# Patient Record
Sex: Female | Born: 1945 | Race: White | Hispanic: No | Smoking: Former smoker
Health system: Southern US, Community
[De-identification: ages and names within clinical notes are randomized; demographics above are authoritative.]

## PROBLEM LIST (undated history)

## (undated) DIAGNOSIS — F419 Anxiety disorder, unspecified: Secondary | ICD-10-CM

## (undated) DIAGNOSIS — Z860100 Personal history of colon polyps, unspecified: Secondary | ICD-10-CM

## (undated) DIAGNOSIS — E119 Type 2 diabetes mellitus without complications: Secondary | ICD-10-CM

## (undated) DIAGNOSIS — J449 Chronic obstructive pulmonary disease, unspecified: Secondary | ICD-10-CM

## (undated) DIAGNOSIS — F329 Major depressive disorder, single episode, unspecified: Secondary | ICD-10-CM

## (undated) DIAGNOSIS — R5383 Other fatigue: Secondary | ICD-10-CM

## (undated) DIAGNOSIS — E039 Hypothyroidism, unspecified: Secondary | ICD-10-CM

## (undated) DIAGNOSIS — R9439 Abnormal result of other cardiovascular function study: Secondary | ICD-10-CM

## (undated) DIAGNOSIS — I10 Essential (primary) hypertension: Secondary | ICD-10-CM

## (undated) DIAGNOSIS — R079 Chest pain, unspecified: Secondary | ICD-10-CM

## (undated) DIAGNOSIS — G459 Transient cerebral ischemic attack, unspecified: Secondary | ICD-10-CM

## (undated) DIAGNOSIS — M1990 Unspecified osteoarthritis, unspecified site: Secondary | ICD-10-CM

## (undated) DIAGNOSIS — J45909 Unspecified asthma, uncomplicated: Secondary | ICD-10-CM

## (undated) DIAGNOSIS — D509 Iron deficiency anemia, unspecified: Secondary | ICD-10-CM

## (undated) DIAGNOSIS — E538 Deficiency of other specified B group vitamins: Secondary | ICD-10-CM

## (undated) DIAGNOSIS — E785 Hyperlipidemia, unspecified: Secondary | ICD-10-CM

## (undated) DIAGNOSIS — K219 Gastro-esophageal reflux disease without esophagitis: Secondary | ICD-10-CM

## (undated) DIAGNOSIS — J309 Allergic rhinitis, unspecified: Secondary | ICD-10-CM

## (undated) DIAGNOSIS — F32A Depression, unspecified: Secondary | ICD-10-CM

## (undated) DIAGNOSIS — J4489 Other specified chronic obstructive pulmonary disease: Secondary | ICD-10-CM

## (undated) DIAGNOSIS — R5381 Other malaise: Secondary | ICD-10-CM

## (undated) HISTORY — DX: Deficiency of other specified B group vitamins: E53.8

## (undated) HISTORY — DX: Personal history of colon polyps, unspecified: Z86.0100

## (undated) HISTORY — DX: Transient cerebral ischemic attack, unspecified: G45.9

## (undated) HISTORY — DX: Anxiety disorder, unspecified: F41.9

## (undated) HISTORY — DX: Other specified chronic obstructive pulmonary disease: J44.89

## (undated) HISTORY — DX: Chronic obstructive pulmonary disease, unspecified: J44.9

## (undated) HISTORY — DX: Gastro-esophageal reflux disease without esophagitis: K21.9

## (undated) HISTORY — DX: Essential (primary) hypertension: I10

## (undated) HISTORY — DX: Hypothyroidism, unspecified: E03.9

## (undated) HISTORY — DX: Unspecified osteoarthritis, unspecified site: M19.90

## (undated) HISTORY — DX: Chest pain, unspecified: R07.9

## (undated) HISTORY — PX: FOOT SURGERY: SHX648

## (undated) HISTORY — DX: Hyperlipidemia, unspecified: E78.5

## (undated) HISTORY — DX: Depression, unspecified: F32.A

## (undated) HISTORY — DX: Type 2 diabetes mellitus without complications: E11.9

## (undated) HISTORY — DX: Personal history of colonic polyps: Z86.010

## (undated) HISTORY — DX: Other malaise: R53.81

## (undated) HISTORY — DX: Abnormal result of other cardiovascular function study: R94.39

## (undated) HISTORY — DX: Allergic rhinitis, unspecified: J30.9

## (undated) HISTORY — DX: Iron deficiency anemia, unspecified: D50.9

## (undated) HISTORY — DX: Other fatigue: R53.83

## (undated) HISTORY — DX: Unspecified asthma, uncomplicated: J45.909

## (undated) HISTORY — DX: Major depressive disorder, single episode, unspecified: F32.9

---

## 1980-11-30 HISTORY — PX: ABDOMINAL HYSTERECTOMY: SHX81

## 1998-11-07 ENCOUNTER — Ambulatory Visit (HOSPITAL_COMMUNITY): Admission: RE | Admit: 1998-11-07 | Discharge: 1998-11-07 | Payer: Self-pay | Admitting: Orthopedic Surgery

## 1998-11-07 ENCOUNTER — Encounter: Payer: Self-pay | Admitting: Orthopedic Surgery

## 1999-01-04 ENCOUNTER — Encounter: Payer: Self-pay | Admitting: Family Medicine

## 1999-01-04 ENCOUNTER — Ambulatory Visit: Admission: RE | Admit: 1999-01-04 | Discharge: 1999-01-04 | Payer: Self-pay | Admitting: Family Medicine

## 2000-03-22 ENCOUNTER — Ambulatory Visit (HOSPITAL_BASED_OUTPATIENT_CLINIC_OR_DEPARTMENT_OTHER): Admission: RE | Admit: 2000-03-22 | Discharge: 2000-03-22 | Payer: Self-pay | Admitting: Orthopedic Surgery

## 2001-01-25 ENCOUNTER — Ambulatory Visit (HOSPITAL_COMMUNITY): Admission: RE | Admit: 2001-01-25 | Discharge: 2001-01-25 | Payer: Self-pay

## 2002-06-05 ENCOUNTER — Encounter: Admission: RE | Admit: 2002-06-05 | Discharge: 2002-09-03 | Payer: Self-pay | Admitting: Unknown Physician Specialty

## 2002-09-16 ENCOUNTER — Emergency Department (HOSPITAL_COMMUNITY): Admission: EM | Admit: 2002-09-16 | Discharge: 2002-09-16 | Payer: Self-pay | Admitting: Emergency Medicine

## 2003-04-14 ENCOUNTER — Ambulatory Visit (HOSPITAL_COMMUNITY): Admission: RE | Admit: 2003-04-14 | Discharge: 2003-04-14 | Payer: Self-pay | Admitting: Family Medicine

## 2003-04-14 ENCOUNTER — Encounter: Payer: Self-pay | Admitting: Family Medicine

## 2003-08-04 ENCOUNTER — Ambulatory Visit (HOSPITAL_COMMUNITY): Admission: RE | Admit: 2003-08-04 | Discharge: 2003-08-04 | Payer: Self-pay | Admitting: Internal Medicine

## 2003-08-04 ENCOUNTER — Encounter: Payer: Self-pay | Admitting: Internal Medicine

## 2004-01-02 ENCOUNTER — Ambulatory Visit (HOSPITAL_COMMUNITY): Admission: RE | Admit: 2004-01-02 | Discharge: 2004-01-02 | Payer: Self-pay | Admitting: Family Medicine

## 2004-01-28 ENCOUNTER — Ambulatory Visit (HOSPITAL_BASED_OUTPATIENT_CLINIC_OR_DEPARTMENT_OTHER): Admission: RE | Admit: 2004-01-28 | Discharge: 2004-01-28 | Payer: Self-pay | Admitting: Orthopedic Surgery

## 2004-01-28 ENCOUNTER — Ambulatory Visit (HOSPITAL_COMMUNITY): Admission: RE | Admit: 2004-01-28 | Discharge: 2004-01-28 | Payer: Self-pay | Admitting: Orthopedic Surgery

## 2004-01-31 ENCOUNTER — Inpatient Hospital Stay (HOSPITAL_COMMUNITY): Admission: EM | Admit: 2004-01-31 | Discharge: 2004-02-09 | Payer: Self-pay | Admitting: Emergency Medicine

## 2004-02-04 ENCOUNTER — Encounter: Payer: Self-pay | Admitting: Internal Medicine

## 2004-02-06 ENCOUNTER — Encounter: Payer: Self-pay | Admitting: Internal Medicine

## 2004-08-05 ENCOUNTER — Encounter: Admission: RE | Admit: 2004-08-05 | Discharge: 2004-08-05 | Payer: Self-pay | Admitting: Allergy and Immunology

## 2004-08-19 ENCOUNTER — Ambulatory Visit: Payer: Self-pay | Admitting: Nurse Practitioner

## 2004-09-04 ENCOUNTER — Ambulatory Visit: Payer: Self-pay | Admitting: *Deleted

## 2004-09-07 ENCOUNTER — Emergency Department (HOSPITAL_COMMUNITY): Admission: EM | Admit: 2004-09-07 | Discharge: 2004-09-07 | Payer: Self-pay

## 2004-09-20 ENCOUNTER — Emergency Department (HOSPITAL_COMMUNITY): Admission: EM | Admit: 2004-09-20 | Discharge: 2004-09-20 | Payer: Self-pay | Admitting: Emergency Medicine

## 2004-09-22 ENCOUNTER — Ambulatory Visit: Payer: Self-pay | Admitting: Nurse Practitioner

## 2004-10-19 ENCOUNTER — Emergency Department (HOSPITAL_COMMUNITY): Admission: EM | Admit: 2004-10-19 | Discharge: 2004-10-20 | Payer: Self-pay

## 2004-11-14 ENCOUNTER — Ambulatory Visit (HOSPITAL_COMMUNITY): Admission: RE | Admit: 2004-11-14 | Discharge: 2004-11-14 | Payer: Self-pay | Admitting: Cardiology

## 2005-02-17 ENCOUNTER — Ambulatory Visit: Payer: Self-pay | Admitting: Nurse Practitioner

## 2005-03-18 ENCOUNTER — Ambulatory Visit: Payer: Self-pay | Admitting: Nurse Practitioner

## 2005-05-25 ENCOUNTER — Ambulatory Visit: Payer: Self-pay | Admitting: Nurse Practitioner

## 2005-05-26 ENCOUNTER — Ambulatory Visit (HOSPITAL_COMMUNITY): Admission: RE | Admit: 2005-05-26 | Discharge: 2005-05-26 | Payer: Self-pay | Admitting: Internal Medicine

## 2005-06-05 ENCOUNTER — Ambulatory Visit: Payer: Self-pay | Admitting: Nurse Practitioner

## 2005-08-06 ENCOUNTER — Ambulatory Visit: Payer: Self-pay | Admitting: Nurse Practitioner

## 2005-09-03 ENCOUNTER — Ambulatory Visit: Payer: Self-pay | Admitting: Nurse Practitioner

## 2005-09-29 ENCOUNTER — Ambulatory Visit: Payer: Self-pay | Admitting: Nurse Practitioner

## 2005-10-01 ENCOUNTER — Ambulatory Visit: Payer: Self-pay | Admitting: Nurse Practitioner

## 2005-10-02 ENCOUNTER — Ambulatory Visit (HOSPITAL_COMMUNITY): Admission: RE | Admit: 2005-10-02 | Discharge: 2005-10-02 | Payer: Self-pay | Admitting: Nurse Practitioner

## 2005-10-26 ENCOUNTER — Ambulatory Visit: Payer: Self-pay | Admitting: Family Medicine

## 2005-11-05 ENCOUNTER — Emergency Department (HOSPITAL_COMMUNITY): Admission: EM | Admit: 2005-11-05 | Discharge: 2005-11-05 | Payer: Self-pay | Admitting: Family Medicine

## 2005-11-17 ENCOUNTER — Ambulatory Visit: Payer: Self-pay | Admitting: Internal Medicine

## 2005-12-02 ENCOUNTER — Ambulatory Visit: Payer: Self-pay | Admitting: Nurse Practitioner

## 2005-12-29 ENCOUNTER — Ambulatory Visit: Payer: Self-pay | Admitting: Cardiology

## 2005-12-31 ENCOUNTER — Ambulatory Visit: Payer: Self-pay

## 2006-01-11 ENCOUNTER — Ambulatory Visit: Payer: Self-pay | Admitting: Nurse Practitioner

## 2006-01-20 ENCOUNTER — Ambulatory Visit: Payer: Self-pay | Admitting: Nurse Practitioner

## 2006-03-01 ENCOUNTER — Ambulatory Visit: Payer: Self-pay | Admitting: Nurse Practitioner

## 2006-04-19 ENCOUNTER — Ambulatory Visit: Payer: Self-pay | Admitting: Nurse Practitioner

## 2006-05-21 ENCOUNTER — Ambulatory Visit: Payer: Self-pay | Admitting: Nurse Practitioner

## 2006-06-25 ENCOUNTER — Ambulatory Visit: Payer: Self-pay | Admitting: Nurse Practitioner

## 2006-08-19 ENCOUNTER — Ambulatory Visit: Payer: Self-pay | Admitting: Nurse Practitioner

## 2006-08-24 ENCOUNTER — Ambulatory Visit (HOSPITAL_COMMUNITY): Admission: RE | Admit: 2006-08-24 | Discharge: 2006-08-24 | Payer: Self-pay | Admitting: Family Medicine

## 2006-09-02 ENCOUNTER — Ambulatory Visit: Payer: Self-pay | Admitting: Nurse Practitioner

## 2006-10-05 ENCOUNTER — Ambulatory Visit: Payer: Self-pay | Admitting: Internal Medicine

## 2006-11-02 ENCOUNTER — Ambulatory Visit: Payer: Self-pay | Admitting: Nurse Practitioner

## 2006-12-29 ENCOUNTER — Ambulatory Visit: Payer: Self-pay | Admitting: Nurse Practitioner

## 2007-01-06 ENCOUNTER — Ambulatory Visit: Payer: Self-pay | Admitting: Nurse Practitioner

## 2007-01-27 ENCOUNTER — Encounter (INDEPENDENT_AMBULATORY_CARE_PROVIDER_SITE_OTHER): Payer: Self-pay | Admitting: Nurse Practitioner

## 2007-01-27 ENCOUNTER — Ambulatory Visit: Payer: Self-pay | Admitting: Family Medicine

## 2007-01-27 ENCOUNTER — Other Ambulatory Visit: Admission: RE | Admit: 2007-01-27 | Discharge: 2007-01-27 | Payer: Self-pay | Admitting: Family Medicine

## 2007-05-17 ENCOUNTER — Ambulatory Visit: Payer: Self-pay | Admitting: Internal Medicine

## 2007-06-30 ENCOUNTER — Ambulatory Visit (HOSPITAL_COMMUNITY): Admission: RE | Admit: 2007-06-30 | Discharge: 2007-06-30 | Payer: Self-pay | Admitting: Internal Medicine

## 2007-06-30 ENCOUNTER — Encounter: Payer: Self-pay | Admitting: Internal Medicine

## 2007-07-13 ENCOUNTER — Ambulatory Visit: Payer: Self-pay | Admitting: Family Medicine

## 2007-07-13 ENCOUNTER — Encounter (INDEPENDENT_AMBULATORY_CARE_PROVIDER_SITE_OTHER): Payer: Self-pay | Admitting: Nurse Practitioner

## 2007-07-13 LAB — CONVERTED CEMR LAB
ALT: 16 units/L (ref 0–35)
AST: 20 units/L (ref 0–37)
Basophils Absolute: 0.1 10*3/uL (ref 0.0–0.1)
Basophils Relative: 1 % (ref 0–1)
Chloride: 101 meq/L (ref 96–112)
Creatinine, Ser: 1.08 mg/dL (ref 0.40–1.20)
Eosinophils Relative: 1 % (ref 0–5)
Hemoglobin: 14 g/dL (ref 12.0–15.0)
MCHC: 32.3 g/dL (ref 30.0–36.0)
Monocytes Absolute: 0.6 10*3/uL (ref 0.2–0.7)
Neutro Abs: 4.3 10*3/uL (ref 1.7–7.7)
Platelets: 295 10*3/uL (ref 150–400)
RDW: 13 % (ref 11.5–14.0)
Sodium: 141 meq/L (ref 135–145)
Total Bilirubin: 0.5 mg/dL (ref 0.3–1.2)
Total Protein: 6.8 g/dL (ref 6.0–8.3)

## 2007-07-18 ENCOUNTER — Ambulatory Visit (HOSPITAL_COMMUNITY): Admission: RE | Admit: 2007-07-18 | Discharge: 2007-07-18 | Payer: Self-pay | Admitting: Nurse Practitioner

## 2007-07-28 ENCOUNTER — Ambulatory Visit (HOSPITAL_COMMUNITY): Admission: RE | Admit: 2007-07-28 | Discharge: 2007-07-28 | Payer: Self-pay | Admitting: Internal Medicine

## 2007-07-28 ENCOUNTER — Ambulatory Visit: Payer: Self-pay | Admitting: Internal Medicine

## 2007-08-03 ENCOUNTER — Emergency Department (HOSPITAL_COMMUNITY): Admission: EM | Admit: 2007-08-03 | Discharge: 2007-08-03 | Payer: Self-pay | Admitting: Emergency Medicine

## 2007-08-05 ENCOUNTER — Emergency Department (HOSPITAL_COMMUNITY): Admission: EM | Admit: 2007-08-05 | Discharge: 2007-08-05 | Payer: Self-pay | Admitting: Emergency Medicine

## 2007-08-12 ENCOUNTER — Ambulatory Visit: Payer: Self-pay | Admitting: Internal Medicine

## 2007-08-18 ENCOUNTER — Emergency Department (HOSPITAL_COMMUNITY): Admission: EM | Admit: 2007-08-18 | Discharge: 2007-08-18 | Payer: Self-pay | Admitting: Emergency Medicine

## 2007-09-02 ENCOUNTER — Emergency Department (HOSPITAL_COMMUNITY): Admission: EM | Admit: 2007-09-02 | Discharge: 2007-09-02 | Payer: Self-pay | Admitting: Family Medicine

## 2007-09-12 ENCOUNTER — Ambulatory Visit: Payer: Self-pay | Admitting: Family Medicine

## 2007-09-14 ENCOUNTER — Ambulatory Visit: Payer: Self-pay | Admitting: Internal Medicine

## 2007-10-03 ENCOUNTER — Ambulatory Visit: Payer: Self-pay | Admitting: Internal Medicine

## 2007-10-12 ENCOUNTER — Ambulatory Visit: Payer: Self-pay | Admitting: Internal Medicine

## 2007-10-17 ENCOUNTER — Telehealth (INDEPENDENT_AMBULATORY_CARE_PROVIDER_SITE_OTHER): Payer: Self-pay | Admitting: *Deleted

## 2007-10-18 ENCOUNTER — Ambulatory Visit: Payer: Self-pay | Admitting: Internal Medicine

## 2007-10-20 ENCOUNTER — Ambulatory Visit: Payer: Self-pay | Admitting: Internal Medicine

## 2007-10-21 ENCOUNTER — Ambulatory Visit (HOSPITAL_COMMUNITY): Admission: RE | Admit: 2007-10-21 | Discharge: 2007-10-21 | Payer: Self-pay | Admitting: Internal Medicine

## 2007-11-02 DIAGNOSIS — E1165 Type 2 diabetes mellitus with hyperglycemia: Secondary | ICD-10-CM

## 2007-11-02 DIAGNOSIS — Z9189 Other specified personal risk factors, not elsewhere classified: Secondary | ICD-10-CM

## 2007-11-02 DIAGNOSIS — E039 Hypothyroidism, unspecified: Secondary | ICD-10-CM

## 2007-11-02 DIAGNOSIS — E1129 Type 2 diabetes mellitus with other diabetic kidney complication: Secondary | ICD-10-CM

## 2007-11-02 DIAGNOSIS — J45909 Unspecified asthma, uncomplicated: Secondary | ICD-10-CM | POA: Insufficient documentation

## 2007-11-02 DIAGNOSIS — I10 Essential (primary) hypertension: Secondary | ICD-10-CM

## 2007-11-02 DIAGNOSIS — J309 Allergic rhinitis, unspecified: Secondary | ICD-10-CM | POA: Insufficient documentation

## 2007-11-02 DIAGNOSIS — E785 Hyperlipidemia, unspecified: Secondary | ICD-10-CM | POA: Insufficient documentation

## 2007-11-02 DIAGNOSIS — Z9079 Acquired absence of other genital organ(s): Secondary | ICD-10-CM | POA: Insufficient documentation

## 2007-11-03 ENCOUNTER — Ambulatory Visit: Payer: Self-pay | Admitting: Internal Medicine

## 2007-11-17 ENCOUNTER — Ambulatory Visit: Payer: Self-pay | Admitting: Internal Medicine

## 2007-11-17 DIAGNOSIS — J4489 Other specified chronic obstructive pulmonary disease: Secondary | ICD-10-CM | POA: Insufficient documentation

## 2007-11-17 DIAGNOSIS — J449 Chronic obstructive pulmonary disease, unspecified: Secondary | ICD-10-CM

## 2007-12-06 ENCOUNTER — Telehealth (INDEPENDENT_AMBULATORY_CARE_PROVIDER_SITE_OTHER): Payer: Self-pay | Admitting: *Deleted

## 2007-12-08 ENCOUNTER — Telehealth: Payer: Self-pay | Admitting: Internal Medicine

## 2007-12-09 ENCOUNTER — Ambulatory Visit: Payer: Self-pay | Admitting: Internal Medicine

## 2007-12-13 ENCOUNTER — Encounter: Admission: RE | Admit: 2007-12-13 | Discharge: 2007-12-13 | Payer: Self-pay | Admitting: Internal Medicine

## 2007-12-19 ENCOUNTER — Telehealth: Payer: Self-pay | Admitting: Internal Medicine

## 2008-02-01 ENCOUNTER — Telehealth (INDEPENDENT_AMBULATORY_CARE_PROVIDER_SITE_OTHER): Payer: Self-pay | Admitting: *Deleted

## 2008-02-02 ENCOUNTER — Ambulatory Visit: Payer: Self-pay | Admitting: Internal Medicine

## 2008-02-03 ENCOUNTER — Ambulatory Visit (HOSPITAL_COMMUNITY): Admission: RE | Admit: 2008-02-03 | Discharge: 2008-02-03 | Payer: Self-pay | Admitting: Orthopedic Surgery

## 2008-02-16 ENCOUNTER — Encounter: Payer: Self-pay | Admitting: Internal Medicine

## 2008-02-20 ENCOUNTER — Ambulatory Visit: Payer: Self-pay | Admitting: Internal Medicine

## 2008-03-07 ENCOUNTER — Encounter: Payer: Self-pay | Admitting: Internal Medicine

## 2008-04-10 ENCOUNTER — Ambulatory Visit: Payer: Self-pay | Admitting: Internal Medicine

## 2008-04-10 LAB — CONVERTED CEMR LAB
Cholesterol: 214 mg/dL (ref 0–200)
Direct LDL: 124.1 mg/dL
Total CHOL/HDL Ratio: 6.6
Triglycerides: 298 mg/dL (ref 0–149)
VLDL: 60 mg/dL — ABNORMAL HIGH (ref 0–40)

## 2008-04-11 ENCOUNTER — Ambulatory Visit: Payer: Self-pay | Admitting: Internal Medicine

## 2008-04-11 DIAGNOSIS — M47817 Spondylosis without myelopathy or radiculopathy, lumbosacral region: Secondary | ICD-10-CM

## 2008-04-13 ENCOUNTER — Telehealth: Payer: Self-pay | Admitting: Internal Medicine

## 2008-04-15 ENCOUNTER — Encounter: Payer: Self-pay | Admitting: Internal Medicine

## 2008-04-19 ENCOUNTER — Telehealth: Payer: Self-pay | Admitting: Internal Medicine

## 2008-04-24 ENCOUNTER — Telehealth: Payer: Self-pay | Admitting: Internal Medicine

## 2008-05-02 ENCOUNTER — Encounter: Payer: Self-pay | Admitting: Internal Medicine

## 2008-05-07 ENCOUNTER — Telehealth: Payer: Self-pay | Admitting: Internal Medicine

## 2008-05-09 ENCOUNTER — Telehealth: Payer: Self-pay | Admitting: Internal Medicine

## 2008-05-10 ENCOUNTER — Encounter: Payer: Self-pay | Admitting: Internal Medicine

## 2008-05-11 ENCOUNTER — Encounter: Payer: Self-pay | Admitting: Internal Medicine

## 2008-05-15 ENCOUNTER — Ambulatory Visit: Payer: Self-pay | Admitting: Internal Medicine

## 2008-05-15 DIAGNOSIS — E538 Deficiency of other specified B group vitamins: Secondary | ICD-10-CM | POA: Insufficient documentation

## 2008-05-16 ENCOUNTER — Telehealth: Payer: Self-pay | Admitting: Internal Medicine

## 2008-05-16 ENCOUNTER — Ambulatory Visit: Payer: Self-pay | Admitting: Internal Medicine

## 2008-05-18 ENCOUNTER — Telehealth: Payer: Self-pay | Admitting: Internal Medicine

## 2008-05-23 ENCOUNTER — Ambulatory Visit: Payer: Self-pay | Admitting: Internal Medicine

## 2008-05-24 ENCOUNTER — Telehealth: Payer: Self-pay | Admitting: Adult Health

## 2008-05-25 ENCOUNTER — Encounter: Payer: Self-pay | Admitting: Adult Health

## 2008-05-29 ENCOUNTER — Emergency Department (HOSPITAL_COMMUNITY): Admission: EM | Admit: 2008-05-29 | Discharge: 2008-05-29 | Payer: Self-pay | Admitting: Family Medicine

## 2008-05-29 ENCOUNTER — Telehealth (INDEPENDENT_AMBULATORY_CARE_PROVIDER_SITE_OTHER): Payer: Self-pay | Admitting: *Deleted

## 2008-06-06 ENCOUNTER — Encounter: Payer: Self-pay | Admitting: Internal Medicine

## 2008-06-12 ENCOUNTER — Encounter: Payer: Self-pay | Admitting: Internal Medicine

## 2008-06-15 ENCOUNTER — Ambulatory Visit: Payer: Self-pay | Admitting: Internal Medicine

## 2008-06-27 ENCOUNTER — Telehealth (INDEPENDENT_AMBULATORY_CARE_PROVIDER_SITE_OTHER): Payer: Self-pay | Admitting: *Deleted

## 2008-06-29 ENCOUNTER — Telehealth: Payer: Self-pay | Admitting: Internal Medicine

## 2008-07-11 ENCOUNTER — Ambulatory Visit: Payer: Self-pay | Admitting: Internal Medicine

## 2008-07-12 ENCOUNTER — Ambulatory Visit: Payer: Self-pay | Admitting: Internal Medicine

## 2008-07-12 LAB — CONVERTED CEMR LAB
Basophils Absolute: 0.1 10*3/uL (ref 0.0–0.1)
Basophils Relative: 0.8 % (ref 0.0–3.0)
Eosinophils Absolute: 0.1 10*3/uL (ref 0.0–0.7)
HCT: 36.5 % (ref 36.0–46.0)
Hemoglobin: 12.8 g/dL (ref 12.0–15.0)
Lymphocytes Relative: 28 % (ref 12.0–46.0)
MCHC: 34.9 g/dL (ref 30.0–36.0)
MCV: 88.5 fL (ref 78.0–100.0)
Monocytes Absolute: 0.5 10*3/uL (ref 0.1–1.0)
Neutro Abs: 3.8 10*3/uL (ref 1.4–7.7)
RBC: 4.13 M/uL (ref 3.87–5.11)

## 2008-07-18 ENCOUNTER — Ambulatory Visit: Payer: Self-pay | Admitting: Internal Medicine

## 2008-07-19 ENCOUNTER — Telehealth (INDEPENDENT_AMBULATORY_CARE_PROVIDER_SITE_OTHER): Payer: Self-pay | Admitting: *Deleted

## 2008-07-25 ENCOUNTER — Telehealth: Payer: Self-pay | Admitting: Internal Medicine

## 2008-07-30 ENCOUNTER — Telehealth: Payer: Self-pay | Admitting: Internal Medicine

## 2008-08-02 ENCOUNTER — Telehealth (INDEPENDENT_AMBULATORY_CARE_PROVIDER_SITE_OTHER): Payer: Self-pay | Admitting: *Deleted

## 2008-08-07 ENCOUNTER — Telehealth (INDEPENDENT_AMBULATORY_CARE_PROVIDER_SITE_OTHER): Payer: Self-pay | Admitting: *Deleted

## 2008-08-13 ENCOUNTER — Telehealth: Payer: Self-pay | Admitting: Internal Medicine

## 2008-08-16 ENCOUNTER — Ambulatory Visit: Payer: Self-pay | Admitting: Internal Medicine

## 2008-08-16 ENCOUNTER — Observation Stay (HOSPITAL_COMMUNITY): Admission: EM | Admit: 2008-08-16 | Discharge: 2008-08-17 | Payer: Self-pay | Admitting: Emergency Medicine

## 2008-08-17 ENCOUNTER — Telehealth (INDEPENDENT_AMBULATORY_CARE_PROVIDER_SITE_OTHER): Payer: Self-pay | Admitting: *Deleted

## 2008-08-20 ENCOUNTER — Telehealth: Payer: Self-pay | Admitting: Internal Medicine

## 2008-08-21 ENCOUNTER — Encounter: Payer: Self-pay | Admitting: Internal Medicine

## 2008-08-21 ENCOUNTER — Ambulatory Visit: Payer: Self-pay

## 2008-08-21 DIAGNOSIS — R9439 Abnormal result of other cardiovascular function study: Secondary | ICD-10-CM

## 2008-08-21 HISTORY — DX: Abnormal result of other cardiovascular function study: R94.39

## 2008-08-22 ENCOUNTER — Ambulatory Visit: Payer: Self-pay

## 2008-08-23 ENCOUNTER — Ambulatory Visit: Payer: Self-pay | Admitting: Internal Medicine

## 2008-08-24 ENCOUNTER — Ambulatory Visit: Payer: Self-pay | Admitting: Internal Medicine

## 2008-08-24 DIAGNOSIS — R079 Chest pain, unspecified: Secondary | ICD-10-CM

## 2008-08-27 ENCOUNTER — Telehealth: Payer: Self-pay | Admitting: Internal Medicine

## 2008-08-28 ENCOUNTER — Telehealth: Payer: Self-pay | Admitting: Internal Medicine

## 2008-09-07 ENCOUNTER — Ambulatory Visit: Payer: Self-pay | Admitting: Internal Medicine

## 2008-09-07 LAB — CONVERTED CEMR LAB
BUN: 22 mg/dL (ref 6–23)
Creatinine, Ser: 1 mg/dL (ref 0.4–1.2)
GFR calc Af Amer: 72 mL/min
Glucose, Bld: 187 mg/dL — ABNORMAL HIGH (ref 70–99)
Potassium: 4 meq/L (ref 3.5–5.1)

## 2008-09-08 DIAGNOSIS — F341 Dysthymic disorder: Secondary | ICD-10-CM

## 2008-09-10 ENCOUNTER — Encounter: Payer: Self-pay | Admitting: Internal Medicine

## 2008-09-12 ENCOUNTER — Encounter: Payer: Self-pay | Admitting: Internal Medicine

## 2008-09-13 ENCOUNTER — Ambulatory Visit: Payer: Self-pay | Admitting: Internal Medicine

## 2008-09-14 ENCOUNTER — Telehealth: Payer: Self-pay | Admitting: Internal Medicine

## 2008-09-17 ENCOUNTER — Ambulatory Visit: Payer: Self-pay | Admitting: Internal Medicine

## 2008-09-18 ENCOUNTER — Encounter (HOSPITAL_COMMUNITY): Admission: RE | Admit: 2008-09-18 | Discharge: 2008-11-29 | Payer: Self-pay | Admitting: Internal Medicine

## 2008-09-21 ENCOUNTER — Ambulatory Visit: Payer: Self-pay | Admitting: Internal Medicine

## 2008-09-25 ENCOUNTER — Ambulatory Visit: Payer: Self-pay | Admitting: Internal Medicine

## 2008-09-26 ENCOUNTER — Telehealth: Payer: Self-pay | Admitting: Internal Medicine

## 2008-09-28 ENCOUNTER — Ambulatory Visit: Payer: Self-pay | Admitting: Internal Medicine

## 2008-10-01 ENCOUNTER — Telehealth: Payer: Self-pay | Admitting: Internal Medicine

## 2008-10-02 ENCOUNTER — Ambulatory Visit: Payer: Self-pay | Admitting: Internal Medicine

## 2008-10-05 ENCOUNTER — Ambulatory Visit: Payer: Self-pay | Admitting: Internal Medicine

## 2008-10-09 ENCOUNTER — Ambulatory Visit: Payer: Self-pay | Admitting: Internal Medicine

## 2008-10-14 ENCOUNTER — Encounter: Payer: Self-pay | Admitting: Internal Medicine

## 2008-10-16 ENCOUNTER — Ambulatory Visit: Payer: Self-pay | Admitting: Internal Medicine

## 2008-10-17 ENCOUNTER — Ambulatory Visit: Payer: Self-pay | Admitting: Internal Medicine

## 2008-10-19 ENCOUNTER — Ambulatory Visit: Payer: Self-pay | Admitting: Internal Medicine

## 2008-10-22 ENCOUNTER — Telehealth: Payer: Self-pay | Admitting: Internal Medicine

## 2008-10-22 ENCOUNTER — Ambulatory Visit: Payer: Self-pay | Admitting: Internal Medicine

## 2008-10-23 ENCOUNTER — Ambulatory Visit: Payer: Self-pay | Admitting: Internal Medicine

## 2008-10-24 ENCOUNTER — Telehealth: Payer: Self-pay | Admitting: Internal Medicine

## 2008-10-26 ENCOUNTER — Telehealth (INDEPENDENT_AMBULATORY_CARE_PROVIDER_SITE_OTHER): Payer: Self-pay | Admitting: *Deleted

## 2008-10-26 ENCOUNTER — Ambulatory Visit: Payer: Self-pay | Admitting: Internal Medicine

## 2008-10-30 ENCOUNTER — Ambulatory Visit: Payer: Self-pay | Admitting: Internal Medicine

## 2008-10-31 ENCOUNTER — Ambulatory Visit: Payer: Self-pay | Admitting: Internal Medicine

## 2008-11-02 ENCOUNTER — Ambulatory Visit: Payer: Self-pay | Admitting: Internal Medicine

## 2008-11-05 ENCOUNTER — Ambulatory Visit: Payer: Self-pay | Admitting: Internal Medicine

## 2008-11-06 ENCOUNTER — Ambulatory Visit: Payer: Self-pay | Admitting: Internal Medicine

## 2008-11-07 ENCOUNTER — Telehealth: Payer: Self-pay | Admitting: Internal Medicine

## 2008-11-09 ENCOUNTER — Ambulatory Visit: Payer: Self-pay | Admitting: Internal Medicine

## 2008-11-11 ENCOUNTER — Emergency Department (HOSPITAL_COMMUNITY): Admission: EM | Admit: 2008-11-11 | Discharge: 2008-11-11 | Payer: Self-pay | Admitting: Emergency Medicine

## 2008-11-12 ENCOUNTER — Telehealth: Payer: Self-pay | Admitting: Internal Medicine

## 2008-11-13 ENCOUNTER — Inpatient Hospital Stay (HOSPITAL_COMMUNITY): Admission: EM | Admit: 2008-11-13 | Discharge: 2008-11-16 | Payer: Self-pay | Admitting: Emergency Medicine

## 2008-11-13 ENCOUNTER — Ambulatory Visit: Payer: Self-pay | Admitting: Internal Medicine

## 2008-11-15 ENCOUNTER — Telehealth: Payer: Self-pay | Admitting: Internal Medicine

## 2008-11-19 ENCOUNTER — Telehealth: Payer: Self-pay | Admitting: Internal Medicine

## 2008-11-20 ENCOUNTER — Ambulatory Visit: Payer: Self-pay | Admitting: Internal Medicine

## 2008-11-27 ENCOUNTER — Ambulatory Visit: Payer: Self-pay | Admitting: Internal Medicine

## 2008-11-29 ENCOUNTER — Ambulatory Visit: Payer: Self-pay | Admitting: Internal Medicine

## 2008-11-30 ENCOUNTER — Encounter (HOSPITAL_COMMUNITY): Admission: RE | Admit: 2008-11-30 | Discharge: 2009-01-16 | Payer: Self-pay | Admitting: Internal Medicine

## 2008-12-03 ENCOUNTER — Ambulatory Visit: Payer: Self-pay | Admitting: Internal Medicine

## 2008-12-04 ENCOUNTER — Ambulatory Visit: Payer: Self-pay | Admitting: Cardiology

## 2008-12-06 ENCOUNTER — Ambulatory Visit: Payer: Self-pay | Admitting: Internal Medicine

## 2008-12-06 ENCOUNTER — Telehealth: Payer: Self-pay | Admitting: Internal Medicine

## 2008-12-10 ENCOUNTER — Encounter: Payer: Self-pay | Admitting: Internal Medicine

## 2008-12-11 ENCOUNTER — Telehealth: Payer: Self-pay | Admitting: Internal Medicine

## 2008-12-11 ENCOUNTER — Ambulatory Visit: Payer: Self-pay | Admitting: Internal Medicine

## 2008-12-12 ENCOUNTER — Encounter: Payer: Self-pay | Admitting: Internal Medicine

## 2008-12-14 ENCOUNTER — Ambulatory Visit: Payer: Self-pay | Admitting: Internal Medicine

## 2008-12-18 ENCOUNTER — Encounter: Payer: Self-pay | Admitting: Internal Medicine

## 2008-12-18 ENCOUNTER — Ambulatory Visit: Payer: Self-pay | Admitting: Internal Medicine

## 2008-12-25 ENCOUNTER — Ambulatory Visit: Payer: Self-pay | Admitting: Internal Medicine

## 2008-12-27 ENCOUNTER — Ambulatory Visit: Payer: Self-pay | Admitting: Internal Medicine

## 2008-12-27 DIAGNOSIS — R5383 Other fatigue: Secondary | ICD-10-CM

## 2008-12-27 DIAGNOSIS — R5381 Other malaise: Secondary | ICD-10-CM | POA: Insufficient documentation

## 2008-12-27 LAB — CONVERTED CEMR LAB
Basophils Absolute: 0 10*3/uL (ref 0.0–0.1)
Basophils Relative: 0.5 % (ref 0.0–3.0)
CO2: 30 meq/L (ref 19–32)
Chloride: 102 meq/L (ref 96–112)
Cholesterol: 142 mg/dL (ref 0–200)
Hgb A1c MFr Bld: 7.6 % — ABNORMAL HIGH (ref 4.6–6.0)
LDL Cholesterol: 70 mg/dL (ref 0–99)
Lymphocytes Relative: 27.6 % (ref 12.0–46.0)
MCHC: 34 g/dL (ref 30.0–36.0)
Neutrophils Relative %: 63.5 % (ref 43.0–77.0)
RBC: 4.19 M/uL (ref 3.87–5.11)
RDW: 13.4 % (ref 11.5–14.6)
Sodium: 141 meq/L (ref 135–145)
TSH: 6.65 microintl units/mL — ABNORMAL HIGH (ref 0.35–5.50)
VLDL: 33 mg/dL (ref 0–40)

## 2009-01-01 ENCOUNTER — Ambulatory Visit: Payer: Self-pay | Admitting: Internal Medicine

## 2009-01-02 ENCOUNTER — Telehealth: Payer: Self-pay | Admitting: Internal Medicine

## 2009-01-04 ENCOUNTER — Ambulatory Visit: Payer: Self-pay | Admitting: Internal Medicine

## 2009-01-10 ENCOUNTER — Encounter: Payer: Self-pay | Admitting: Internal Medicine

## 2009-01-10 ENCOUNTER — Ambulatory Visit: Payer: Self-pay | Admitting: Internal Medicine

## 2009-01-14 ENCOUNTER — Ambulatory Visit: Payer: Self-pay | Admitting: Internal Medicine

## 2009-01-15 ENCOUNTER — Ambulatory Visit: Payer: Self-pay | Admitting: Internal Medicine

## 2009-01-16 ENCOUNTER — Encounter (HOSPITAL_COMMUNITY): Admission: RE | Admit: 2009-01-16 | Discharge: 2009-04-16 | Payer: Self-pay | Admitting: Internal Medicine

## 2009-01-18 ENCOUNTER — Ambulatory Visit: Payer: Self-pay | Admitting: Internal Medicine

## 2009-01-22 ENCOUNTER — Ambulatory Visit: Payer: Self-pay | Admitting: Internal Medicine

## 2009-01-24 ENCOUNTER — Ambulatory Visit: Payer: Self-pay | Admitting: Internal Medicine

## 2009-01-29 ENCOUNTER — Ambulatory Visit: Payer: Self-pay | Admitting: Internal Medicine

## 2009-02-05 ENCOUNTER — Ambulatory Visit: Payer: Self-pay | Admitting: Internal Medicine

## 2009-02-08 ENCOUNTER — Encounter: Payer: Self-pay | Admitting: Internal Medicine

## 2009-02-12 ENCOUNTER — Ambulatory Visit: Payer: Self-pay | Admitting: Internal Medicine

## 2009-02-13 ENCOUNTER — Encounter: Payer: Self-pay | Admitting: Internal Medicine

## 2009-02-14 ENCOUNTER — Emergency Department (HOSPITAL_COMMUNITY): Admission: EM | Admit: 2009-02-14 | Discharge: 2009-02-14 | Payer: Self-pay | Admitting: Emergency Medicine

## 2009-02-14 ENCOUNTER — Telehealth (INDEPENDENT_AMBULATORY_CARE_PROVIDER_SITE_OTHER): Payer: Self-pay | Admitting: *Deleted

## 2009-02-19 ENCOUNTER — Ambulatory Visit: Payer: Self-pay | Admitting: Internal Medicine

## 2009-02-26 ENCOUNTER — Encounter: Payer: Self-pay | Admitting: Internal Medicine

## 2009-02-26 ENCOUNTER — Ambulatory Visit: Payer: Self-pay | Admitting: Internal Medicine

## 2009-03-05 ENCOUNTER — Ambulatory Visit (HOSPITAL_BASED_OUTPATIENT_CLINIC_OR_DEPARTMENT_OTHER): Admission: RE | Admit: 2009-03-05 | Discharge: 2009-03-05 | Payer: Self-pay | Admitting: Orthopedic Surgery

## 2009-03-05 HISTORY — PX: CARPAL TUNNEL RELEASE: SHX101

## 2009-03-12 ENCOUNTER — Ambulatory Visit: Payer: Self-pay | Admitting: Internal Medicine

## 2009-03-19 ENCOUNTER — Encounter: Payer: Self-pay | Admitting: Internal Medicine

## 2009-03-20 ENCOUNTER — Ambulatory Visit: Payer: Self-pay | Admitting: Internal Medicine

## 2009-04-02 ENCOUNTER — Ambulatory Visit: Payer: Self-pay | Admitting: Internal Medicine

## 2009-04-08 ENCOUNTER — Telehealth: Payer: Self-pay | Admitting: Internal Medicine

## 2009-04-08 ENCOUNTER — Telehealth (INDEPENDENT_AMBULATORY_CARE_PROVIDER_SITE_OTHER): Payer: Self-pay | Admitting: *Deleted

## 2009-04-10 ENCOUNTER — Ambulatory Visit: Payer: Self-pay | Admitting: Internal Medicine

## 2009-04-15 ENCOUNTER — Ambulatory Visit: Payer: Self-pay | Admitting: Internal Medicine

## 2009-04-17 ENCOUNTER — Ambulatory Visit: Payer: Self-pay | Admitting: Internal Medicine

## 2009-04-19 LAB — CONVERTED CEMR LAB
Basophils Absolute: 0 10*3/uL (ref 0.0–0.1)
Calcium: 9.3 mg/dL (ref 8.4–10.5)
Cholesterol: 145 mg/dL (ref 0–200)
Creatinine, Ser: 0.8 mg/dL (ref 0.4–1.2)
GFR calc non Af Amer: 76.46 mL/min (ref >60)
Glucose, Bld: 140 mg/dL — ABNORMAL HIGH (ref 70–99)
HCT: 33.9 % — ABNORMAL LOW (ref 36.0–46.0)
HDL: 36.6 mg/dL — ABNORMAL LOW (ref >39.00)
Lymphs Abs: 2.4 10*3/uL (ref 0.7–4.0)
MCV: 82.9 fL (ref 78.0–100.0)
Monocytes Absolute: 0.6 10*3/uL (ref 0.1–1.0)
Platelets: 277 10*3/uL (ref 150.0–400.0)
RDW: 14.3 % (ref 11.5–14.6)
Sodium: 142 meq/L (ref 135–145)
Triglycerides: 191 mg/dL — ABNORMAL HIGH (ref 0.0–149.0)

## 2009-04-22 ENCOUNTER — Ambulatory Visit: Payer: Self-pay | Admitting: Internal Medicine

## 2009-04-22 ENCOUNTER — Telehealth: Payer: Self-pay | Admitting: Internal Medicine

## 2009-04-22 DIAGNOSIS — R42 Dizziness and giddiness: Secondary | ICD-10-CM

## 2009-04-23 ENCOUNTER — Ambulatory Visit: Payer: Self-pay | Admitting: Internal Medicine

## 2009-04-23 DIAGNOSIS — D509 Iron deficiency anemia, unspecified: Secondary | ICD-10-CM

## 2009-04-23 LAB — CONVERTED CEMR LAB
BUN: 14 mg/dL (ref 6–23)
Basophils Absolute: 0 10*3/uL (ref 0.0–0.1)
Bilirubin Urine: NEGATIVE
Bilirubin, Direct: 0.1 mg/dL (ref 0.0–0.3)
CO2: 31 meq/L (ref 19–32)
Calcium: 9.7 mg/dL (ref 8.4–10.5)
Creatinine, Ser: 0.9 mg/dL (ref 0.4–1.2)
Eosinophils Absolute: 0.1 10*3/uL (ref 0.0–0.7)
Glucose, Bld: 105 mg/dL — ABNORMAL HIGH (ref 70–99)
Hemoglobin, Urine: NEGATIVE
Ketones, ur: NEGATIVE mg/dL
Lipase: 16 units/L (ref 11.0–59.0)
Lymphocytes Relative: 29.2 % (ref 12.0–46.0)
MCHC: 33.4 g/dL (ref 30.0–36.0)
MCV: 81.4 fL (ref 78.0–100.0)
Monocytes Absolute: 0.5 10*3/uL (ref 0.1–1.0)
Neutrophils Relative %: 63.9 % (ref 43.0–77.0)
Nitrite: NEGATIVE
Platelets: 296 10*3/uL (ref 150.0–400.0)
RBC: 4.04 M/uL (ref 3.87–5.11)
RDW: 14.2 % (ref 11.5–14.6)
Sed Rate: 27 mm/hr — ABNORMAL HIGH (ref 0–22)
Total Bilirubin: 0.5 mg/dL (ref 0.3–1.2)
Total Protein, Urine: NEGATIVE mg/dL

## 2009-04-24 ENCOUNTER — Encounter (INDEPENDENT_AMBULATORY_CARE_PROVIDER_SITE_OTHER): Payer: Self-pay | Admitting: *Deleted

## 2009-04-25 LAB — CONVERTED CEMR LAB
Folate: >20 ng/mL
Iron: 18 ug/dL — ABNORMAL LOW (ref 42–145)
Transferrin: 314.1 mg/dL (ref 212.0–360.0)
Vitamin B-12: 322 pg/mL (ref 211–911)

## 2009-04-26 ENCOUNTER — Telehealth: Payer: Self-pay | Admitting: Internal Medicine

## 2009-04-30 ENCOUNTER — Encounter (HOSPITAL_COMMUNITY): Admission: RE | Admit: 2009-04-30 | Discharge: 2009-07-29 | Payer: Self-pay | Admitting: Internal Medicine

## 2009-04-30 ENCOUNTER — Encounter: Payer: Self-pay | Admitting: Internal Medicine

## 2009-05-06 ENCOUNTER — Ambulatory Visit: Payer: Self-pay | Admitting: Internal Medicine

## 2009-05-09 ENCOUNTER — Emergency Department (HOSPITAL_COMMUNITY): Admission: EM | Admit: 2009-05-09 | Discharge: 2009-05-09 | Payer: Self-pay | Admitting: Emergency Medicine

## 2009-05-15 ENCOUNTER — Ambulatory Visit: Payer: Self-pay | Admitting: Internal Medicine

## 2009-05-20 ENCOUNTER — Ambulatory Visit: Payer: Self-pay | Admitting: Internal Medicine

## 2009-05-23 ENCOUNTER — Telehealth (INDEPENDENT_AMBULATORY_CARE_PROVIDER_SITE_OTHER): Payer: Self-pay | Admitting: *Deleted

## 2009-05-23 ENCOUNTER — Ambulatory Visit: Payer: Self-pay | Admitting: Gastroenterology

## 2009-05-28 ENCOUNTER — Ambulatory Visit: Payer: Self-pay | Admitting: Internal Medicine

## 2009-05-29 ENCOUNTER — Encounter: Payer: Self-pay | Admitting: Internal Medicine

## 2009-06-06 ENCOUNTER — Ambulatory Visit: Payer: Self-pay | Admitting: Internal Medicine

## 2009-06-07 ENCOUNTER — Ambulatory Visit: Payer: Self-pay | Admitting: Internal Medicine

## 2009-06-14 ENCOUNTER — Encounter: Payer: Self-pay | Admitting: Internal Medicine

## 2009-06-17 ENCOUNTER — Ambulatory Visit: Payer: Self-pay | Admitting: Internal Medicine

## 2009-06-18 ENCOUNTER — Ambulatory Visit (HOSPITAL_COMMUNITY): Admission: RE | Admit: 2009-06-18 | Discharge: 2009-06-18 | Payer: Self-pay | Admitting: Gastroenterology

## 2009-06-19 ENCOUNTER — Telehealth: Payer: Self-pay | Admitting: Gastroenterology

## 2009-06-20 ENCOUNTER — Ambulatory Visit: Payer: Self-pay | Admitting: Gastroenterology

## 2009-06-20 ENCOUNTER — Encounter: Payer: Self-pay | Admitting: Gastroenterology

## 2009-06-20 ENCOUNTER — Encounter (INDEPENDENT_AMBULATORY_CARE_PROVIDER_SITE_OTHER): Payer: Self-pay | Admitting: *Deleted

## 2009-06-24 ENCOUNTER — Encounter: Payer: Self-pay | Admitting: Gastroenterology

## 2009-06-28 ENCOUNTER — Ambulatory Visit: Payer: Self-pay | Admitting: Internal Medicine

## 2009-06-28 ENCOUNTER — Ambulatory Visit: Payer: Self-pay | Admitting: Gastroenterology

## 2009-07-08 ENCOUNTER — Telehealth: Payer: Self-pay | Admitting: Internal Medicine

## 2009-07-09 ENCOUNTER — Ambulatory Visit: Payer: Self-pay | Admitting: Gastroenterology

## 2009-07-09 ENCOUNTER — Encounter: Payer: Self-pay | Admitting: Gastroenterology

## 2009-07-11 ENCOUNTER — Encounter: Payer: Self-pay | Admitting: Gastroenterology

## 2009-07-12 ENCOUNTER — Ambulatory Visit: Payer: Self-pay | Admitting: Internal Medicine

## 2009-07-23 ENCOUNTER — Ambulatory Visit (HOSPITAL_BASED_OUTPATIENT_CLINIC_OR_DEPARTMENT_OTHER): Admission: RE | Admit: 2009-07-23 | Discharge: 2009-07-23 | Payer: Self-pay | Admitting: Orthopedic Surgery

## 2009-07-23 ENCOUNTER — Encounter (INDEPENDENT_AMBULATORY_CARE_PROVIDER_SITE_OTHER): Payer: Self-pay | Admitting: Orthopedic Surgery

## 2009-07-29 ENCOUNTER — Telehealth: Payer: Self-pay | Admitting: Gastroenterology

## 2009-07-31 ENCOUNTER — Encounter (HOSPITAL_COMMUNITY): Admission: RE | Admit: 2009-07-31 | Discharge: 2009-10-29 | Payer: Self-pay | Admitting: Internal Medicine

## 2009-08-02 ENCOUNTER — Ambulatory Visit: Payer: Self-pay | Admitting: Internal Medicine

## 2009-08-02 ENCOUNTER — Encounter: Payer: Self-pay | Admitting: Internal Medicine

## 2009-08-07 ENCOUNTER — Encounter: Payer: Self-pay | Admitting: Internal Medicine

## 2009-08-09 ENCOUNTER — Ambulatory Visit: Payer: Self-pay | Admitting: Internal Medicine

## 2009-08-12 ENCOUNTER — Ambulatory Visit: Payer: Self-pay | Admitting: Gastroenterology

## 2009-08-12 DIAGNOSIS — Z860100 Personal history of colon polyps, unspecified: Secondary | ICD-10-CM | POA: Insufficient documentation

## 2009-08-12 LAB — CONVERTED CEMR LAB
Basophils Relative: 0.4 % (ref 0.0–3.0)
Eosinophils Relative: 0.4 % (ref 0.0–5.0)
HCT: 37.8 % (ref 36.0–46.0)
Hemoglobin: 12.6 g/dL (ref 12.0–15.0)
Lymphs Abs: 2.2 10*3/uL (ref 0.7–4.0)
Monocytes Relative: 5.1 % (ref 3.0–12.0)
Neutro Abs: 6.7 10*3/uL (ref 1.4–7.7)
RBC: 4.43 M/uL (ref 3.87–5.11)
RDW: 14.1 % (ref 11.5–14.6)
WBC: 9.4 10*3/uL (ref 4.5–10.5)

## 2009-08-14 ENCOUNTER — Ambulatory Visit: Payer: Self-pay | Admitting: Gastroenterology

## 2009-08-15 ENCOUNTER — Encounter: Payer: Self-pay | Admitting: Gastroenterology

## 2009-08-15 ENCOUNTER — Ambulatory Visit: Payer: Self-pay | Admitting: Internal Medicine

## 2009-08-23 ENCOUNTER — Ambulatory Visit: Payer: Self-pay | Admitting: Internal Medicine

## 2009-08-23 ENCOUNTER — Encounter (INDEPENDENT_AMBULATORY_CARE_PROVIDER_SITE_OTHER): Payer: Self-pay | Admitting: *Deleted

## 2009-08-27 ENCOUNTER — Encounter: Payer: Self-pay | Admitting: Internal Medicine

## 2009-09-02 ENCOUNTER — Ambulatory Visit: Payer: Self-pay | Admitting: Internal Medicine

## 2009-09-06 ENCOUNTER — Telehealth (INDEPENDENT_AMBULATORY_CARE_PROVIDER_SITE_OTHER): Payer: Self-pay | Admitting: *Deleted

## 2009-09-09 ENCOUNTER — Telehealth: Payer: Self-pay | Admitting: Internal Medicine

## 2009-09-09 ENCOUNTER — Ambulatory Visit: Payer: Self-pay | Admitting: Internal Medicine

## 2009-09-10 ENCOUNTER — Telehealth: Payer: Self-pay | Admitting: Internal Medicine

## 2009-09-16 ENCOUNTER — Telehealth: Payer: Self-pay | Admitting: Internal Medicine

## 2009-09-17 ENCOUNTER — Ambulatory Visit: Payer: Self-pay | Admitting: Internal Medicine

## 2009-09-17 ENCOUNTER — Telehealth (INDEPENDENT_AMBULATORY_CARE_PROVIDER_SITE_OTHER): Payer: Self-pay | Admitting: *Deleted

## 2009-09-19 ENCOUNTER — Encounter: Payer: Self-pay | Admitting: Internal Medicine

## 2009-09-20 ENCOUNTER — Ambulatory Visit: Payer: Self-pay | Admitting: Internal Medicine

## 2009-09-23 ENCOUNTER — Telehealth (INDEPENDENT_AMBULATORY_CARE_PROVIDER_SITE_OTHER): Payer: Self-pay | Admitting: *Deleted

## 2009-09-30 ENCOUNTER — Ambulatory Visit: Payer: Self-pay | Admitting: Internal Medicine

## 2009-10-02 ENCOUNTER — Encounter: Payer: Self-pay | Admitting: Internal Medicine

## 2009-10-07 ENCOUNTER — Ambulatory Visit: Payer: Self-pay | Admitting: Internal Medicine

## 2009-10-11 ENCOUNTER — Telehealth: Payer: Self-pay | Admitting: Internal Medicine

## 2009-10-14 ENCOUNTER — Ambulatory Visit: Payer: Self-pay | Admitting: Internal Medicine

## 2009-10-15 ENCOUNTER — Ambulatory Visit: Payer: Self-pay | Admitting: Internal Medicine

## 2009-10-21 ENCOUNTER — Ambulatory Visit: Payer: Self-pay | Admitting: Internal Medicine

## 2009-10-22 ENCOUNTER — Telehealth: Payer: Self-pay | Admitting: Internal Medicine

## 2009-10-23 ENCOUNTER — Telehealth: Payer: Self-pay | Admitting: Internal Medicine

## 2009-10-28 ENCOUNTER — Ambulatory Visit: Payer: Self-pay | Admitting: Internal Medicine

## 2009-10-30 ENCOUNTER — Encounter (HOSPITAL_COMMUNITY): Admission: RE | Admit: 2009-10-30 | Discharge: 2010-01-28 | Payer: Self-pay | Admitting: Internal Medicine

## 2009-11-04 ENCOUNTER — Ambulatory Visit: Payer: Self-pay | Admitting: Internal Medicine

## 2009-11-11 ENCOUNTER — Ambulatory Visit: Payer: Self-pay | Admitting: Pulmonary Disease

## 2009-11-13 ENCOUNTER — Encounter: Payer: Self-pay | Admitting: Internal Medicine

## 2009-11-15 ENCOUNTER — Telehealth: Payer: Self-pay | Admitting: Internal Medicine

## 2009-11-18 ENCOUNTER — Ambulatory Visit: Payer: Self-pay | Admitting: Internal Medicine

## 2009-11-22 ENCOUNTER — Emergency Department (HOSPITAL_COMMUNITY): Admission: EM | Admit: 2009-11-22 | Discharge: 2009-11-22 | Payer: Self-pay | Admitting: Family Medicine

## 2009-12-02 ENCOUNTER — Telehealth: Payer: Self-pay | Admitting: Internal Medicine

## 2009-12-03 ENCOUNTER — Telehealth (INDEPENDENT_AMBULATORY_CARE_PROVIDER_SITE_OTHER): Payer: Self-pay | Admitting: *Deleted

## 2009-12-06 ENCOUNTER — Ambulatory Visit: Payer: Self-pay | Admitting: Internal Medicine

## 2009-12-13 ENCOUNTER — Ambulatory Visit: Payer: Self-pay | Admitting: Internal Medicine

## 2009-12-21 ENCOUNTER — Encounter: Payer: Self-pay | Admitting: Internal Medicine

## 2009-12-23 ENCOUNTER — Ambulatory Visit: Payer: Self-pay | Admitting: Internal Medicine

## 2009-12-24 ENCOUNTER — Telehealth (INDEPENDENT_AMBULATORY_CARE_PROVIDER_SITE_OTHER): Payer: Self-pay | Admitting: *Deleted

## 2009-12-25 ENCOUNTER — Encounter: Payer: Self-pay | Admitting: Internal Medicine

## 2010-01-03 ENCOUNTER — Ambulatory Visit: Payer: Self-pay | Admitting: Internal Medicine

## 2010-01-05 ENCOUNTER — Telehealth: Payer: Self-pay | Admitting: Internal Medicine

## 2010-01-06 ENCOUNTER — Telehealth: Payer: Self-pay | Admitting: Internal Medicine

## 2010-01-06 ENCOUNTER — Encounter: Payer: Self-pay | Admitting: Internal Medicine

## 2010-01-09 ENCOUNTER — Ambulatory Visit: Payer: Self-pay | Admitting: Internal Medicine

## 2010-01-14 ENCOUNTER — Emergency Department (HOSPITAL_COMMUNITY): Admission: EM | Admit: 2010-01-14 | Discharge: 2010-01-14 | Payer: Self-pay | Admitting: Emergency Medicine

## 2010-01-20 ENCOUNTER — Ambulatory Visit: Payer: Self-pay | Admitting: Internal Medicine

## 2010-01-29 ENCOUNTER — Encounter (HOSPITAL_COMMUNITY): Admission: RE | Admit: 2010-01-29 | Discharge: 2010-03-31 | Payer: Self-pay | Admitting: Internal Medicine

## 2010-01-30 ENCOUNTER — Telehealth (INDEPENDENT_AMBULATORY_CARE_PROVIDER_SITE_OTHER): Payer: Self-pay | Admitting: *Deleted

## 2010-02-03 ENCOUNTER — Ambulatory Visit: Payer: Self-pay | Admitting: Internal Medicine

## 2010-02-10 ENCOUNTER — Telehealth: Payer: Self-pay | Admitting: Internal Medicine

## 2010-02-11 ENCOUNTER — Ambulatory Visit: Payer: Self-pay | Admitting: Internal Medicine

## 2010-02-11 ENCOUNTER — Encounter: Payer: Self-pay | Admitting: Internal Medicine

## 2010-02-21 ENCOUNTER — Telehealth: Payer: Self-pay | Admitting: Internal Medicine

## 2010-02-21 ENCOUNTER — Ambulatory Visit: Payer: Self-pay | Admitting: Internal Medicine

## 2010-03-03 ENCOUNTER — Telehealth: Payer: Self-pay | Admitting: Internal Medicine

## 2010-03-10 ENCOUNTER — Ambulatory Visit: Payer: Self-pay | Admitting: Internal Medicine

## 2010-03-17 ENCOUNTER — Ambulatory Visit: Payer: Self-pay | Admitting: Internal Medicine

## 2010-03-24 ENCOUNTER — Telehealth (INDEPENDENT_AMBULATORY_CARE_PROVIDER_SITE_OTHER): Payer: Self-pay | Admitting: *Deleted

## 2010-03-24 ENCOUNTER — Ambulatory Visit: Payer: Self-pay | Admitting: Internal Medicine

## 2010-04-01 ENCOUNTER — Ambulatory Visit: Payer: Self-pay | Admitting: Internal Medicine

## 2010-04-01 LAB — CONVERTED CEMR LAB
AST: 22 units/L (ref 0–37)
Albumin: 3.8 g/dL (ref 3.5–5.2)
Alkaline Phosphatase: 77 units/L (ref 39–117)
Bilirubin, Direct: 0.1 mg/dL (ref 0.0–0.3)
Cholesterol: 145 mg/dL (ref 0–200)
Folate: 13.8 ng/mL
Saturation Ratios: 10.4 % — ABNORMAL LOW (ref 20.0–50.0)
Total Protein: 6.6 g/dL (ref 6.0–8.3)
Transferrin: 268.6 mg/dL (ref 212.0–360.0)
Triglycerides: 144 mg/dL (ref 0.0–149.0)
Vitamin B-12: 165 pg/mL — ABNORMAL LOW (ref 211–911)

## 2010-04-08 ENCOUNTER — Ambulatory Visit: Payer: Self-pay | Admitting: Internal Medicine

## 2010-04-17 ENCOUNTER — Ambulatory Visit: Payer: Self-pay | Admitting: Internal Medicine

## 2010-04-29 ENCOUNTER — Telehealth: Payer: Self-pay | Admitting: Internal Medicine

## 2010-05-01 ENCOUNTER — Telehealth: Payer: Self-pay | Admitting: Internal Medicine

## 2010-05-02 ENCOUNTER — Telehealth: Payer: Self-pay | Admitting: Internal Medicine

## 2010-05-05 ENCOUNTER — Ambulatory Visit: Payer: Self-pay | Admitting: Internal Medicine

## 2010-05-12 ENCOUNTER — Ambulatory Visit: Payer: Self-pay | Admitting: Internal Medicine

## 2010-05-16 ENCOUNTER — Encounter (INDEPENDENT_AMBULATORY_CARE_PROVIDER_SITE_OTHER): Payer: Self-pay | Admitting: *Deleted

## 2010-05-20 ENCOUNTER — Telehealth (INDEPENDENT_AMBULATORY_CARE_PROVIDER_SITE_OTHER): Payer: Self-pay | Admitting: *Deleted

## 2010-06-09 ENCOUNTER — Ambulatory Visit: Payer: Self-pay | Admitting: Internal Medicine

## 2010-06-12 ENCOUNTER — Telehealth: Payer: Self-pay | Admitting: Internal Medicine

## 2010-06-23 ENCOUNTER — Encounter: Payer: Self-pay | Admitting: Internal Medicine

## 2010-06-23 ENCOUNTER — Telehealth (INDEPENDENT_AMBULATORY_CARE_PROVIDER_SITE_OTHER): Payer: Self-pay | Admitting: *Deleted

## 2010-06-23 ENCOUNTER — Ambulatory Visit: Payer: Self-pay | Admitting: Internal Medicine

## 2010-06-30 ENCOUNTER — Telehealth: Payer: Self-pay | Admitting: Internal Medicine

## 2010-07-02 ENCOUNTER — Encounter (INDEPENDENT_AMBULATORY_CARE_PROVIDER_SITE_OTHER): Payer: Self-pay | Admitting: *Deleted

## 2010-07-14 ENCOUNTER — Ambulatory Visit: Payer: Self-pay | Admitting: Internal Medicine

## 2010-07-17 ENCOUNTER — Encounter: Payer: Self-pay | Admitting: Internal Medicine

## 2010-07-17 LAB — HM MAMMOGRAPHY: HM Mammogram: NORMAL

## 2010-07-21 ENCOUNTER — Telehealth: Payer: Self-pay | Admitting: Internal Medicine

## 2010-07-22 ENCOUNTER — Ambulatory Visit: Payer: Self-pay | Admitting: Internal Medicine

## 2010-07-29 ENCOUNTER — Ambulatory Visit: Payer: Self-pay | Admitting: Internal Medicine

## 2010-07-31 ENCOUNTER — Ambulatory Visit: Payer: Self-pay | Admitting: Internal Medicine

## 2010-08-07 ENCOUNTER — Ambulatory Visit: Payer: Self-pay | Admitting: Internal Medicine

## 2010-08-08 ENCOUNTER — Telehealth: Payer: Self-pay | Admitting: Internal Medicine

## 2010-08-14 ENCOUNTER — Telehealth: Payer: Self-pay | Admitting: Internal Medicine

## 2010-08-14 ENCOUNTER — Ambulatory Visit: Payer: Self-pay | Admitting: Internal Medicine

## 2010-08-14 ENCOUNTER — Telehealth (INDEPENDENT_AMBULATORY_CARE_PROVIDER_SITE_OTHER): Payer: Self-pay | Admitting: *Deleted

## 2010-08-27 ENCOUNTER — Emergency Department (HOSPITAL_COMMUNITY)
Admission: EM | Admit: 2010-08-27 | Discharge: 2010-08-27 | Payer: Self-pay | Source: Home / Self Care | Admitting: Emergency Medicine

## 2010-09-08 ENCOUNTER — Telehealth: Payer: Self-pay | Admitting: Internal Medicine

## 2010-09-12 ENCOUNTER — Telehealth: Payer: Self-pay | Admitting: Internal Medicine

## 2010-09-15 ENCOUNTER — Ambulatory Visit: Payer: Self-pay | Admitting: Internal Medicine

## 2010-09-16 ENCOUNTER — Emergency Department (HOSPITAL_COMMUNITY): Admission: EM | Admit: 2010-09-16 | Discharge: 2010-09-16 | Payer: Self-pay | Admitting: Emergency Medicine

## 2010-09-19 ENCOUNTER — Encounter (INDEPENDENT_AMBULATORY_CARE_PROVIDER_SITE_OTHER): Payer: Self-pay | Admitting: *Deleted

## 2010-09-22 ENCOUNTER — Emergency Department (HOSPITAL_COMMUNITY)
Admission: EM | Admit: 2010-09-22 | Discharge: 2010-09-22 | Payer: Self-pay | Source: Home / Self Care | Admitting: Family Medicine

## 2010-09-23 ENCOUNTER — Ambulatory Visit: Payer: Self-pay | Admitting: Internal Medicine

## 2010-09-24 ENCOUNTER — Telehealth: Payer: Self-pay | Admitting: Internal Medicine

## 2010-09-24 ENCOUNTER — Telehealth (INDEPENDENT_AMBULATORY_CARE_PROVIDER_SITE_OTHER): Payer: Self-pay | Admitting: *Deleted

## 2010-09-26 ENCOUNTER — Telehealth: Payer: Self-pay | Admitting: Internal Medicine

## 2010-09-29 ENCOUNTER — Ambulatory Visit: Payer: Self-pay | Admitting: Internal Medicine

## 2010-09-29 ENCOUNTER — Telehealth: Payer: Self-pay | Admitting: Internal Medicine

## 2010-09-29 ENCOUNTER — Encounter: Payer: Self-pay | Admitting: Internal Medicine

## 2010-10-07 ENCOUNTER — Ambulatory Visit: Payer: Self-pay | Admitting: Internal Medicine

## 2010-10-17 ENCOUNTER — Encounter (INDEPENDENT_AMBULATORY_CARE_PROVIDER_SITE_OTHER): Payer: Self-pay | Admitting: *Deleted

## 2010-10-20 ENCOUNTER — Ambulatory Visit: Payer: Self-pay | Admitting: Internal Medicine

## 2010-10-20 ENCOUNTER — Ambulatory Visit: Payer: Self-pay | Admitting: Gastroenterology

## 2010-10-20 ENCOUNTER — Encounter (INDEPENDENT_AMBULATORY_CARE_PROVIDER_SITE_OTHER): Payer: Self-pay | Admitting: *Deleted

## 2010-10-27 ENCOUNTER — Ambulatory Visit: Payer: Self-pay | Admitting: Internal Medicine

## 2010-11-03 ENCOUNTER — Telehealth: Payer: Self-pay | Admitting: Gastroenterology

## 2010-11-04 ENCOUNTER — Encounter: Payer: Self-pay | Admitting: Gastroenterology

## 2010-11-04 ENCOUNTER — Encounter: Payer: Self-pay | Admitting: Internal Medicine

## 2010-11-04 ENCOUNTER — Ambulatory Visit (HOSPITAL_COMMUNITY)
Admission: RE | Admit: 2010-11-04 | Discharge: 2010-11-04 | Payer: Self-pay | Source: Home / Self Care | Admitting: Gastroenterology

## 2010-11-04 LAB — HM COLONOSCOPY

## 2010-11-06 ENCOUNTER — Telehealth: Payer: Self-pay | Admitting: Gastroenterology

## 2010-11-06 ENCOUNTER — Encounter: Payer: Self-pay | Admitting: Gastroenterology

## 2010-11-07 ENCOUNTER — Ambulatory Visit: Payer: Self-pay | Admitting: Gastroenterology

## 2010-11-07 LAB — CONVERTED CEMR LAB
Eosinophils Relative: 0.3 % (ref 0.0–5.0)
HCT: 38.7 % (ref 36.0–46.0)
Lymphocytes Relative: 24.8 % (ref 12.0–46.0)
Lymphs Abs: 2.4 10*3/uL (ref 0.7–4.0)
Monocytes Relative: 6.4 % (ref 3.0–12.0)
Neutrophils Relative %: 68.3 % (ref 43.0–77.0)
Platelets: 248 10*3/uL (ref 150.0–400.0)
WBC: 9.8 10*3/uL (ref 4.5–10.5)

## 2010-11-10 ENCOUNTER — Ambulatory Visit: Payer: Self-pay | Admitting: Internal Medicine

## 2010-11-11 ENCOUNTER — Telehealth: Payer: Self-pay | Admitting: Internal Medicine

## 2010-11-18 ENCOUNTER — Ambulatory Visit: Payer: Self-pay | Admitting: Internal Medicine

## 2010-11-20 ENCOUNTER — Telehealth: Payer: Self-pay | Admitting: Gastroenterology

## 2010-12-08 ENCOUNTER — Telehealth (INDEPENDENT_AMBULATORY_CARE_PROVIDER_SITE_OTHER): Payer: Self-pay | Admitting: *Deleted

## 2010-12-09 ENCOUNTER — Encounter: Payer: Self-pay | Admitting: Internal Medicine

## 2010-12-18 ENCOUNTER — Ambulatory Visit: Payer: Self-pay | Admitting: Internal Medicine

## 2010-12-19 ENCOUNTER — Other Ambulatory Visit: Payer: Self-pay | Admitting: Internal Medicine

## 2010-12-19 ENCOUNTER — Ambulatory Visit
Admission: RE | Admit: 2010-12-19 | Discharge: 2010-12-19 | Payer: Self-pay | Source: Home / Self Care | Attending: Internal Medicine | Admitting: Internal Medicine

## 2010-12-19 LAB — LIPID PANEL
Cholesterol: 153 mg/dL (ref 0–200)
HDL: 50.6 mg/dL (ref >39.00)
LDL Cholesterol: 68 mg/dL (ref 0–99)
Total CHOL/HDL Ratio: 3
Triglycerides: 174 mg/dL — ABNORMAL HIGH (ref 0.0–149.0)
VLDL: 34.8 mg/dL (ref 0.0–40.0)

## 2010-12-19 LAB — BASIC METABOLIC PANEL
BUN: 17 mg/dL (ref 6–23)
CO2: 30 mEq/L (ref 19–32)
Calcium: 9.5 mg/dL (ref 8.4–10.5)
Chloride: 104 mEq/L (ref 96–112)
Creatinine, Ser: 0.9 mg/dL (ref 0.4–1.2)
GFR: 70.93 mL/min (ref >60.00)
Glucose, Bld: 105 mg/dL — ABNORMAL HIGH (ref 70–99)
Potassium: 3.8 mEq/L (ref 3.5–5.1)
Sodium: 143 mEq/L (ref 135–145)

## 2010-12-19 LAB — HEPATIC FUNCTION PANEL
ALT: 15 U/L (ref 0–35)
AST: 18 U/L (ref 0–37)
Albumin: 4.2 g/dL (ref 3.5–5.2)
Alkaline Phosphatase: 87 U/L (ref 39–117)
Bilirubin, Direct: 0.1 mg/dL (ref 0.0–0.3)
Total Bilirubin: 0.6 mg/dL (ref 0.3–1.2)
Total Protein: 7.2 g/dL (ref 6.0–8.3)

## 2010-12-19 LAB — HEMOGLOBIN A1C: Hgb A1c MFr Bld: 7.8 % — ABNORMAL HIGH (ref 4.6–6.5)

## 2010-12-19 LAB — TSH: TSH: 2.85 u[IU]/mL (ref 0.35–5.50)

## 2010-12-20 ENCOUNTER — Encounter: Payer: Self-pay | Admitting: Cardiology

## 2010-12-21 ENCOUNTER — Encounter: Payer: Self-pay | Admitting: Internal Medicine

## 2010-12-28 ENCOUNTER — Ambulatory Visit: Payer: Self-pay | Admitting: Internal Medicine

## 2011-01-01 NOTE — Progress Notes (Signed)
Summary: rx request-pt returned call from triage-x 2  Phone Note Call from Patient   Caller: Patient Call For: young Summary of Call: pt requests refill of meds for her nebulizor. [facility] [address] pt # [phone] Initial call taken by: Tivis Ringer, CNA,  January 06, 2010 9:05 AM  Follow-up for Phone Call        [facility]. What is the patient using in her nebulize? Duoneb was removed from her list in 11/2008.[name]  January 06, 2010 11:34 AM  pt returned call from nurse (no answer in triage). pt states that while she hasn't used her neb often, she does need to use it now for congestion. says she uses albuterol in the plastic vials w/ 5 or 6 tubes in a pack- twist off cap. call pt before 1:30 if possible. Tivis Ringer, CNA  January 06, 2010 12:57 PM  Additional Follow-up for Phone Call Additional follow up Details #1::        Dr Maple Hudson this pt's neb med duoneb was removed from pt list in 11/2008 pt states she does not use the neb all the time but feels she needs it now do you want [place] to call in neb meds for her and if so what med and directions  Philipp Deputy [facility]  January 06, 2010 2:00 PM     Additional Follow-up for Phone Call Additional follow up Details #2::    Pt sees MR for pulmonary and CDY for allergies.[name]  pt called back requesting this be called in asap. also states that she only wants 2 boxes so none is wasted since she doesn't use this every day. call pt to confirm this has been called in. pt # [phone]. Tivis Ringer, CNA  January 08, 2010 9:36 AM  MR please advise if okay to give pt Duoneb, this has been removed from her med list. Please advise. Carron Curie CMA  January 08, 2010 9:51 AM   Additional Follow-up for Phone Call Additional follow up Details #3:: Details for Additional Follow-up Action Taken: because she is on symbicort and spiriva, she can only have albuterol neb as needed. Ok for albuterol neb only Additional Follow-up by:  Kalman Shan MD,  January 08, 2010 10:04 AM  New/Updated Medications: ALBUTEROL SULFATE (2.5 MG/3ML) 0.083% NEBU (ALBUTEROL SULFATE) four times a day as needed for SOB Prescriptions: ALBUTEROL SULFATE (2.5 MG/3ML) 0.083% NEBU (ALBUTEROL SULFATE) four times a day as needed for SOB  #90 x 0   Entered by:   Carron Curie CMA   Authorized by:   Kalman Shan MD   Signed by:   Carron Curie CMA on 01/08/2010   Method used:   Electronically to        [facility] [address] [phone]* (retail)       [address]       [place], [place]  [address]       Ph: [phone] or [phone]       Fax: [phone]   RxID:   [id number]  pt notified and rx sent.Carron Curie CMA  January 08, 2010 12:17 PM

## 2011-01-01 NOTE — Progress Notes (Signed)
Summary: On call- doxycycline bid x 7 days  Phone Note Call from Patient   Summary of Call: On call- Says script for doxy wasn't called in. I called doxycycline 100 mg, #14, 1 two times a day- to [facility]. Initial call taken by: Waymon Budge MD,  January 05, 2010 7:42 PM

## 2011-01-01 NOTE — Progress Notes (Signed)
Summary: REFERRAL - Neuro  Phone Note Call from Patient Call back at Home Phone [phone]   Summary of Call: Pt is requesting a referral to a neurologist. She has been seeing Dr Luiz Blare for fracture of femur. Dr Luiz Blare advised pt that she needs referral to neuro from PCP. Unsure reason, will need to contact pt.  Initial call taken by: Lamar Sprinkles, CMA,  May 02, 2010 4:10 PM  Follow-up for Phone Call        # busy.....................Marland KitchenLamar Sprinkles, CMA  May 05, 2010 2:45 PM   Patient is requesting referral to neuro. She c/o frequent falls due to "loosing her balance". Last fall she fractured her femur and both ankles. She is currently seeing ortho and they suggested she discuss referral with PCP. Advised pt to have ortho send PCP updates. Please advise on referral.  Follow-up by: Lamar Sprinkles, CMA,  May 07, 2010 11:14 AM  Additional Follow-up for Phone Call Additional follow up Details #1::        OK for referral to [facility] for problem of repeated falls with severe injury - multiple fractures.  Additional Follow-up by: Jacques Navy MD,  May 07, 2010 5:05 PM

## 2011-01-01 NOTE — Miscellaneous (Signed)
Summary: Injection Record/[facility]  Injection Record/[facility]   Imported By: Sherian Rein 04/22/2010 08:41:11  _____________________________________________________________________  External Attachment:    Type:   Image     Comment:   External Document

## 2011-01-01 NOTE — Progress Notes (Signed)
Summary: rx request/ cough/ congestion  Phone Note Call from Patient Call back at Home Phone [phone]   Caller: Patient Call For: young Summary of Call: pt says she is still coughing/ sinus congestion. says dr young told her to call if she needs more [facility]xycycline. [facility] [address]  Initial call taken by: Tivis Ringer, CNA,  May 20, 2010 1:30 PM  Follow-up for Phone Call        called and spoke with pt.  pt states she was recently seen by CY on 05-12-2010.  Pt states she had a prev script for doxycycline to use if she developed infection. Pt states she used this script "awhile ago" and had no more refills on it.  Pt now c/o sore throat, "head hurts", head congestion, coughing up clear sputum, yellow nasal drainage, headache, and sneezing.  pt denied increased sob or fever.  please advise.  thanks. Arman Filter LPN  May 20, 2010 1:38 PM  allergies: latex, lisinopril, codeine, demerol, lidocaine patch, asa  Additional Follow-up for Phone Call Additional follow up Details #1::        PER CDY:  ok to give Doxycycline 100mg   # 8 x 0 refills.  take 2 today and then 1 daily until gone.    LMOM informing pt rx sent to pharmacy.  Aundra Millet Reynolds LPN  May 20, 2010 2:56 PM     New/Updated Medications: DOXYCYCLINE HYCLATE 100 MG CAPS (DOXYCYCLINE HYCLATE) take 2 today and then 1 daily until gone. Prescriptions: DOXYCYCLINE HYCLATE 100 MG CAPS (DOXYCYCLINE HYCLATE) take 2 today and then 1 daily until gone.  #8 x 0   Entered by:   Arman Filter LPN   Authorized by:   Waymon Budge MD   Signed by:   Arman Filter LPN on 09/81/1914   Method used:   Electronically to        [facility]. [address] [phone]* (retail)       [address]       [place], [place]  [address]       Ph: [phone] or [phone]       Fax: [phone]   RxID:   [id number]

## 2011-01-01 NOTE — Progress Notes (Signed)
Summary: Educational psychologist PA  Phone Note From Pharmacy   Caller: [facility] [address] [phone]* Call For: [phone]  Summary of Call: Rec'd fax from pharmacy that patient Venlafazine needs PA. Initial call taken by: Lucious Groves,  December 02, 2009 9:12 AM  Follow-up for Phone Call        Spoke with insurance, pt does not need PA on this med, pharm tried to refill too soon, fax sent to pharm with this note.  Follow-up by: Lamar Sprinkles, CMA,  December 02, 2009 9:48 AM    Prescriptions: VENLAFAXINE HCL 37.5 MG TABS (VENLAFAXINE HCL) Take 2 tablet by mouth once a day  #180 x 0   Entered by:   Lamar Sprinkles, CMA   Authorized by:   Jacques Navy MD   Signed by:   Lamar Sprinkles, CMA on 12/02/2009   Method used:   Electronically to        [facility] [address] [phone]* (retail)       [address]       [place], [place]  [address]       Ph: [phone] or [phone]       Fax: [phone]   RxID:   [id number]

## 2011-01-01 NOTE — Assessment & Plan Note (Signed)
Summary: fu Natale Milch   Vital Signs:  Patient profile:   65 year old female Height:      63 inches Weight:      233 pounds BMI:     41.42 O2 Sat:      97 % on Room air Temp:     97.5 degrees F oral Pulse rate:   79 / minute BP sitting:   142 / 64  (left arm) Cuff size:   large  Vitals Entered By: Bill Salinas CMA (September 15, 2010 4:28 PM)  O2 Flow:  Room air CC: ov to discuss hormone replacement. Pt c/o chronic weakness and fatigue/ ab   Primary Care Provider:  Illene Regulus, MD  CC:  ov to discuss hormone replacement. Pt c/o chronic weakness and fatigue/ ab.  History of Present Illness: Patient presents asking if she can be started on HRT. She 30 years post-menopausal!. She is asking due to not feeling well. She has no specific complaint but just has no energy. ON questioning she does worry, feels hopeless, has anhedonia, loss of appetite, perseveration.  Reviewed chart: she was on Paxil CR changed 04/10/08 to pristiq. She did ok but for cost reasons was changed to venlafaxin 37.5 in July '09. The dose has never  been advanced beyond this low starting. dose.   Current Medications (verified): 1)  Symbicort 80-4.5 Mcg/act Aero (Budesonide-Formoterol Fumarate) .... 2 Puffs Two Times A Day 2)  Astelin 137 Mcg/spray  Soln (Azelastine Hcl) .Marland Kitchen.. 1 Puff Each Nostril At Bedtime 3)  Allergy Vaccine 1:10 Gh .... Advance Next Order From 1:50 To 1:10 Per Protocol 4)  Proair Hfa 108 (90 Base) Mcg/act Aers (Albuterol Sulfate) .... Inhale 2 Puffs Every 4-6 Hours As Needed For Shortness of Breath 5)  Metformin Hcl 1000 Mg  Tabs (Metformin Hcl) .... Take One Tablet Twice Daily 6)  Levoxyl 75 Mcg  Tabs (Levothyroxine Sodium) .... Take One Tablet Once Daily 7)  Lantus 100 Unit/ml  Soln (Insulin Glargine) .... 40 Units Once Daily 8)  Calcium Carbonate-Vitamin D 600-400 Mg-Unit  Tabs (Calcium Carbonate-Vitamin D) .... Take 1 Tablet By Mouth Two Times A Day 9)  Hydrocodone-Acetaminophen 5-500 Mg  Tabs  (Hydrocodone-Acetaminophen) .Marland Kitchen.. 1 Three Times A Day 10)  Aciphex 20 Mg  Tbec (Rabeprazole Sodium) .... Take 1 Tablet By Mouth Once A Day 11)  Amlodipine Besylate 5 Mg  Tabs (Amlodipine Besylate) .... Take 1 Tablet By Mouth Once A Day 12)  Venlafaxine Hcl 37.5 Mg Tabs (Venlafaxine Hcl) .... Take 2 Tablet By Mouth Once A Day 13)  Diovan 160 Mg Tabs (Valsartan) .Marland Kitchen.. 1 Tab Once Daily/ 14)  Simvastatin 40 Mg Tabs (Simvastatin) .... Take 1 Tablet By Mouth Once A Day 15)  Eye Vitamins   Caps (Multiple Vitamins-Minerals) .... Take One Tablet Once Daily 16)  Multivitamins   Tabs (Multiple Vitamin) .... Take 1 Tablet By Mouth Once A Day 17)  Cranberry 500 Mg Caps (Cranberry) .... Take 1 Capsule By Mouth Once A Day 18)  Excedrin Extra Strength 250-250-65 Mg Tabs (Aspirin-Acetaminophen-Caffeine) .... Per Bottle As Needed 19)  Cvs Saline Nasal Spray 0.65 % Soln (Saline) .... 2 Puffs Every 4 Hours As Needed 20)  Bd Integra Insulin Syringe 29g X 1/2" 1 Ml  Misc (Insulin Syringe-Needle U-100) .... #30 For Use With Lantus 21)  Novofine 30g X 8 Mm  Misc (Insulin Pen Needle) .... Use With Novolog Pen 22)  Accu-Chek Aviva  Strp (Glucose Blood) .... Use 1 Test Strip Every Cbg  Check and Check Cbg 4 Times A Day 23)  Sudafed 12 Hour 120 Mg Xr12h-Tab (Pseudoephedrine Hcl) .... As Needed 24)  Zetia 10 Mg Tabs (Ezetimibe) .... Take 1 Tablet By Mouth Once A Day 25)  Iron 325 (65 Fe) Mg Tabs (Ferrous Sulfate) .Marland Kitchen.. 1 By Mouth Once Daily 26)  Spiriva Handihaler 18 Mcg Caps (Tiotropium Bromide Monohydrate) .Marland Kitchen.. 1 Inhalation Daily 27)  Klonopin 1 Mg Tabs (Clonazepam) .Marland Kitchen.. 1-2 By Mouth At Bedtime As Needed For Insomnia 28)  Nitrofurantoin Monohyd Macro 100 Mg Caps (Nitrofurantoin Monohyd Macro) .... Take 1 By Mouth At Bedtime 29)  Albuterol Sulfate (2.5 Mg/88ml) 0.083% Nebu (Albuterol Sulfate) .... Four Times A Day As Needed For Sob 30)  Lancets of Pt's Choice .... Dx:250.00 Insulin Dependent Test Three Times A Day As  Needed 31)  Zithromax Z-Pak 250 Mg Tabs (Azithromycin) .... As Directed  Allergies (verified): 1)  ! Lisinopril 2)  ! Codeine 3)  ! Demerol 4)  ! Asa 5)  ! 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) FOOT SURGERY, HX OF (ICD-V15.89) HYSTERECTOMY, HX OF (ICD-V45.77) HYPERLIPIDEMIA (ICD-272.4) DEPRESSION (ICD-311) HYPERTENSION (ICD-401.9) HYPOTHYROIDISM (ICD-244.9) DIABETES MELLITUS (ICD-250.00) SINUSITIS (ICD-473.9) ALLERGIC RHINITIS (ICD-477.9) ASTHMA (ICD-493.90) POSTNASAL DRIP (ICD-784.91) CONJUNCTIVITIS, MILD (ICD-372.30) ALLERGY, FOOD (ICD-693.1) C O P D (ICD-496) COUGH (ICD-786.2) BRONCHITIS, ACUTE (ICD-466.0) Neg cardiac sress test - 08/21/2008  Past Surgical History: Last updated: 08/12/2009 FOOT SURGERY, HX OF (ICD-V15.89) hammer toe  '04, '11-Apr-2023 HYSTERECTOMY, HX OF (ICD-V45.77) 04-17-81, menometorrhagia Carpaal tunnnel bilateral....................Marland KitchenDr. 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  Patient states former smoker. Stop 9 yrs ago Environment: House with crawl space, central  air conditioning, hard wood.  No feather bedding, no mold.  Son smokes.  Pets including dogs, cats, 3, birds. Angioedema with Aspirin. Alcohol Use - no Daily Caffeine Use two cups a day Illicit Drug Use - no Patient gets regular exercise.  Review of Systems  The patient denies anorexia, fever, weight loss, weight gain, chest pain, syncope, dyspnea on exertion, prolonged cough, abdominal pain, severe indigestion/heartburn, muscle weakness, difficulty walking, and enlarged lymph nodes.    Physical Exam  General:  overweight white female in no distress Head:  normocephalic and atraumatic.   Neck:  supple and full ROM.   Lungs:  normal respiratory effort, normal breath sounds, and no wheezes.   Heart:  normal rate and regular rhythm.   Abdomen:  obese, soft, BS + Neurologic:  resting tremor. Psych:  postive vegative signs of depression - see HPI.    Impression & Recommendations:  Problem # 1:  ANXIETY DEPRESSION (ICD-300.4) Patient with probable poorly controlled depression.  Plan - advance venlafaxin to 75 mg two times a day  recheck in 3 weeks.   Complete Medication List: 1)  Symbicort 80-4.5 Mcg/act Aero (Budesonide-formoterol fumarate) .... 2 puffs two times a day 2)  Astelin 137 Mcg/spray Soln (Azelastine hcl) .Marland Kitchen.. 1 puff each nostril at bedtime 3)  Allergy Vaccine 1:10 Gh  .... Advance next order from 1:50 to 1:10 per protocol 4)  Proair Hfa 108 (90 Base) Mcg/act Aers (Albuterol sulfate) .... Inhale 2 puffs every 4-6 hours as needed for shortness of breath 5)  Metformin Hcl 1000 Mg Tabs (Metformin hcl) .... Take one tablet twice daily 6)  Levoxyl 75 Mcg Tabs (Levothyroxine sodium) .... Take one tablet once daily 7)  Lantus 100 Unit/ml Soln (Insulin glargine) .... 40 units once daily 8)  Calcium Carbonate-vitamin D 600-400 Mg-unit Tabs (Calcium carbonate-vitamin d) .... Take 1 tablet by mouth two times a day 9)  Hydrocodone-acetaminophen 5-500 Mg Tabs  (Hydrocodone-acetaminophen) .Marland Kitchen.. 1 three times a day 10)  Aciphex 20 Mg Tbec (Rabeprazole sodium) .... Take 1 tablet by mouth once a day 11)  Amlodipine Besylate 5 Mg Tabs (Amlodipine besylate) .... Take 1 tablet by mouth once a day 12)  Venlafaxine Hcl 75 Mg Tabs (Venlafaxine hcl) .Marland Kitchen.. 1 by mouth two times a day 13)  Diovan 160 Mg Tabs (Valsartan) .Marland Kitchen.. 1 tab once daily/ 14)  Simvastatin 40 Mg Tabs (Simvastatin) .... Take 1 tablet by mouth once a day 15)  Eye Vitamins Caps (Multiple vitamins-minerals) .... Take one tablet once daily 16)  Multivitamins Tabs (Multiple vitamin) .... Take 1 tablet by mouth once a day 17)  Cranberry 500 Mg Caps (Cranberry) .... Take 1 capsule by mouth once a day 18)  Excedrin Extra Strength 250-250-65 Mg Tabs (Aspirin-acetaminophen-caffeine) .... Per bottle as needed 19)  Cvs Saline Nasal Spray 0.65 % Soln (Saline) .... 2 puffs every 4 hours as needed 20)  Bd Integra Insulin Syringe 29g X 1/2" 1 Ml Misc (Insulin syringe-needle u-100) .... #30 for use with lantus 21)  Novofine 30g X 8 Mm Misc (Insulin pen needle) .... Use with novolog pen 22)  Accu-chek Aviva Strp (Glucose blood) .... Use 1 test strip every cbg check and check cbg 4 times a day 23)  Sudafed 12 Hour 120 Mg Xr12h-tab (Pseudoephedrine hcl) .... As needed 24)  Zetia 10 Mg Tabs (Ezetimibe) .... Take 1 tablet by mouth once a day 25)  Iron 325 (65 Fe) Mg Tabs (Ferrous sulfate) .Marland Kitchen.. 1 by mouth once daily 26)  Spiriva Handihaler 18 Mcg Caps (Tiotropium bromide monohydrate) .Marland Kitchen.. 1 inhalation daily 27)  Klonopin 1 Mg Tabs (Clonazepam) .Marland Kitchen.. 1-2 by mouth at bedtime as needed for insomnia 28)  Nitrofurantoin Monohyd Macro 100 Mg Caps (Nitrofurantoin monohyd macro) .... Take 1 by mouth at bedtime 29)  Albuterol Sulfate (2.5 Mg/35ml) 0.083% Nebu (Albuterol sulfate) .... Four times a day as needed for sob 30)  Lancets of Pt's Choice  .... Dx:250.00 insulin dependent test three times a day as needed 31)  Zithromax  Z-pak 250 Mg Tabs (Azithromycin) .... As directed  Patient Instructions: 1)  No energy - I believe that you have undertreated depression as a driving cause of "not feeling well." Plan - increase Venlafaxin to 75mg  two times a day. You may double up on the remaining pills that you have and then a new Rx has been sent to your pharmacy. I will need to see you in 3 weeks and see how you are doing.  Prescriptions: VENLAFAXINE HCL 75 MG TABS (VENLAFAXINE HCL) 1 by mouth two times a  day  #60 x 3   Entered and Authorized by:   Jacques Navy MD   Signed by:   Jacques Navy MD on 09/15/2010   Method used:   Electronically to        [facility]. [address] [phone]* (retail)       [address]       [place], [place]  [address]       Ph: [phone] or [phone]       Fax: [phone]   RxID:   [id number]    Orders Added: 1)  Est. Patient Level III [03474]

## 2011-01-01 NOTE — Progress Notes (Signed)
  Phone Note Refill Request Message from:  Fax from Pharmacy on September 24, 2010 1:14 PM  Refills Requested: Medication #1:  KLONOPIN 1 MG TABS 1-2 by mouth at bedtime as needed for insomnia please Advise refill  Initial call taken by: Ami Bullins CMA,  September 24, 2010 1:15 PM  Follow-up for Phone Call        ok for refill x 5 Follow-up by: Jacques Navy MD,  September 24, 2010 2:07 PM    Prescriptions: KLONOPIN 1 MG TABS (CLONAZEPAM) 1-2 by mouth at bedtime as needed for insomnia  #180 x 5   Entered by:   Ami Bullins CMA   Authorized by:   Jacques Navy MD   Signed by:   Bill Salinas CMA on 09/24/2010   Method used:   Telephoned to ...       [facility] [address] [phone]* (retail)       [address]       [place], [place]  [address]       Ph: [phone] or [phone]       Fax: [phone]   RxID:   [id number]

## 2011-01-01 NOTE — Progress Notes (Signed)
Summary: REFILL  Phone Note Call from Patient Call back at [facility] Phone [phone]   Caller: Patient Call For: Kalyn Hofstra Summary of Call: NEEDS REFILL ON DOXYCYCLINE [facility] AND [facility] Initial call taken by: Lacinda Axon,  May 01, 2010 12:14 PM  Follow-up for Phone Call        [facility].Carron Curie CMA  May 01, 2010 12:48 PM   pt returned a call .   Please call her back. Follow-up by: Eugene Gavia,  May 01, 2010 3:50 PM  Additional Follow-up for Phone Call Additional follow up Details #1::        pt was given and rx for doxycycline on 03-24-10 with an additional refill. She has used both courses. She now c/o chest congestion, productuve cough with clear phlegm, nasal congestion with clear mucus as well x 1 week.  Pt has an appt on June 13. Pelase advise. Carron Curie CMA  May 01, 2010 3:55 PM allergies: 1)  ! Lisinopril 2)  ! Codeine 3)  ! Demerol 4)  ! Asa 5)  ! * Latex 6)  * Lidocaine Patch    Additional Follow-up for Phone Call Additional follow up Details #2::    per CY---ok for pt to have pred taper  10mg   #20   4  x 2 days, 3 x 2 days, 2 x 2 days, 1 x 2 days then stop.  this has been sent to the pharmacy for her and pt is aware Randell Loop CMA  May 01, 2010 5:34 PM   New/Updated Medications: PREDNISONE 10 MG TABS (PREDNISONE) take 4 tabs x 2days, 3 tabs x 2 days, 2 x 2 days, 1 x 2 days then stop Prescriptions: PREDNISONE 10 MG TABS (PREDNISONE) take 4 tabs x 2days, 3 tabs x 2 days, 2 x 2 days, 1 x 2 days then stop  #20 x 0   Entered by:   Randell Loop CMA   Authorized by:   Waymon Budge MD   Signed by:   Randell Loop CMA on 05/01/2010   Method used:   Electronically to        [facility]. [address] [phone]* (retail)       [address]       [place], [place]  [address]       Ph: [phone] or [phone]       Fax: [phone]   RxID:   [id number]

## 2011-01-01 NOTE — Letter (Signed)
Summary: [facility] Consult Scheduled Letter  [facility]  [address] [place], [place] [address]   Phone: [phone]  Fax: [phone]      05/16/2010 MRN: [id number]  [facility] [address] [place], [place]  [address]    Dear Ms. Gottschall,      We have scheduled an appointment for you.  At the recommendation of Dr.Norins,we have scheduled you a consult with [facility]  on July,18,2011 at 11:45AM.  Their phone number is [phone]. If this appointment day and time is not convenient for you, please feel free to call the office of the doctor you are being referred to at the number listed above and reschedule the appointment.  **please arrive 30 min prior to your appointment time [facility]   [address], [address], [place], [place] [address]   Thank you,  Patient Care Coordinator [facility]

## 2011-01-01 NOTE — Progress Notes (Signed)
Summary: allergy shots Pt. is coming in this p.m.(12-08-2010)  Phone Note Call from Patient Call back at [facility] Phone [phone]   Caller: Patient Call For: DR [name] Summary of Call: Patient phoned and would like a call back from Dr. Roxy Cedar nurse. She takes allergy shots and she doesn't have any transportation now. She wants to know if the nurse can talk with Dr. Maple Hudson she wants to know if she can take her shots at home until she can get another car. Patient can be reached [phone]. She is coming this afternoon for her shot. Initial call taken by: Vedia Coffer,  December 08, 2010 9:09 AM  Follow-up for Phone Call        [facility] [name]  December 08, 2010 9:49 AM  Spoke with pt.  She states that she is going to have issues getting here for the next few months and she wants to know if she can give injections herself at home.  She states that she has done this before.  Pls advise thanks Follow-up by: [name],  December 08, 2010 3:45 PM  Additional Follow-up for Phone Call Additional follow up Details #1::        Please direct allergy vaccine questions through allergy lab. They can forwrd to me but this puts them in the loop early.   I don't feel it is good practice or safe to do this, given current practice standards. Allergy lab can talk with me about changing the interval or dropping shots until she is able to resume.  Additional Follow-up by: Waymon Budge MD,  December 08, 2010 4:50 PM    Additional Follow-up for Phone Call Additional follow up Details #2::    Spoke with pt and notified of the above recs per CDY.  I will forward this msg to [name] so that she can discuss this with Dr Maple Hudson.  I called [name] and advised that I will forward her the msg and she verbalized understanding. Follow-up by: [name],  December 08, 2010 5:00 PM  Additional Follow-up for Phone Call Additional follow up Details #3:: Details for Additional Follow-up Action Taken: Dr. Su Hilt  read all the notes and it sounds like she's going to be with out transpiortion for a few months. Should we just ask her to come in once a month? Please advise. ..............................................................................................................................................  I think once monthly would be a good compromise. Waymon Budge MD  December 08, 2010 5:25 PM  Additional Follow-up by: [name],  December 08, 2010 5:11 PM

## 2011-01-01 NOTE — Assessment & Plan Note (Signed)
Summary: rov//mbw   Copy to:  n/a Primary Provider/Referring Provider:  Illene Regulus, MD  CC:  Follow up visit-allergies;cough-green this morning.Marland Kitchen  History of Present Illness:  October 15, 2009- Food allergy, allergic rhinitis, Asthma/ COPD (MR) Acute visit-  1 week head and chest congestion. Sore throat and head hurt. Took abx, most recently doxycycline finished 1-2 weeks ago. No fever. cough productive green just today. Left nasal congestion. Now on nitrofurantoin for UTI. Continues to tell me allergy vaccine seems to help. Some wheeze. She has sliding scale for her insulin.  November 11, 2009- Food Allergy, allergic rhinitis, Asthma/COPD (MR) Breathing is better- she says she no longer gets short of breath.  Reviewed meds.Spiriva helped. She continues to do well with her allergy vaccine.  January 03, 2010 ---Presents for an acute ofifce visit. Complains of dyspnea, some wheezing, tightness in chest, dry cough, back aches, sore throat, HA, increased fatigue/weakness x1.5weeks - denies fc/s Of note she remains on  macrodant for chornic UTI. OTC not working. Denies chest pain,  orthopnea, hemoptysis, fever, n/v/d, edema, headache.   January 09, 2010-  Chest cold onset 2 weeks. Took doxy from NP without help. Head hurts, cough green, fever. Feels rattle this afternoon but now nonproductive. Some nausea and loose stoools. Hurt across back and chest with some choking sensation 2 nights ago- suggestivre of a reflux event. Some hurting around under left lower lateral ribs- tussive but not pleuritic otherwise.   Current Medications (verified): 1)  Symbicort 80-4.5 Mcg/act Aero (Budesonide-Formoterol Fumarate) .... 2 Puffs Two Times A Day 2)  Veramyst 27.5 Mcg/spray Susp (Fluticasone Furoate) .... 2 Sprays Daily 3)  Astelin 137 Mcg/spray  Soln (Azelastine Hcl) .Marland Kitchen.. 1 Puff Each Nostril At Bedtime 4)  Allergy Vaccine 1:10 Gh .... Advance Next Order From 1:50 To 1:10 Per Protocol 5)   Proair Hfa 108 (90 Base) Mcg/act Aers (Albuterol Sulfate) .... Inhale 2 Puffs Every 4-6 Hours As Needed For Shortness of Breath 6)  Metformin Hcl 1000 Mg  Tabs (Metformin Hcl) .... Take One Tablet Twice Daily 7)  Levoxyl 75 Mcg  Tabs (Levothyroxine Sodium) .... Take One Tablet Once Daily 8)  Lantus 100 Unit/ml  Soln (Insulin Glargine) .... 25units Once Daily 9)  Calcium Carbonate-Vitamin D 600-400 Mg-Unit  Tabs (Calcium Carbonate-Vitamin D) .... Take 1 Tablet By Mouth Two Times A Day 10)  Hydrocodone-Acetaminophen 5-500 Mg  Tabs (Hydrocodone-Acetaminophen) .Marland Kitchen.. 1 Three Times A Day 11)  Aciphex 20 Mg  Tbec (Rabeprazole Sodium) .... Take 1 Tablet By Mouth Once A Day 12)  Amlodipine Besylate 5 Mg  Tabs (Amlodipine Besylate) .... Take 2 Tablet By Mouth Once A Day 13)  Venlafaxine Hcl 37.5 Mg Tabs (Venlafaxine Hcl) .... Take 2 Tablet By Mouth Once A Day 14)  Diovan 160 Mg Tabs (Valsartan) .Marland Kitchen.. 1 Tab Once Daily/ 15)  Simvastatin 40 Mg Tabs (Simvastatin) .... Take 1 Tablet By Mouth Once A Day 16)  Eye Vitamins   Caps (Multiple Vitamins-Minerals) .... Take One Tablet Once Daily 17)  Multivitamins   Tabs (Multiple Vitamin) .... Take 1 Tablet By Mouth Once A Day 18)  Cranberry 500 Mg Caps (Cranberry) .... Take 1 Capsule By Mouth Once A Day 19)  Excedrin Extra Strength 250-250-65 Mg Tabs (Aspirin-Acetaminophen-Caffeine) .... Per Bottle As Needed 20)  Cvs Saline Nasal Spray 0.65 % Soln (Saline) .... 2 Puffs Every 4 Hours As Needed 21)  Bd Integra Insulin Syringe 29g X 1/2" 1 Ml  Misc (Insulin Syringe-Needle U-100) .... #30 For  Use With Lantus 22)  Novofine 30g X 8 Mm  Misc (Insulin Pen Needle) .... Use With Novolog Pen 23)  Accu-Chek Aviva  Strp (Glucose Blood) .... Use 1 Test Strip Every Cbg Check and Check Cbg 4 Times A Day 24)  Sudafed 12 Hour 120 Mg Xr12h-Tab (Pseudoephedrine Hcl) .... As Needed 25)  Zetia 10 Mg Tabs (Ezetimibe) .... Take 1 Tablet By Mouth Once A Day 26)  Iron 325 (65 Fe) Mg Tabs  (Ferrous Sulfate) .Marland Kitchen.. 1 By Mouth Once Daily 27)  Spiriva Handihaler 18 Mcg Caps (Tiotropium Bromide Monohydrate) .Marland Kitchen.. 1 Inhalation Daily 28)  Klonopin 1 Mg Tabs (Clonazepam) .Marland Kitchen.. 1-2 By Mouth At Bedtime As Needed For Insomnia 29)  Nitrofurantoin Monohyd Macro 100 Mg Caps (Nitrofurantoin Monohyd Macro) .... Take 1 By Mouth At Bedtime 30)  Albuterol Sulfate (2.5 Mg/17ml) 0.083% Nebu (Albuterol Sulfate) .... Four Times A Day As Needed For Sob  Allergies (verified): 1)  ! Lisinopril 2)  ! Codeine 3)  ! Demerol 4)  ! Asa 5)  ! 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) FOOT SURGERY, HX OF (ICD-V15.89) HYSTERECTOMY, HX OF (ICD-V45.77) HYPERLIPIDEMIA (ICD-272.4) DEPRESSION (ICD-311) HYPERTENSION (ICD-401.9) HYPOTHYROIDISM (ICD-244.9) DIABETES MELLITUS (ICD-250.00) SINUSITIS (ICD-473.9) ALLERGIC RHINITIS (ICD-477.9) ASTHMA (ICD-493.90) POSTNASAL DRIP (ICD-784.91) CONJUNCTIVITIS, MILD (ICD-372.30) ALLERGY, FOOD (ICD-693.1) C O P D (ICD-496) COUGH (ICD-786.2) BRONCHITIS, ACUTE (ICD-466.0) Neg cardiac sress test - 08/21/2008  Past Surgical History: Last updated: 08/12/2009 FOOT SURGERY, HX OF (ICD-V15.89) hammer toe  '04, 'Apr 29, 2023 HYSTERECTOMY, HX OF (ICD-V45.77) 05/05/81, menometorrhagia Carpaal tunnnel bilateral....................Marland KitchenDr. Merlyn Lot -> 03/05/2009  Family History: Last updated: June 17, 2009 father-died May 05, 2065, lung cancer, HTN, DM mother-died 04/29/2023, HTN, DM, CHF, CAD 2 brother - 1 one agent orange related asthma; allergies 1 sister - breast cancer Neg- colon cancer allergic rhinitis Family History of Liver Cancer:Father (mets) Family History of Prostate Cancer:Father   Social History: Last  updated: June 17, 2009 10th grade married - 1965-divorced after 5 yrs; married 48- divorced 2 yrs; married 05/05/74 3 sons - 2067-05-06, 05-05-1974, '28'; 3 daughters - 06-06-206906/04/2070, 05-05-78 grandchildren 10; 2 great-grandchildren 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 Stop 9 yrs ago Environment: House with crawl space, central air conditioning, hard wood.  No feather bedding, no mold.  Son smokes.  Pets including dogs, cats, 3, birds. Angioedema with Aspirin. Alcohol Use - no Daily Caffeine Use two cups a day Illicit Drug Use - no Patient gets regular exercise.  Risk Factors: Caffeine Use: 4 (11/03/2007) Exercise: yes (06-17-2009)  Risk Factors: Smoking Status: quit (07/12/2008) Passive Smoke Exposure: no (11/03/2007)  Review of Systems      See HPI       The patient complains of fever, chest pain, dyspnea on exertion, and prolonged cough.  The patient denies anorexia, weight loss, weight gain, vision loss, decreased hearing, hoarseness, syncope, hemoptysis, abdominal pain, and severe indigestion/heartburn.    Vital Signs:  Patient profile:   65 year old female Height:      63 inches Weight:      233.25 pounds BMI:     41.47 O2 Sat:      95 % on Room air Pulse rate:   84 / minute BP sitting:   118 / 64  (left arm) Cuff size:   large  Vitals  Entered By: Reynaldo Minium CMA (January 09, 2010 3:58 PM)  O2 Flow:  Room air  Physical Exam  Additional Exam:  General: A/Ox3; pleasant and cooperative, NAD,  seriously overweight, calm SKIN: red area under bandaid dorsum left hand where she scraped with her fingernail NODES: no lymphadenopathy HEENT: [place]/AT, EOM- WNL, Conjuctivae- clear, PERRLA, TM-WNL, Nose- clear, Throat- clear and wnl, Melampatti III, dentures,   NECK: Supple w/ fair ROM, JVD- none, normal carotid impulses w/o bruits Thyroid-  CHEST: Coarse BS , wheezey cough - harsh, intermittent HEART: RRR, no m/g/r heard ABDOMEN: Soft  EXB:MWUX, nl pulses, no edema  NEURO: Grossly  intact to observation, head bob tremor      Impression & Recommendations:  Problem # 1:  CHRONIC OBSTRUCTIVE PULMONARY DISEASE, ACUTE EXACERBATION (ICD-491.21)  Question if she may have had a reflux event. Now has worse bronchitis. Will get CXR, neb, depo, change antibiotic.  Medications Added to Medication List This Visit: 1)  Augmentin 875-125 Mg Tabs (Amoxicillin-pot clavulanate) .Marland Kitchen.. 1 two times a day  Other Orders: Est. Patient Level III (32440) T-2 View CXR (71020TC) Admin of Therapeutic Inj  intramuscular or subcutaneous (10272) Depo- Medrol 80mg  (J1040) Nebulizer Tx (53664)  Patient Instructions: 1)  Keep appointmet with Dr Maple Hudson for allergy follow-up in June 2)  neb xop 1.25 3)  depo 80 4)  script for augmentin printed Prescriptions: AUGMENTIN 875-125 MG TABS (AMOXICILLIN-POT CLAVULANATE) 1 two times a day  #14 x 0   Entered and Authorized by:   Waymon Budge MD   Signed by:   Waymon Budge MD on 01/09/2010   Method used:   Print then Give to Patient   RxID:   [id number]      Medication Administration  Injection # 1:    Medication: Depo- Medrol 80mg     Diagnosis: CHRONIC OBSTRUCTIVE PULMONARY DISEASE, ACUTE EXACERBATION (ICD-491.21)    Route: SQ    Site: LUOQ gluteus    Exp Date: 08/2010    Lot #: [id number]    Mfr: Teva    Patient tolerated injection without complications    Given by: Reynaldo Minium CMA (January 09, 2010 4:56 PM)  Medication # 1:    Medication: Xopenex 1.25mg     Diagnosis: CHRONIC OBSTRUCTIVE PULMONARY DISEASE, ACUTE EXACERBATION (ICD-491.21)    Dose: 1 vial     Route: inhaled    Exp Date: 07/2010    Lot #: [id number]    Mfr: Sepracor    Patient tolerated medication without complications    Given by: Reynaldo Minium CMA (January 09, 2010 4:56 PM)  Orders Added: 1)  Est. Patient Level III [30160] 2)  T-2 View CXR [71020TC] 3)  Admin of Therapeutic Inj  intramuscular or subcutaneous [96372] 4)  Depo- Medrol 80mg   [J1040] 5)  Nebulizer Tx [10932]

## 2011-01-01 NOTE — Letter (Signed)
[facility]: [facility]   Imported By: Sherian Rein 02/24/2010 12:25:32  _____________________________________________________________________  External Attachment:    Type:   Image     Comment:   External Document

## 2011-01-01 NOTE — Progress Notes (Signed)
Summary: prescript  Phone Note Call from Patient   Caller: Patient Call For: Weldon Nouri Summary of Call: pt need doxycline prescript called to pharmacy [facility] Initial call taken by: Rickard Patience,  June 12, 2010 3:20 PM  Follow-up for Phone Call        Spoke with pt.  She c/o PND, HA, sinus pressure, cough at night with minimal white sputum.  She states that these are the same symptoms that she had before when called on 05/20/10 and inproved after we called her in doxy.  She states that symptoms started back a few days ago.  Wants an rx for doxy.  Pls advise thanks! allergies: latex, lisinopril, codeine, demerol, lidocaine patch, asa Follow-up by: Vernie Murders,  June 12, 2010 3:35 PM  Additional Follow-up for Phone Call Additional follow up Details #1::        Per CDY- give Doxycycline 100mg  #8 take 2 today then 1 daily no refills.Reynaldo Minium CMA  June 12, 2010 4:06 PM     Additional Follow-up for Phone Call Additional follow up Details #2::    LMTCB Vernie Murders  June 12, 2010 4:15 PM   called and spoke with pt and she is aware of doxy sent to her pharmacy. Randell Loop [facility]  June 12, 2010 4:23 PM   Prescriptions: DOXYCYCLINE HYCLATE 100 MG CAPS (DOXYCYCLINE HYCLATE) take 2 today and then 1 daily until gone.  #8 x 0   Entered by:   Randell Loop CMA   Authorized by:   Waymon Budge MD   Signed by:   Randell Loop CMA on 06/12/2010   Method used:   Electronically to        [facility]. [address] [phone]* (retail)       [address]       [place], [place]  [address]       Ph: [phone] or [phone]       Fax: [phone]   RxID:   [id number]

## 2011-01-01 NOTE — Miscellaneous (Signed)
Summary: previsit WL prep/rm  Clinical Lists Changes  Medications: Added new medication of MOVIPREP 100 GM  SOLR (PEG-KCL-NACL-NASULF-NA ASC-C) As per prep instructions. - Signed Rx of MOVIPREP 100 GM  SOLR (PEG-KCL-NACL-NASULF-NA ASC-C) As per prep instructions.;  #1 x 0;  Signed;  Entered by: Sherren Kerns RN;  Authorized by: Louis Meckel MD;  Method used: Electronically to [facility]. [place]. [phone]*, [address], [place], [place], [place]  [address], Ph: [phone] or [phone], Fax: [phone] Observations: Added new observation of ALLERGY REV: Done (10/20/2010 8:00)    Prescriptions: MOVIPREP 100 GM  SOLR (PEG-KCL-NACL-NASULF-NA ASC-C) As per prep instructions.  #1 x 0   Entered by:   Sherren Kerns RN   Authorized by:   Louis Meckel MD   Signed by:   Sherren Kerns RN on 10/20/2010   Method used:   Electronically to        [facility]. [address] [phone]* (retail)       [address]       [place], [place]  [address]       Ph: [phone] or [phone]       Fax: [phone]   RxID:   [id number]

## 2011-01-01 NOTE — Letter (Signed)
Summary: [facility] Instructions  [facility]  [address] [place], [place] [address]   Phone: [phone]  Fax: [phone]       [name]    67/11/1966    MRN: [id number]        Procedure Day /Date: Tuesday 11[birth year]04[birth year]      Arrival Time: 11:30 p.m.     Procedure Time: 12:30 p.m.     Location of Procedure:                     _x _  [facility] ( Outpatient Registration)                        PREPARATION FOR COLONOSCOPY WITH MOVIPREP   Starting 5 days prior to your procedure  10-30-10 do not eat nuts, seeds, popcorn, corn, beans, peas,  salads, or any raw vegetables.  Do not take any fiber supplements (e.g. Metamucil, Citrucel, and Benefiber).  THE DAY BEFORE YOUR PROCEDURE         DATE:  11-03-10   DAY:  Monday  1.  Drink clear liquids the entire day-NO SOLID FOOD  2.  Do not drink anything colored red or purple.  Avoid juices with pulp.  No orange juice.  3.  Drink at least 64 oz. (8 glasses) of fluid/clear liquids during the day to prevent dehydration and help the prep work efficiently.  CLEAR LIQUIDS INCLUDE: Water Jello Ice Popsicles Tea (sugar ok, no milk/cream) Powdered fruit flavored drinks Coffee (sugar ok, no milk/cream) Gatorade Juice: apple, white grape, white cranberry  Lemonade Clear bullion, consomm, broth Carbonated beverages (any kind) Strained chicken noodle soup Hard Candy                             4.  In the morning, mix first dose of MoviPrep solution:    Empty 1 Pouch A and 1 Pouch B into the disposable container    Add lukewarm drinking water to the top line of the container. Mix to dissolve    Refrigerate (mixed solution should be used within 24 hrs)  5.  Begin drinking the prep at 5:00 p.m. The MoviPrep container is divided by 4 marks.   Every 15 minutes drink the solution down to the next mark (approximately 8 oz) until the full liter is complete.   6.  Follow completed prep with 16 oz of clear  liquid of your choice (Nothing red or purple).  Continue to drink clear liquids until bedtime.  7.  Before going to bed, mix second dose of MoviPrep solution:    Empty 1 Pouch A and 1 Pouch B into the disposable container    Add lukewarm drinking water to the top line of the container. Mix to dissolve    Refrigerate  THE DAY OF YOUR PROCEDURE      DATE:  11-04-10  DAY:  Tuesday  Beginning at  7:30 a.m. (5 hours before procedure):         1. Every 15 minutes, drink the solution down to the next mark (approx 8 oz) until the full liter is complete.  2. Follow completed prep with 16 oz. of clear liquid of your choice.    3. You may drink clear liquids until   8:30 a.m. (4 HOURS BEFORE PROCEDURE).   MEDICATION INSTRUCTIONS  Unless otherwise instructed, you should take regular prescription medications with a  small sip of water   as early as possible the morning of your procedure.  Diabetic patients - see separate instructions.   Additional medication instructions:  Stop Iron pill on 10-28-10,7 days prior to procedure         OTHER INSTRUCTIONS  You will need a responsible adult at least 67 years of age to accompany you and drive you home.   This person must remain in the waiting room during your procedure.  Wear loose fitting clothing that is easily removed.  Leave jewelry and other valuables at home.  However, you may wish to bring a book to read or  an iPod/MP3 player to listen to music as you wait for your procedure to start.  Remove all body piercing jewelry and leave at home.  Total time from sign-in until discharge is approximately 2-3 hours.  You should go home directly after your procedure and rest.  You can resume normal activities the  day after your procedure.  The day of your procedure you should not:   Drive   Make legal decisions   Operate machinery   Drink alcohol   Return to work  You will receive specific instructions about eating, activities  and medications before you leave.    The above instructions have been reviewed and explained to me by   Sherren Kerns RN  October 20, 2010 8:36 AM     I fully understand and can verbalize these instructions _____________________________ Date _________

## 2011-01-01 NOTE — Letter (Signed)
Summary: [facility] Instructions  [facility]  [address] [place], [place] [address]   Phone: [phone]  Fax: [phone]       [name]    1946/11/01    MRN: [id number]        Procedure Day Dorna Bloom:  Duanne Limerick  11/03/10     Arrival Time: 7:30AM      Procedure Time:  8:30AM     Location of Procedure:                    _  X_  [facility] (4th Floor)                       PREPARATION FOR COLONOSCOPY WITH MOVIPREP   Starting 5 days prior to your procedure 10/29/10 do not eat nuts, seeds, popcorn, corn, beans, peas,  salads, or any raw vegetables.  Do not take any fiber supplements (e.g. Metamucil, Citrucel, and Benefiber).  THE DAY BEFORE YOUR PROCEDURE         DATE: 11/02/10   DAY: SUNDAY  1.  Drink clear liquids the entire day-NO SOLID FOOD  2.  Do not drink anything colored red or purple.  Avoid juices with pulp.  No orange juice.  3.  Drink at least 64 oz. (8 glasses) of fluid/clear liquids during the day to prevent dehydration and help the prep work efficiently.  CLEAR LIQUIDS INCLUDE: Water Jello Ice Popsicles Tea (sugar ok, no milk/cream) Powdered fruit flavored drinks Coffee (sugar ok, no milk/cream) Gatorade Juice: apple, white grape, white cranberry  Lemonade Clear bullion, consomm, broth Carbonated beverages (any kind) Strained chicken noodle soup Hard Candy                             4.  In the morning, mix first dose of MoviPrep solution:    Empty 1 Pouch A and 1 Pouch B into the disposable container    Add lukewarm drinking water to the top line of the container. Mix to dissolve    Refrigerate (mixed solution should be used within 24 hrs)  5.  Begin drinking the prep at 5:00 p.m. The MoviPrep container is divided by 4 marks.   Every 15 minutes drink the solution down to the next mark (approximately 8 oz) until the full liter is complete.   6.  Follow completed prep with 16 oz of clear liquid of your choice  (Nothing red or purple).  Continue to drink clear liquids until bedtime.  7.  Before going to bed, mix second dose of MoviPrep solution:    Empty 1 Pouch A and 1 Pouch B into the disposable container    Add lukewarm drinking water to the top line of the container. Mix to dissolve    Refrigerate  THE DAY OF YOUR PROCEDURE      DATE: 11/03/10   DAY: MONDAY  Beginning at 3:30AM (5 hours before procedure):         1. Every 15 minutes, drink the solution down to the next mark (approx 8 oz) until the full liter is complete.  2. Follow completed prep with 16 oz. of clear liquid of your choice.    3. You may drink clear liquids until 6:30AM (2 HOURS BEFORE PROCEDURE).   MEDICATION INSTRUCTIONS  Unless otherwise instructed, you should take regular prescription medications with a small sip of water   as early as possible  the morning of your procedure.  Diabetic patients - see separate instructions.   Additional medication instructions: n/a         OTHER INSTRUCTIONS  You will need a responsible adult at least 67 years of age to accompany you and drive you home.   This person must remain in the waiting room during your procedure.  Wear loose fitting clothing that is easily removed.  Leave jewelry and other valuables at home.  However, you may wish to bring a book to read or  an iPod/MP3 player to listen to music as you wait for your procedure to start.  Remove all body piercing jewelry and leave at home.  Total time from sign-in until discharge is approximately 2-3 hours.  You should go home directly after your procedure and rest.  You can resume normal activities the  day after your procedure.  The day of your procedure you should not:   Drive   Make legal decisions   Operate machinery   Drink alcohol   Return to work  You will receive specific instructions about eating, activities and medications before you leave.    The above instructions have been reviewed  and explained to me by   _______________________    I fully understand and can verbalize these instructions _____________________________ Date _________   Appended Document: Moviprep Instructions CHANGED TO HOS PROCEDURE ON 12/6

## 2011-01-01 NOTE — Procedures (Signed)
Summary: Colonoscopy  Patient: [name] Note: All result statuses are Final unless otherwise noted.  Tests: (1) Colonoscopy (COL)   COL Colonoscopy           DONE     [facility]     [address] [place], [place]  [address]           COLONOSCOPY PROCEDURE REPORT           PATIENT:  Isabella, [name]  MR#:  [id number]     BIRTHDATE:  1946-01-07, 64 yrs. old  GENDER:  female           ENDOSCOPIST:  Barbette Hair. Arlyce Dice, MD     Referred by:           PROCEDURE DATE:  11/04/2010     PROCEDURE:  Colonoscopy with polypectomy and submucosal injection,     Colonoscopy with tumor ablation     ASA CLASS:  Class II     INDICATIONS:  1) screening  2) history of pre-cancerous     (adenomatous) colon polyps Index polypectomy 7/10           MEDICATIONS:   Fentanyl 100 mcg IV, Versed 14 mg IV           DESCRIPTION OF PROCEDURE:   After the risks benefits and     alternatives of the procedure were thoroughly explained, informed     consent was obtained.  Digital rectal exam was performed and     revealed no abnormalities.   The  endoscope was introduced through     the anus and advanced to the cecum, which was identified by both     the appendix and ileocecal valve, without limitations.  The     quality of the prep was excellent, using MoviPrep.  The instrument     was then slowly withdrawn as the colon was fully examined.     <<PROCEDUREIMAGES>>           FINDINGS:  A sessile polyp was found in the ascending colon. It     was 15 mm in size. submucosal injection 6cc NS followed by     piecemeal hot polypectomy (see image3). Remnants were fulgurated     with the APC  Mild diverticulosis was found in the sigmoid to     descending colon segments (see image7).  Internal hemorrhoids were     found (see image8).  This was otherwise a normal examination of     the colon (see image2 and image6).   Retroflexed views in the     rectum revealed no abnormalities.    The time to cecum =   minutes.     The scope was then withdrawn (time =  35.0  min) from the patient     and the procedure completed.           COMPLICATIONS:  None           ENDOSCOPIC IMPRESSION:     1) 15 mm sessile polyp in the ascending colon     2) Mild diverticulosis in the sigmoid to descending colon     segments     3) Internal hemorrhoids     4) Otherwise normal examination     RECOMMENDATIONS:     1) Colonoscopy     2) CBC           REPEAT EXAM:   3 year(s) Colonoscopy with ERBE  ______________________________     Barbette Hair Arlyce Dice, MD           CC: Jacques Navy, MD           n.     Rosalie DoctorBarbette Hair. Kaplan at 11/04/2010 02:19 PM           Ananias Pilgrim, [id number]  Note: An exclamation mark (!) indicates a result that was not dispersed into the flowsheet. Document Creation Date: 11/04/2010 2:19 PM _______________________________________________________________________  (1) Order result status: Final Collection or observation date-time: 11/04/2010 14:13 Requested date-time:  Receipt date-time:  Reported date-time:  Referring Physician:   Ordering Physician: Melvia Heaps [phone]) Specimen Source:  Source: Launa Grill Order Number: [id number] Lab site:   Appended Document: Colonoscopy    Clinical Lists Changes  Observations: Added new observation of COLONNXTDUE: 10/30/2013 (11/06/2010 9:01)

## 2011-01-01 NOTE — Procedures (Signed)
Summary: Instructions for procedure/[place]  Instructions for procedure/[place]   Imported By: Sherian Rein 10/22/2010 08:29:17  _____________________________________________________________________  External Attachment:    Type:   Image     Comment:   External Document

## 2011-01-01 NOTE — Miscellaneous (Signed)
Summary: Injection Record/[facility]  Injection Record/[facility]   Imported By: Sherian Rein 04/22/2010 12:40:00  _____________________________________________________________________  External Attachment:    Type:   Image     Comment:   External Document

## 2011-01-01 NOTE — Letter (Signed)
Summary: Colonoscopy Letter  [facility]  [address] [place], [place] [address]   Phone: [phone]  Fax: [phone]      July 02, 2010 MRN: [id number]   [facility] [address] [place], [place]  [address]   Dear Ms. Hammersmith,   According to your medical record, it is time for you to schedule a Colonoscopy. The American Cancer Society recommends this procedure as a method to detect early colon cancer. Patients with a family history of colon cancer, or a personal history of colon polyps or inflammatory bowel disease are at increased risk.  This letter has been generated based on the recommendations made at the time of your procedure. If you feel that in your particular situation this may no longer apply, please contact our office.  Please call our office at [phone] to schedule this appointment or to update your records at your earliest convenience.  Thank you for cooperating with [place] to provide you with the very best care possible.   Sincerely,   Barbette Hair. Arlyce Dice, M.D.  [facility] [phone]

## 2011-01-01 NOTE — Assessment & Plan Note (Signed)
Summary: rov 6 months ///kp   Copy to:  n/a Primary Provider/Referring Provider:  Illene Regulus, MD  CC:  6 month follow up visit-alleriges;doing good.Marland Kitchen  History of Present Illness:  November 11, 2009- Food Allergy, allergic rhinitis, Asthma/COPD (MR) Breathing is better- she says she no longer gets short of breath.  Reviewed meds.Spiriva helped. She continues to do well with her allergy vaccine.  January 03, 2010 ---Presents for an acute ofifce visit. Complains of dyspnea, some wheezing, tightness in chest, dry cough, back aches, sore throat, HA, increased fatigue/weakness x1.5weeks - denies fc/s Of note she remains on  macrodant for chornic UTI. OTC not working. Denies chest pain,  orthopnea, hemoptysis, fever, n/v/d, edema, headache.   January 09, 2010-  Chest cold onset 2 weeks. Took doxy from NP without help. Head hurts, cough green, fever. Feels rattle this afternoon but now nonproductive. Some nausea and loose stoools. Hurt across back and chest with some choking sensation 2 nights ago- suggestivre of a reflux event. Some hurting around under left lower lateral ribs- tussive but not pleuritic otherwise.  May 12, 2010- Food allergy, allergic rhinitis, Asthma/ COPD Breathing doing well. Nasal drip which improves for a while after she takes doxycycline. Now wearing a boot after fell breaking left ankle- no surgery. Avoids problem foods successfully. Denies significant pollen rhinitis this Spring.  Uses rescue inhaler rarely and does well outside unless windy or exposed to cigrettes.   Asthma History    Initial Asthma Severity Rating:    Age range: 12+ years    Symptoms: 0-2 days/week    Nighttime Awakenings: 0-2/month    Interferes w/ normal activity: no limitations    SABA use (not for EIB): 0-2 days/week    Asthma Severity Assessment: Intermittent   Preventive Screening-Counseling & Management  Alcohol-Tobacco     Smoking Status: quit     Year Started: 1955  Year Quit: 1997  Current Medications (verified): 1)  Symbicort 80-4.5 Mcg/act Aero (Budesonide-Formoterol Fumarate) .... 2 Puffs Two Times A Day 2)  Veramyst 27.5 Mcg/spray Susp (Fluticasone Furoate) .... 2 Sprays Daily 3)  Astelin 137 Mcg/spray  Soln (Azelastine Hcl) .Marland Kitchen.. 1 Puff Each Nostril At Bedtime 4)  Allergy Vaccine 1:10 Gh .... Advance Next Order From 1:50 To 1:10 Per Protocol 5)  Proair Hfa 108 (90 Base) Mcg/act Aers (Albuterol Sulfate) .... Inhale 2 Puffs Every 4-6 Hours As Needed For Shortness of Breath 6)  Metformin Hcl 1000 Mg  Tabs (Metformin Hcl) .... Take One Tablet Twice Daily 7)  Levoxyl 75 Mcg  Tabs (Levothyroxine Sodium) .... Take One Tablet Once Daily 8)  Lantus 100 Unit/ml  Soln (Insulin Glargine) .... 25units Once Daily 9)  Calcium Carbonate-Vitamin D 600-400 Mg-Unit  Tabs (Calcium Carbonate-Vitamin D) .... Take 1 Tablet By Mouth Two Times A Day 10)  Hydrocodone-Acetaminophen 5-500 Mg  Tabs (Hydrocodone-Acetaminophen) .Marland Kitchen.. 1 Three Times A Day 11)  Aciphex 20 Mg  Tbec (Rabeprazole Sodium) .... Take 1 Tablet By Mouth Once A Day 12)  Amlodipine Besylate 5 Mg  Tabs (Amlodipine Besylate) .... Take 1 Tablet By Mouth Once A Day 13)  Venlafaxine Hcl 37.5 Mg Tabs (Venlafaxine Hcl) .... Take 2 Tablet By Mouth Once A Day 14)  Diovan 160 Mg Tabs (Valsartan) .Marland Kitchen.. 1 Tab Once Daily/ 15)  Simvastatin 40 Mg Tabs (Simvastatin) .... Take 1 Tablet By Mouth Once A Day 16)  Eye Vitamins   Caps (Multiple Vitamins-Minerals) .... Take One Tablet Once Daily 17)  Multivitamins   Tabs (Multiple  Vitamin) .... Take 1 Tablet By Mouth Once A Day 18)  Cranberry 500 Mg Caps (Cranberry) .... Take 1 Capsule By Mouth Once A Day 19)  Excedrin Extra Strength 250-250-65 Mg Tabs (Aspirin-Acetaminophen-Caffeine) .... Per Bottle As Needed 20)  Cvs Saline Nasal Spray 0.65 % Soln (Saline) .... 2 Puffs Every 4 Hours As Needed 21)  Bd Integra Insulin Syringe 29g X 1/2" 1 Ml  Misc (Insulin Syringe-Needle U-100) ....  #30 For Use With Lantus 22)  Novofine 30g X 8 Mm  Misc (Insulin Pen Needle) .... Use With Novolog Pen 23)  Accu-Chek Aviva  Strp (Glucose Blood) .... Use 1 Test Strip Every Cbg Check and Check Cbg 4 Times A Day 24)  Sudafed 12 Hour 120 Mg Xr12h-Tab (Pseudoephedrine Hcl) .... As Needed 25)  Zetia 10 Mg Tabs (Ezetimibe) .... Take 1 Tablet By Mouth Once A Day 26)  Iron 325 (65 Fe) Mg Tabs (Ferrous Sulfate) .Marland Kitchen.. 1 By Mouth Once Daily 27)  Spiriva Handihaler 18 Mcg Caps (Tiotropium Bromide Monohydrate) .Marland Kitchen.. 1 Inhalation Daily 28)  Klonopin 1 Mg Tabs (Clonazepam) .Marland Kitchen.. 1-2 By Mouth At Bedtime As Needed For Insomnia 29)  Nitrofurantoin Monohyd Macro 100 Mg Caps (Nitrofurantoin Monohyd Macro) .... Take 1 By Mouth At Bedtime 30)  Albuterol Sulfate (2.5 Mg/3ml) 0.083% Nebu (Albuterol Sulfate) .... Four Times A Day As Needed For Sob 31)  Lancets of Pt's Choice .... Dx:250.00 Insulin Dependent Test Three Times A Day As Needed  Allergies (verified): 1)  ! Lisinopril 2)  ! Codeine 3)  ! Demerol 4)  ! Asa 5)  ! 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) FOOT SURGERY, HX OF (ICD-V15.89) HYSTERECTOMY, HX OF (ICD-V45.77) HYPERLIPIDEMIA (ICD-272.4) DEPRESSION (ICD-311) HYPERTENSION (ICD-401.9) HYPOTHYROIDISM (ICD-244.9) DIABETES MELLITUS (ICD-250.00) SINUSITIS (ICD-473.9) ALLERGIC RHINITIS (ICD-477.9) ASTHMA (ICD-493.90) POSTNASAL DRIP (ICD-784.91) CONJUNCTIVITIS, MILD (ICD-372.30) ALLERGY, FOOD (ICD-693.1) C O P D (ICD-496) COUGH (ICD-786.2) BRONCHITIS, ACUTE (ICD-466.0) Neg cardiac sress test - 08/21/2008  Past Surgical History: Last updated: 08/12/2009 FOOT SURGERY, HX OF (ICD-V15.89) hammer toe  '04,  'May 02, 2023 HYSTERECTOMY, HX OF (ICD-V45.77) 05-08-1981, menometorrhagia Carpaal tunnnel bilateral....................Marland KitchenDr. Merlyn Lot -> 03/05/2009  Family History: Last updated: 06/20/09 father-died 08-May-2065, lung cancer, HTN, DM mother-died 2023/05/02, HTN, DM, CHF, CAD 2 brother - 1 one agent orange related asthma; allergies 1 sister - breast cancer Neg- colon cancer allergic rhinitis Family History of Liver Cancer:Father (mets) Family History of Prostate Cancer:Father   Social History: Last updated: 06-20-2009 10th grade married - 1965-divorced after 5 yrs; married 47- divorced 2 yrs; married 08-May-1974 3 sons - May 09, 2067, 1974-05-08, '46'; 3 daughters - 05-08-6905/09/71, 05/08/78 grandchildren 10; 2 great-grandchildren 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 Stop 9 yrs ago Environment: House with crawl space, central air conditioning, hard wood.  No feather bedding, no mold.  Son smokes.  Pets including dogs, cats, 3, birds. Angioedema with Aspirin. Alcohol Use - no Daily Caffeine Use two cups a day Illicit Drug Use - no Patient gets regular exercise.  Risk Factors: Caffeine Use: 4 (11/03/2007) Exercise: yes (20-Jun-2009)  Risk Factors: Smoking Status: quit (05/12/2010) Passive Smoke Exposure: no (11/03/2007)  Review of Systems      See HPI  The patient denies shortness of breath with activity, shortness of breath at rest, productive cough, non-productive cough,  coughing up blood, chest pain, irregular heartbeats, acid heartburn, indigestion, loss of appetite, weight change, abdominal pain, difficulty swallowing, sore throat, tooth/dental problems, headaches, nasal congestion/difficulty breathing through nose, and sneezing.    Vital Signs:  Patient profile:   65 year old female Height:      63 inches Weight:      231 pounds BMI:     41.07 O2 Sat:      98 % on Room air Pulse rate:   70 / minute BP sitting:   122 / 64  (left arm) Cuff size:   large  Vitals Entered By: Reynaldo Minium CMA (May 12, 2010 2:14  PM)  O2 Flow:  Room air CC: 6 month follow up visit-alleriges;doing good.   Physical Exam  Additional Exam:  General: A/Ox3; pleasant and cooperative, NAD,  seriously overweight, calm SKIN: clear NODES: no lymphadenopathy HEENT: [place]/AT, EOM- WNL, Conjuctivae- clear, PERRLA, TM-WNL, Nose- clear, Throat- clear and wnl, Mallampati III, dentures,   NECK: Supple w/ fair ROM, JVD- none, normal carotid impulses w/o bruits Thyroid-  CHEST: Clear to P&A HEART: RRR, no m/g/r heard ABDOMEN: Soft  ZOX:WRUE, nl pulses, no edema  NEURO: Grossly intact to observation, head bob tremor      Impression & Recommendations:  Problem # 1:  SINUSITIS (ICD-473.9) We will leave her a refillable script for doxycycline for ocasional use since it helps  Problem # 2:  ASTHMA (ICD-493.90) Good control on current meds which were reviewed. she is finishing a pred taper. Steroid side effects discussion  Continues allergy vaccine  Problem # 3:  C O P D (ICD-496) She avoids problem foods and is withut new complaint.  Other Orders: Est. Patient Level IV (45409)  Patient Instructions: 1)  Please schedule a follow-up appointment in 6 months.

## 2011-01-01 NOTE — Progress Notes (Signed)
Summary: rx req/ sinus---  Phone Note Call from Patient   Caller: Patient Call For: young Summary of Call: pt c/o sinus headache x 4 days. requests rx for doxycycline. call cell [phone]- [facility] [address] - SORRY- HAD THIS IN MY BOX UNTIL NOW.  Initial call taken by: Tivis Ringer, CNA,  September 08, 2010 9:49 AM  Follow-up for Phone Call        ATC pt on cell #.  Was told "the telephone # you have called is temporarily unavailable."    therefore LMOM at pt's home # TCB  [facility] LPN  September 08, 2010 2:52 PM   atc pt on cell # but received message state "the telephone # you have called is temporarily unavailable."  Therefore, called pt's home # - [facility] Gweneth Dimitri RN  September 09, 2010 5:38 PM   Additional Follow-up for Phone Call Additional follow up Details #1::        Pt c/o sinus pressure, sore throat, productive cough with clear phlegm,andear pain x 1.5 weeks. Please advise. Carron Curie CMA  September 10, 2010 3:51 PM allergies: allergies: latex, lisinopril, codeine, demerol, lidocaine patch, asa  per CY give zpak. Carron Curie CMA  September 11, 2010 9:55 AM  Pt advised rx sent.Carron Curie CMA  September 11, 2010 10:09 AM     New/Updated Medications: ZITHROMAX Z-PAK 250 MG TABS (AZITHROMYCIN) as directed Prescriptions: ZITHROMAX Z-PAK 250 MG TABS (AZITHROMYCIN) as directed  #1 pak x 0   Entered by:   Carron Curie CMA   Authorized by:   Waymon Budge MD   Signed by:   Carron Curie CMA on 09/11/2010   Method used:   Electronically to        [facility] [address] [phone]* (retail)       [address]       [place], [place]  [address]       Ph: [phone] or [phone]       Fax: [phone]   RxID:   [id number]

## 2011-01-01 NOTE — Progress Notes (Signed)
       New/Updated Medications: LANTUS 100 UNIT/ML  SOLN (INSULIN GLARGINE) 40 units once daily Prescriptions: LANTUS 100 UNIT/ML  SOLN (INSULIN GLARGINE) 40 units once daily  #3 x 3   Entered by:   Ami Bullins CMA   Authorized by:   Jacques Navy MD   Signed by:   Bill Salinas CMA on 08/08/2010   Method used:   Electronically to        [facility] [address] [phone]* (retail)       [address]       [place], [place]  [address]       Ph: [phone] or [phone]       Fax: [phone]   RxID:   [id number]

## 2011-01-01 NOTE — Miscellaneous (Signed)
Summary: Injection Record / [facility]    Injection Record / [facility]    Imported By: Lennie Odor 08/01/2010 10:23:11  _____________________________________________________________________  External Attachment:    Type:   Image     Comment:   External Document

## 2011-01-01 NOTE — Progress Notes (Signed)
Summary: sick - doxy rx  Phone Note Call from Patient Call back at [facility] Phone [phone]   Caller: Patient Call For: young Reason for Call: Talk to Nurse Summary of Call: pt wants doxycycline called in - has another sinus infection, congestion in head w/lots of drainage. [facility] Initial call taken by: Eugene Gavia,  August 14, 2010 8:37 AM  Follow-up for Phone Call        Called, spoke with pt.  She c/o PND, cough with thick, white mucus, blowing nose - thick, white mucus, head/chest congestion, and sinus pressure x 1 wk.  Chest tightness yesterday.  Fever up to 102.   Allergies: lisinopril, codeine, demerol, asa, latex, lidocaine patch [facility] [address] [place], pls advise.  Thanks! Follow-up by: Gweneth Dimitri RN,  August 14, 2010 9:05 AM  Additional Follow-up for Phone Call Additional follow up Details #1::        Per CDY- give Doxycycline 100mg  #8 take 2 today then 1 daily no refills and OTC Mucinex DM.Reynaldo Minium CMA  August 14, 2010 9:54 AM   Called, spokel with pt.  She was informed of above recs per CY and aware doxy rx sent to [facility].  She verbalized understanding of instructions.   Additional Follow-up by: Gweneth Dimitri RN,  August 14, 2010 10:00 AM    New/Updated Medications: DOXYCYCLINE HYCLATE 100 MG CAPS (DOXYCYCLINE HYCLATE) take 2 today then 1 once daily Prescriptions: DOXYCYCLINE HYCLATE 100 MG CAPS (DOXYCYCLINE HYCLATE) take 2 today then 1 once daily  #8 x 0   Entered by:   Gweneth Dimitri RN   Authorized by:   Waymon Budge MD   Signed by:   Gweneth Dimitri RN on 08/14/2010   Method used:   Electronically to        [facility] [address] [phone]* (retail)       [address]       [place], [place]  [address]       Ph: [phone] or [phone]       Fax: [phone]   RxID:   [id number]

## 2011-01-01 NOTE — Progress Notes (Signed)
  Phone Note Call from Patient   Summary of Call: Pt has acu check aviva and is req rx for needles and strips for machine.  Initial call taken by: Lamar Sprinkles, CMA,  Apr 29, 2010 1:37 PM    New/Updated Medications: * LANCETS OF PT'S CHOICE dx:250.00 insulin dependent test three times a day as needed Prescriptions: LANCETS OF PT'S CHOICE dx:250.00 insulin dependent test three times a day as needed  #90mth x 3   Entered by:   Lamar Sprinkles, CMA   Authorized by:   Jacques Navy MD   Signed by:   Lamar Sprinkles, CMA on 04/29/2010   Method used:   Faxed to ...       [facility] [address] [phone]* (retail)       [address]       [place], [place]  [address]       Ph: [phone] or [phone]       Fax: [phone]   RxID:   [id number]

## 2011-01-01 NOTE — Progress Notes (Signed)
Summary: sinus infection - no better w/ zpak  Phone Note Call from [name]   Caller: [name] Call For: young Summary of Call: need antibiotic for sinus infection [facility] [address] Initial call taken by: Rickard Patience,  September 24, 2010 8:49 AM  Follow-up for Phone Call        called spoke with [name] who c/o sinus pressure/congestion, PND with prod cough w/ yellow mucus, increased SOB.  states is no better since finishing the zpak this past friday.  is requesting doxycycline.  please advise, thanks!  ALLERGIES:  1)  ! Lisinopril 2)  ! Codeine 3)  ! Demerol 4)  ! Asa 5)  ! * Latex 6)  * Lidocaine Patch  Follow-up by: Boone Master CNA/MA,  September 24, 2010 9:22 AM  Additional Follow-up for Phone Call Additional follow up Details #1::        Per CDY-give [name] Doxycycline 100mg  #8 take 2 today then 1 daily until gone. no refills.Reynaldo Minium CMA  September 24, 2010 10:51 AM   Rx sent. pt aware.Carron Curie CMA  September 24, 2010 11:03 AM     New/Updated Medications: DOXYCYCLINE HYCLATE 100 MG TABS (DOXYCYCLINE HYCLATE) take 2 today  then one daily until gone Prescriptions: DOXYCYCLINE HYCLATE 100 MG TABS (DOXYCYCLINE HYCLATE) take 2 today  then one daily until gone  #8 x 8   Entered by:   Carron Curie CMA   Authorized by:   Waymon Budge MD   Signed by:   Carron Curie CMA on 09/24/2010   Method used:   Electronically to        [facility] [address] [phone]* (retail)       [address]       [place], [place]  [address]       Ph: [phone] or [phone]       Fax: [phone]   RxID:   [id number]

## 2011-01-01 NOTE — Assessment & Plan Note (Signed)
Summary: PLACE ON SIDE TO LOOK AT/PAIN MEDS/NWS   Vital Signs:  Patient profile:   65 year old female Height:      63 inches Weight:      223 pounds BMI:     39.65 O2 Sat:      98 % on Room air Temp:     97.9 degrees F oral BP sitting:   122 / 60  (left arm) Cuff size:   large  Vitals Entered By: Ami Bullins CMA (December 19, 2010 3:58 PM)  O2 Flow:  Room air CC: pt c/o pain and soreness under left rib cage area x 6 months/ ab   Primary Care Provider:  Illene Regulus, MD  CC:  pt c/o pain and soreness under left rib cage area x 6 months/ ab.  History of Present Illness: Patient presents for refill on hydrocodone.   She reports that she has a swollen place on the left chest just below the breast that is swollen and tender for several months. There is also a tender place slightly lower on the rib cage. She has not treid any otc treatments.No limitation in activities.   She reports that she is othewise doing well: bloood sugar is well controlled. blood pressure has been well controlled, breathing is good.  Current Medications (verified): 1)  Symbicort 80-4.5 Mcg/act Aero (Budesonide-Formoterol Fumarate) .... 2 Puffs Two Times A Day 2)  Astelin 137 Mcg/spray  Soln (Azelastine Hcl) .Marland Kitchen.. 1 Puff Each Nostril At Bedtime 3)  Allergy Vaccine 1:10 Gh .... Advance Next Order From 1:50 To 1:10 Per Protocol 4)  Proair Hfa 108 (90 Base) Mcg/act Aers (Albuterol Sulfate) .... Inhale 2 Puffs Every 4-6 Hours As Needed For Shortness of Breath 5)  Metformin Hcl 1000 Mg  Tabs (Metformin Hcl) .... Take One Tablet Twice Daily 6)  Levoxyl 75 Mcg  Tabs (Levothyroxine Sodium) .... Take One Tablet Once Daily 7)  Lantus 100 Unit/ml  Soln (Insulin Glargine) .... 40 Units Once Daily 8)  Calcium Carbonate-Vitamin D 600-400 Mg-Unit  Tabs (Calcium Carbonate-Vitamin D) .... Take 1 Tablet By Mouth Two Times A Day 9)  Hydrocodone-Acetaminophen 5-500 Mg  Tabs (Hydrocodone-Acetaminophen) .Marland Kitchen.. 1 Three Times A  Day 10)  Aciphex 20 Mg  Tbec (Rabeprazole Sodium) .... Take 1 Tablet By Mouth Once A Day 11)  Venlafaxine Hcl 75 Mg Tabs (Venlafaxine Hcl) .Marland Kitchen.. 1 By Mouth Two Times A Day 12)  Diovan 160 Mg Tabs (Valsartan) .Marland Kitchen.. 1 Tab Once Daily/ 13)  Simvastatin 40 Mg Tabs (Simvastatin) .... Take 1 Tablet By Mouth Once A Day 14)  Eye Vitamins   Caps (Multiple Vitamins-Minerals) .... Take One Tablet Once Daily 15)  Multivitamins   Tabs (Multiple Vitamin) .... Take 1 Tablet By Mouth Once A Day 16)  Cranberry 500 Mg Caps (Cranberry) .... Take 1 Capsule By Mouth Once A Day 17)  Excedrin Extra Strength 250-250-65 Mg Tabs (Aspirin-Acetaminophen-Caffeine) .... Per Bottle As Needed 18)  Cvs Saline Nasal Spray 0.65 % Soln (Saline) .... 2 Puffs Every 4 Hours As Needed 19)  Bd Integra Insulin Syringe 29g X 1/2" 1 Ml  Misc (Insulin Syringe-Needle U-100) .... #30 For Use With Lantus 20)  Novofine 30g X 8 Mm  Misc (Insulin Pen Needle) .... Use With Novolog Pen 21)  Accu-Chek Aviva  Strp (Glucose Blood) .... Use 1 Test Strip Every Cbg Check and Check Cbg 4 Times A Day 22)  Sudafed 12 Hour 120 Mg Xr12h-Tab (Pseudoephedrine Hcl) .... As Needed 23)  Zetia 10  Mg Tabs (Ezetimibe) .... Take 1 Tablet By Mouth Once A Day 24)  Iron 325 (65 Fe) Mg Tabs (Ferrous Sulfate) .Marland Kitchen.. 1 By Mouth Once Daily 25)  Spiriva Handihaler 18 Mcg Caps (Tiotropium Bromide Monohydrate) .Marland Kitchen.. 1 Inhalation Daily 26)  Klonopin 1 Mg Tabs (Clonazepam) .Marland Kitchen.. 1-2 By Mouth At Bedtime As Needed For Insomnia 27)  Nitrofurantoin Monohyd Macro 100 Mg Caps (Nitrofurantoin Monohyd Macro) .... Take 1 By Mouth At Bedtime 28)  Albuterol Sulfate (2.5 Mg/68ml) 0.083% Nebu (Albuterol Sulfate) .... Four Times A Day As Needed For Sob 29)  Lancets of Pt's Choice .... Dx:250.00 Insulin Dependent Test Three Times A Day As Needed 30)  Furosemide 40 Mg Tabs (Furosemide) .Marland Kitchen.. 1 By Mouth Once Daily For Htn  Allergies (verified): 1)  ! Lisinopril 2)  ! Codeine 3)  ! Demerol 4)  !  Asa 5)  ! * Latex 6)  * Lidocaine Patch  Past History:  Past Surgical History: Last updated: 08/12/2009 FOOT SURGERY, HX OF (ICD-V15.89) hammer toe  '04, 'Apr 19, 2023 HYSTERECTOMY, HX OF (ICD-V45.77) 1981/04/25, menometorrhagia Carpaal tunnnel bilateral....................Marland KitchenDr. Merlyn Lot -> 03/05/2009  Family History: Last updated: 06/07/2009 father-died 04-25-2065, lung cancer, HTN, DM mother-died 2023/04/19, HTN, DM, CHF, CAD 2 brother - 1 one agent orange related asthma; allergies 1 sister - breast cancer Neg- colon cancer allergic rhinitis Family History of Liver Cancer:Father (mets) Family History of Prostate Cancer:Father   Social History: Last updated: 09/15/2010 10th grade married - 1965-divorced after 5 yrs; married 46- divorced 2 yrs; married 1975 3 sons - 2067-04-26, Apr 25, 1974, 04/25/76'; 3 daughters - 2069/03/2704/27/71, 25-Apr-1978 grandchildren 10; 2 great-grandchildren 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  Patient states former smoker. Stop 9 yrs ago Environment: House with crawl space, central air conditioning, hard wood.  No feather bedding, no mold.  Son smokes.  Pets including dogs, cats, 3, birds. Angioedema with Aspirin. Alcohol Use - no Daily Caffeine Use two cups a day Illicit Drug Use - no Patient gets regular exercise.  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  The patient denies anorexia, fever, weight loss,  weight gain, vision loss, syncope, dyspnea on exertion, peripheral edema, abdominal pain, severe indigestion/heartburn, muscle weakness, difficulty walking, enlarged lymph nodes, and angioedema.    Physical Exam  General:  overweight white woman in no acute distress Head:  normocephalic and atraumatic.   Eyes:  pupils equal and pupils round.   Neck:  supple and full ROM.   Chest Wall:  tender on and inbetween  ribs 6-7 midclavicular line left. Tender over rib 9-10 midclavicular line left. Lungs:  normal respiratory effort and normal breath sounds.   Heart:  normal rate and regular rhythm.   Abdomen:  obese soft Pulses:  2+ radial  Neurologic:  alert & oriented X3, cranial nerves II-XII intact, strength normal in all extremities, and gait normal.   Skin:  turgor normal and color normal.   Psych:  Oriented X3, normally interactive, and good eye contact.     Impression & Recommendations:  Problem # 1:  CHEST PAIN, LEFT (ICD-786.50) Patient with persistent chest wall pain with no palpable abnormality on exam. Suspect intercostal strain vs fx rib vs neuropathy.  Plan - CXR with rib details to r/o injury.  Orders: T-1 View CXR (71010TC)  Addendum- x-rays are normal with no lung abnormality or rib cage abnormality  Plan - topical treatment with lineament of choice.  Problem # 2:  ANEMIA, IRON DEFICIENCY (ICD-280.9) Iron levels are stable.  Her updated medication list for this problem includes:    Iron 325 (65 Fe) Mg Tabs (Ferrous sulfate) .Marland Kitchen... 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  Her updated medication list for this problem includes:    Simvastatin 40 Mg Tabs (Simvastatin) .Marland Kitchen... Take 1 tablet by mouth once a day    Zetia 10 Mg Tabs (Ezetimibe)  .Marland Kitchen... Take 1 tablet by mouth once a day  Orders: TLB-Lipid Panel (80061-LIPID) TLB-Hepatic/Liver Function Pnl (80076-HEPATIC)  Addendum- LDL 68 - great control on present regimen.  Problem # 5:  HYPERTENSION (ICD-401.9)  Her updated medication list for this problem includes:    Diovan 160 Mg Tabs (Valsartan) .Marland Kitchen... 1 tab once daily/    Furosemide 40 Mg Tabs (Furosemide) .Marland Kitchen... 1 by mouth once daily for htn  Orders: TLB-BMP (Basic Metabolic Panel-BMET) (80048-METABOL)  BP today: 122/60 Prior BP: 130/68 (11/20/2010)  Prior 10 Yr Risk Heart Disease: 11 % (11/11/2009)  Very good control on present regimen. Plan is to continue present medications.  Problem # 6:  DIABETES MELLITUS (ICD-250.00) Due for A1C with recommendations to follow.  Her updated medication list for this problem includes:    Metformin Hcl 1000 Mg Tabs (Metformin hcl) .Marland Kitchen... Take one tablet twice daily    Lantus 100 Unit/ml Soln (Insulin glargine) .Marland KitchenMarland KitchenMarland KitchenMarland Kitchen 40 units once daily    Diovan 160 Mg Tabs (Valsartan) .Marland Kitchen... 1 tab once daily/  Orders: TLB-A1C / Hgb A1C (Glycohemoglobin) (83036-A1C)  Addendum - A1C 7.8% - not at goal but no need to change regimen. Plan is to continue present doses and to improved dietary adherence and weight loss by portion size control.  Complete Medication List: 1)  Symbicort 80-4.5 Mcg/act Aero (Budesonide-formoterol fumarate) .... 2 puffs two times a day 2)  Astelin 137 Mcg/spray Soln (Azelastine hcl) .Marland Kitchen.. 1 puff each nostril at bedtime 3)  Allergy Vaccine 1:10 Gh  .... Advance next order from 1:50 to 1:10 per protocol 4)  Proair Hfa 108 (90 Base) Mcg/act Aers (Albuterol sulfate) .... Inhale 2 puffs every 4-6 hours as needed for shortness of breath 5)  Metformin Hcl 1000 Mg Tabs (Metformin hcl) .... Take one tablet twice daily 6)  Levoxyl 75 Mcg Tabs (Levothyroxine sodium) .... Take one tablet once daily 7)  Lantus 100 Unit/ml Soln (Insulin glargine) .... 40 units once daily 8)  Calcium  Carbonate-vitamin D 600-400 Mg-unit Tabs (Calcium carbonate-vitamin d) .... Take 1 tablet by mouth two times a day 9)  Hydrocodone-acetaminophen 5-500 Mg Tabs (Hydrocodone-acetaminophen) .Marland Kitchen.. 1 three times a day 10)  Aciphex 20 Mg Tbec (Rabeprazole sodium) .... Take 1 tablet by mouth once a day 11)  Venlafaxine Hcl 75 Mg Tabs (Venlafaxine hcl) .Marland Kitchen.. 1 by mouth two times a day 12)  Diovan 160 Mg Tabs (Valsartan) .Marland Kitchen.. 1 tab once daily/ 13)  Simvastatin 40 Mg Tabs (Simvastatin) .Marland KitchenMarland KitchenMarland Kitchen  Take 1 tablet by mouth once a day 14)  Eye Vitamins Caps (Multiple vitamins-minerals) .... Take one tablet once daily 15)  Multivitamins Tabs (Multiple vitamin) .... Take 1 tablet by mouth once a day 16)  Cranberry 500 Mg Caps (Cranberry) .... Take 1 capsule by mouth once a day 17)  Excedrin Extra Strength 250-250-65 Mg Tabs (Aspirin-acetaminophen-caffeine) .... Per bottle as needed 18)  Cvs Saline Nasal Spray 0.65 % Soln (Saline) .... 2 puffs every 4 hours as needed 19)  Bd Integra Insulin Syringe 29g X 1/2" 1 Ml Misc (Insulin syringe-needle u-100) .... #30 for use with lantus 20)  Novofine 30g X 8 Mm Misc (Insulin pen needle) .... Use with novolog pen 21)  Accu-chek Aviva Strp (Glucose blood) .... Use 1 test strip every cbg check and check cbg 4 times a day 22)  Sudafed 12 Hour 120 Mg Xr12h-tab (Pseudoephedrine hcl) .... As needed 23)  Zetia 10 Mg Tabs (Ezetimibe) .... Take 1 tablet by mouth once a day 24)  Iron 325 (65 Fe) Mg Tabs (Ferrous sulfate) .Marland Kitchen.. 1 by mouth once daily 25)  Spiriva Handihaler 18 Mcg Caps (Tiotropium bromide monohydrate) .Marland Kitchen.. 1 inhalation daily 26)  Klonopin 1 Mg Tabs (Clonazepam) .Marland Kitchen.. 1-2 by mouth at bedtime as needed for insomnia 27)  Nitrofurantoin Monohyd Macro 100 Mg Caps (Nitrofurantoin monohyd macro) .... Take 1 by mouth at bedtime 28)  Albuterol Sulfate (2.5 Mg/28ml) 0.083% Nebu (Albuterol sulfate) .... Four times a day as needed for sob 29)  Lancets of Pt's Choice  .... Dx:250.00 insulin  dependent test three times a day as needed 30)  Furosemide 40 Mg Tabs (Furosemide) .Marland Kitchen.. 1 by mouth once daily for htn  Other Orders: TLB-TSH (Thyroid Stimulating Hormone) (84443-TSH) T-Ribs Unilateral 2 Views (71100TC)  Patient: [name] Note: All result statuses are Final unless otherwise noted.  Tests: (1) Lipid Panel (LIPID)   Cholesterol               153 mg/dL                   5-409     ATP III Classification            Desirable:  < 200 mg/dL                    Borderline High:  200 - 239 mg/dL               High:  > = 240 mg/dL   Triglycerides        [H]  174.0 mg/dL                 8.1-191.4     Normal:  <150 mg/dL     Borderline High:  782 - 199 mg/dL   HDL                       95.62 mg/dL                 >13.08   VLDL Cholesterol          34.8 mg/dL                  6.5-78.4   LDL Cholesterol           68 mg/dL                    6-96  CHO/HDL Ratio:  CHD Risk  3                    Men          Women     1/2 Average Risk     3.4          3.3     Average Risk          5.0          4.4     2X Average Risk          9.6          7.1     3X Average Risk          15.0          11.0                           Tests: (2) Hepatic/Liver Function Panel (HEPATIC)   Total Bilirubin           0.6 mg/dL                   8.1-1.9   Direct Bilirubin          0.1 mg/dL                   1.4-7.8   Alkaline Phosphatase      87 U/L                      39-117   AST                       18 U/L                      0-37   ALT                       15 U/L                      0-35   Total Protein             7.2 g/dL                    2.9-5.6   Albumin                   4.2 g/dL                    2.1-3.0  Tests: (3) TSH (TSH)   FastTSH                   2.85 uIU/mL                 0.35-5.50  Tests: (4) Hemoglobin A1C (A1C)   Hemoglobin A1C       [H]  7.8 %                       4.6-6.5     Glycemic Control Guidelines for People with Diabetes:      Non Diabetic:  <6%     Goal of Therapy: <7%     Additional Action Suggested:  >8%   Tests: (5) BMP (METABOL)   Sodium                    143  mEq/L                   135-145   Potassium                 3.8 mEq/L                   3.5-5.1   Chloride                  104 mEq/L                   96-112   Carbon Dioxide            30 mEq/L                    19-32   Glucose              [H]  105 mg/dL                   03-47   BUN                       17 mg/dL                    4-25   Creatinine                0.9 mg/dL                   9.5-6.3   Calcium                   9.5 mg/dL                   8.7-56.4   GFR                       70.93 mL/min                >60.00Prescriptions: HYDROCODONE-ACETAMINOPHEN 5-500 MG  TABS (HYDROCODONE-ACETAMINOPHEN) 1 three times a day  #270 x 3   Entered and Authorized by:   Jacques Navy MD   Signed by:   Jacques Navy MD on 12/19/2010   Method used:   Handwritten   RxID:   [id number]    Orders Added: 1)  TLB-Lipid Panel [80061-LIPID] 2)  TLB-Hepatic/Liver Function Pnl [80076-HEPATIC] 3)  TLB-TSH (Thyroid Stimulating Hormone) [84443-TSH] 4)  TLB-A1C / Hgb A1C (Glycohemoglobin) [83036-A1C] 5)  TLB-BMP (Basic Metabolic Panel-BMET) [80048-METABOL] 6)  T-Ribs Unilateral 2 Views [71100TC] 7)  T-1 View CXR [71010TC] 8)  Est. Patient Level IV [16010]

## 2011-01-01 NOTE — Letter (Signed)
Summary: CMN for Diabetes Supplies/[facility]  CMN for Diabetes Supplies/[facility]   Imported By: Sherian Rein 12/30/2009 07:44:36  _____________________________________________________________________  External Attachment:    Type:   Image     Comment:   External Document

## 2011-01-01 NOTE — Progress Notes (Signed)
Summary: nos appt  Phone Note Call from Patient   Caller: [email or web address]  Call For: young Summary of Call: Rsc nos from 12/12 to 12/20. Initial call taken by: Darletta Moll,  November 11, 2010 3:59 PM

## 2011-01-01 NOTE — Miscellaneous (Signed)
Summary: Order to continue with program/[facility]  Order to continue with program/[facility]   Imported By: Sherian Rein 01/10/2010 08:25:24  _____________________________________________________________________  External Attachment:    Type:   Image     Comment:   External Document

## 2011-01-01 NOTE — Assessment & Plan Note (Signed)
Summary: Acute NP office visit - COPD   Copy to:  n/a Primary Provider/Referring Provider:  Illene Regulus, MD  CC:  dyspnea, some wheezing, tightness in chest, dry cough, back aches, sore throat, HA, and increased fatigue/weakness x1.5weeks - denies fc/s.  History of Present Illness: 05/15/09- FOOD allergy, Allergic rhinitis Feels allergy vaccine has helped, with reduced post nasal drip. Notes muscle type pains bilateral back. Went to ER last week for bronchitis and stomach infection. Keeps some cough with scant phlegm but not sneeze.  Food sensitivity- Avoids chocolate and bananas- that has been sufficient. Denies rashes, cramps, diarrhea..   September 02, 2009 --Presents for an acute office visit. Complains of sore throat, HA, prod cough with green mucus x1week . OTC not helping. Denies chest pain, dyspnea, orthopnea, hemoptysis, fever, n/v/d, edema, hea   October 15, 2009- Food allergy, allergic rhinitis, Asthma/ COPD (MR) Acute visit-  1 week head and chest congestion. Sore throat and head hurt. Took abx, most recently doxycycline finished 1-2 weeks ago. No fever. cough productive green just today. Left nasal congestion. Now on nitrofurantoin for UTI. Continues to tell me allergy vaccine seems to help. Some wheeze. She has sliding scale for her insulin.  November 11, 2009- Food Allergy, allergic rhinitis, Asthma/COPD (MR) Breathing is better- she says she no longer gets short of breath.  Reviewed meds.Spiriva helped. She continues to do well with her allergy vaccine.  January 03, 2010 ---Presents for an acute ofifce visit. Complains of dyspnea, some wheezing, tightness in chest, dry cough, back aches, sore throat, HA, increased fatigue/weakness x1.5weeks - denies fc/s Of note she remains on  macrodant for chornic UTI. OTC not working. Denies chest pain,  orthopnea, hemoptysis, fever, n/v/d, edema, headache.   Medications Prior to Update: 1)  Symbicort 80-4.5 Mcg/act Aero  (Budesonide-Formoterol Fumarate) .... 2 Puffs Two Times A Day 2)  Veramyst 27.5 Mcg/spray Susp (Fluticasone Furoate) .... 2 Sprays Daily 3)  Astelin 137 Mcg/spray  Soln (Azelastine Hcl) .Marland Kitchen.. 1 Puff Each Nostril At Bedtime 4)  Allergy Vaccine 1:10 Gh .... Advance Next Order From 1:50 To 1:10 Per Protocol 5)  Proair Hfa 108 (90 Base) Mcg/act Aers (Albuterol Sulfate) .... Inhale 2 Puffs Every 4-6 Hours As Needed For Shortness of Breath 6)  Metformin Hcl 1000 Mg  Tabs (Metformin Hcl) .... Take One Tablet Twice Daily 7)  Furosemide 40 Mg  Tabs (Furosemide) .... Take One Tablet Once Daily 8)  Levoxyl 75 Mcg  Tabs (Levothyroxine Sodium) .... Take One Tablet Once Daily 9)  Lantus 100 Unit/ml  Soln (Insulin Glargine) .... Take 30 Units As Directed 10)  Calcium Carbonate-Vitamin D 600-400 Mg-Unit  Tabs (Calcium Carbonate-Vitamin D) .... Take 1 Tablet By Mouth Two Times A Day 11)  Hydrocodone-Acetaminophen 5-500 Mg  Tabs (Hydrocodone-Acetaminophen) .Marland Kitchen.. 1 Three Times A Day 12)  Aciphex 20 Mg  Tbec (Rabeprazole Sodium) .... Take 1 Tablet By Mouth Once A Day 13)  Amlodipine Besylate 5 Mg  Tabs (Amlodipine Besylate) .... Take 2 Tablet By Mouth Once A Day 14)  Venlafaxine Hcl 37.5 Mg Tabs (Venlafaxine Hcl) .... Take 2 Tablet By Mouth Once A Day 15)  Diovan 160 Mg Tabs (Valsartan) .Marland Kitchen.. 1 Tab Once Daily/ 16)  Simvastatin 40 Mg Tabs (Simvastatin) .... Take 1 Tablet By Mouth Once A Day 17)  Eye Vitamins   Caps (Multiple Vitamins-Minerals) .... Take One Tablet Once Daily 18)  Multivitamins   Tabs (Multiple Vitamin) .... Take 1 Tablet By Mouth Once A Day 19)  Cranberry 500 Mg Caps (Cranberry) .... Take 1 Capsule By Mouth Once A Day 20)  Excedrin Extra Strength 250-250-65 Mg Tabs (Aspirin-Acetaminophen-Caffeine) .... Per Bottle As Needed 21)  [facility] Saline Nasal Spray 0.65 % Soln (Saline) .... 2 Puffs Every 4 Hours As Needed 22)  Bd Integra Insulin Syringe 29g X 1/2" 1 Ml  Misc (Insulin Syringe-Needle U-100) .... #30  For Use With Lantus 23)  Novofine 30g X 8 Mm  Misc (Insulin Pen Needle) .... Use With Novolog Pen 24)  Accu-Chek Aviva  Strp (Glucose Blood) .... Use 1 Test Strip Every Cbg Check and Check Cbg 4 Times A Day 25)  Sudafed 12 Hour 120 Mg Xr12h-Tab (Pseudoephedrine Hcl) .... As Needed 26)  Zetia 10 Mg Tabs (Ezetimibe) .... Take 1 Tablet By Mouth Once A Day 27)  Iron 325 (65 Fe) Mg Tabs (Ferrous Sulfate) .Marland Kitchen.. 1 By Mouth Once Daily 28)  Polyethylene Glycol 3350  Powd (Polyethylene Glycol 3350) .... Put 17gm in A Tall Glass of Juice or Water Daily. 29)  Spiriva Handihaler 18 Mcg Caps (Tiotropium Bromide Monohydrate) .Marland Kitchen.. 1 Inhalation Daily 30)  Klonopin 1 Mg Tabs (Clonazepam) .Marland Kitchen.. 1-2 By Mouth At Bedtime As Needed For Insomnia 31)  [facility] Tru Test Strips .... Test Two Times A Day As Needed Dx: 250.00 32)  Nitrofurantoin Monohyd Macro 100 Mg Caps (Nitrofurantoin Monohyd Macro) .... Take 1 By Mouth At Bedtime 33)  Promethazine Vc/codeine 6.25-5-10 Mg/44ml Syrp (Phenyleph-Promethazine-Cod) .Marland Kitchen.. 1 Teaspoon Four Times A Day As Needed Cough  Current Medications (verified): 1)  Symbicort 80-4.5 Mcg/act Aero (Budesonide-Formoterol Fumarate) .... 2 Puffs Two Times A Day 2)  Veramyst 27.5 Mcg/spray Susp (Fluticasone Furoate) .... 2 Sprays Daily 3)  Astelin 137 Mcg/spray  Soln (Azelastine Hcl) .Marland Kitchen.. 1 Puff Each Nostril At Bedtime 4)  Allergy Vaccine 1:10 Gh .... Advance Next Order From 1:50 To 1:10 Per Protocol 5)  Proair Hfa 108 (90 Base) Mcg/act Aers (Albuterol Sulfate) .... Inhale 2 Puffs Every 4-6 Hours As Needed For Shortness of Breath 6)  Metformin Hcl 1000 Mg  Tabs (Metformin Hcl) .... Take One Tablet Twice Daily 7)  Levoxyl 75 Mcg  Tabs (Levothyroxine Sodium) .... Take One Tablet Once Daily 8)  Lantus 100 Unit/ml  Soln (Insulin Glargine) .... 25units Once Daily 9)  Calcium Carbonate-Vitamin D 600-400 Mg-Unit  Tabs (Calcium Carbonate-Vitamin D) .... Take 1 Tablet By Mouth Two Times A Day 10)   Hydrocodone-Acetaminophen 5-500 Mg  Tabs (Hydrocodone-Acetaminophen) .Marland Kitchen.. 1 Three Times A Day 11)  Aciphex 20 Mg  Tbec (Rabeprazole Sodium) .... Take 1 Tablet By Mouth Once A Day 12)  Amlodipine Besylate 5 Mg  Tabs (Amlodipine Besylate) .... Take 2 Tablet By Mouth Once A Day 13)  Venlafaxine Hcl 37.5 Mg Tabs (Venlafaxine Hcl) .... Take 2 Tablet By Mouth Once A Day 14)  Diovan 160 Mg Tabs (Valsartan) .Marland Kitchen.. 1 Tab Once Daily/ 15)  Simvastatin 40 Mg Tabs (Simvastatin) .... Take 1 Tablet By Mouth Once A Day 16)  Eye Vitamins   Caps (Multiple Vitamins-Minerals) .... Take One Tablet Once Daily 17)  Multivitamins   Tabs (Multiple Vitamin) .... Take 1 Tablet By Mouth Once A Day 18)  Cranberry 500 Mg Caps (Cranberry) .... Take 1 Capsule By Mouth Once A Day 19)  Excedrin Extra Strength 250-250-65 Mg Tabs (Aspirin-Acetaminophen-Caffeine) .... Per Bottle As Needed 20)  [facility] Saline Nasal Spray 0.65 % Soln (Saline) .... 2 Puffs Every 4 Hours As Needed 21)  Bd Integra Insulin Syringe 29g X 1/2" 1  Ml  Misc (Insulin Syringe-Needle U-100) .... #30 For Use With Lantus 22)  Novofine 30g X 8 Mm  Misc (Insulin Pen Needle) .... Use With Novolog Pen 23)  Accu-Chek Aviva  Strp (Glucose Blood) .... Use 1 Test Strip Every Cbg Check and Check Cbg 4 Times A Day 24)  Sudafed 12 Hour 120 Mg Xr12h-Tab (Pseudoephedrine Hcl) .... As Needed 25)  Zetia 10 Mg Tabs (Ezetimibe) .... Take 1 Tablet By Mouth Once A Day 26)  Iron 325 (65 Fe) Mg Tabs (Ferrous Sulfate) .Marland Kitchen.. 1 By Mouth Once Daily 27)  Spiriva Handihaler 18 Mcg Caps (Tiotropium Bromide Monohydrate) .Marland Kitchen.. 1 Inhalation Daily 28)  Klonopin 1 Mg Tabs (Clonazepam) .Marland Kitchen.. 1-2 By Mouth At Bedtime As Needed For Insomnia 29)  Nitrofurantoin Monohyd Macro 100 Mg Caps (Nitrofurantoin Monohyd Macro) .... Take 1 By Mouth At Bedtime  Allergies (verified): 1)  ! Lisinopril 2)  ! Codeine 3)  ! Demerol 4)  ! Asa 5)  ! 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) FOOT SURGERY, HX OF (ICD-V15.89) HYSTERECTOMY, HX OF (ICD-V45.77) HYPERLIPIDEMIA (ICD-272.4) DEPRESSION (ICD-311) HYPERTENSION (ICD-401.9) HYPOTHYROIDISM (ICD-244.9) DIABETES MELLITUS (ICD-250.00) SINUSITIS (ICD-473.9) ALLERGIC RHINITIS (ICD-477.9) ASTHMA (ICD-493.90) POSTNASAL DRIP (ICD-784.91) CONJUNCTIVITIS, MILD (ICD-372.30) ALLERGY, FOOD (ICD-693.1) C O P D (ICD-496) COUGH (ICD-786.2) BRONCHITIS, ACUTE (ICD-466.0) Neg cardiac sress test - 08/21/2008  Past Surgical History: Last updated: 08/12/2009 FOOT SURGERY, HX OF (ICD-V15.89) hammer toe  '04, '04-30-23 HYSTERECTOMY, HX OF (ICD-V45.77) 1981/05/06, menometorrhagia Carpaal tunnnel bilateral....................Marland KitchenDr. Merlyn Lot -> 03/05/2009  Family History: Last updated: 06/18/09 father-died 05/06/2065, lung cancer, HTN, DM mother-died 04/30/2023, HTN, DM, CHF, CAD 2 brother - 1 one agent orange related asthma; allergies 1 sister - breast cancer Neg- colon cancer allergic rhinitis Family History of Liver Cancer:Father (mets) Family History of Prostate Cancer:Father   Social History: Last updated: 2009-06-18 10th grade married - 1965-divorced after 5 yrs; married 10- divorced 2 yrs; married 1974-05-06 3 sons - 05/07/67, 05/06/1974, '85'; 3 daughters - [id number] 07, 2071, May 06, 1978 grandchildren 10; 2 great-grandchildren 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 Stop 9 yrs ago Environment: House with crawl space, central air conditioning, hard wood.  No feather bedding, no mold.  Son smokes.  Pets including dogs, cats, 3, birds. Angioedema with Aspirin. Alcohol Use - no Daily Caffeine Use two cups a day Illicit Drug Use - no Patient gets regular exercise.  Risk Factors: Caffeine Use: 4  (11/03/2007) Exercise: yes (18-Jun-2009)  Risk Factors: Smoking Status: quit (07/12/2008) Passive Smoke Exposure: no (11/03/2007)  Review of Systems      See HPI  Vital Signs:  Patient profile:   64 year old female Height:      63 inches Weight:      241.25 pounds BMI:     42.89 O2 Sat:      96 % on Room air Temp:     98.8 degrees F oral Pulse rate:   84 / minute BP sitting:   134 / 82  (left arm) Cuff size:   large  Vitals Entered By: Boone Master CNA (January 03, 2010 2:27 PM)  O2 Flow:  Room air CC: dyspnea, some wheezing, tightness in chest, dry cough, back aches, sore throat, HA, increased fatigue/weakness x1.5weeks - denies fc/s Is Patient Diabetic? Yes Comments Medications  reviewed with patient Daytime contact number verified with patient. Boone Master CNA  January 03, 2010 2:27 PM    Physical Exam  Additional Exam:  General: A/Ox3; pleasant and cooperative, NAD,  seriously overweight, calm SKIN: red area under bandaid dorsum left hand where she scraped with her fingernail NODES: no lymphadenopathy HEENT: [place]/AT, EOM- WNL, Conjuctivae- clear, PERRLA, TM-WNL, Nose- clear, Throat- clear and wnl, Melampatti III, dentures,   NECK: Supple w/ fair ROM, JVD- none, normal carotid impulses w/o bruits Thyroid-  CHEST: Coarse BS , wheezey cough   HEART: RRR, no m/g/r heard ABDOMEN: Soft  ZOX:WRUE, nl pulses, no edema  NEURO: Grossly intact to observation, head bob tremor      Impression & Recommendations:  Problem # 1:  CHRONIC OBSTRUCTIVE PULMONARY DISEASE, ACUTE EXACERBATION (ICD-491.21)  Doxcycline 100mg  two times a day for 7 days Mucinex DM two times a day as needed cough  follow up Dr. Marchelle Gearing in 4 weeks and as needed  Please contact office for sooner follow up if symptoms do not improve or worsen   Orders: Est. Patient Level IV (45409)  Medications Added to Medication List This Visit: 1)  Lantus 100 Unit/ml Soln (Insulin glargine) .... 25units once  daily  Complete Medication List: 1)  Symbicort 80-4.5 Mcg/act Aero (Budesonide-formoterol fumarate) .... 2 puffs two times a day 2)  Veramyst 27.5 Mcg/spray Susp (Fluticasone furoate) .... 2 sprays daily 3)  Astelin 137 Mcg/spray Soln (Azelastine hcl) .Marland Kitchen.. 1 puff each nostril at bedtime 4)  Allergy Vaccine 1:10 Gh  .... Advance next order from 1:50 to 1:10 per protocol 5)  Proair Hfa 108 (90 Base) Mcg/act Aers (Albuterol sulfate) .... Inhale 2 puffs every 4-6 hours as needed for shortness of breath 6)  Metformin Hcl 1000 Mg Tabs (Metformin hcl) .... Take one tablet twice daily 7)  Levoxyl 75 Mcg Tabs (Levothyroxine sodium) .... Take one tablet once daily 8)  Lantus 100 Unit/ml Soln (Insulin glargine) .... 25units once daily 9)  Calcium Carbonate-vitamin D 600-400 Mg-unit Tabs (Calcium carbonate-vitamin d) .... Take 1 tablet by mouth two times a day 10)  Hydrocodone-acetaminophen 5-500 Mg Tabs (Hydrocodone-acetaminophen) .Marland Kitchen.. 1 three times a day 11)  Aciphex 20 Mg Tbec (Rabeprazole sodium) .... Take 1 tablet by mouth once a day 12)  Amlodipine Besylate 5 Mg Tabs (Amlodipine besylate) .... Take 2 tablet by mouth once a day 13)  Venlafaxine Hcl 37.5 Mg Tabs (Venlafaxine hcl) .... Take 2 tablet by mouth once a day 14)  Diovan 160 Mg Tabs (Valsartan) .Marland Kitchen.. 1 tab once daily/ 15)  Simvastatin 40 Mg Tabs (Simvastatin) .... Take 1 tablet by mouth once a day 16)  Eye Vitamins Caps (Multiple vitamins-minerals) .... Take one tablet once daily 17)  Multivitamins Tabs (Multiple vitamin) .... Take 1 tablet by mouth once a day 18)  Cranberry 500 Mg Caps (Cranberry) .... Take 1 capsule by mouth once a day 19)  Excedrin Extra Strength 250-250-65 Mg Tabs (Aspirin-acetaminophen-caffeine) .... Per bottle as needed 20)  [facility] Saline Nasal Spray 0.65 % Soln (Saline) .... 2 puffs every 4 hours as needed 21)  Bd Integra Insulin Syringe 29g X 1/2" 1 Ml Misc (Insulin syringe-needle u-100) .... #30 for use with lantus 22)   Novofine 30g X 8 Mm Misc (Insulin pen needle) .... Use with novolog pen 23)  Accu-chek Aviva Strp (Glucose blood) .... Use 1 test strip every cbg check and check cbg 4 times a day 24)  Sudafed 12 Hour 120 Mg Xr12h-tab (Pseudoephedrine  hcl) .... As needed 25)  Zetia 10 Mg Tabs (Ezetimibe) .... Take 1 tablet by mouth once a day 26)  Iron 325 (65 Fe) Mg Tabs (Ferrous sulfate) .Marland Kitchen.. 1 by mouth once daily 27)  Spiriva Handihaler 18 Mcg Caps (Tiotropium bromide monohydrate) .Marland Kitchen.. 1 inhalation daily 28)  Klonopin 1 Mg Tabs (Clonazepam) .Marland Kitchen.. 1-2 by mouth at bedtime as needed for insomnia 29)  Nitrofurantoin Monohyd Macro 100 Mg Caps (Nitrofurantoin monohyd macro) .... Take 1 by mouth at bedtime  Other Orders: Nebulizer Tx (16109)  Patient Instructions: 1)  Doxcycline 100mg  two times a day for 7 days 2)  Mucinex DM two times a day as needed cough  3)  follow up Dr. Marchelle Gearing in 4 weeks and as needed  4)  Please contact office for sooner follow up if symptoms do not improve or worsen

## 2011-01-01 NOTE — Assessment & Plan Note (Signed)
Summary: CPX / EVERCARE / LABS AFTER APPT - PT AWARE / cd   Vital Signs:  Patient profile:   65 year old female Height:      63 inches Weight:      231 pounds BMI:     41.07 O2 Sat:      95 % on Room air Temp:     98.3 degrees F oral Pulse rate:   69 / minute BP sitting:   110 / 78  (right arm) Cuff size:   large  Vitals Entered By: Bill Salinas CMA (Apr 01, 2010 1:23 PM)  O2 Flow:  Room air CC: cpx, pt has never had zostavax. she gets her paps with Dr Neil/ ab  Vision Screening:      Vision Comments: pt had eye exam with [facility] about 2 weeks ago. No changes in vision with repeat in 1 year   Primary Care Provider:  Illene Regulus, MD  CC:  cpx and pt has never had zostavax. she gets her paps with Dr Neil/ ab.  History of Present Illness: Patient presents for routine medical follow-up and exam. she is feeling well. She continues on weight loss program and has lost two more pounds since her February visit.  She has continued all her chronic medications and has no complications.   Current Medications (verified): 1)  Symbicort 80-4.5 Mcg/act Aero (Budesonide-Formoterol Fumarate) .... 2 Puffs Two Times A Day 2)  Veramyst 27.5 Mcg/spray Susp (Fluticasone Furoate) .... 2 Sprays Daily 3)  Astelin 137 Mcg/spray  Soln (Azelastine Hcl) .Marland Kitchen.. 1 Puff Each Nostril At Bedtime 4)  Allergy Vaccine 1:10 Gh .... Advance Next Order From 1:50 To 1:10 Per Protocol 5)  Proair Hfa 108 (90 Base) Mcg/act Aers (Albuterol Sulfate) .... Inhale 2 Puffs Every 4-6 Hours As Needed For Shortness of Breath 6)  Metformin Hcl 1000 Mg  Tabs (Metformin Hcl) .... Take One Tablet Twice Daily 7)  Levoxyl 75 Mcg  Tabs (Levothyroxine Sodium) .... Take One Tablet Once Daily 8)  Lantus 100 Unit/ml  Soln (Insulin Glargine) .... 25units Once Daily 9)  Calcium Carbonate-Vitamin D 600-400 Mg-Unit  Tabs (Calcium Carbonate-Vitamin D) .... Take 1 Tablet By Mouth Two Times A Day 10)  Hydrocodone-Acetaminophen 5-500  Mg  Tabs (Hydrocodone-Acetaminophen) .Marland Kitchen.. 1 Three Times A Day 11)  Aciphex 20 Mg  Tbec (Rabeprazole Sodium) .... Take 1 Tablet By Mouth Once A Day 12)  Amlodipine Besylate 5 Mg  Tabs (Amlodipine Besylate) .... Take 2 Tablet By Mouth Once A Day 13)  Venlafaxine Hcl 37.5 Mg Tabs (Venlafaxine Hcl) .... Take 2 Tablet By Mouth Once A Day 14)  Diovan 160 Mg Tabs (Valsartan) .Marland Kitchen.. 1 Tab Once Daily/ 15)  Simvastatin 40 Mg Tabs (Simvastatin) .... Take 1 Tablet By Mouth Once A Day 16)  Eye Vitamins   Caps (Multiple Vitamins-Minerals) .... Take One Tablet Once Daily 17)  Multivitamins   Tabs (Multiple Vitamin) .... Take 1 Tablet By Mouth Once A Day 18)  Cranberry 500 Mg Caps (Cranberry) .... Take 1 Capsule By Mouth Once A Day 19)  Excedrin Extra Strength 250-250-65 Mg Tabs (Aspirin-Acetaminophen-Caffeine) .... Per Bottle As Needed 20)  Cvs Saline Nasal Spray 0.65 % Soln (Saline) .... 2 Puffs Every 4 Hours As Needed 21)  Bd Integra Insulin Syringe 29g X 1/2" 1 Ml  Misc (Insulin Syringe-Needle U-100) .... #30 For Use With Lantus 22)  Novofine 30g X 8 Mm  Misc (Insulin Pen Needle) .... Use With Novolog Pen  23)  Accu-Chek Aviva  Strp (Glucose Blood) .... Use 1 Test Strip Every Cbg Check and Check Cbg 4 Times A Day 24)  Sudafed 12 Hour 120 Mg Xr12h-Tab (Pseudoephedrine Hcl) .... As Needed 25)  Zetia 10 Mg Tabs (Ezetimibe) .... Take 1 Tablet By Mouth Once A Day 26)  Iron 325 (65 Fe) Mg Tabs (Ferrous Sulfate) .Marland Kitchen.. 1 By Mouth Once Daily 27)  Spiriva Handihaler 18 Mcg Caps (Tiotropium Bromide Monohydrate) .Marland Kitchen.. 1 Inhalation Daily 28)  Klonopin 1 Mg Tabs (Clonazepam) .Marland Kitchen.. 1-2 By Mouth At Bedtime As Needed For Insomnia 29)  Nitrofurantoin Monohyd Macro 100 Mg Caps (Nitrofurantoin Monohyd Macro) .... Take 1 By Mouth At Bedtime 30)  Albuterol Sulfate (2.5 Mg/14ml) 0.083% Nebu (Albuterol Sulfate) .... Four Times A Day As Needed For Sob  Allergies (verified): 1)  ! Lisinopril 2)  ! Codeine 3)  ! Demerol 4)  ! Asa 5)   ! 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) FOOT SURGERY, HX OF (ICD-V15.89) HYSTERECTOMY, HX OF (ICD-V45.77) HYPERLIPIDEMIA (ICD-272.4) DEPRESSION (ICD-311) HYPERTENSION (ICD-401.9) HYPOTHYROIDISM (ICD-244.9) DIABETES MELLITUS (ICD-250.00) SINUSITIS (ICD-473.9) ALLERGIC RHINITIS (ICD-477.9) ASTHMA (ICD-493.90) POSTNASAL DRIP (ICD-784.91) CONJUNCTIVITIS, MILD (ICD-372.30) ALLERGY, FOOD (ICD-693.1) C O P D (ICD-496) COUGH (ICD-786.2) BRONCHITIS, ACUTE (ICD-466.0) Neg cardiac sress test - 08/21/2008  Past Surgical History: Last updated: 08/12/2009 FOOT SURGERY, HX OF (ICD-V15.89) hammer toe  '04, '05/04/2023 HYSTERECTOMY, HX OF (ICD-V45.77) 05[birth year]10[birth year]81, menometorrhagia Carpaal tunnnel bilateral....................Marland KitchenDr. Merlyn Lot -> 03/05/2009  Family History: Last updated: 2009/06/22 father-died 05/10/65, lung cancer, HTN, DM mother-died 2023/05/04, HTN, DM, CHF, CAD 2 brother - 1 one agent orange related asthma; allergies 1 sister - breast cancer Neg- colon cancer allergic rhinitis Family History of Liver Cancer:Father (mets) Family History of Prostate Cancer:Father   Social History: Last updated: 2009/06/22 10th grade married - 1965-divorced after 5 yrs; married 41- divorced 2 yrs; married 05/10/1974 3 sons - May 11, 2067, 05-10-1974, '81'; 3 daughters - 06-11-2069June 11, 2071, 05/10/78 grandchildren 10; 2 great-grandchildren 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 Stop 9 yrs ago Environment: House with crawl space, central air conditioning, hard wood.  No feather bedding, no mold.  Son smokes.  Pets including dogs, cats, 3, birds. Angioedema with Aspirin. Alcohol Use - no Daily Caffeine Use two cups a day Illicit Drug Use - no Patient  gets regular exercise.  Risk Factors: Caffeine Use: 4 (11/03/2007) Exercise: yes (06-22-2009)  Risk Factors: Smoking Status: quit (07/12/2008) Passive Smoke Exposure: no (11/03/2007)  Review of Systems       The patient complains of weight loss.  The patient denies anorexia, fever, weight gain, vision loss, decreased hearing, hoarseness, chest pain, syncope, prolonged cough, hemoptysis, abdominal pain, severe indigestion/heartburn, incontinence, muscle weakness, difficulty walking, depression, abnormal bleeding, enlarged lymph nodes, and angioedema.    Physical Exam  General:   obese white female in no distress Head:  Normocephalic and atraumatic without obvious abnormalities. No apparent alopecia or balding. Eyes:  vision grossly intact, pupils equal, pupils round, corneas and lenses clear, and no injection.   Ears:  R ear normal and L ear normal.   Nose:  no external deformity and no external erythema.   Mouth:  edentulous with dentures in place. No buccal lesions. Posterior pharynx clear Neck:  supple, no thyromegaly, and no carotid bruits.  Chest Wall:  No deformities, masses, or tenderness noted. Breasts:  No mass, nodules, thickening, tenderness, bulging, retraction, inflamation, nipple discharge or skin changes noted.   Lungs:  Normal respiratory effort, chest expands symmetrically. Lungs are clear to auscultation, no crackles or wheezes. Heart:  Normal rate and regular rhythm. S1 and S2 normal without gallop, murmur, click, rub or other extra sounds. Abdomen:  soft, non-tender, normal bowel sounds, no guarding, no rigidity, and no hepatomegaly.   Msk:  normal ROM, no joint tenderness, no joint swelling, no joint deformities, and no joint instability.   Pulses:  2+ radial pulse Extremities:  trace to 1+ LE edema bilaterally Neurologic:  alert & oriented X3, cranial nerves II-XII intact, strength normal in all extremities, sensation intact to light touch, gait normal, and DTRs  symmetrical and normal.   Skin:  turgor normal, color normal, and no ulcerations.   Cervical Nodes:  no anterior cervical adenopathy and no posterior cervical adenopathy.   Axillary Nodes:  no R axillary adenopathy and no L axillary adenopathy.   Psych:  Oriented X3, normally interactive, good eye contact, and not anxious appearing.     Impression & Recommendations:  Problem # 1:  ANEMIA, IRON DEFICIENCY (ICD-280.9) She has been taking iron and a well balanced diet. No over symptoms of anemia.  Plan - f/u labs: CBC, Iron  Her updated medication list for this problem includes:    Iron 325 (65 Fe) Mg Tabs (Ferrous sulfate) .Marland Kitchen... 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 Exam is normal  Problem # 3:  ATAXIA (ICD-781.3) Patient is observed to have a steady gait. she does not require any assist or device.  Problem # 4:  HYPERLIPIDEMIA (ICD-272.4) Due for routine lab with recommendations to follow.  Her updated medication list for this problem includes:    Simvastatin 40 Mg Tabs (Simvastatin) .Marland Kitchen... Take 1 tablet by mouth once a day    Zetia 10 Mg Tabs (Ezetimibe) .Marland Kitchen... Take 1 tablet by mouth once a day  Orders: TLB-Lipid Panel (80061-LIPID) TLB-Hepatic/Liver Function Pnl (80076-HEPATIC)  Addendum- LDL 75 - excellent control on present medications  Problem # 5:  HYPERTENSION (ICD-401.9)  Her updated medication list for this problem includes:    Amlodipine Besylate 5 Mg Tabs (Amlodipine besylate) .Marland Kitchen... Take 1 tablet by mouth once a day    Diovan 160 Mg Tabs (Valsartan) .Marland Kitchen... 1 tab once daily/  BP today: 110/78 Prior BP: 118/64 (01/09/2010)  Prior 10 Yr Risk Heart  Disease: 11 % (11/11/2009)  Very good control. Plan - continue present medications  Problem # 6:  HYPOTHYROIDISM (ICD-244.9)  Her updated medication list for this problem includes:    Levoxyl 75 Mcg Tabs (Levothyroxine sodium) .Marland Kitchen... Take one tablet once daily  Labs Reviewed: TSH: 4.27 (04/17/2009)     Last TSH revealed good control on present dose of medication. Will continue the same.  Problem # 7:  DIABETES MELLITUS (ICD-250.00) Due for routine lab follow-up.  Her updated medication list for this problem includes:    Metformin Hcl 1000 Mg Tabs (Metformin hcl) .Marland Kitchen... Take one tablet twice daily    Lantus 100 Unit/ml Soln (Insulin glargine) .Marland Kitchen... 25units once daily    Diovan 160 Mg  Tabs (Valsartan) .Marland Kitchen... 1 tab once daily/  Orders: TLB-A1C / Hgb A1C (Glycohemoglobin) (83036-A1C)  Addendum - A1C 7.2% - GOOD CONTROL  Problem # 8:  ANXIETY DEPRESSION (ICD-300.4) Patient is doing well. She is taking good care of herself which helps with her anxiety.   Problem # 9:  Preventive Health Care (ICD-V70.0) Except for obesity she is doing well. She has been working hard to loose weight and has done very well! She is current with mammography. Last colonoscopy in '09. She is current with tetnus. She is a candidate for pneumonia vaccine.   In summary - a patient with multiple medical problems who seems to be stable. She is doing a great job on [employer] management with steady weight loss. She will return for routine follow-up in 6 months.   Complete Medication List: 1)  Symbicort 80-4.5 Mcg/act Aero (Budesonide-formoterol fumarate) .... 2 puffs two times a day 2)  Veramyst 27.5 Mcg/spray Susp (Fluticasone furoate) .... 2 sprays daily 3)  Astelin 137 Mcg/spray Soln (Azelastine hcl) .Marland Kitchen.. 1 puff each nostril at bedtime 4)  Allergy Vaccine 1:10 Gh  .... Advance next order from 1:50 to 1:10 per protocol 5)  Proair Hfa 108 (90 Base) Mcg/act Aers (Albuterol sulfate) .... Inhale 2 puffs every 4-6 hours  as needed for shortness of breath 6)  Metformin Hcl 1000 Mg Tabs (Metformin hcl) .... Take one tablet twice daily 7)  Levoxyl 75 Mcg Tabs (Levothyroxine sodium) .... Take one tablet once daily 8)  Lantus 100 Unit/ml Soln (Insulin glargine) .... 25units once daily 9)  Calcium Carbonate-vitamin D 600-400 Mg-unit Tabs (Calcium carbonate-vitamin d) .... Take 1 tablet by mouth two times a day 10)  Hydrocodone-acetaminophen 5-500 Mg Tabs (Hydrocodone-acetaminophen) .Marland Kitchen.. 1 three times a day 11)  Aciphex 20 Mg Tbec (Rabeprazole sodium) .... Take 1 tablet by mouth once a day 12)  Amlodipine Besylate 5 Mg Tabs (Amlodipine besylate) .... Take 1 tablet by mouth once a day 13)  Venlafaxine Hcl 37.5 Mg Tabs (Venlafaxine hcl) .... Take 2 tablet by mouth once a day 14)  Diovan 160 Mg Tabs (Valsartan) .Marland Kitchen.. 1 tab once daily/ 15)  Simvastatin 40 Mg Tabs (Simvastatin) .... Take 1 tablet by mouth once a day 16)  Eye Vitamins Caps (Multiple vitamins-minerals) .... Take one tablet once daily 17)  Multivitamins Tabs (Multiple vitamin) .... Take 1 tablet by mouth once a day 18)  Cranberry 500 Mg Caps (Cranberry) .... Take 1 capsule by mouth once a day 19)  Excedrin Extra Strength 250-250-65 Mg Tabs (Aspirin-acetaminophen-caffeine) .... Per bottle as needed 20)  Cvs Saline Nasal Spray 0.65 % Soln (Saline) .... 2 puffs every 4 hours as needed 21)  Bd Integra Insulin Syringe 29g X 1/2" 1 Ml Misc (Insulin syringe-needle u-100) .... #30 for use with lantus 22)  Novofine 30g X 8 Mm Misc (Insulin pen needle) .... Use with novolog pen 23)  Accu-chek Aviva Strp (Glucose blood) .... Use 1 test strip every cbg check and check cbg 4 times a day 24)  Sudafed 12 Hour 120 Mg Xr12h-tab (Pseudoephedrine hcl) .... As needed 25)  Zetia 10 Mg Tabs (Ezetimibe) .... Take 1 tablet by mouth once a day 26)  Iron 325 (65 Fe) Mg Tabs (Ferrous sulfate) .Marland Kitchen.. 1 by mouth once daily 27)  Spiriva Handihaler 18 Mcg Caps (Tiotropium bromide monohydrate)  .Marland Kitchen.. 1 inhalation daily 28)  Klonopin 1 Mg Tabs (Clonazepam) .Marland Kitchen.. 1-2 by mouth at bedtime as needed for insomnia 29)  Nitrofurantoin Monohyd Macro 100 Mg  Caps (Nitrofurantoin monohyd macro) .... Take 1 by mouth at bedtime 30)  Albuterol Sulfate (2.5 Mg/11ml) 0.083% Nebu (Albuterol sulfate) .... Four times a day as needed for sob  Patient: [name] Note: All result statuses are Final unless otherwise noted.  Tests: (1) IBC Panel (IBC)   Iron                 [L]  39 ug/dL                    40-981   Transferrin               268.6 mg/dL                 191.4-782.9   Iron Saturation      [L]  10.4 %                      20.0-50.0  Tests: (2) B12 + Folate Panel (B12/FOL)   Vitamin B12          [L]  165 pg/mL                   211-911   Folate                    13.8 ng/mL     Deficient  0.4 - 3.4 ng/mL     Indeterminate  3.4 - 5.4 ng/mL     Normal  >5.4 ng/mL  Tests: (3) Lipid Panel (LIPID)   Cholesterol               145 mg/dL                   5-621     ATP III Classification            Desirable:  < 200 mg/dL                    Borderline High:  200 - 239 mg/dL               High:  > = 240 mg/dL   Triglycerides             144.0 mg/dL                 3.0-865.7     Normal:  <150 mg/dL     Borderline High:  846 - 199 mg/dL   HDL                       96.29 mg/dL                 >52.84   VLDL Cholesterol          28.8 mg/dL                  1.3-24.4   LDL Cholesterol           75 mg/dL                    0-10  CHO/HDL Ratio:  CHD Risk                             4                    Men  Women     1/2 Average Risk     3.4          3.3     Average Risk          5.0          4.4     2X Average Risk          9.6          7.1     3X Average Risk          15.0          11.0                           Tests: (4) Hepatic/Liver Function Panel (HEPATIC)   Total Bilirubin           0.5 mg/dL                   1.9-1.4   Direct Bilirubin          0.1 mg/dL                    7.8-2.9   Alkaline Phosphatase      77 U/L                      39-117   AST                       22 U/L                      0-37   ALT                       17 U/L                      0-35   Total Protein             6.6 g/dL                    5.6-2.1   Albumin                   3.8 g/dL                    3.0-8.6  Tests: (5) Hemoglobin A1C (A1C)   Hemoglobin A1C       [H]  7.2 %                       4.6-6.5Prescriptions: KLONOPIN 1 MG TABS (CLONAZEPAM) 1-2 by mouth at bedtime as needed for insomnia  #180 x 3   Entered and Authorized by:   Jacques Navy MD   Signed by:   Jacques Navy MD on 04/01/2010   Method used:   Print then Give to Patient   RxID:   [id number] HYDROCODONE-ACETAMINOPHEN 5-500 MG  TABS (HYDROCODONE-ACETAMINOPHEN) 1 three times a day  #270 x 0   Entered and Authorized by:   Jacques Navy MD   Signed by:   Jacques Navy MD on 04/01/2010   Method used:   Print then Give to Patient   RxID:   [id number] SPIRIVA HANDIHALER 18 MCG CAPS (TIOTROPIUM BROMIDE MONOHYDRATE) 1 inhalation daily  #90 x 3   Entered and Authorized by:   Rosalyn Gess  Norins MD   Signed by:   Jacques Navy MD on 04/01/2010   Method used:   Electronically to        [facility]. [address] [phone]* (retail)       [address]       [place], [place]  [address]       Ph: [phone] or [phone]       Fax: [phone]   RxID:   [id number] IRON 325 (65 FE) MG TABS (FERROUS SULFATE) 1 by mouth once daily  #90 x 3   Entered and Authorized by:   Jacques Navy MD   Signed by:   Jacques Navy MD on 04/01/2010   Method used:   Electronically to        [facility] [phone]* (retail)       [address]       [place], [place]  [address]       Ph: [phone] or [phone]       Fax: [phone]   RxID:   [id number] ZETIA 10 MG TABS (EZETIMIBE) Take 1 tablet by mouth once a day  #90 x 3   Entered and Authorized  by:   Jacques Navy MD   Signed by:   Jacques Navy MD on 04/01/2010   Method used:   Electronically to        [facility] [address] [phone]* (retail)       [address]       [place], [place]  [address]       Ph: [phone] or [phone]       Fax: [phone]   RxID:   [id number] NOVOFINE 30G X 8 MM  MISC (INSULIN PEN NEEDLE) use with novolog pen  #90 x 3   Entered and Authorized by:   Jacques Navy MD   Signed by:   Jacques Navy MD on 04/01/2010   Method used:   Electronically to        [facility] [address] [phone]* (retail)       [address]       [place], [place]  [address]       Ph: [phone] or [phone]       Fax: [phone]   RxID:   [id number] BD INTEGRA INSULIN SYRINGE 29G X 1/2" 1 ML  MISC (INSULIN SYRINGE-NEEDLE U-100) #30 for use with lantus  #90 x 3   Entered and Authorized by:   Jacques Navy MD   Signed by:   Jacques Navy MD on 04/01/2010   Method used:   Electronically to        [facility] [address] [phone]* (retail)       [address]       [place], [place]  [address]       Ph: [phone] or [phone]       Fax: [phone]   RxID:   [id number] SIMVASTATIN 40 MG TABS (SIMVASTATIN) Take 1 tablet by mouth once a day  #90 x 3   Entered and Authorized by:   Jacques Navy MD   Signed by:   Jacques Navy MD on 04/01/2010   Method  used:   Electronically to        [facility]. [address] [phone]* (retail)       [address]       [place], [place]  [address]       Ph: [phone] or [phone]       Fax: [phone]   RxID:   [id number] DIOVAN 160 MG TABS (VALSARTAN) 1 tab once daily/  #90 x 3   Entered and Authorized by:   Jacques Navy MD   Signed by:   Jacques Navy MD on 04/01/2010   Method used:   Electronically to        [facility] [address] [phone]* (retail)       [address]       [place], [place]  [address]       Ph: [phone] or [phone]       Fax: [phone]   RxID:   [id number] VENLAFAXINE HCL 37.5 MG TABS (VENLAFAXINE HCL) Take 2 tablet by mouth once a day  #180.0 Each x 3   Entered and Authorized by:   Jacques Navy MD   Signed by:   Jacques Navy MD on 04/01/2010   Method used:   Electronically to        [facility] [address] [phone]* (retail)       [address]       [place], [place]  [address]       Ph: [phone] or [phone]       Fax: [phone]   RxID:   [id number] AMLODIPINE BESYLATE 5 MG  TABS (AMLODIPINE BESYLATE) Take 1 tablet by mouth once a day  #90 x 3   Entered and Authorized by:   Jacques Navy MD   Signed by:   Jacques Navy MD on 04/01/2010   Method used:   Electronically to        [facility] [address] [phone]* (retail)       [address]       [place], [place]  [address]       Ph: [phone] or [phone]       Fax: [phone]   RxID:   [id number] ACIPHEX 20 MG  TBEC (RABEPRAZOLE SODIUM) Take 1 tablet by mouth once a day  #90 x 3   Entered and Authorized by:   Jacques Navy MD   Signed by:   Jacques Navy MD on 04/01/2010   Method used:   Electronically to        [facility] [address] [phone]* (retail)       [address]       [place], [place]  [address]       Ph: [phone] or [phone]       Fax: [phone]   RxID:   [id number] LANTUS 100 UNIT/ML  SOLN (INSULIN GLARGINE) 25units once daily  #6 x 3   Entered and Authorized by:   Jacques Navy MD   Signed by:   Jacques Navy MD on 04/01/2010   Method used:   Electronically to        [facility] [address] [phone]* (retail)  [address]       [place], [place]  [address]       Ph: [phone] or [phone]       Fax: [phone]   RxID:   [id number] LEVOXYL 75 MCG  TABS (LEVOTHYROXINE SODIUM) Take one tablet once daily  #90 x 3    Entered and Authorized by:   Jacques Navy MD   Signed by:   Jacques Navy MD on 04/01/2010   Method used:   Electronically to        [facility] [address] [phone]* (retail)       [address]       [place], [place]  [address]       Ph: [phone] or [phone]       Fax: [phone]   RxID:   [id number] METFORMIN HCL 1000 MG  TABS (METFORMIN HCL) Take one tablet twice daily  #180 x 3   Entered and Authorized by:   Jacques Navy MD   Signed by:   Jacques Navy MD on 04/01/2010   Method used:   Electronically to        [facility] [phone]* (retail)       [address]       [place], [place]  [address]       Ph: [phone] or [phone]       Fax: [phone]   RxID:   [id number] PROAIR HFA 108 (90 BASE) MCG/ACT AERS (ALBUTEROL SULFATE) inhale 2 puffs every 4-6 hours as needed for shortness of breath  #1 x 3   Entered and Authorized by:   Jacques Navy MD   Signed by:   Jacques Navy MD on 04/01/2010   Method used:   Electronically to        [facility] [phone]* (retail)       [address]       [place], [place]  [address]       Ph: [phone] or [phone]       Fax: [phone]   RxID:   [id number] ASTELIN 137 MCG/SPRAY  SOLN (AZELASTINE HCL) 1 puff each nostril at bedtime  #3 x 3   Entered and Authorized by:   Jacques Navy MD   Signed by:   Jacques Navy MD on 04/01/2010   Method used:   Electronically to        [facility] [address] [phone]* (retail)       [address]       [place], [place]  [address]       Ph: [phone] or [phone]       Fax: [phone]   RxID:   [id number] SYMBICORT 80-4.5 MCG/ACT AERO (BUDESONIDE-FORMOTEROL FUMARATE) 2 puffs two times a day  #3 x 3   Entered and Authorized by:   Jacques Navy MD   Signed by:   Jacques Navy MD on 04/01/2010   Method used:   Electronically to        [facility]  [address] [phone]* (retail)       [address]       [place], [place]  [address]       Ph: [phone] or [phone]       Fax: [phone]   RxID:   [id number]

## 2011-01-01 NOTE — Progress Notes (Signed)
Summary: Lantus rx  Phone Note From Pharmacy   Summary of Call: The office received notice from the patient pharmacy that she is out of Lantus 10 days early and needs new prescription with increased dosage and quantity. I called the patient to verify and she states that she takes 40 units once daily and an addition 10 units when she eats sweets.  Please advise. Initial call taken by: Lucious Groves CMA,  July 21, 2010 11:23 AM  Follow-up for Phone Call        OK for lantus refill. 4 solostar pens or 3 bottles. She should take 40 units once daily, NOT EAT SWEETS and not use lantus as needed when she does eat sweets.  Follow-up by: Jacques Navy MD,  July 21, 2010 12:58 PM  Additional Follow-up for Phone Call Additional follow up Details #1::        pt informed  Additional Follow-up by: Lanier Prude, [facility]),  July 21, 2010 2:32 PM    Prescriptions: LANTUS 100 UNIT/ML  SOLN (INSULIN GLARGINE) 25units once daily  #3 x 3   Entered by:   Lanier Prude, [facility])   Authorized by:   Jacques Navy MD   Signed by:   Lanier Prude, [facility]) on 07/21/2010   Method used:   Electronically to        [facility] [address] [phone]* (retail)       [address]       [place], [place]  [address]       Ph: [phone] or [phone]       Fax: [phone]   RxID:   [id number]

## 2011-01-01 NOTE — Progress Notes (Signed)
Summary: Call Report  Phone Note Other Incoming   Caller: [facility] Summary of Call: [facility] Triage Call Report Triage Record Num: [id number] Operator: Freddie Breech Patient Name: [name] Call Date & Time: 09/27/2010 11:59:31AM Patient Phone: [phone] PCP: Illene Regulus Patient Gender: Female PCP Fax : [phone] Patient DOB: 03/02/1946 Practice Name: [facility] Reason for Call: Pt has been out of Furosemide x 2 wks. Pharmacy advises rx has been denied. RN offerd to call in 3 pills to last until 09/29/10 but pt declines. She will call the ofc on 09/29/10. Protocol(s) Used: Medication Question Calls, No Triage (Adults) Recommended Outcome per Protocol: Provide Information or Advice Only Reason for Outcome: Caller has medication question only and triager answers question Care Advice:  ~ 10/ Initial call taken by: Margaret Pyle, CMA,  September 29, 2010 9:38 AM  Follow-up for Phone Call        see 10/28 phone note.  Follow-up by: Jacques Navy MD,  September 29, 2010 1:08 PM

## 2011-01-01 NOTE — Progress Notes (Signed)
Summary: rx  ATC- Line busy    Phone Note Call from Patient Call back at Home Phone [phone]   Caller: Patient Call For: young Reason for Call: Acute Illness, Talk to Nurse Complaint: Cough/Sore throat Summary of Call: sinus infection, coughing up green Plegm, fever 102, sore throat, drainage, chest congestion.  Can she get rx for doxycycline? [facility]. Initial call taken by: Eugene Gavia,  February 21, 2010 9:06 AM  Follow-up for Phone Call        please advise.  thank you.  allergies:  latex, lisinopril, codeine,demerol, lidocaine, asa  Arman Filter LPN  February 21, 2010 9:23 AM   Additional Follow-up for Phone Call Additional follow up Details #1::        per CY  offer Doxycycline 100mg   take 2 today and then 1 daily until gone # 8 x 0 refills.   ATC pt at home # to inform her of CY's recs.  Line busy.  Will try back later.  Aundra Millet Reynolds LPN  February 21, 2010 10:19 AM   pt advised. Carron Curie CMA  February 21, 2010 4:58 PM     Additional Follow-up for Phone Call Additional follow up Details #2::    atc pt at home #.  Line busy.  I looked through pt's chart and found a previous phone note with a different #  [phone])  LMOM at that # for pt to call me back.  Aundra Millet Reynolds LPN  February 21, 2010 1:10 PM   New/Updated Medications: DOXYCYCLINE HYCLATE 100 MG TABS (DOXYCYCLINE HYCLATE) take 2 tabs by mouth today and then 1 daily until gone. Prescriptions: DOXYCYCLINE HYCLATE 100 MG TABS (DOXYCYCLINE HYCLATE) take 2 tabs by mouth today and then 1 daily until gone.  #8 x 0   Entered by:   Arman Filter LPN   Authorized by:   Waymon Budge MD   Signed by:   Arman Filter LPN on 96/29/5284   Method used:   Electronically to        [facility]. [address] [phone]* (retail)       [address]       [place], [place]  [address]       Ph: [phone] or [phone]       Fax: [phone]   RxID:   [id number]

## 2011-01-01 NOTE — Progress Notes (Signed)
  Phone Note Refill Request Message from:  Fax from Pharmacy on June 30, 2010 11:41 AM  Refills Requested: Medication #1:  HYDROCODONE-ACETAMINOPHEN 5-500 MG  TABS 1 three times a day Last ov 04/01/2010  Initial call taken by: Ami Bullins CMA,  June 30, 2010 11:41 AM  Follow-up for Phone Call        ok x 3 Follow-up by: Jacques Navy MD,  June 30, 2010 2:36 PM    Prescriptions: HYDROCODONE-ACETAMINOPHEN 5-500 MG  TABS (HYDROCODONE-ACETAMINOPHEN) 1 three times a day  #270 x 3   Entered by:   Ami Bullins CMA   Authorized by:   Jacques Navy MD   Signed by:   Bill Salinas CMA on 06/30/2010   Method used:   Telephoned to ...       [facility] [address] [phone]* (retail)       [address]       [place], [place]  [address]       Ph: [phone] or [phone]       Fax: [phone]   RxID:   [id number]

## 2011-01-01 NOTE — Progress Notes (Signed)
Summary: rx  Phone Note Call from Patient Call back at Home Phone [phone]   Caller: Patient Call For: young Reason for Call: Refill Medication, Talk to Nurse Summary of Call: Sinus infection, ear hurts, drainage, some plegm, no color, some fever, 102.  Would like some Doxycycline called in. [facility]. Initial call taken by: Eugene Gavia,  March 24, 2010 8:27 AM  Follow-up for Phone Call        Pt c/o head congestion, right ear pain, productive cough with yellow phlegm, fever of 102 x 1 week. Pt has had rx for augmentin in feb. 2011, and doxycycline on 02/21/10. Pt states she gets better after taking abx, but symptoms return after a few weeks. Pt request rx for doxycycline.  Please advise. Carron Curie CMA  March 24, 2010 9:26 AM allergies:1)  ! Lisinopril 2)  ! Codeine 3)  ! Demerol 4)  ! Asa 5)  ! * Latex 6)  * Lidocaine Patch  Additional Follow-up for Phone Call Additional follow up Details #1::        OK doxycycline 100 mg, # 10 2 today then one daily. may ref x 1. Additional Follow-up by: Waymon Budge MD,  March 24, 2010 9:51 AM    Additional Follow-up for Phone Call Additional follow up Details #2::    pt aware rx sent to the pharmacy Follow-up by: Philipp Deputy CMA,  March 24, 2010 10:00 AM  New/Updated Medications: DOXYCYCLINE HYCLATE 100 MG CAPS (DOXYCYCLINE HYCLATE) 2 tabs today then 1 daily till gone Prescriptions: DOXYCYCLINE HYCLATE 100 MG CAPS (DOXYCYCLINE HYCLATE) 2 tabs today then 1 daily till gone  #10 x 1   Entered by:   Philipp Deputy CMA   Authorized by:   Waymon Budge MD   Signed by:   Philipp Deputy CMA on 03/24/2010   Method used:   Electronically to        [facility]. [address] [phone]* (retail)       [address]       [place], [place]  [address]       Ph: [phone] or [phone]       Fax: [phone]   RxID:   [id number]

## 2011-01-01 NOTE — Letter (Signed)
Summary: Pre Visit Letter Revised  [facility]  [address] [place], [place] [address]   Phone: [phone]  Fax: [phone]        09/19/2010 MRN: [id number] [facility] [address] [place], [place]  [address]             Procedure Date:  11/03/2010   Welcome to the Gastroenterology Division at [facility].    You are scheduled to see a nurse for your pre-procedure visit on 10/20/2010 at 8:00AM on the 3rd floor at [facility], [address]  We ask that you try to arrive at our office 15 minutes prior to your appointment time to allow for check-in.  Please take a minute to review the attached form.  If you answer "Yes" to one or more of the questions on the first page, we ask that you call the person listed at your earliest opportunity.  If you answer "No" to all of the questions, please complete the rest of the form and bring it to your appointment.    Your nurse visit will consist of discussing your medical and surgical history, your immediate family medical history, and your medications.   If you are unable to list all of your medications on the form, please bring the medication bottles to your appointment and we will list them.  We will need to be aware of both prescribed and over the counter drugs.  We will need to know exact dosage information as well.    Please be prepared to read and sign documents such as consent forms, a financial agreement, and acknowledgement forms.  If necessary, and with your consent, a friend or relative is welcome to sit-in on the nurse visit with you.  Please bring your insurance card so that we may make a copy of it.  If your insurance requires a referral to see a specialist, please bring your referral form from your primary care physician.  No co-pay is required for this nurse visit.     If you cannot keep your appointment, please call [phone] to cancel or reschedule prior to your appointment date.  This  allows [place] the opportunity to schedule an appointment for another patient in need of care.    Thank you for choosing [facility] for your medical needs.  We appreciate the opportunity to care for you.  Please visit [place] at our website  to learn more about our practice.  Sincerely, The Gastroenterology Division

## 2011-01-01 NOTE — Progress Notes (Signed)
  Phone Note Call from Patient   Summary of Call: Patient is requesting refill of furosemide, I don't see in EMR.  Initial call taken by: Lamar Sprinkles, CMA,  September 26, 2010 4:21 PM  Follow-up for Phone Call        reveiwed last two office notes and don't see where lasix was ordered. However, her last BP was 142/80's.  OK for furosemide 40mg  qAM, #90. Needs nurse visit for BP check after 2 weeks of furosemide. Follow-up by: Jacques Navy MD,  September 27, 2010 11:58 AM  Additional Follow-up for Phone Call Additional follow up Details #1::        left message for pt to callback office Additional Follow-up by: Brenton Grills CMA Duncan Dull),  September 30, 2010 10:22 AM    Additional Follow-up for Phone Call Additional follow up Details #2::    pt informed to callback to schedule Nurse Visit after 2 weeks on Furosemide, rx sent to pharmacy Follow-up by: Brenton Grills CMA Duncan Dull),  September 30, 2010 11:25 AM  New/Updated Medications: FUROSEMIDE 40 MG TABS (FUROSEMIDE) 1 by mouth once daily for HTN Prescriptions: FUROSEMIDE 40 MG TABS (FUROSEMIDE) 1 by mouth once daily for HTN  #90 x 0   Entered by:   Brenton Grills CMA (AAMA)   Authorized by:   Jacques Navy MD   Signed by:   Brenton Grills CMA (AAMA) on 09/30/2010   Method used:   Electronically to        [facility] [address] [phone]* (retail)       [address]       [place], [place]  [address]       Ph: [phone] or [phone]       Fax: [phone]   RxID:   [id number]

## 2011-01-01 NOTE — Progress Notes (Signed)
Summary: CBC Orders  Phone Note Outgoing Call Call back at [facility] Phone [phone]   Call placed by: Merri Ray CMA Duncan Dull),  November 06, 2010 2:39 PM Summary of Call: Called pt to inform that she needs a CBC drawn. I put orders in IDX  Initial call taken by: Merri Ray CMA Duncan Dull),  November 06, 2010 2:39 PM  Follow-up for Phone Call        2nd call to remind pt to have labs drawn. No answer L/M for pt Follow-up by: Merri Ray CMA (AAMA),  November 07, 2010 11:00 AM

## 2011-01-01 NOTE — Progress Notes (Signed)
    Preventive Care Screening  Mammogram:    Date:  07/17/2010    Results:  normal

## 2011-01-01 NOTE — Progress Notes (Signed)
Summary: [facility] for records received from [facility]. Request forwarded to [employer]. Dena Chavis  December 03, 2009 4:12 PM

## 2011-01-01 NOTE — Letter (Signed)
Summary: Diabetic Instructions  [facility]  [address] [place], [place] [address]   Phone: [phone]  Fax: [phone]    [name] 1946/09/25 MRN: [id number]   _x  _   ORAL DIABETIC MEDICATION INSTRUCTIONS  The day before your procedure:   Take your diabetic pill as you do normally  The day of your procedure:   Do not take your diabetic pill    We will check your blood sugar levels during the admission process and again in Recovery before discharging you home  ________________________________________________________________________  _x  _   INSULIN (LONG ACTING) MEDICATION INSTRUCTIONS (Lantus, NPH, 70/30, Humulin, Novolin-N)   The day before your procedure:   Take  your regular evening dose    The day of your procedure:   Do not take your morning dose

## 2011-01-01 NOTE — Progress Notes (Signed)
Summary: prescript for cool mist vaporizor  Phone Note Call from Patient Call back at [phone]   Caller: Patient Call For: ramaswamy Summary of Call: pt need prescript for cool mist vaporizer [facility] to [facility] Initial call taken by: Rickard Patience,  December 24, 2009 9:50 AM  Follow-up for Phone Call        please advise if this is ok or not to send order.  thank you.  Aundra Millet Reynolds LPN  December 24, 2009 10:13 AM   Additional Follow-up for Phone Call Additional follow up Details #1::        ok Additional Follow-up by: Kalman Shan MD,  December 24, 2009 12:30 PM    Additional Follow-up for Phone Call Additional follow up Details #2::    order sent to [facility]. Arman Filter LPN  December 24, 2009 12:42 PM

## 2011-01-01 NOTE — Letter (Signed)
Summary: [facility]   Imported By: Sherian Rein 10/08/2010 10:41:00  _____________________________________________________________________  External Attachment:    Type:   Image     Comment:   External Document

## 2011-01-01 NOTE — Letter (Signed)
Summary: Patient Notice- Polyp Results  [facility]  [address] [place], [place] [address]   Phone: [phone]  Fax: [phone]        November 06, 2010 MRN: [id number]    [facility] [address] [place], [place]  [address]    Dear Ms. Suess,  I am pleased to inform you that the colon polyp(s) removed during your recent colonoscopy was (were) found to be benign (no cancer detected) upon pathologic examination.  I recommend you have a repeat colonoscopy examination in _3 years to look for recurrent polyps, as having colon polyps increases your risk for having recurrent polyps or even colon cancer in the future.  Should you develop new or worsening symptoms of abdominal pain, bowel habit changes or bleeding from the rectum or bowels, please schedule an evaluation with either your primary care physician or with me.  Additional information/recommendations:  __ No further action with gastroenterology is needed at this time. Please      follow-up with your primary care physician for your other healthcare      needs.  __ Please call [phone] to schedule a return visit to review your      situation.  __ Please keep your follow-up visit as already scheduled.  _x_ Continue treatment plan as outlined the day of your exam.  Please call us if you are having persistent problems or have questions about your condition that have not been fully answered at this time.  Sincerely,  Louis Meckel MD  This letter has been electronically signed by your physician.  Appended Document: Patient Notice- Polyp Results letter mailed

## 2011-01-01 NOTE — Progress Notes (Signed)
Summary: lab results  Phone Note Call from Patient Call back at Home Phone [phone]   Call For: Dr Arlyce Dice Reason for Call: Lab or Test Results Initial call taken by: Leanor Kail [facility],  November 20, 2010 1:09 PM  Follow-up for Phone Call        Spoke with patient and let her know her CBC results were normal. Selinda Michaels RN  November 20, 2010 1:19 PM

## 2011-01-01 NOTE — Procedures (Signed)
Summary: Consent & Financial Forms/[facility]  Consent & Financial Forms/[facility]   Imported By: Sherian Rein 10/22/2010 08:36:05  _____________________________________________________________________  External Attachment:    Type:   Image     Comment:   External Document

## 2011-01-01 NOTE — Progress Notes (Signed)
Summary: Hormone replacement?  Phone Note Call from Patient Call back at [facility] Phone [phone]   Summary of Call: Patient is requesting to know if MD prescribes meds for hormone replacement?  Initial call taken by: Lamar Sprinkles, CMA,  September 12, 2010 12:24 PM  Follow-up for Phone Call        I do prescribe HRT for the appropriate patients. Follow-up by: Jacques Navy MD,  September 12, 2010 1:25 PM  Additional Follow-up for Phone Call Additional follow up Details #1::        Left detailed vm for patient per her request Additional Follow-up by: Lamar Sprinkles, CMA,  September 12, 2010 3:39 PM

## 2011-01-01 NOTE — Progress Notes (Signed)
Summary: returning call  Phone Note Call from Patient Call back at [phone]   Caller: Patient Call For: young Summary of Call: Returning Katie's phone call. Initial call taken by: Darletta Moll,  January 30, 2010 8:42 AM  Follow-up for Phone Call        Spoke with pt and notified of cxr results per [facility] append. Follow-up by: Vernie Murders,  January 30, 2010 9:36 AM

## 2011-01-01 NOTE — Progress Notes (Signed)
Summary: Questions  Phone Note Call from Patient Call back at Home Phone [phone]   Caller: Patient Call For: Dr. Arlyce Dice Reason for Call: Talk to Nurse Summary of Call: Pt had pre-visit today and now she has more questions, would like to speak with RObin Initial call taken by: [place] Johnson,  November 03, 2010 1:08 PM  Follow-up for Phone Call        Phone line busy at this time. Will try again later. Follow-up by: Sherren Kerns RN,  November 03, 2010 3:24 PM  Additional Follow-up for Phone Call Additional follow up Details #1::        Spoke with patient, she states she took 1st dose of MoviPrep this am at 5:00 am,she misread prep instructions. She is going to bathroom having BM's. She is drinking clear liquids only today and she has 2nd dose prep left. Her procedure tomorrow, should she cancel? What should she do for procedure? Please advise. Additional Follow-up by: Sherren Kerns RN,  November 03, 2010 4:10 PM    Additional Follow-up for Phone Call Additional follow up Details #2::    Repeat prep @5am .  stay on clears Follow-up by: Louis Meckel MD,  November 03, 2010 5:00 PM  Additional Follow-up for Phone Call Additional follow up Details #3:: Details for Additional Follow-up Action Taken: Called patient and notified her of Dr.Kaplan's orders. She understands this. Patient had no further questions or concerns at this time. Told patient to call 24# listed on instructions given to her during previsit if she needs to speak with someone tonight. She understands. Additional Follow-up by: Sherren Kerns RN,  November 03, 2010 5:19 PM

## 2011-01-01 NOTE — Consult Note (Signed)
Summary: [facility]  [facility]   Imported By: Lester Atwood 06/30/2010 09:47:39  _____________________________________________________________________  External Attachment:    Type:   Image     Comment:   External Document

## 2011-01-01 NOTE — Progress Notes (Signed)
  Phone Note Refill Request Message from:  Fax from Pharmacy on March 03, 2010 9:43 AM  Refills Requested: Medication #1:  KLONOPIN 1 MG TABS 1-2 by mouth at bedtime as needed for insomnia   Last Refilled: 02/17/2010 Please Advise refill  Initial call taken by: Ami Bullins CMA,  March 03, 2010 9:45 AM  Follow-up for Phone Call        ok for refill x 3 Follow-up by: Jacques Navy MD,  March 03, 2010 12:15 PM    Prescriptions: KLONOPIN 1 MG TABS (CLONAZEPAM) 1-2 by mouth at bedtime as needed for insomnia  #30 x 3   Entered by:   Ami Bullins CMA   Authorized by:   Jacques Navy MD   Signed by:   Bill Salinas CMA on 03/03/2010   Method used:   Telephoned to ...       [facility] [address] [phone]* (retail)       [address]       [place], [place]  [address]       Ph: [phone] or [phone]       Fax: [phone]   RxID:   [id number]

## 2011-01-01 NOTE — Procedures (Signed)
Summary: Colonoscopy / [facility]  Colonoscopy / [facility]   Imported By: Lennie Odor 11/28/2010 14:43:18  _____________________________________________________________________  External Attachment:    Type:   Image     Comment:   External Document

## 2011-01-01 NOTE — Progress Notes (Signed)
Summary: handicap form  Phone Note Call from Patient   Caller: Patient Call For: sood Summary of Call: pt would like handicap form filled out and mailed to her home Initial call taken by: Rickard Patience,  June 23, 2010 2:56 PM  Follow-up for Phone Call        form filled out, envelope filled out and handed to [facility] for VS to sign when he is back in the office tomorrow afternoon. Boone Master CNA/MA  June 23, 2010 3:08 PM      Appended Document: handicap form correction:  pt is a [facility] patient, not VS.  placard filled out and placed on CDY's cart to be signed and mailed to pt's home address.

## 2011-01-01 NOTE — Progress Notes (Signed)
  Phone Note Refill Request   Refills Requested: Medication #1:  HYDROCODONE-ACETAMINOPHEN 5-500 MG  TABS 1 three times a day   Dosage confirmed as above?Dosage Confirmed   Brand Name Necessary? No   Supply Requested: 1 month   Last Refilled: 01/14/2010 last office visit 09/30/2009, please advise refill  Initial call taken by: Ami Bullins CMA,  February 10, 2010 9:09 AM  Follow-up for Phone Call        ok Follow-up by: Etta Grandchild MD,  February 10, 2010 9:24 AM    Prescriptions: HYDROCODONE-ACETAMINOPHEN 5-500 MG  TABS (HYDROCODONE-ACETAMINOPHEN) 1 three times a day  #90 x 2   Entered by:   Bill Salinas CMA   Authorized by:   Jacques Navy MD   Signed by:   Bill Salinas CMA on 02/10/2010   Method used:   Telephoned to ...       [facility] [address] [address] [phone]* (retail)       [address]       [place], [place]  [address]       Ph: [phone] or [phone]       Fax: [phone]   RxID:   [id number]

## 2011-01-01 NOTE — Assessment & Plan Note (Signed)
Summary: rov/jd   Copy to:  n/a Primary Necha Harries/Referring Zyrus Hetland:  Illene Regulus, MD  CC:  6 month f/u appt. mild increased sob and non-productive cough and headache..  History of Present Illness: January 09, 2010-  Chest cold onset 2 weeks. Took doxy from NP without help. Head hurts, cough green, fever. Feels rattle this afternoon but now nonproductive. Some nausea and loose stoools. Hurt across back and chest with some choking sensation 2 nights ago- suggestivre of a reflux event. Some hurting around under left lower lateral ribs- tussive but not pleuritic otherwise.  May 12, 2010- Food allergy, allergic rhinitis, Asthma/ COPD Breathing doing well. Nasal drip which improves for a while after she takes doxycycline. Now wearing a boot after fell breaking left ankle- no surgery. Avoids problem foods successfully. Denies significant pollen rhinitis this Spring.  Uses rescue inhaler rarely and does well outside unless windy or exposed to cigrettes.  November 20, 2010-  Food allergy, allergic rhinitis, Asthma/ COPD Nurse-CC: 6 month f/u appt. mild increased sob, non-productive cough and headache. Allergy vaccine f/u. Has done well on 1:10, given here. Cleared last week after doxycycline- has refillable script. . Then moved furniture and feels congested and short of breath. Minor sore throat w/o fever. Only coughing at night, non productive. Frontal headache w/o nasal congestion.      Asthma History    Asthma Control Assessment:    Age range: 12+ years    Symptoms: 0-2 days/week    Nighttime Awakenings: 0-2/month    Interferes w/ normal activity: no limitations    SABA use (not for EIB): 0-2 days/week    FEV1: 1.39 liters (today)    FEV1 Pred: 2.28 liters (today)    Asthma Control Assessment: Not Well Controlled   Preventive Screening-Counseling & Management  Alcohol-Tobacco     Smoking Status: quit     Year Started: 1955     Year Quit: 1997     Pack years: 1yrs, 3ppd  Passive Smoke Exposure: no  Caffeine-Diet-Exercise     Caffeine use/day: 4     Does Patient Exercise: yes  Current Medications (verified): 1)  Symbicort 80-4.5 Mcg/act Aero (Budesonide-Formoterol Fumarate) .... 2 Puffs Two Times A Day 2)  Astelin 137 Mcg/spray  Soln (Azelastine Hcl) .Marland Kitchen.. 1 Puff Each Nostril At Bedtime 3)  Allergy Vaccine 1:10 Gh .... Advance Next Order From 1:50 To 1:10 Per Protocol 4)  Proair Hfa 108 (90 Base) Mcg/act Aers (Albuterol Sulfate) .... Inhale 2 Puffs Every 4-6 Hours As Needed For Shortness of Breath 5)  Metformin Hcl 1000 Mg  Tabs (Metformin Hcl) .... Take One Tablet Twice Daily 6)  Levoxyl 75 Mcg  Tabs (Levothyroxine Sodium) .... Take One Tablet Once Daily 7)  Lantus 100 Unit/ml  Soln (Insulin Glargine) .... 40 Units Once Daily 8)  Calcium Carbonate-Vitamin D 600-400 Mg-Unit  Tabs (Calcium Carbonate-Vitamin D) .... Take 1 Tablet By Mouth Two Times A Day 9)  Hydrocodone-Acetaminophen 5-500 Mg  Tabs (Hydrocodone-Acetaminophen) .Marland Kitchen.. 1 Three Times A Day 10)  Aciphex 20 Mg  Tbec (Rabeprazole Sodium) .... Take 1 Tablet By Mouth Once A Day 11)  Venlafaxine Hcl 75 Mg Tabs (Venlafaxine Hcl) .Marland Kitchen.. 1 By Mouth Two Times A Day 12)  Diovan 160 Mg Tabs (Valsartan) .Marland Kitchen.. 1 Tab Once Daily/ 13)  Simvastatin 40 Mg Tabs (Simvastatin) .... Take 1 Tablet By Mouth Once A Day 14)  Eye Vitamins   Caps (Multiple Vitamins-Minerals) .... Take One Tablet Once Daily 15)  Multivitamins   Tabs (Multiple  Vitamin) .... Take 1 Tablet By Mouth Once A Day 16)  Cranberry 500 Mg Caps (Cranberry) .... Take 1 Capsule By Mouth Once A Day 17)  Excedrin Extra Strength 250-250-65 Mg Tabs (Aspirin-Acetaminophen-Caffeine) .... Per Bottle As Needed 18)  Cvs Saline Nasal Spray 0.65 % Soln (Saline) .... 2 Puffs Every 4 Hours As Needed 19)  Bd Integra Insulin Syringe 29g X 1/2" 1 Ml  Misc (Insulin Syringe-Needle U-100) .... #30 For Use With Lantus 20)  Novofine 30g X 8 Mm  Misc (Insulin Pen Needle) .... Use With  Novolog Pen 21)  Accu-Chek Aviva  Strp (Glucose Blood) .... Use 1 Test Strip Every Cbg Check and Check Cbg 4 Times A Day 22)  Sudafed 12 Hour 120 Mg Xr12h-Tab (Pseudoephedrine Hcl) .... As Needed 23)  Zetia 10 Mg Tabs (Ezetimibe) .... Take 1 Tablet By Mouth Once A Day 24)  Iron 325 (65 Fe) Mg Tabs (Ferrous Sulfate) .Marland Kitchen.. 1 By Mouth Once Daily 25)  Spiriva Handihaler 18 Mcg Caps (Tiotropium Bromide Monohydrate) .Marland Kitchen.. 1 Inhalation Daily 26)  Klonopin 1 Mg Tabs (Clonazepam) .Marland Kitchen.. 1-2 By Mouth At Bedtime As Needed For Insomnia 27)  Nitrofurantoin Monohyd Macro 100 Mg Caps (Nitrofurantoin Monohyd Macro) .... Take 1 By Mouth At Bedtime 28)  Albuterol Sulfate (2.5 Mg/17ml) 0.083% Nebu (Albuterol Sulfate) .... Four Times A Day As Needed For Sob 29)  Lancets of Pt's Choice .... Dx:250.00 Insulin Dependent Test Three Times A Day As Needed 30)  Furosemide 40 Mg Tabs (Furosemide) .Marland Kitchen.. 1 By Mouth Once Daily For Htn  Allergies (verified): 1)  ! Lisinopril 2)  ! Codeine 3)  ! Demerol 4)  ! Asa 5)  ! 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) FOOT SURGERY, HX OF (ICD-V15.89) HYSTERECTOMY, HX OF (ICD-V45.77) HYPERLIPIDEMIA (ICD-272.4) DEPRESSION (ICD-311) HYPERTENSION (ICD-401.9) HYPOTHYROIDISM (ICD-244.9) DIABETES MELLITUS (ICD-250.00) SINUSITIS (ICD-473.9) ALLERGIC RHINITIS (ICD-477.9) ASTHMA (ICD-493.90) POSTNASAL DRIP (ICD-784.91) CONJUNCTIVITIS, MILD (ICD-372.30) ALLERGY, FOOD (ICD-693.1) C O P D (ICD-496) COUGH (ICD-786.2) BRONCHITIS, ACUTE (ICD-466.0) Neg cardiac sress test - 08/21/2008  Past Surgical History: Last updated: 08/12/2009 FOOT SURGERY, HX OF (ICD-V15.89) hammer toe  '04,  '04/12/2023 HYSTERECTOMY, HX OF (ICD-V45.77) 18-Apr-1981, menometorrhagia Carpaal tunnnel bilateral....................Marland KitchenDr. Merlyn Lot -> 03/05/2009  Family History: Last updated: 05-31-2009 father-died 2065-04-18, lung cancer, HTN, DM mother-died Apr 12, 2023, HTN, DM, CHF, CAD 2 brother - 1 one agent orange related asthma; allergies 1 sister - breast cancer Neg- colon cancer allergic rhinitis Family History of Liver Cancer:Father (mets) Family History of Prostate Cancer:Father   Social History: Last updated: 09/15/2010 10th grade married - 1965-divorced after 5 yrs; married 41- divorced 2 yrs; married 1975 3 sons - 19-Apr-2067, 1974-04-18, Apr 18, 1976'; 3 daughters - 05/20/692071/05/20, Apr 18, 1978 grandchildren 10; 2 great-grandchildren 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  Patient states former smoker. Stop 9 yrs ago Environment: House with crawl space, central air conditioning, hard wood.  No feather bedding, no mold.  Son smokes.  Pets including dogs, cats, 3, birds. Angioedema with Aspirin. Alcohol Use - no Daily Caffeine Use two cups a day Illicit Drug Use - no Patient gets regular exercise.  Risk Factors: Caffeine Use: 4 (11/20/2010) Exercise: yes (11/20/2010)  Risk Factors: Smoking Status: quit (11/20/2010) Passive Smoke Exposure: no (11/20/2010)  Review  of Systems      See HPI       The patient complains of shortness of breath with activity and non-productive cough.  The patient denies shortness of breath at rest, productive cough, coughing up blood, chest pain, irregular heartbeats, acid heartburn, indigestion, loss of appetite, weight change, abdominal pain, difficulty swallowing, sore throat, tooth/dental problems, headaches, nasal congestion/difficulty breathing through nose, and sneezing.    Vital Signs:  Patient profile:   65 year old female Height:      63 inches Weight:      228.25 pounds BMI:     40.58 O2 Sat:      97 % on Room air Pulse rate:   82 / minute BP  sitting:   130 / 68  (left arm) Cuff size:   large  Vitals Entered By: Arman Filter LPN (November 20, 2010 9:20 AM)  O2 Flow:  Room air CC: 6 month f/u appt. mild increased sob, non-productive cough and headache. Comments Medications reviewed with patient Arman Filter LPN  November 20, 2010 9:26 AM    Physical Exam  Additional Exam:  General: A/Ox3; pleasant and cooperative, NAD,  seriously overweight, calm SKIN: clear NODES: no lymphadenopathy HEENT: [place]/AT, EOM- WNL, Conjuctivae- clear, PERRLA, TM-WNL, Nose- clear, Throat- clear and wnl, Mallampati III, dentures,   NECK: Supple w/ fair ROM, JVD- none, normal carotid impulses w/o bruits Thyroid-  CHEST: Clear to P&A HEART: RRR, no m/g/r heard ABDOMEN: Soft  UEA:VWUJ, nl pulses, no edema  NEURO: Grossly intact to observation, head bob tremor      Pre-Spirometry FEV1    Value: 1.39 L     Pred: 2.28 L     Impression & Recommendations:  Problem # 1:  OTHER MALAISE AND FATIGUE (ICD-780.79) She complains of "just don't feel well" that interfered with sleep last night, but she has trouble being more specific.  She felt better after doixycycline, but I don't see obivious infection.   Problem # 2:  ALLERGIC RHINITIS (ICD-477.9)  She feels that allergy vaccine has helped her. Not having to miss her shots often. Transportation is a problem.  Her updated medication list for this problem includes:    Astelin 137 Mcg/spray Soln (Azelastine hcl) .Marland Kitchen... 1 puff each nostril at bedtime    Cvs Saline Nasal Spray 0.65 % Soln (Saline) .Marland Kitchen... 2 puffs every 4 hours as needed    Sudafed 12 Hour 120 Mg Xr12h-tab (Pseudoephedrine hcl) .Marland Kitchen... As needed  Problem # 3:  CHRONIC OBSTRUCTIVE PULMONARY DISEASE, ACUTE EXACERBATION (ICD-491.21) She had responded to doxycycline, then cough and dypnea worsened again. Weight  and deconditioning contribute to exertional limitation.   Other Orders: Est. Patient Level III (81191)  Patient Instructions: 1)   Please schedule a follow-up appointment in 6 months. 2)  Continue allergy vaccine. 3)  See Dr Debby Bud if you have concerns about your BP meds.    Immunization History:  Influenza Immunization History:    Influenza:  historical (06/30/2010)

## 2011-01-01 NOTE — Medication Information (Signed)
Summary: Diabetic supplies/[facility]  [facility]abetic [facility]   Imported By: Lester Glynn 12/25/2009 15:04:46  _____________________________________________________________________  External Attachment:    Type:   Image     Comment:   External Document

## 2011-01-08 DIAGNOSIS — J301 Allergic rhinitis due to pollen: Secondary | ICD-10-CM

## 2011-01-16 ENCOUNTER — Ambulatory Visit (INDEPENDENT_AMBULATORY_CARE_PROVIDER_SITE_OTHER): Payer: PRIVATE HEALTH INSURANCE

## 2011-01-16 DIAGNOSIS — J301 Allergic rhinitis due to pollen: Secondary | ICD-10-CM

## 2011-01-21 NOTE — Miscellaneous (Signed)
Summary: [facility] [employer]   Imported By: Sherian Rein 01/15/2011 08:55:01  _____________________________________________________________________  External Attachment:    Type:   Image     Comment:   External Document

## 2011-01-30 ENCOUNTER — Ambulatory Visit (INDEPENDENT_AMBULATORY_CARE_PROVIDER_SITE_OTHER): Payer: 59

## 2011-01-30 ENCOUNTER — Encounter: Payer: Self-pay | Admitting: Internal Medicine

## 2011-01-30 DIAGNOSIS — J301 Allergic rhinitis due to pollen: Secondary | ICD-10-CM | POA: Insufficient documentation

## 2011-02-02 ENCOUNTER — Telehealth: Payer: Self-pay | Admitting: Internal Medicine

## 2011-02-05 ENCOUNTER — Telehealth (INDEPENDENT_AMBULATORY_CARE_PROVIDER_SITE_OTHER): Payer: Self-pay | Admitting: *Deleted

## 2011-02-05 NOTE — Assessment & Plan Note (Signed)
Summary: allergy/cb  Nurse Visit   Allergies: 1)  ! Lisinopril 2)  ! Codeine 3)  ! Demerol 4)  ! Asa 5)  ! * Latex 6)  * Lidocaine Patch  Orders Added: 1)  Allergy Injection (1) [16109]

## 2011-02-08 ENCOUNTER — Emergency Department (HOSPITAL_COMMUNITY): Payer: PRIVATE HEALTH INSURANCE

## 2011-02-08 ENCOUNTER — Emergency Department (HOSPITAL_COMMUNITY)
Admission: EM | Admit: 2011-02-08 | Discharge: 2011-02-08 | Disposition: A | Discharge status: Home / Self Care | Payer: PRIVATE HEALTH INSURANCE | Attending: Emergency Medicine | Admitting: Emergency Medicine

## 2011-02-08 DIAGNOSIS — E039 Hypothyroidism, unspecified: Secondary | ICD-10-CM | POA: Insufficient documentation

## 2011-02-08 DIAGNOSIS — J449 Chronic obstructive pulmonary disease, unspecified: Secondary | ICD-10-CM | POA: Insufficient documentation

## 2011-02-08 DIAGNOSIS — R05 Cough: Secondary | ICD-10-CM | POA: Insufficient documentation

## 2011-02-08 DIAGNOSIS — R071 Chest pain on breathing: Secondary | ICD-10-CM | POA: Insufficient documentation

## 2011-02-08 DIAGNOSIS — R0602 Shortness of breath: Secondary | ICD-10-CM | POA: Insufficient documentation

## 2011-02-08 DIAGNOSIS — J4489 Other specified chronic obstructive pulmonary disease: Secondary | ICD-10-CM | POA: Insufficient documentation

## 2011-02-08 DIAGNOSIS — R509 Fever, unspecified: Secondary | ICD-10-CM | POA: Insufficient documentation

## 2011-02-08 DIAGNOSIS — Z794 Long term (current) use of insulin: Secondary | ICD-10-CM | POA: Insufficient documentation

## 2011-02-08 DIAGNOSIS — E119 Type 2 diabetes mellitus without complications: Secondary | ICD-10-CM | POA: Insufficient documentation

## 2011-02-08 DIAGNOSIS — R059 Cough, unspecified: Secondary | ICD-10-CM | POA: Insufficient documentation

## 2011-02-08 DIAGNOSIS — D649 Anemia, unspecified: Secondary | ICD-10-CM | POA: Insufficient documentation

## 2011-02-08 DIAGNOSIS — Z79899 Other long term (current) drug therapy: Secondary | ICD-10-CM | POA: Insufficient documentation

## 2011-02-08 DIAGNOSIS — I10 Essential (primary) hypertension: Secondary | ICD-10-CM | POA: Insufficient documentation

## 2011-02-08 DIAGNOSIS — R070 Pain in throat: Secondary | ICD-10-CM | POA: Insufficient documentation

## 2011-02-09 LAB — CBC
HCT: 38 % (ref 36.0–46.0)
Hemoglobin: 12.4 g/dL (ref 12.0–15.0)
RBC: 4.24 MIL/uL (ref 3.87–5.11)
WBC: 5.9 10*3/uL (ref 4.0–10.5)

## 2011-02-09 LAB — GLUCOSE, CAPILLARY: Glucose-Capillary: 119 mg/dL — ABNORMAL HIGH (ref 70–99)

## 2011-02-10 NOTE — Progress Notes (Signed)
Summary: sick > zpak rx  Phone Note Call from Patient Call back at Home Phone [phone]   Caller: Patient Call For: young Reason for Call: Talk to Nurse Summary of Call: Patient calling requesting appt w/ Dr. Maple Hudson.  c/o sob, fever of 102.0, wheezing, bad productive and dry cough x 3 weeks.  Patient has tried doxycycline w/ no help. Initial call taken by: Lehman Prom,  February 05, 2011 8:15 AM  Follow-up for Phone Call        Called, spoke with pt.  States she has been sick x 3-4 wks.  c/o prod cough with clear mucus, soreness and tightness in chest, wheezing, weakness, and fever yesterday of 102.0.  States she finished a 6 day course of doxy last week with no relief.  Also using resue HFA and breathing txs with no relief.   Requesting OV.  Dr. Maple Hudson, pls advise    Allergies - verified:  ! LISINOPRIL ! CODEINE ! DEMEROL ! ASA ! * LATEX * LIDOCAINE PATCH  [facility] [address] Follow-up by: Gweneth Dimitri RN,  February 05, 2011 9:17 AM  Additional Follow-up for Phone Call Additional follow up Details #1::        Per CDY-okay to give Zpak #1 take as directed no refills.Reynaldo Minium CMA  February 05, 2011 2:26 PM   Called, spoke with pt.  She was informed CDY recs zpak take as directed. She verbalized understanding and aware rx sent to pharm.  Pt will call back if sxs do not improve or worsen. Additional Follow-up by: Gweneth Dimitri RN,  February 05, 2011 2:30 PM    New/Updated Medications: ZITHROMAX Z-PAK 250 MG TABS (AZITHROMYCIN) take as directed Prescriptions: ZITHROMAX Z-PAK 250 MG TABS (AZITHROMYCIN) take as directed  #1 x 0   Entered by:   Gweneth Dimitri RN   Authorized by:   Waymon Budge MD   Signed by:   Gweneth Dimitri RN on 02/05/2011   Method used:   Electronically to        [facility] [address] [phone]* (retail)       [address]       [place], [place]  [address]       Ph: [phone] or [phone]       Fax: [phone]   RxID:    [id number]

## 2011-02-10 NOTE — Progress Notes (Signed)
  Phone Note Refill Request Message from:  Fax from Pharmacy on February 02, 2011 1:53 PM  Refills Requested: Medication #1:  HYDROCODONE-ACETAMINOPHEN 5-500 MG  TABS 1 three times a day Please advise refills  Initial call taken by: Ami Bullins CMA,  February 02, 2011 1:53 PM  Follow-up for Phone Call        ok for refill x 3 Follow-up by: Jacques Navy MD,  February 02, 2011 6:14 PM    Prescriptions: HYDROCODONE-ACETAMINOPHEN 5-500 MG  TABS (HYDROCODONE-ACETAMINOPHEN) 1 three times a day  #270 x 3   Entered by:   Ami Bullins CMA   Authorized by:   Jacques Navy MD   Signed by:   Bill Salinas CMA on 02/03/2011   Method used:   Telephoned to ...       [facility] [address] [phone]* (retail)       [address]       [place], [place]  [address]       Ph: [phone] or [phone]       Fax: [phone]   RxID:   [id number]

## 2011-02-11 ENCOUNTER — Encounter: Payer: Self-pay | Admitting: Internal Medicine

## 2011-02-11 ENCOUNTER — Ambulatory Visit (INDEPENDENT_AMBULATORY_CARE_PROVIDER_SITE_OTHER): Payer: 59

## 2011-02-11 ENCOUNTER — Encounter: Payer: Self-pay | Admitting: Adult Health

## 2011-02-11 ENCOUNTER — Telehealth (INDEPENDENT_AMBULATORY_CARE_PROVIDER_SITE_OTHER): Payer: Self-pay | Admitting: *Deleted

## 2011-02-11 ENCOUNTER — Ambulatory Visit (INDEPENDENT_AMBULATORY_CARE_PROVIDER_SITE_OTHER): Payer: PRIVATE HEALTH INSURANCE | Admitting: Adult Health

## 2011-02-11 DIAGNOSIS — J301 Allergic rhinitis due to pollen: Secondary | ICD-10-CM

## 2011-02-11 DIAGNOSIS — J449 Chronic obstructive pulmonary disease, unspecified: Secondary | ICD-10-CM

## 2011-02-12 LAB — CBC
HCT: 38 % (ref 36.0–46.0)
Hemoglobin: 12.4 g/dL (ref 12.0–15.0)
MCHC: 32.6 g/dL (ref 30.0–36.0)
MCV: 89.4 fL (ref 78.0–100.0)
RDW: 12.9 % (ref 11.5–15.5)
WBC: 9.9 10*3/uL (ref 4.0–10.5)

## 2011-02-12 LAB — DIFFERENTIAL
Basophils Absolute: 0 10*3/uL (ref 0.0–0.1)
Basophils Relative: 0 % (ref 0–1)
Eosinophils Absolute: 0.1 10*3/uL (ref 0.0–0.7)
Monocytes Relative: 8 % (ref 3–12)
Neutro Abs: 7.3 10*3/uL (ref 1.7–7.7)
Neutrophils Relative %: 74 % (ref 43–77)

## 2011-02-12 LAB — URINALYSIS, ROUTINE W REFLEX MICROSCOPIC
Glucose, UA: NEGATIVE mg/dL
Hgb urine dipstick: NEGATIVE
Specific Gravity, Urine: 1.007 (ref 1.005–1.030)
pH: 5.5 (ref 5.0–8.0)

## 2011-02-12 LAB — URINE CULTURE: Culture  Setup Time: 201109281347

## 2011-02-12 LAB — POCT CARDIAC MARKERS
Myoglobin, poc: 114 ng/mL (ref 12–200)
Troponin i, poc: <0.05 ng/mL (ref 0.00–0.09)

## 2011-02-12 LAB — COMPREHENSIVE METABOLIC PANEL
ALT: 14 U/L (ref 0–35)
Alkaline Phosphatase: 73 U/L (ref 39–117)
BUN: 18 mg/dL (ref 6–23)
CO2: 28 mEq/L (ref 19–32)
GFR calc non Af Amer: >60 mL/min (ref >60)
Glucose, Bld: 91 mg/dL (ref 70–99)
Potassium: 3.5 mEq/L (ref 3.5–5.1)
Sodium: 140 mEq/L (ref 135–145)
Total Protein: 6.2 g/dL (ref 6.0–8.3)

## 2011-02-12 LAB — LIPASE, BLOOD: Lipase: 29 U/L (ref 11–59)

## 2011-02-12 LAB — URINE MICROSCOPIC-ADD ON

## 2011-02-16 ENCOUNTER — Ambulatory Visit (INDEPENDENT_AMBULATORY_CARE_PROVIDER_SITE_OTHER): Payer: 59

## 2011-02-16 ENCOUNTER — Encounter: Payer: Self-pay | Admitting: Internal Medicine

## 2011-02-16 DIAGNOSIS — J301 Allergic rhinitis due to pollen: Secondary | ICD-10-CM

## 2011-02-17 ENCOUNTER — Ambulatory Visit (INDEPENDENT_AMBULATORY_CARE_PROVIDER_SITE_OTHER): Payer: 59

## 2011-02-17 DIAGNOSIS — J301 Allergic rhinitis due to pollen: Secondary | ICD-10-CM

## 2011-02-17 NOTE — Assessment & Plan Note (Signed)
Summary: cough///kp   Copy to:  n/a Primary Provider/Referring Provider:  Illene Regulus, MD  CC:  c/o cough, clear mucous, pt says I have been sick x 1 month, finished a round of abx 02/05/11 by Dr Maple Hudson, still no better.sob with exertion chest tightness wheezing, and c/o right ear pain and headaches.  Symbicort and spiriva not helping much.  History of Present Illness: January 09, 2010-  Chest cold onset 2 weeks. Took doxy from NP without help. Head hurts, cough green, fever. Feels rattle this afternoon but now nonproductive. Some nausea and loose stoools. Hurt across back and chest with some choking sensation 2 nights ago- suggestivre of a reflux event. Some hurting around under left lower lateral ribs- tussive but not pleuritic otherwise.  May 12, 2010- Food allergy, allergic rhinitis, Asthma/ COPD Breathing doing well. Nasal drip which improves for a while after she takes doxycycline. Now wearing a boot after fell breaking left ankle- no surgery. Avoids problem foods successfully. Denies significant pollen rhinitis this Spring.  Uses rescue inhaler rarely and does well outside unless windy or exposed to cigrettes.  November 20, 2010-  Food allergy, allergic rhinitis, Asthma/ COPD Nurse-CC: 6 month f/u appt. mild increased sob, non-productive cough and headache. Allergy vaccine f/u. Has done well on 1:10, given here. Cleared last week after doxycycline- has refillable script. . Then moved furniture and feels congested and short of breath. Minor sore throat w/o fever. Only coughing at night, non productive. Frontal headache w/o nasal congestion.     February 11, 2011 --Presents for an acute office visit. Complains of  cough, clear mucous, pt says I have been sick x 1 month,  finished a round of abx 02/05/11 by Dr Maple Hudson, still no better.sob with exertion chest tightness wheezing, c/o right ear pain and headaches.  Symbicort and spiriva not helping much. Using otc with much help. Denies chest pain,   orthopnea, hemoptysis, fever, n/v/d, edema, headache.  Of note she is on macrodantin for chronic UTI --chest xray reviewed 11/2010 was clear.  Former smoker.   Current Medications (verified): 1)  Symbicort 80-4.5 Mcg/act Aero (Budesonide-Formoterol Fumarate) .... 2 Puffs Two Times A Day 2)  Astelin 137 Mcg/spray  Soln (Azelastine Hcl) .Marland Kitchen.. 1 Puff Each Nostril At Bedtime 3)  Allergy Vaccine 1:10 Gh .... Advance Next Order From 1:50 To 1:10 Per Protocol 4)  Proair Hfa 108 (90 Base) Mcg/act Aers (Albuterol Sulfate) .... Inhale 2 Puffs Every 4-6 Hours As Needed For Shortness of Breath 5)  Metformin Hcl 1000 Mg  Tabs (Metformin Hcl) .... Take One Tablet Twice Daily 6)  Levoxyl 75 Mcg  Tabs (Levothyroxine Sodium) .... Take One Tablet Once Daily 7)  Lantus 100 Unit/ml  Soln (Insulin Glargine) .... 40 Units Once Daily 8)  Calcium Carbonate-Vitamin D 600-400 Mg-Unit  Tabs (Calcium Carbonate-Vitamin D) .... Take 1 Tablet By Mouth Two Times A Day 9)  Hydrocodone-Acetaminophen 5-500 Mg  Tabs (Hydrocodone-Acetaminophen) .Marland Kitchen.. 1 Three Times A Day 10)  Aciphex 20 Mg  Tbec (Rabeprazole Sodium) .... Take 1 Tablet By Mouth Once A Day 11)  Venlafaxine Hcl 75 Mg Tabs (Venlafaxine Hcl) .Marland Kitchen.. 1 By Mouth Two Times A Day 12)  Diovan 160 Mg Tabs (Valsartan) .Marland Kitchen.. 1 Tab Once Daily/ 13)  Simvastatin 40 Mg Tabs (Simvastatin) .... Take 1 Tablet By Mouth Once A Day 14)  Eye Vitamins   Caps (Multiple Vitamins-Minerals) .... Take One Tablet Once Daily 15)  Multivitamins   Tabs (Multiple Vitamin) .... Take 1 Tablet By Mouth  Once A Day 16)  Cranberry 500 Mg Caps (Cranberry) .... Take 1 Capsule By Mouth Once A Day 17)  Excedrin Extra Strength 250-250-65 Mg Tabs (Aspirin-Acetaminophen-Caffeine) .... Per Bottle As Needed 18)  Cvs Saline Nasal Spray 0.65 % Soln (Saline) .... 2 Puffs Every 4 Hours As Needed 19)  Bd Integra Insulin Syringe 29g X 1/2" 1 Ml  Misc (Insulin Syringe-Needle U-100) .... #30 For Use With Lantus 20)  Novofine  30g X 8 Mm  Misc (Insulin Pen Needle) .... Use With Novolog Pen 21)  Accu-Chek Aviva  Strp (Glucose Blood) .... Use 1 Test Strip Every Cbg Check and Check Cbg 4 Times A Day 22)  Sudafed 12 Hour 120 Mg Xr12h-Tab (Pseudoephedrine Hcl) .... As Needed 23)  Zetia 10 Mg Tabs (Ezetimibe) .... Take 1 Tablet By Mouth Once A Day 24)  Iron 325 (65 Fe) Mg Tabs (Ferrous Sulfate) .Marland Kitchen.. 1 By Mouth Once Daily 25)  Spiriva Handihaler 18 Mcg Caps (Tiotropium Bromide Monohydrate) .Marland Kitchen.. 1 Inhalation Daily 26)  Klonopin 1 Mg Tabs (Clonazepam) .Marland Kitchen.. 1-2 By Mouth At Bedtime As Needed For Insomnia 27)  Nitrofurantoin Monohyd Macro 100 Mg Caps (Nitrofurantoin Monohyd Macro) .... Take 1 By Mouth At Bedtime 28)  Albuterol Sulfate (2.5 Mg/7ml) 0.083% Nebu (Albuterol Sulfate) .... Four Times A Day As Needed For Sob 29)  Lancets of Pt's Choice .... Dx:250.00 Insulin Dependent Test Three Times A Day As Needed 30)  Furosemide 40 Mg Tabs (Furosemide) .Marland Kitchen.. 1 By Mouth Once Daily For Htn  Allergies: 1)  ! Lisinopril 2)  ! Codeine 3)  ! Demerol 4)  ! Asa 5)  ! 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 Merlyn Lot -> 03/05/2009  Family History: Last updated:  28-May-2009 father-died 15-Apr-2065, lung cancer, HTN, DM mother-died Apr 09, 2023, HTN, DM, CHF, CAD 2 brother - 1 one agent orange related asthma; allergies 1 sister - breast cancer Neg- colon cancer allergic rhinitis Family History of Liver Cancer:Father (mets) Family History of Prostate Cancer:Father   Social History: Last updated: 09/15/2010 10th grade married - 1965-divorced after 5 yrs; married 26- divorced 2 yrs; married 1975 3 sons - 2067-04-16, 1974/04/15, 04-15-1976'; 3 daughters - 2069/05/17May 17, 2071, 04/15/78 grandchildren 10; 2 great-grandchildren 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  Patient states former smoker. Stop 9 yrs ago Environment: House with crawl space, central air conditioning, hard wood.  No feather bedding, no mold.  Son smokes.  Pets including dogs, cats, 3, birds. Angioedema with Aspirin. Alcohol Use - no Daily Caffeine Use two cups a day Illicit Drug Use - no Patient gets regular exercise.  Review of Systems      See HPI  Vital Signs:  Patient profile:   65 year old female Height:      63 inches Weight:      233 pounds BMI:     41.42 O2 Sat:      97 %  on Room air Temp:     97.3 degrees F oral Pulse rate:   64 / minute BP sitting:   140 / 66  (left arm) Cuff size:   regular  Vitals Entered By: Kandice Hams CMA (February 11, 2011 10:44 AM)  O2 Flow:  Room air CC: c/o cough, clear mucous, pt says I have been sick x 1 month,  finished a round of abx 02/05/11 by Dr Maple Hudson, still no better.sob with exertion chest tightness wheezing, c/o right ear pain and headaches.  Symbicort and spiriva not helping much Comments meds verified pharmacy verfied .Kandice Hams CMA  February 11, 2011 10:49 AM    Physical Exam  Additional Exam:  General: A/Ox3; pleasant and cooperative, NAD,  overweight, calm SKIN: clear NODES: no lymphadenopathy HEENT: [place]/AT, EOM- WNL, Conjuctivae- clear, PERRLA, TM-WNL, Nose- clear drainge , Throat- clear and wnl,  Mallampati III, dentures,   NECK: Supple w/ fair ROM, JVD- none, normal carotid impulses w/o bruits Thyroid-  CHEST : Coarse BS w/ faint wheeze HEART: RRR, no m/g/r heard ABDOMEN: Soft  BJY:NWGN, nl pulses, no edema  NEURO: Grossly intact to observation, head bob tremor      Impression & Recommendations:  Problem # 1:  C O P D (ICD-496) Exacerbation -slow to resolve no further abx for now.  Plan:  Prednisone taper over next week.  Mucinex DM two times a day as needed cough  Hydromet 1/2-1 tsp every 4-6 hrs as needed cough -may makeyou sleepy.  Allegra 180mg  once daily  Saline nasal rinses as needed  Please contact office for sooner follow up if symptoms do not improve or worsen   Medications Added to Medication List This Visit: 1)  Prednisone 10 Mg Tabs (Prednisone) .... 4 tabs for 2 days, then 3 tabs for 2 days, 2 tabs for 2 days, then 1 tab for 2 days, then stop 2)  Hydromet 5-1.5 Mg/46ml Syrp (Hydrocodone-homatropine) .... 1/2 -1 tsp every 4-6 hr as needed cough , may make you sleepy  Other Orders: Est. Patient Level IV (56213) DME Referral (DME)  Patient Instructions: 1)  Prednisone taper over next week.  2)  Mucinex DM two times a day as needed cough  3)  Hydromet 1/2-1 tsp every 4-6 hrs as needed cough -may makeyou sleepy.  4)  Allegra 180mg  once daily  5)  Saline nasal rinses as needed  6)  Please contact office for sooner follow up if symptoms do not improve or worsen  Prescriptions: HYDROMET 5-1.5 MG/5ML SYRP (HYDROCODONE-HOMATROPINE) 1/2 -1 tsp every 4-6 hr as needed cough , may make you sleepy  #8 oz x 0   Entered and Authorized by:   Rubye Oaks NP   Signed by:   Markie Frith NP on 02/11/2011   Method used:   Print then Give to Patient   RxID:   [id number] PREDNISONE 10 MG TABS (PREDNISONE) 4 tabs for 2 days, then 3 tabs for 2 days, 2 tabs for 2 days, then 1 tab for 2 days, then stop  #20 x 0   Entered and Authorized by:   Rubye Oaks NP   Signed  by:   Anastazia Creek NP on 02/11/2011   Method used:   Electronically to        [facility] [address] [phone]* (retail)       [address]       [place], [place]  [address]  Ph: [phone] or [phone]       Fax: [phone]   RxID:   [id number]

## 2011-02-17 NOTE — Progress Notes (Signed)
Summary: hydromet not covered<<<pt will pay out of pocket for Hydromet  Phone Note Call from Patient   Caller: Patient Call For: tammy p Summary of Call: patient phoned stated that she saw [name] P this morning and she prescribed some cough meds for her, she called her insurance and they -stated that they needed more information from the provider so they are faxing in a questionaire for this. patient can be reached at [phone]. Initial call taken by: Vedia Coffer,  February 11, 2011 1:38 PM  Follow-up for Phone Call        Called and spoke with pt and she states she doesn't want to pay for hydromet out of pocket and states her insurance was going to fax over a paper for TP to fill out and fax back to insurance to try and get medicare to pay for the hydromet.  Called and spoke with pt's insurance they state they have no idea what type of questionare pt is talking about bc they do not fax questionares to Providers. Pt insurance is not going to cover the hydromet. [name] and Dr. Maple Hudson is out of the office this afternoon, will foward message to Dr. Shelle Iron, doc of day for reccomendations Allergies:  1)  ! Lisinopril 2)  ! Codeine 3)  ! Demerol 4)  ! Asa 5)  ! * Latex 6)  * Lidocaine Patch  Mindy Silva  February 11, 2011 4:10 PM   Additional Follow-up for Phone Call Additional follow up Details #1::        let her know that a lot of the alternatives have codeine in them, which she is allergic to.  can call in tussionex, but that is more than likely more expensive than hydromet.  can try tessalon pearls 100mg   2 every 6hrs if needed  #30, no fills otherwise she will have to pay for the hydromet.  Additional Follow-up by: Barbaraann Share MD,  February 11, 2011 5:36 PM    Additional Follow-up for Phone Call Additional follow up Details #2::    Pt states she will pay for the Hydromet out of pocket and does not want the Tessalon Perles called to the pharmacy.Michel Bickers [facility]  February 11, 2011 5:43  PM

## 2011-02-17 NOTE — Assessment & Plan Note (Signed)
Summary: allergy/cb  Nurse Visit   Allergies: 1)  ! Lisinopril 2)  ! Codeine 3)  ! Demerol 4)  ! Asa 5)  ! * Latex 6)  * Lidocaine Patch  Orders Added: 1)  Allergy Injection (1) [95115] 

## 2011-02-18 LAB — URINALYSIS, ROUTINE W REFLEX MICROSCOPIC
Bilirubin Urine: NEGATIVE
Nitrite: NEGATIVE
Specific Gravity, Urine: 1.012 (ref 1.005–1.030)
Urobilinogen, UA: 0.2 mg/dL (ref 0.0–1.0)
pH: 5 (ref 5.0–8.0)

## 2011-02-18 LAB — CBC
HCT: 36 % (ref 36.0–46.0)
Hemoglobin: 12.6 g/dL (ref 12.0–15.0)
MCHC: 34.9 g/dL (ref 30.0–36.0)
RBC: 4.1 MIL/uL (ref 3.87–5.11)

## 2011-02-18 LAB — BASIC METABOLIC PANEL
CO2: 25 mEq/L (ref 19–32)
Calcium: 8.8 mg/dL (ref 8.4–10.5)
GFR calc Af Amer: >60 mL/min (ref >60)
GFR calc non Af Amer: >60 mL/min (ref >60)
Glucose, Bld: 126 mg/dL — ABNORMAL HIGH (ref 70–99)
Potassium: 3.7 mEq/L (ref 3.5–5.1)
Sodium: 139 mEq/L (ref 135–145)

## 2011-02-18 LAB — POCT CARDIAC MARKERS
CKMB, poc: 2 ng/mL (ref 1.0–8.0)
Myoglobin, poc: 114 ng/mL (ref 12–200)
Troponin i, poc: <0.05 ng/mL (ref 0.00–0.09)

## 2011-02-18 LAB — URINE MICROSCOPIC-ADD ON

## 2011-02-18 LAB — DIFFERENTIAL
Eosinophils Relative: 0 % (ref 0–5)
Lymphocytes Relative: 25 % (ref 12–46)
Monocytes Absolute: 0.5 10*3/uL (ref 0.1–1.0)
Monocytes Relative: 7 % (ref 3–12)
Neutro Abs: 4.7 10*3/uL (ref 1.7–7.7)

## 2011-02-18 LAB — URINE CULTURE: Colony Count: NO GROWTH

## 2011-02-18 LAB — GLUCOSE, CAPILLARY: Glucose-Capillary: 122 mg/dL — ABNORMAL HIGH (ref 70–99)

## 2011-02-26 ENCOUNTER — Ambulatory Visit (INDEPENDENT_AMBULATORY_CARE_PROVIDER_SITE_OTHER): Payer: 59

## 2011-02-26 DIAGNOSIS — J301 Allergic rhinitis due to pollen: Secondary | ICD-10-CM

## 2011-02-26 NOTE — Assessment & Plan Note (Signed)
Summary: allergy/cbq  Nurse Visit   Allergies: 1)  ! Lisinopril 2)  ! Codeine 3)  ! Demerol 4)  ! Asa 5)  ! * Latex 6)  * Lidocaine Patch  Orders Added: 1)  Allergy Injection (1) [16109]

## 2011-03-06 LAB — GLUCOSE, CAPILLARY: Glucose-Capillary: 152 mg/dL — ABNORMAL HIGH (ref 70–99)

## 2011-03-07 LAB — BASIC METABOLIC PANEL
BUN: 13 mg/dL (ref 6–23)
CO2: 26 mEq/L (ref 19–32)
Calcium: 9.3 mg/dL (ref 8.4–10.5)
Chloride: 105 mEq/L (ref 96–112)
Creatinine, Ser: 0.66 mg/dL (ref 0.4–1.2)

## 2011-03-07 LAB — GLUCOSE, CAPILLARY
Glucose-Capillary: 107 mg/dL — ABNORMAL HIGH (ref 70–99)
Glucose-Capillary: 93 mg/dL (ref 70–99)

## 2011-03-08 LAB — GLUCOSE, CAPILLARY
Glucose-Capillary: 120 mg/dL — ABNORMAL HIGH (ref 70–99)
Glucose-Capillary: 45 mg/dL — ABNORMAL LOW (ref 70–99)

## 2011-03-09 ENCOUNTER — Ambulatory Visit (INDEPENDENT_AMBULATORY_CARE_PROVIDER_SITE_OTHER): Payer: 59

## 2011-03-09 DIAGNOSIS — J301 Allergic rhinitis due to pollen: Secondary | ICD-10-CM

## 2011-03-09 LAB — DIFFERENTIAL
Basophils Absolute: 0 10*3/uL (ref 0.0–0.1)
Lymphocytes Relative: 21 % (ref 12–46)
Monocytes Absolute: 0.5 10*3/uL (ref 0.1–1.0)
Neutro Abs: 5.5 10*3/uL (ref 1.7–7.7)

## 2011-03-09 LAB — COMPREHENSIVE METABOLIC PANEL
Albumin: 3.6 g/dL (ref 3.5–5.2)
BUN: 18 mg/dL (ref 6–23)
Calcium: 9.2 mg/dL (ref 8.4–10.5)
Chloride: 102 mEq/L (ref 96–112)
Creatinine, Ser: 0.85 mg/dL (ref 0.4–1.2)
Total Bilirubin: 0.6 mg/dL (ref 0.3–1.2)

## 2011-03-09 LAB — CBC
HCT: 34.8 % — ABNORMAL LOW (ref 36.0–46.0)
MCHC: 32.7 g/dL (ref 30.0–36.0)
MCV: 82.5 fL (ref 78.0–100.0)
Platelets: 280 10*3/uL (ref 150–400)
RDW: 15.6 % — ABNORMAL HIGH (ref 11.5–15.5)
WBC: 7.7 10*3/uL (ref 4.0–10.5)

## 2011-03-09 LAB — URINALYSIS, ROUTINE W REFLEX MICROSCOPIC
Bilirubin Urine: NEGATIVE
Glucose, UA: NEGATIVE mg/dL
Hgb urine dipstick: NEGATIVE
Ketones, ur: NEGATIVE mg/dL
Protein, ur: NEGATIVE mg/dL

## 2011-03-09 LAB — POCT CARDIAC MARKERS
CKMB, poc: 1.2 ng/mL (ref 1.0–8.0)
Troponin i, poc: <0.05 ng/mL (ref 0.00–0.09)

## 2011-03-11 LAB — BASIC METABOLIC PANEL
BUN: 13 mg/dL (ref 6–23)
Calcium: 9.2 mg/dL (ref 8.4–10.5)
GFR calc non Af Amer: >60 mL/min (ref >60)
Glucose, Bld: 124 mg/dL — ABNORMAL HIGH (ref 70–99)
Sodium: 139 mEq/L (ref 135–145)

## 2011-03-16 LAB — GLUCOSE, CAPILLARY: Glucose-Capillary: 238 mg/dL — ABNORMAL HIGH (ref 70–99)

## 2011-03-17 ENCOUNTER — Ambulatory Visit (INDEPENDENT_AMBULATORY_CARE_PROVIDER_SITE_OTHER): Payer: 59

## 2011-03-17 DIAGNOSIS — J309 Allergic rhinitis, unspecified: Secondary | ICD-10-CM

## 2011-03-22 ENCOUNTER — Other Ambulatory Visit: Payer: Self-pay | Admitting: Internal Medicine

## 2011-03-23 ENCOUNTER — Telehealth: Payer: Self-pay | Admitting: Internal Medicine

## 2011-03-23 ENCOUNTER — Other Ambulatory Visit: Payer: Self-pay | Admitting: Internal Medicine

## 2011-03-23 NOTE — Telephone Encounter (Signed)
[facility] Triage Call Report Triage Record Num: [id number] Operator: Remonia Richter Patient Name: [name] Call Date & Time: 03/22/2011 4:41:22PM Patient Phone: [phone] PCP: Illene Regulus Patient Gender: Female PCP Fax : [phone] Patient DOB: 13-Jun-1946 Practice Name: [facility] Reason for Call: [place], , calling regarding a requested refill on Lantus,patient out and no refills to script, one refill given per office protocol, take as directed, NR, given as order per request Protocol(s) Used: Medication Question Calls, No Triage (Adults) Recommended Outcome per Protocol: Provide Information or Advice Only Reason for Outcome: Caller requesting a refill, no triage required and triager able to refill per unit policy Care Advice: ~ 03/22/2011 4:47:24PM Page 1 of 1 [email or web address]

## 2011-03-24 ENCOUNTER — Ambulatory Visit (INDEPENDENT_AMBULATORY_CARE_PROVIDER_SITE_OTHER): Payer: 59

## 2011-03-24 DIAGNOSIS — J309 Allergic rhinitis, unspecified: Secondary | ICD-10-CM

## 2011-03-25 ENCOUNTER — Telehealth: Payer: Self-pay | Admitting: Internal Medicine

## 2011-03-25 NOTE — Telephone Encounter (Signed)
Spoke with pt-having drainage in back of throat and sinus issues/headaches. Pt states that CDY gives her a Rx for this with additional refills as she has these issues quite often. Pt is aware that CDY will be back in the office tomorrow morning and will address this message then.   CDY, please advise if okay to refill and if any additional refills for pt. Thanks.

## 2011-03-26 MED ORDER — DOXYCYCLINE HYCLATE 100 MG PO TABS: ORAL_TABLET | ORAL | Status: DC

## 2011-03-26 NOTE — Telephone Encounter (Signed)
Pt aware of Doxy refill and will contact her pharmacy to be sure it is ready for pick up. She will call if having any other problems.

## 2011-03-26 NOTE — Telephone Encounter (Signed)
Per CDY, ok rx doxycycline 100mg  #8 2 today then 1 daily ref x 5

## 2011-03-30 ENCOUNTER — Ambulatory Visit (INDEPENDENT_AMBULATORY_CARE_PROVIDER_SITE_OTHER): Payer: 59

## 2011-03-30 DIAGNOSIS — J309 Allergic rhinitis, unspecified: Secondary | ICD-10-CM

## 2011-04-05 ENCOUNTER — Other Ambulatory Visit: Payer: Self-pay | Admitting: Internal Medicine

## 2011-04-06 ENCOUNTER — Other Ambulatory Visit: Payer: Self-pay | Admitting: *Deleted

## 2011-04-06 MED ORDER — INSULIN PEN NEEDLE 30G X 8 MM MISC: 1.0000 | Freq: Every day | Status: DC

## 2011-04-07 ENCOUNTER — Other Ambulatory Visit: Payer: Self-pay | Admitting: Internal Medicine

## 2011-04-14 NOTE — Op Note (Signed)
NAMEMAYELIN, [name]              ACCOUNT NO.:  [id number]   MEDICAL RECORD NO.:  [id number]          PATIENT TYPE:  AMB   LOCATION:  DSC                          FACILITY:  [facility]   PHYSICIAN:  Cindee Salt, M.D.       DATE OF BIRTH:  [birth year]   DATE OF PROCEDURE:  07/23/2009  DATE OF DISCHARGE:                               OPERATIVE REPORT   PREOPERATIVE DIAGNOSES:  1. Carpal tunnel syndrome, right hand.  2. Cyst proximal interphalangeal joint, right middle finger.   POSTOPERATIVE DIAGNOSES:  1. Carpal tunnel syndrome, right hand.  2. Cyst proximal interphalangeal joint, right middle finger.   OPERATIONS:  1. Excision of cyst, debridement, synovectomy, proximal      interphalangeal joint, right middle finger.  2. Carpal tunnel release, right hand.   SURGEON:  Cindee Salt, MD   ASSISTANT:  Carolyne Fiscal, RN   ANESTHESIA:  Forearm-based IV regional.   ANESTHESIOLOGIST:  Bedelia Person, MD   HISTORY:  The patient is a 65 year old female with a history of carpal  tunnel syndrome, EMG and nerve conductions positive.  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  She has developed a  large cyst over the PIP joint of her right middle finger with  degenerative changes in the joint beneath.  She is desirous of release  of the carpal tunnel, excision of the cyst with debridement of the PIP  joint of her right middle finger.  She is aware of risks and  complications including infection; recurrence; injury to arteries,  nerves, tendons; incomplete relief of symptoms; dystrophy.  In  preoperative area, the patient is seen, the extremity marked by both the  patient and surgeon, antibiotic given.   PROCEDURE:  The patient was brought to the operating room where a  forearm-based IV regional anesthetic was carried out without difficulty.  She was prepped using ChloraPrep in supine position with the right arm  free.  A time-out was taken confirming the patient, procedure, and a 3-  minute dry time allowed.  Anesthesia was given by Dr. Gypsy Balsam.  After  adequate anesthesia was afforded to the patient, a longitudinal incision  was made in the palm carried down through the subcutaneous tissue.  Bleeders were electrocauterized.  Palmar fascia was split.  Superficial  palmar arch identified.  The flexor tendon to the ring little finger  identified.  To the ulnar side of median nerve, a carpal retinaculum was  incised with sharp dissection.  A right-angle and Sewall retractor were  placed between the skin and forearm fascia.  The fascia released for  approximately a 1.5 cm proximal to the wrist crease under direct vision.  Canal was explored.  A persistent median artery was present.  Area  compression to the nerve was apparent.  No further lesions were  identified.  The wound was irrigated with saline and closed with  interrupted 5-0 Vicryl Rapide sutures.  A separate incision was then  made over the PIP joint.  This was curvilinear in nature, carried down  through the subcutaneous tissue, and multiloculated cyst protruding  through the extensor  tendon was noted.  With blunt and sharp dissection,  this was dissected free.  The opening in the extensor tendon was between  the lateral band and central slip, this area was opened.  The bone was  debrided of osteophytes.  A synovectomy performed.  The specimen was  sent to pathology.  The wound was copiously irrigated with saline.  The  extensor tendon was closed with running 5-0 Mersilene suture.  After  irrigation, the skin was then closed with interrupted 5-0 Vicryl Rapide  sutures.  A sterile compressive dressing to the finger along with  splint.  A sterile compressive dressing and splint to the wrist was  applied with the metacarpophalangeal joints with remaining fingers free.  On deflation of the tourniquet, all fingers immediately pinked.  She was  taken to the recovery room for observation in satisfactory condition.   Specimen was sent to pathology.  The patient will be discharged home, to  return to the [facility] [place] in 1 week, on [place] [facility].           ______________________________  Cindee Salt, M.D.     GK/MEDQ  D:  07/23/2009  T:  07/24/2009  Job:  [id number]   cc:   Rosalyn Gess. Norins, MD

## 2011-04-14 NOTE — Assessment & Plan Note (Signed)
HEALTHCARE                             PULMONARY OFFICE NOTE   [name]                     MRN:          [id number]  DATE:09/14/2007                            DOB:          November 29, 1946    CHIEF COMPLAINT:  Chronic cough.   HISTORY OF PRESENT ILLNESS:  [name] is a pleasant 65 year old  woman with cough since July 07, 2007.  She states that her cough  started a week after starting Lisinopril for hypertension and Actos for  diabetes.  Initially this cough was diagnosed as pneumonia based on her  chest x-ray but she denies any headaches or fevers or colored sputum at  that time.  Nevertheless she was started with cephalexin and then she  has had a second course of antibiotics with Z-Pak and then 5 more  courses of antibiotics.  Still the cough persists.  It is a dry cough.  She feels like early morning there is some mucus in her throat that she  is unable to bring out.  Other than that, the cough is present day and  night and particularly at night.  She wakes up every hour.  She is known  to have chronic postnasal drainage but she says there is no change in  this and the cough is temporally related to starting Lisinopril and  Actos.  No fever.  No hemoptysis.  No edema.  She did go to the  emergency room for the cough and has been subjected to a TB test and a  whooping cough test, the results of these are pending.   PAST MEDICAL HISTORY:  1. Hypertension.  2. Asthma.  She states that prior to her cough starting her albuterol      need was approximately 1 time per day.  Since August 2008, her      cough has now forced her to take albuterol 2-3 times daily.  She      has had asthma since age 1.  She has never had PFTs.  Symptoms of      occasional wheezing.  3. Allergies and sinus trouble.  She is being followed by Dr. Corinda Gubler      for this.  She has had vaccines in the past.  She is also treated      with prednisone and  antibiotics.  4. Pneumonia, not otherwise specified years ago.  5. Postnasal drip.  She quit taking her nasal steroid inhaler.  6. Negative PPD every year at prior employment.  7. Diabetes.   ALLERGIES:  1. MILK.  2. INDOOR AND OUTDOOR POLLENS.   CURRENT MEDICATIONS:  Metformin, Lisinopril, Lasix, Simvastatin,  Protonix, Paxil, Levothyroxine, Lantus, Actos, I-Caps, vitamin C, and  calcium, and Combivent.   SOCIAL HISTORY:  She is separated.  She has children.  She smoked 3  packs a day for 30 years and quit smoking 9 years ago in 1999.  She is  on disability.  No recent travel.   FAMILY HISTORY:  Father had emphysema, also had allergies.  Somebody had  heart disease, clotting disorders.  Her father also  had lung cancer and  sister had breast cancer.   REVIEW OF SYSTEMS:  She has had postnasal drip for many years for which  she is not treating herself properly.  She also has asthma which is  characterized by a baseline cough and wheeze, shortness of breath with  exertion.  She does have trigger factors like smells, like perfume, also  milk and chocolate, indoor and outdoor allergens.  She has been followed  by Dr. Corinda Gubler, last visit was a year ago.  Review of systems otherwise  positive as in HPI and as documented in the questionnaire.   PHYSICAL EXAMINATION:  VITAL SIGNS:  Height 5 feet 3 inches, weight 254  [name], temperature 98.5, blood pressure 108/64, pulse is 72, saturation  97% on room air.  GENERAL:  Obese female seated comfortably.  HEENT:  Mallampati class III-IV.  CENTRAL NERVOUS SYSTEM:  Alert and oriented x3.  Speech and ambulation  are normal.  RESPIRATORY:  Clear to auscultation both sides.  CARDIOVASCULAR:  Normal heart sounds.  No murmurs.  ABDOMEN:  Obese.  EXTREMITIES:  No cyanosis.  No clubbing.  No edema.  SKIN:  Intact.  MUSCULOSKELETAL:  Joints are normal.   LABORATORY EVALUATION:  PPD test most recently negative by her history.  Whooping cough  test, results pending.   IMPRESSION:  Chronic cough.   She has a cough that started in August 2008, after starting Lisinopril  and Actos.  I definitely think cough is related to Lisinopril.  In  addition, underlying postnasal drip and possible chronic obstructive  pulmonary disease from smoking may be causing cough.  I do not know the  roll of Actos causing cough but the patient brought it to my attention.  I have recommended she stop Lisinopril immediately.  We will add this  medication to her allergy list.  I have started her on Norvasc 5 mg once  daily.  This will be followed up by Dr. Debby Bud on November 19, 2007.  I  will see her on followup with full pulmonary function tests and at that  time we will address residual symptoms from COPD and postnasal drip and  treatment for those.   HEALTH MAINTENANCE:  She had a flu shot on September 13, 2007.   RETURN TO CLINIC:  After pulmonary function tests.     Kalman Shan, MD  Electronically Signed    MR/MedQ  DD: 09/19/2007  DT: 09/19/2007  Job #: [id number]   cc:   Isabella Gess. Norins, MD

## 2011-04-14 NOTE — Assessment & Plan Note (Signed)
[facility]                             PULMONARY OFFICE NOTE   KASHMIR, LEEDY                     MRN:          [id number]  DATE:10/18/2007                            DOB:          Sep 03, 1946    HISTORY OF PRESENT ILLNESS:  Patient is a 65 year old female patient of  Dr. Jane Canary who was recently seen for pulmonary consult one month  ago for chronic cough.  Patient was felt to have an ACE inhibitor-  related cough and was changed over from lisinopril to Norvasc.  The  patient underwent PFTs which showed an FEV1 of 1.59 liters, which was  79% of the predicted and an FEV1/FAVC ratio of 71%.  Patient did not  have any significant response to bronchodilators.  Spiriva was added to  her regimen two weeks ago.  Patient returns, reporting that cough did  improve but did not totally resolve.  She continues to have persistent  cough that is what she describes as a dry, hacking cough.  Patient has  had an episode of some mild green sputum with some blood tinge  yesterday.  She denies any chest pain, orthopnea, PND, or leg swelling.  Patient does complain of bilateral back and rib pain.   PAST MEDICAL HISTORY:  Reviewed.   CURRENT MEDICATIONS:  Reviewed.   PHYSICAL EXAMINATION:  Patient is an obese female in no acute distress.  She is afebrile with stable vital signs.  O2 saturation is 97% on room  air.  HEENT:  Nasal mucosa with some mild erythema.  Nontender sinuses.  Posterior pharynx is clear.  NECK:  Supple without cervical adenopathy.  No JVD.  LUNGS:  The lung sounds reveal coarse rhonchi bilaterally with a few  expiratory wheezes.  CARDIAC:  Regular rate and rhythm.  ABDOMEN:  Soft, obese, and nontender.  EXTREMITIES:  Warm without any edema.   DATA:  Chest x-ray reveals no acute infiltrates.   IMPRESSION/PLAN:  Chronic cough with upper airway instability.  Patient  is recommended to use Mucinex DM twice daily.  A short prednisone  taper  over the next four days.  Xyzal 5 mg at bedtime for nasal drainage.  Tessalon Perles up to three times a day as needed for cough  control.  May use Tussionex 1/2 to 1 teaspoon every 12 hours as needed  for severe cough.  Patient is to return back to the office in two weeks  with Dr. Marchelle Gearing or sooner if needed.      Rubye Oaks, NP  Electronically Signed      Kalman Shan, MD  Electronically Signed   TP/MedQ  DD: 10/18/2007  DT: 10/18/2007  Job #: [id number]

## 2011-04-14 NOTE — Assessment & Plan Note (Signed)
[facility]                             PULMONARY OFFICE NOTE   KIP, KAUTZMAN                     MRN:          [id number]  DATE:10/03/2007                            DOB:          26-Jun-1946    CHIEF COMPLAINT:  Follow up chronic cough.   HISTORY OF PRESENT ILLNESS:  I saw [name] for the first time  on September 14, 2007, for chronic cough that likely was related to  starting Lisinopril.  I advised her to stop the Lisinopril since then.  One week after stopping lisinopril  she said cough subsided. Now it is  98% resolved.  She still has some mild hacking cough that is present  occasionally.  It is predominantly dry.  She feels like she has a sore  throat when she swallows, whenever she has this cough.  The new problem  is that for the past week or so she has had on and off at random some  mild right infra-axillary chest pain that is fleeting and transient and  she thinks this is related to her cough.  It can occur when she is  sitting or lying down.  Otherwise she is asymptomatic.  She has quit  taking her Zyrtec.  She is taking Astelin and Nasacort for post-nasal  drip.  There are no other problems.   PAST MEDICAL HISTORY:  Reviewed as of September 14, 2007.  No changes.   ALLERGIES:  MILK, INDOOR AND OUTDOOR POLLENS. and LISINOPRIL (cough)   CURRENT MEDICATIONS:  This was again reviewed at the last visit on  September 14, 2007.  Since then she has quit taking Zyrtec and Zestril.  The only thing to note is that she is taking Nasacort and Astelin for  over one year, which I did not mention the last time.  Started on  Norvasc at the last visit on September 14, 2007.   SOCIAL HISTORY/FAMILY HISTORY/REVIEW OF SYSTEMS:  All unchanged from  last visit but cough has improved.   PHYSICAL EXAMINATION:  VITAL SIGNS:  Weight 261 pounds, temperature 97.8  degrees, blood pressure 146/84, pulse 74, saturation 97% on room air.  GENERAL:  An  obese female, seated comfortably.  HEENT:  Throat looks normal.  Mallampati class 3.  CNS:  Alert and oriented x3.  Speech and ambulation normal.  LUNGS:  Clear to auscultation on both sides.  CARDIOVASCULAR:  Normal heart sounds.  ABDOMEN:  Obese.  EXTREMITIES:  No clubbing, cyanosis or edema.  SKIN:  Intact.  MUSCULOSKELETAL:  Joints are normal.   LABORATORY DATA:  Bedside spirometry today shows an FEV-I of 1.13  liters, 49% predicted; FVC of 1.68 liters, 59% predicted; FEV-I to FVC  ratio of 61, compared to a predicted of 84.  In essence, this shows  obstruction that is consistent with GOLD stage 3 chronic obstructive  pulmonary disease.   ASSESSMENT/PLAN:  1. Cough, related to Lisinopril:  This is largely resolved after      stopping Lisinopril.  She should never take Lisinopril again.  She      is taking Norvasc for  blood pressure.  She will follow this with      Dr. Rosalyn Gess. Norins.   1. Residual cough:  She has some mild hacking cough and mild shortness      of breath.  Office spirometry shows obstruction, that given her      smoking history, is consistent with a history of  GOLD stage 3      chronic obstructive pulmonary disease. Obstruction could also      represent asthma because this is a prior diagnosis outside.      Currently clinical status is stable. Recommended Spiriva one puff      daily.  She has already had a flu shot and a Pneumovax vaccine.  I      will see her again in two to three months.  At that time I will      review her symptoms and consider a referral to pulmonary      rehabilitation.  I will also get full pulmonary function tests with      bronchodilator response and treat the asthma component with inhaled      steroids if needed.   1. Right lower chest pain:  I think this is musculoskeletal.  I have      advised her to take Tylenol p.r.n.   FOLLOWUP:  A return to the clinic in two to three months.     Kalman Shan, MD  Electronically  Signed    MR/MedQ  DD: 10/03/2007  DT: 10/04/2007  Job #: [id number]

## 2011-04-14 NOTE — Discharge Summary (Signed)
[name], [name]              ACCOUNT NO.:  [id number]   MEDICAL RECORD NO.:  [id number]          PATIENT TYPE:  INP   LOCATION:  4738                         FACILITY:  [facility]   PHYSICIAN:  [name], MDDATE OF BIRTH:  [birth year]   DATE OF ADMISSION:  11/13/2008  DATE OF DISCHARGE:  11/16/2008                               DISCHARGE SUMMARY   DIAGNOSES AT THE TIME OF DISCHARGE:  1. Acute chronic obstructive pulmonary disease exacerbation.  2. Bronchitis.  3. Diarrhea.  Stool negative for Clostridium difficile x1.  4. Chronic diastolic heart failure.  5. Depression/anxiety.  6. Hypothyroid.  7. Hyperlipidemia.  8. Diabetes type 2.  9. Mild anemia with borderline depressed B12 level.   HISTORY OF PRESENT ILLNESS:  [name] is a 65 year old white female  admitted on November 13, 2008, with chief complaint of shortness of  breath and wheezing.  She noted that she had been having worsening  shortness of breath with wheezing, which has been present for the last  several days.  She presented to the ED on Sunday and was started on  Avelox and guaifenesin and was asked to follow up with her primary care  doctor.  She continued to have some wheezing at home.  She tried to call  into her Pulmonary Clinic and have the nebulizer treatments called into  home, was unable to get hold in a timely fashion.  She is admitted for  further evaluation and treatment.   COURSE OF HOSPITALIZATION:  1. Acute COPD exacerbation.  The patient was admitted.  She was placed      on IV Solu-Medrol.  Initial chest x-ray question pneumonia,      however, question of left perihilar infiltrate.  Followup chest x-      ray performed on November 10, 2008, was negative.  She will be      continued on Avelox at the time of discharge for bronchitis to      complete a 7-day total course.  We will continue DuoNeb standing at      the time of discharge, a prescription will be given.  In addition,  we have added sliding scale coverage t.i.d. a.c. meals for the      patient's elevated blood sugars in the setting of steroid taper.  2. Diarrhea.  The patient has only had 1 stool today.  Her white count      is normal.  Stool was negative for C. diff x1.  There is one in the      lab since yesterday, for some reason, it has not come back,      however, clinical suspicion for C. diff is low at this point.   MEDICATIONS AT THE TIME OF DISCHARGE:  1. Simvastatin 40 mg one-half tablet p.o. daily.  2. Zetia 10 mg p.o. daily.  3. Metformin 1000 mg p.o. b.i.d.  4. Lasix 40 mg p.o. daily.  5. Aciphex 20 mg p.o. daily.  6. Levoxyl 75 mcg p.o. daily.  7. Diovan 120 mg p.o. daily.  8. Astelin spray once nightly.  9. Veramyst 2 sprays in  the morning.  10.Symbicort 80/4.5 two puffs twice daily.  11.Venlafaxine 37.5 mg 2 tablets p.o. daily.  12.NovoLog sliding scale coverage.  For sugar less than 100, no      NovoLog; 101-150, 2 units; 151-200, 3 units; 201-250, 5 units; 251      300, 7 units; 301-350, 9 units; 351-400, 11 units; greater than      400, call [name].  13.Avelox 400 mg p.o. daily for 3 additional days.  14.Prednisone 60 mg tabs 6 tablets on November 17, 2008, then 50 mg      p.o. daily for 2 days, then 40 mg p.o. daily for 2 days, 30 mg p.o.      daily for 2 days, then 20 mg p.o. daily for 2 days, then 10 mg p.o.      daily for 2 days, then stop.  15.ProAir 2 puffs 4-6 times daily as needed.  16.Actos 30 mg p.o. daily.  17.Amlodipine 5 mg p.o. daily.  18.Trazodone 50 mg p.o. nightly.  19.Calcium carbonate 600/40 p.o. daily.  20.Hydrocodone/acetaminophen 1 tablet p.o. t.i.d. as needed.  21.Nitrostat 0.4 mg sublingual as needed.  22.Multivitamin 1 tablet p.o. daily.  23.DuoNeb every 4-6 hours as needed.   PERTINENT LABORATORY DATA:  At time of discharge, hemoglobin 11.9,  hematocrit 35.3, BUN 19, creatinine 0.64.  B12 281.   DISPOSITION:  She will be discharged to  home.   FOLLOWUP:  She is to follow up with [name] on Tuesday,  November 20, 2008, at 11:10 a.m.  She was instructed to call [name]  should she develop worsening wheezing, sugars greater than 400, or  fever.    Greater than 30 minutes were spent on discharge planning.      Isabella Craze, NP      Isabella Rover. Felicity Coyer, MD  Electronically Signed    MO/MEDQ  D:  11/16/2008  T:  11/17/2008  Job:  [id number]   cc:   Isabella Gess. Norins, MD

## 2011-04-14 NOTE — Consult Note (Signed)
NAMEREBA, HULETT              ACCOUNT NO.:  [id number]   MEDICAL RECORD NO.:  [id number]          PATIENT TYPE:  OBV   LOCATION:  4702                         FACILITY:  [facility]   PHYSICIAN:  Antonietta Breach, M.D.  DATE OF BIRTH:  12/08/1945   DATE OF CONSULTATION:  08/17/2008  DATE OF DISCHARGE:  08/17/2008                                 CONSULTATION   REASON FOR CONSULTATION:  Anxiety, depression.   REQUESTING PHYSICIAN:  Valerie A. Felicity Coyer, MD   HISTORY OF PRESENT ILLNESS:  [name] is a 65 year old female  admitted to the [name] on August 16, 2008 for chest  pain.  She has been undergoing an angina workup.  She has 3 weeks of  depressed mood, poor concentration, anhedonia and decreased energy.  She  has been compulsively scratching herself at times.  This has stopped.  She is not having any thoughts of harming herself or others.  She has no  hallucinations or delusions.   Her orientation and memory function are intact.  She is socially  appropriate and cooperative with care.   Most recently [name] was started on Pristiq 2 months ago.  She has  taken it regularly and there has been no improvement in her symptoms.   PAST PSYCHIATRIC HISTORY:  [name] denies any history of increased  energy, decreased need for sleep.  She has no history of psychiatric  admission.  She does not have any history of suicide attempts.   In review of the past medical record in August 2005 [name] was  diagnosed with delirium.  She was treated with Haldol at that time and  then Geodon which resulted in a clearing of the delirium.  She was  discharged from the general medical hospital on Paxil.   FAMILY PSYCHIATRIC HISTORY:  None known.   SOCIAL HISTORY:  [name] is separated.  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  She lives with her son and daughter-in-law.  She does not use any  alcohol or illegal drugs.  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   PAST  MEDICAL HISTORY:  Coronary artery disease, history of myocardial  infarction, history of congestive heart failure, diabetes mellitus type  2, hypertension and chronic obstructive pulmonary disease,  hypothyroidism.   MEDICATIONS:  MAR is reviewed.  1. Synthroid 75 mcg daily.  2. Effexor 75 mg XR has just been started daily.  3. Xanax 0.5 t.i.d. p.r.n.   ALLERGIES:  CODEINE, DEMEROL, ATIVAN, NOVOCAIN, PENICILLIN, LATEX and  LISINOPRIL.   LABORATORY DATA:  Hemoglobin A1c is 6.8.  Sodium 140, BUN 21, creatinine  1.0, glucose 59.   REVIEW OF SYSTEMS:  CONSTITUTIONAL,  HEAD, EYES, EARS, NOSE AND THROAT,  MOUTH, NEUROLOGIC, PSYCHIATRIC, CARDIOVASCULAR, RESPIRATORY,  GASTROINTESTINAL, GENITOURINARY, MUSCULOSKELETAL, HEMATOLOGIC,  LYMPHATIC, ENDOCRINE, METABOLIC; all unremarkable.   PHYSICAL EXAMINATION:  VITAL SIGNS:  Temperature 97.7, pulse 67,  respiratory rate 20, blood pressure 152/82, O2 saturation on room air  96%.  GENERAL APPEARANCE:  [name] is a middle-aged female sitting up in  her hospital bed with no abnormal involuntary movements.   MENTAL STATUS EXAM:  [name] has good eye contact.  Her attention  span is mildly decreased.  Concentration is mildly decreased.  Her  affect is constricted.  Mood is depressed.  She is oriented to all  spheres.  Memory is intact to immediate, recent and remote.  Fund of  knowledge and intelligence within normal limits.  Speech involves normal  rate and prosody without dysarthria.  Thought process is logical,  coherent, goal-directed.  No looseness of associations.  Thought  content; no thoughts of harming herself, no thoughts of harming others,  no delusions, no hallucinations.  Insight is intact.  Judgment is  intact.   ASSESSMENT:  AXIS I:  1.  293.83 mood disorder not otherwise specified  (idiopathic and general medical factors), depressed.  2.  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  3.  293.84 anxiety disorder  not  otherwise specified.  AXIS II:  None.  AXIS III:  See past medical history.  AXIS IV:  General medical.  AXIS V:  55.   [name] is not at risk to harm herself or others.  She agrees to  call emergency services immediately for any thoughts of harming herself  or other psychiatric emergency symptoms.   The undersigned provided ego supportive psychotherapy and education.   The indications, alternatives and adverse effects of Effexor were  discussed with the patient for antidepression, antianxiety including the  risk of raising her blood pressure.  She understands and would like to  proceed with Effexor.   RECOMMENDATIONS:  1. Would increase Effexor by 37.5 mg every 4 days while monitoring her      blood pressure to the initial trial dose of 75 mg b.i.d.  The      undersigned will prescribe Effexor 37.5 mg, dispense #60 two p.o.      q.a.m. one refill.  Her medication will then be adjusted under the      supervision of a psychiatrist.  2. The case manager is arranging outpatient psychiatric care.  This      dictation will be sent to the psychiatrist.  3. Outpatient psychotherapy including cognitive behavioral therapy      with deep breathing and progressive muscle relaxation training.  4. Would continue Xanax 0.25 mg p.o. q.i.d. p.r.n. anti-acute anxiety      with the goal of eventually discontinuing the Xanax with the      Effexor and psychotherapy.  5. Would discontinue the Pristiq.     Antonietta Breach, M.D.  Electronically Signed    JW/MEDQ  D:  08/17/2008  T:  08/18/2008  Job:  [id number]

## 2011-04-14 NOTE — H&P (Signed)
[name], [name]              ACCOUNT NO.:  [id number]   MEDICAL RECORD NO.:  [id number]          PATIENT TYPE:  INP   LOCATION:  1832                         FACILITY:  [facility]   PHYSICIAN:  [name], MDDATE OF BIRTH:  September 03, 1946   DATE OF ADMISSION:  11/13/2008  DATE OF DISCHARGE:                              HISTORY & PHYSICAL   PRIMARY CARE [name]:  [name], M.D. with [facility].   CHIEF COMPLAINT:  Shortness of breath and wheezing.   The patient is a 65 year old female with history of asthma and COPD  followed by Dr. Debby Bud and also by Dr. Marchelle Gearing with pulmonary.  The  patient for the past few days has been having worsening shortness of  breath and wheezing.  She presented to the emergency department on  Sunday for the above-mentioned complaints, was started on Avelox and  guaifenesin and asked to follow up with her primary care doctor.  She  continued to have some wheezing at home.  She tried to call in to  pulmonary clinic to have some nebulizer treatments called in to home but  was unable to get hold of them in a timely fashion and her shortness of  breath progressed.  She presented back to the emergency department where  a chest x-ray showed maybe now developing left perihilar infiltrate at  which point [facility] was called for admission for [name].  Otherwise, no  chest pain but shortness of breath and wheezing is relieved by nebulizer  treatments.  No lower extremity swelling.  No fevers.  The patient does  endorse diarrhea that has been going on for a few days now and seems to  be mucousy in nature.  The patient has persistent cough.  Otherwise,  review of systems unremarkable.   PAST MEDICAL HISTORY:  1. Significant for COPD.  2. An asthma.  3. Diabetes.  4. Hypertension.  5. Hypothyroidism.  6. Depression.  7. Anxiety.  8. History of chronic diastolic heart failure, well compensated      usually.   SOCIAL HISTORY:  The patient used to  smoke but quit 15 years ago.  Does  not drink.  Lives at home.  Has her daughter-in-law present.   FAMILY HISTORY:  Significant for coronary artery disease and cancer.  Otherwise unremarkable.   ALLERGIES:  TO CODEINE, DEMEROL AND LATEX.   MEDICATIONS:  1. Glucophage 1000 mg twice a day.  2. Lantus 50 units once a day.  3. Lisinopril 50 mg daily.  4. Actos 40 mg daily.  5. Zocor 20 mg daily.  6. Lasix 40 mg daily.  7. AcipHex 20 mg daily.  8. Levoxyl 75 mcg daily.   Multivitamin, vitamin C, Nasacort, azelastine as needed.  The patient  does not take Spiriva or Xanax.  She started to take some sort of  antidepression medication instead of Paxil, I am guessing maybe Effexor,  but I am not sure, she does not have dose with her but will need to  bring this tomorrow.   VITAL SIGNS:  Temperature 100, blood pressure 140/82, pulse 86,  respirations 18, sating 94%  on 2 liters.  40.  The patient appears to be  in no acute distress but gets short-winded when talks.  LUNGS:  Diffuse wheezes bilaterally but moving air.  HEART:  Regular rate and rhythm.  No murmurs appreciated.  ABDOMEN:  Obese, nondistended.  There is a mild generalized tenderness  noted.  LOWER EXTREMITIES: Without clubbing or cyanosis or edema.  Strength 5/5  in all 4 extremities.  CRANIAL NERVES:  Intact.  Of note, the patient has history of a lazy  eye.   LABS:  White blood cell count 6, hemoglobin 11.6.  sodium 141, potassium  3.6, creatinine 1.  Chest x-ray showing developing left perihilar  infiltrate.  BNP of 60.  EKG showing sinus rhythm at rate of 83.  There  is no evidence of ischemic changes and overall no changes from prior.   ASSESSMENT AND PLAN:  This is a 65 year old female with history of  chronic obstructive pulmonary disease and asthma and diabetes who  presents with worsening shortness of breath.  No fevers at home, though  she has persistent cough.   1. Shortness of breath, likely chronic  obstructive pulmonary disease      exacerbation:  Will make sure the patient is on IV steroids as she      is still very wheezy.  Continue with albuterol nebs p.r.n. and      scheduled Atrovent.  Continue Symbicort.  Make sure she is on      Avelox for possible pneumonia.  At this point does not appear to be      in respiratory distress but if something changes will obtain ABG.      Other etiologies less likely, the patient does not endorse any      chest pain.  She has known history of COPD and has symptoms that      are most consistent with COPD exacerbation, does not have known      risk factors for PE.  2. Possibly early pneumonia:  Will cover with Avelox for now as this      should have good community-acquired coverage.  3. Diarrhea:  We will obtain stool studies.  The patient appears to be      euvolemic at this point with full orthostatics.  4. History of diastolic dysfunction.:  Continue Lasix, currently      euvolemic.  5. History of diabetes:  Will do sliding scale, hold Actos and      metformin, continue Lantus.  6. Depression:  Will for now make sure she is on Effexor but will need      to figure out which medication she actually is supposed to be on,      it sounds like she was on Effexor at home.  7. Hypertension:  Continue lisinopril.  8. Hypothyroidism:  Continue Levoxyl, check TSH.  9. Prophylaxis:  Protonix and Lovenox.   The patient wished to be DNR/DNI as discussed with her and her family.      Isabella Cowboy, MD  Electronically Signed     AVD/MEDQ  D:  11/13/2008  T:  11/13/2008  Job:  [id number]   cc:   Isabella Gess. Norins, MD

## 2011-04-14 NOTE — Assessment & Plan Note (Signed)
[facility]                             PULMONARY OFFICE NOTE   [name], DEFINO                     MRN:          [id number]  DATE:10/20/2007                            DOB:          1946-11-11    HISTORY OF PRESENT ILLNESS:  Patient is a 65 year old white female  patient of Dr. Marchelle Gearing who has been recently seen for a pulmonary  consult one month ago for a chronic cough.  She was felt to have an ACE  inhibitor-related cough and was changed over from lisinopril to Norvasc.  The patient was seen two days ago for persistent cough with upper airway  instability.  She was placed on Mucinex DM twice daily, prednisone  taper, and added in Xyzal 5 mg at bedtime for any postnasal drip  symptoms.  She was also given Tessalon Perles to be used as needed for  cough control and Tussionex.  Patient returns back today reporting that  the symptoms have not improved.  Now she is having a productive cough  with thick green sputum, nasal congestion, and wheezing.  She denies any  hemoptysis, orthopnea, PND, or leg swelling.   PAST MEDICAL HISTORY:  Reviewed.   CURRENT MEDICATIONS:  Reviewed.   PHYSICAL EXAMINATION:  Patient is a female in no acute distress.  She is afebrile with stable vital signs.  O2 saturation is 95% on room  air.  HEENT:  Nasal mucosa is erythematous.  Nontender sinuses.  Posterior  pharynx is clear.  NECK:  Supple without cervical adenopathy.  No JVD.  LUNGS:  The lung sounds reveal coarse breath sounds bilaterally with  some upper airway pseudowheezing.  CARDIAC:  Regular rate.  ABDOMEN:  Soft, nontender.  EXTREMITIES:  Warm without any edema.   IMPRESSION/PLAN:  Cyclical cough with upper airway instability, possibly  secondary to sinusitis.  Patient will begin Augmentin x10 days.  CT of  the sinuses is pending.  Patient will finish prednisone as recommended.  Continue on Mucinex DM, Tessalon Perles, and Tussionex as needed for  cough control.  We will add in Pepcid 20 mg at bedtime for any reflux  symptoms  that could be irritating the airways.  The patient is to return back to  see Dr. Marchelle Gearing as scheduled in one week or sooner as needed.     Rubye Oaks, NP  Electronically Signed      Kalman Shan, MD  Electronically Signed   TP/MedQ  DD: 10/21/2007  DT: 10/21/2007  Job #: [id number]

## 2011-04-14 NOTE — Op Note (Signed)
[name], [name]              ACCOUNT NO.:  [id number]   MEDICAL RECORD NO.:  [id number]          PATIENT TYPE:  AMB   LOCATION:  DSC                          FACILITY:  [facility]   PHYSICIAN:  Cindee Salt, M.D.       DATE OF BIRTH:  1946-07-18   DATE OF PROCEDURE:  03/05/2009  DATE OF DISCHARGE:                               OPERATIVE REPORT   PREOPERATIVE DIAGNOSIS:  Carpal tunnel syndrome left hand.   POSTOPERATIVE DIAGNOSIS:  Carpal tunnel syndrome left hand.   OPERATION:  Decompression left median nerve.   SURGEON:  Cindee Salt, MD   ASSISTANT:  Joaquin Courts, RN   ANESTHESIA:  Forearm based IV regional with local infiltration.   ANESTHESIOLOGIST:  Maren Beach, MD   HISTORY:  The patient is a 65 year old female with a history of carpal  tunnel syndrome, EMG nerve conduction is positive.  This has not  responded to conservative treatment.  She has elected to undergo  decompression of the median nerve.  Pre, peri and postoperative course  have been discussed along with risks and complications.  She is aware  that there is no guarantee with surgery, possibility of infection,  recurrence injury to arteries, nerves, tendons complete relief of  symptoms, and dystrophy.  Preoperative area, the patient is seen.  The  extremity marked by both the patient and surgeon.  Antibiotic given.   PROCEDURE:  The patient is brought to the operating room where a forearm  based IV regional anesthetic was carried out without difficulty under  the direction of Dr. Katrinka Blazing.  She was prepped using ChloraPrep, supine  position, left arm free.  A time-out was taken confirming the patient  and procedure.  A longitudinal incision was made in the palm and carried  down through the subcutaneous tissue.  Bleeders were electrocauterized.  The palmar fascia was split.  Superficial palmar arch identified.  The  flexor tendon to the ring and little finger identified.  To the ulnar  side of the median  nerve, the carpal retinaculum was incised with sharp  dissection.  Right angle and Sewall retractor were placed between skin  and forearm fascia.  The fascia was released for approximately a  centimeter and half proximal to the wrist crease under direct vision.  Canal was explored.  The area of compression to the nerve was apparent.  No further lesions were identified.  Persistent median artery was  present.  The wound was copiously irrigated with saline.  The skin then  closed with interrupted 5-0 Vicryl Rapide sutures.  Local infiltration  was given with 0.25% Marcaine without epinephrine.  Sterile compressive  dressing splint to the wrist was applied with fingers  free.  The patient tolerated the procedure well.  On deflation of the  tourniquet, all fingers immediately pinked.  She was taken to the  recovery room for observation in satisfactory condition.  She will be  discharged home to return to the [facility] [place] [facility] in 1 week on  [place].           ______________________________  Cindee Salt, M.D.  GK/MEDQ  D:  03/05/2009  T:  03/06/2009  Job:  [id number]   cc:   Rosalyn Gess. Norins, MD

## 2011-04-14 NOTE — Discharge Summary (Signed)
[name], [name]              ACCOUNT NO.:  [id number]   MEDICAL RECORD NO.:  [id number]          PATIENT TYPE:  OBV   LOCATION:  4702                         FACILITY:  [facility]   PHYSICIAN:  Rosalyn Gess. Norins, MD  DATE OF BIRTH:  1946/07/28   DATE OF ADMISSION:  08/16/2008  DATE OF DISCHARGE:  08/17/2008                               DISCHARGE SUMMARY   ADMITTING DIAGNOSES:  1. Chest pain, atypical rule out myocardial infarction.  2. Anxiety and depression.  3. Diabetes.  4. Hypothyroid disease.  5. Hyperlipidemia.  6. Chronic diastolic heart failure, well compensated at admission.   DISCHARGE DIAGNOSES:  1. Chest pain, atypical, myocardial infarction ruled out.  2. Anxiety and depression.  3. Diabetes.  4. Hypothyroid disease.  5. Hyperlipidemia.  6. Chronic diastolic heart failure, well compensated at admission.   CONSULTANTS:  None.   PROCEDURES:  None.   HISTORY OF PRESENT ILLNESS:  The patient is a well-known patient to the  practice followed for multiple medical problems with multiple cardiac  risk factors including her diabetes, hyperlipidemia, hypertension, and  obesity.  The patient presented with a 2-day history of intermittent  left-sided chest pain with radiation to her left shoulder.  She reported  the pain was worse on the morning of admission associated with nausea,  shortness of breath, and diaphoresis.  The patient described the pain as  sharp shooting pain, it is worse with movement, deep inspiration.  The  patient also has had a history of anxiety and depression with frequent  crying.  The patient had several scratch marks on her left forearm,  which are from anxiety and self-inflicted scratching.  Please see the  admission note for further details as well as physical exam.   HOSPITAL COURSE:  The patient was admitted to a telemetry floor.  She  had cardiac enzymes cycled x3 with CK of 128, 120, 115, troponin Is of  0.01, 0.02, and 0.02.  Telemetry  was unremarkable.  The patient did have  a chest x-ray at the time of admission, which was unremarkable with no  acute cardiopulmonary disease or abnormality.  Other laboratory ordered,  but pending at the time of discharge.  The patient has hemoglobin A1c  and thyroid-stimulating hormone.  With the patient having ruled out for  MI, she is stable and ready for discharge home with outpatient followup.  She does remain stable with no signs of active congestive failure.   PHYSICAL EXAMINATION:  Temperature of 97.7, blood pressure 152/82, heart  rate 67, respirations 20, O2 sats 96% on room air.  Chest was clear with  no rales, wheezes, or rhonchi.  Cardiovascular, 2+ radial pulse.  Her  precordium was quiet.  She had a regular rate and rhythm.  Abdomen was  markedly obese hindering exam.   DISCHARGE MEDICATIONS:  This patient will resume all of her home  medications including:  1. Xanax 0.25 mg q.6 p.r.n.  2. Xyzal 5 mg daily.  3. Vicodin 5/500 q.6 p.r.n.  4. Albuterol inhaler 2 puffs q.i.d. p.r.n.  5. Amlodipine 5 mg daily.  6. Pristiq 50 mg  daily.  7. Simvastatin 20 mg daily.  8. Tylenol 1-2 extra strength tablets as needed up to 6 tablets daily.  9. NovoLog insulin 10 as instructed.  10.Metformin 1000 mg b.i.d.  11.Lasix 40 mg daily.  12.Aciphex 20 mg daily.  13.Levoxyl 75 mcg daily.  14.Lantus 50 units nightly.  15.Actos 30 mg daily.  16.Multiple supplements including vitamin C calcium.  17.Veramyst 27.5 mcg 2 puffs to each nostril daily.  18.Astelin nasal spray once spray to each nostril q.h.s.  19.Symbicort 80/4.5 two puffs b.i.d..   DISPOSITION:  The patient is discharged home.  She is scheduled for an  adenosine Myoview stress study on Tuesday, August 21, 2008, at 11:45  a.m., to arrive at 11:15 at [facility]  The patient is to see  Dr. Debby Bud either Thursday or Friday of next week and the office will  call with appointment time for her.   The patient's  condition at the time of discharge dictation is stable.      Rosalyn Gess Norins, MD  Electronically Signed     MEN/MEDQ  D:  08/17/2008  T:  08/17/2008  Job:  [id number]

## 2011-04-15 ENCOUNTER — Other Ambulatory Visit: Payer: Self-pay | Admitting: Internal Medicine

## 2011-04-16 ENCOUNTER — Other Ambulatory Visit: Payer: Self-pay | Admitting: Internal Medicine

## 2011-04-16 ENCOUNTER — Ambulatory Visit (INDEPENDENT_AMBULATORY_CARE_PROVIDER_SITE_OTHER): Payer: 59

## 2011-04-16 DIAGNOSIS — J309 Allergic rhinitis, unspecified: Secondary | ICD-10-CM

## 2011-04-17 NOTE — Op Note (Signed)
[name], [name]                          ACCOUNT NO.:  [id number]   MEDICAL RECORD NO.:  [id number]                   PATIENT TYPE:  AMB   LOCATION:  DSC                                  FACILITY:  [facility]   PHYSICIAN:  Harvie Junior, M.D.                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   PROCEDURE:  1. Distal soft tissue realignment procedure.  2. Proximal metatarsal osteotomy great toe.  3. Hammertoe correction second toe.  4. Extensor tendon lengthening second toe.  5. Capsulotomy metatarsal phalangeal joint.   SURGEON:  Harvie Junior, M.D.   ASSISTANT:  Marshia Ly, P.A.   ANESTHESIA:  General.   INDICATIONS FOR PROCEDURE:  This is a 65 year old female with a long history  of having significant bunion deformity on her left foot.  This was  ultimately corrected some years ago and because of continued complaints of  pain in her right foot, she ultimately came for correction of this.   DESCRIPTION OF PROCEDURE:  The patient was taken to the operating room and  after adequate anesthesia was obtained with general anesthesia, the patient  was placed supine on the operating table.  The right foot was then prepped  and draped in the usual sterile fashion.  Following this and following  esmarch exsanguination of the extremity, the tourniquet was inflated to 250  mmHg.  Following this, a linear incision was made in the 1-2 interspace.  Subcutaneous tissue was dissected down to the level of the great toe.  The  adductor hallicis was identified and divided off of the proximal phalanx.  All of the attachments to the sesamoid were identified and divided off of  the lateral sesamoid.  At this point, attention was turned toward the second  toe where the extensor brevis was section, the extensor longus was  lengthened.  The metatarsal phalangeal joint of the second toe was then  completely taken down by way of a capsulotomy and the toe did fall into a  somewhat corrected position at this point.  The PIP joint was then opened of  the second toe and the cartilaginous portion of the second toe was removed  through the distal portion of the proximal phalanx was removed and the  proximal portion of the middle phalanx was removed and at this time the  flexor longus tendon was divided at the base of the second toe.  At this  point, attention was turned back to the great toe where incision was made  over the medial aspect of the toe.  Subcutaneous tissue was dissected down  to the level of the capsule which was identified and then divided.  The  capsule was about 5 mm of capsule was divided in parallel fashion and  excised. The medial imminence was then removed and then attention was turned  to the proximal metatarsal where incision was made.  Subcutaneous tissue  dissected down to the level of the extensor tendon and periosteum was  removed.  The crescentic saw was used to make a crescentic osteotomy of the  proximal phalanx.  A pilot hole had previously been drilled over the  metatarsal and the countersink was used to countersink the screwhead.  At  this point after the osteotomy was performed, the toe was pulled medially.  Fluoroscopy was used to make sure that it was in fact almost parallel to the  second toe and the 28 mm 4.0 cancellous screw was then used to hold the  great toe metatarsal in a corrected position.  Following this, attention was  turned to the toe where the lateral capsule of the metatarsal phalangeal  joint was divided multiple times and then the toe was held in a corrected  position. The capsule was then closed in a pants over vest fashion with 0  Vicryl interrupted suture.  This straightened the great toe.   Attention was  then turned to the second toe where the K-wire was advanced out the distal  portion of the second toe and then retrograde advanced down the proximal  phalanx and then to the metatarsal head.  This held the second toe in a  corrected position.  The Z-lengthened tendon was then repaired and the  capsulotomy was left open at this point.  At this point, the wounds were  copiously irrigated and suctioned dry. Fluoroscopy was used at this point to  make sure that adequate correction had been achieved in all of these areas  and in fact it had and at this point the wound was copiously irrigated and  suctioned dry and the skin incisions were closed with 4-0 nylon running  suture.  A sterile compressive dressing was applied as well as a bunion wrap  and the patient was taken to the recovery room where she was noted to be in  satisfactory condition.  Estimated blood loss for the procedure was none.                                               Harvie Junior, M.D.    Ranae Plumber  D:  01/28/2004  T:  01/28/2004  Job:  [id number]

## 2011-04-17 NOTE — Op Note (Signed)
[facility]  Patient:    [name], [name]                      MRN: [id number] Proc. Date: 03/22/00 Adm. Date:  [id number] Attending:  Milly Jakob                           Operative Report  PREOPERATIVE DIAGNOSIS:  Severe bunion deformity with cross-over second toe.  POSTOPERATIVE DIAGNOSIS:  Severe bunion deformity with cross-over second toe.  PRINCIPAL PROCEDURES: 1. Distal soft tissue realignment procedure with bunion resection. 2. Proximal metatarsal osteotomy. 3. Extensor tendon lengthening with resection of distal metatarsal. 4. Proximal interphalangeal joint fusion with 0.062 K-wire.  SURGEON:  Harvie Junior, M.D.  ASSISTANT:  Kerby Less, P.A.  ANESTHESIA:  General.  BRIEF HISTORY:  The patient is a 65 year old female with a long history of having had severe foot deformities.  Ultimately, she started having overlap of her second toe, which became problematic with her fitting into a shoe and causing her to have skin problems of the second toe crossing over the great toe.  We evaluated her t that time and had a long talk about treatment options, but ultimately felt that the only way that this could be fixed would be to straighten her great toe by way of a corrective osteotomy and bunion procedure, and allowing room for the second toe and allow the toe to be straightened.  She was brought to the operating room for this procedure.  DESCRIPTION OF PROCEDURE:  The patient was brought to the operating room and, after adequate anesthesia was obtained with general anesthetic, the patient was placed supine on the operating table.  The left leg was then prepped and draped in the  usual sterile fashion.  Following this, Esmarch exsanguination of the extremity and the blood pressure tourniquet was inflated to 350 mmHg.  Following this, an incision was made in the web space between the first and second toes.   The subcutaneous tissue was dissected down to the level of the adductor tendon, which was then identified and then divided just proximal to its insertion into the proximal phalanx.  Following this, the first and second metatarsal space was adducted and the intermetatarsal ligament was identified and divided.  The fibular sesamoid was identified and all connections to the fibular sesamoid were taken down.  At this point, the adductor tendon was identified and a 4-0 Ethibond suture was placed in the intermetatarsal ligament.  The lateral capsule was then buttonholed with a knife to allow for 15 degrees of varus positioning of the great toe.  At this point, an incision was made over the medial eminence.  The subcutaneous tissue was dissected down to the level of the capsule.  The capsule was identified and a horizontal incision was taken out of the medial capsule. his was extended to the point where the capsular closure would allow for a straight  toe.  Following this, attention was turned proximally, where an incision was made and the metatarsocuneiform joint was identified.  Then, 1 cm distal, a mark was  made for a crescentic osteotomy and, 1 cm distal to this, a pilot hole was made  with a ______ drill.  The drill hole was made prior to doing the osteotomy, and it was also tapped and it was countersunk.  At this point, attention was turned to the osteotomy, where the crescentic osteotomy  was performed just distal to the metatarsocuneiform joint.  Following this, a ______ screw was placed in the drill hole and excellent fixation was achieved, care being taken to medially displace the proximal portion and laterally displace the distal portion.  Once this was locked in place, and x-ray was taken and excellent alignment was achieved. Specifically, the intermetatarsal angle was reduced to less than 5 degrees.  At this point, attention was turned to the distal interval, where a  dorsal, medial and lateral  capsulotomy was performed on the second toe, as well as a plantar release of the capsular tissue.  At this point, attention was turned to the proximal interphalangeal joint, which was resected through a dorsal incision and the PIP  joint fusion was undertaken with a 0.062 K-wire.  This K-wire was then advanced  into the second metatarsal head, which had to be resected to allow for reduction of the second toe.  Following this, the medial capsule was closed, straightening the second toe.  The wounds were then copiously irrigated and suctioned dry.  The skin was then closed with 4-0 nylon interrupted sutures and a sterile compressive dressing was applied with an over correction of the great toe.  The patient was  then taken to the recovery room, where she was noted to be in satisfactory condition.  Estimated blood loss for the procedure was none. DD:  03/22/00 TD:  03/23/00 Job: [id number]

## 2011-04-17 NOTE — Op Note (Signed)
[name], [name]                          ACCOUNT NO.:  [id number]   MEDICAL RECORD NO.:  [id number]                   PATIENT TYPE:  INP   LOCATION:  2924                                 FACILITY:  [facility]   PHYSICIAN:  Lina Sar, M.D. [facility]               DATE OF BIRTH:  [birth year]   DATE OF PROCEDURE:  02/04/2004  DATE OF DISCHARGE:                                 OPERATIVE REPORT   NAME OF PROCEDURE:  Upper endoscopy.   INDICATIONS:  This 65 year old white female developed painless melena and  hematochezia in a large volume associated with hypertension about 10 hours  ago.  She was at that point on aspirin, Plavix, and Integrilin prior to  cardiac catheterization for evaluation of chest pain.  These medications  were discontinued about eight hours ago.  The patient is undergoing upper  endoscopy.  Her hemoglobin was 9.7.  She is on dopamine because of  hypotension and presumed hypovolemia.   INSTRUMENT USED:  Olympus Fujinon single channel video endoscope.   SEDATION:  Versed 2 mg IV.   FINDINGS:  Esophagus.  Fujinon single channel video endoscope passed under  direct vision through the posterior pharynx into the esophagus.  The patient  was monitored by pulse oximetry and her oxygen saturation was satisfactory.  But her blood pressure fluctuated between 70 to 111 systolic.  She was only  poorly cooperative.  Proximal and distal esophageal mucosa was unremarkable.  Nasogastric tube was removed prior to the procedure, but there were no  significant erosions within the esophagus. The GE junction was normal. There  was no significant hiatal hernia.   Stomach.  The stomach was insufflated with air and showed several small  fresh blood clots, but no active bleeding.  NG tube-induced trauma was noted  along the greater curvature of the stomach proximally with several circular  contusions in this mucosa which were not bleeding.  One of them showed some  blood clot covering it.   In the gastric antrum, there were several circular  ulcerations which were covered with exudate and clearly were not associated  with NG trauma.  These were scattered ulcerations in the prepyloric antrum.  Again, there was no stigmata of active bleeding from these ulcerations.  Pyloric outlet was normal.   Duodenum.  Duodenal bulb and descending duodenum was unremarkable with no  evidence of blood in the duodenum or in the bulb.  Endoscope was then  retracted into the stomach.  Retroflexion revealed no tumors or any  significant lesions in the fundus or the cardia.  The patient tolerated the  procedure well.   IMPRESSION:  1. Antral ulcerations.  2. NG tube-induced erosions in the proximal stomach.  3. No active bleeding, except for a few small blood clots which were related     to NG tube-induced erosions.   PLAN:  I am sure that the GI bleed is from the  upper GI tract.  The noted  erosions were not actively bleeding as there was no blood in the duodenum.  She will have to be observed and further evaluation may be necessary as to  the  origin of the bleeding.  It all depends on her course in the next 12 hours.  She is already on Protonix 40 mg IV q.12h and bowel rest.  I would hold off  on reinserting the NG tube.   All anticoagulants have been discontinued.  If bleeding continues, bleeding  scan or arteriogram may be considered.                                               Lina Sar, M.D. [facility]    DB/MEDQ  D:  02/04/2004  T:  02/05/2004  Job:  [id number]   cc:   Deirdre Peer. Nehemiah Settle, M.D.   Pamella Pert, M.D.

## 2011-04-17 NOTE — Procedures (Signed)
REFERRING PHYSICIAN:  Dr. Cammie Mcgee L. Lendell Caprice of the hospitalist care.   COMMENT:  Technician noticed that this patient is treated with Valium and  Haldol for delirium.  She appears awake, however, and is described as a  right-handed female.   TYPE OF EEG:  Portable.  Hyperventilation and physical stimulation were  deferred.  This is a 17-channel EEG recording with 1 channel representing  heart rate and rhythm.  The 10-20 placement system of electrodes were chosen  to represent the study.   DESCRIPTION:  This is an undetectable posterior-dominant background.  The  EEG is poorly organized and shows slow delta- and theta-range frequencies  with very high amplitudes but no epileptiform discharges.  The variability  of EEG frequency does not correspond with external stimuli.  The patient is  not described as asleep by the technician but has a sleep-like delta-/theta-  range pattern but progresses to central highest amplitudes.   CONCLUSION:  This is an abnormal electroencephalogram due to severe slowing,  indicating an encephalopathic generalized process.  No epileptiform  discharges are seen.  This could be a postictal electroencephalogram;  clinical correlation is recommended.    Melvyn Novas, M.D.   [id number]  D:  02/02/2004 02:32:03  T:  02/02/2004 06:37:15  Job #:  [id number]   cc:   Corinna L. Lendell Caprice, MD   [facility]

## 2011-04-17 NOTE — Discharge Summary (Signed)
[name], [name]                          ACCOUNT NO.:  [id number]   MEDICAL RECORD NO.:  [id number]                   PATIENT TYPE:  INP   LOCATION:  2030                                 FACILITY:  [facility]   PHYSICIAN:  Kela Millin, M.D.             DATE OF BIRTH:  18-Jan-1946   DATE OF ADMISSION:  01/31/2004  DATE OF DISCHARGE:  02/09/2004                                 DISCHARGE SUMMARY   DISCHARGE DIAGNOSES:  1. Delirium/psychosis.  2. Hypoglycemia.  3. Non-Q-wave myocardial infarction.  4. Congestive heart failure, diastolic dysfunction.  5. Diabetes mellitus, type 2.  6. Antral/gastric ulcers.  7. Hypertension.  8. Chronic obstructive pulmonary disease/asthma.  9. Hypothyroidism.  10.      Hypokalemia.   CARDIAC PROCEDURES:  1. Cardiac catheterization, normal coronaries and LV EF 60%, dominant     circumflex system - per Dr. Alanda Amass.  2. EGD - per Dr. Lina Sar, antral ulcerations, no active bleeding.  3. EEG, no epileptiform discharges.   CONSULTATIONS:  1. Surgery, Dr. Luiz Blare.  2. Cardiology, Dr. Alanda Amass.  3. Gastroenterology, Dr. Lina Sar.  4. Psychiatry.   HISTORY:  The patient is a 65 year old white female with a past medical  history significant for diabetes mellitus, status post right foot surgery  about four days prior to admission, who presented with complaints of  episodic confusion and hallucinations per family of over the few days  following her surgery.  She had also had a decreased p.o. intake per family  and so she was brought to the ER.  On reviewing the chart and EMS notes, it  was noted that en route, the patient's blood glucose was 40 and she was  given glucagon.  Also upon arrival in the ER she was evaluated and it was  noted that she was febrile with an elevated white count and she was thus  admitted for further evaluation and management.   Upon admission on physical exam, in general she was an obese, white female,  who  looked older than her stated age, combative periodically, and conversant  at intervals.  She was not following commands and was disoriented.  Her  temperature at the time was 97 (101.6 in the ER); her blood pressure was  161/50.  Her lungs were clear to auscultation bilaterally with no wheezes  and no crackles.  Her cardiovascular exam showed regular rate and rhythm  with no S3 present and her abdomen was obese with bowel sounds present and  it was soft.  On the extremities, her right lower extremity showed sutures  which were intact around the surgical incision with ecchymotic areas around  the incision.  There was no discharge and on the lateral aspect of the  dorsum there was erythema.  There was also tenderness and warmth as well as  1+ edema.  Also on the posterior aspect of her right foot there was bruising  present  and the left lower extremity had +1 edema with no erythema and no  warmth.  Her white cell count was 15.1 with a hemoglobin of 9.6, hematocrit  of 28.3 and a platelet count of 248.  The sodium was 138, potassium 3.4,  chloride 106, CO2 of 28, her BUN was 23, creatinine 1.1 and the glucose was  140.  The urinalysis was negative.   HOSPITAL COURSE:  The patient was admitted to the intensive care unit.  Her  acute confusion/psychosis was thought to have multifactorial etiologies  including medication reactions to Demerol, Ativan given in the ER as well as  hypoglycemia and infection.  Her case was discussed with psychiatry and they  recommended continuing Haldol along with Cogentin.   On recheck in the ER the patient was still hypoglycemic and the patient  received several doses of D-50 and because this was recurrent and it was  noted that the patient had been on Lantus as well as oral hypoglycemics at  home she was started on D-5 at 50 cc and her Accu-Cheks were monitored.   The patient was also started on IV Unasyn for cellulitis of the right foot  given the physical  exam and the fever and leukocytosis.   She continued to be agitated and combative, even with the Haldol, Geodon was  recommended and subsequently the psychotic behavior resolved.   The follow-up chest x-ray done during her hospital stay revealed congestive  heart failure and her IV fluids were discontinued and she was given Lasix.  Cardiac enzymes were also done and these were normal.  As a result the  patient was transferred from [facility] to [facility] for further  evaluation and treatment.  Prior to transfer, the patient was thought to  have had a seizure.  An EEG was done, but this did not show any epileptiform  activity.  The patient was started on Integrilin, Plavix and aspirin  continued.  She was also put on the nitroglycerin drip and Natrecor and  transferred to [facility].  Cardiology saw the patient at [facility]  and cardiac catheterization was done and the findings are as stated above,  normal coronaries and normal LV function with a dominant circumflex system.  The Integrilin and Plavix were discontinued following the catheterization  findings.   While at [facility] the patient also developed GI bleed - bright red blood per  rectum - which was thought to have been precipitated by the antiplatelet  agents.  Gastroenterology was consulted and an EGD was done which revealed  enteral ulcerations.  The patient was treated with IV Protonix.   Biopsies/H. pylori studies were also done and the patient was positive for  H. pylori.  I discussed this with Dr. Juanda Chance and she recommended that she be  started on Aciphex, Biaxin and amoxicillin for a total of seven days upon  discharge and to follow-up with her.   [name] developed hypotension along with a GI bleed and this was treated  with vasopressors.  Surgery followed the patient in the hospital and  determined that there was no infection in the incision and the patient was to follow-up with Dr. Luiz Blare upon discharge.    For her COPD/asthma, she was treated with bronchodilators throughout her  hospital stay.  Her Advair was also resumed.   She was maintained on Zocor for hyperlipidemia and her Accu-Cheks were  monitored throughout her hospital stay.  The Lantus was subsequently  restarted when her blood sugars stabilized  and she was tolerating p.o. well.  Her Glucophage was held throughout her hospital stay because of the  procedures that she had including the cardiac catheterization and the  patient is to follow-up with her primary care physician, Dr. Emeline Darling, regarding  resuming her Glucophage.  She was also advised to keep a log of her blood  sugars following discharge and take them to her follow-up visit.   Her mental status cleared up and physical therapy was consulted while she  was in the hospital.  Home health services with physical therapy were  recommended and the patient is discharged with home health.   The patient was also maintained on her Paxil for depression/anxiety while in  the hospital.   Ms. Sliva was hemodynamically stable, tolerating p.o. well and was febrile  and she was discharged home in good condition.   DISCHARGE MEDICATIONS:  1. Lantus 35 units subcu q.d.  2. Metoprolol 12.5 mg b.i.d.  3. Lasix 40 mg q.d.  4. Advair one puff b.i.d.  5. Aciphex 20 mg b.i.d. for seven days.  6. Biaxin 500 mg b.i.d. for seven days.  7. Amoxicillin 1 gram b.i.d. for seven days.  8. Paxil 40 mg q.d.  9. Zocor 40 mg q.h.s.  10.      Novolog sliding scale as directed.  11.      Xanax 0.25 mg b.i.d. p.r.n.  12.      Albuterol nebulizer every four to six hours p.r.n.  13.      Enteric coated aspirin 81 mg q.d.  14.      The patient is not to take her Glucophage until after her follow-up     with Dr. Emeline Darling.   DISCHARGE DIET:  Low carbohydrate, modified.   FOLLOW-UP CARE:  1. Dr. Juanda Chance in April.  2. Follow-up with Dr. Luiz Blare on March 17, orthopaedics.  3. Follow-up with Dr. Emeline Darling, [facility] in one week.                                                Kela Millin, M.D.    ACV/MEDQ  D:  03/05/2004  T:  03/07/2004  Job:  [id number]   cc:   Duke Salvia, M.D.

## 2011-04-17 NOTE — Cardiovascular Report (Signed)
[name], VEACH                          ACCOUNT NO.:  [id number]   MEDICAL RECORD NO.:  [id number]                   PATIENT TYPE:  INP   LOCATION:  2924                                 FACILITY:  [facility]   PHYSICIAN:  Richard A. Alanda Amass, M.D.          DATE OF BIRTH:  May 05, 1946   DATE OF PROCEDURE:  02/04/2004  DATE OF DISCHARGE:                              CARDIAC CATHETERIZATION   PROCEDURE:  Retrograde central aortic catheterization, selective coronary  angiography via Judkins technique, LV angiogram RAO, LAO projection,  abdominal aortic angiogram midstream PA projection.   DESCRIPTION OF PROCEDURE:  The patient was brought to the second floor CP  lab in postabsorptive state without premedication.  She has had multiple  drug reactions in the past.  One percent Xylocaine was used for local  anesthesia.  The right groin was prepped, draped in the usual manner.  The  CRFA was entered with an anterior puncture using 18 thin-wall needle and a 6  French short diagnostic side arm sheath was inserted without difficulty.  Catheterization was done with 3.5 cm taper Scimed left and a 4 cm taper  Cordis right coronary catheter.  LV angiogram was done in the RAO and LAO  projection through a 6 French pigtail catheter at 25 mL, 14 mL per second  and 20 mL, 12 mL per second.  Pullback pressure of the CA was performed  showing no gradient across the aortic valve.  Abdominal aortic angiogram was  done in the midstream PA projection above the level of the renal arteries at  25 mL, 20 mL per second.  This demonstrated single normal renal arteries  bilaterally.  Normal infrarenal abdominal aorta without significant  atherosclerotic changes.  No aneurysm or stenosis.  The iliacs appeared  normal bilaterally and were tortuous.  The hypogastrics were intact.  There  appeared to be good run off.   Fluoroscopy showed 1+ calcification superior to the main left coronary, but  was nonobstructive.   There was no intracardiac or valvular calcification  seen.   LV angiogram in the RAO and LAO projection showed a normally contracting  ventricle with EF approximately 60% and no mitral regurgitation.   The main left coronary artery was short, but normal.   The LAD coursed to the apex of the heart where it bifurcated.  It was  moderately sized with normal flow and smooth throughout with no stenosis or  spasm.  There was a normal large DX1 after SP1 and a normal moderate size  DX2 from the mid LAD.   The circumflex was a large dominant vessel.  It gave off a moderate size  normal OM-1, a moderately large normal OM-2, a tortuous normal OM-3 and a  large PDA and PLA branch.  The circumflex was normally throughout, widely  patent and smooth.   The right coronary was a nondominant artery supplying predominantly right  ventricle and was normal.   DISCUSSION:  This 65 year old white married mother of six with a history of  smoking, has a history of exogenous obesity, hypertension and thyroid  disorder.  She is a medical patient of Dr. Wyonia Hough.  She recently had  bunion surgery by Dr. Luiz Blare on January 28, 2004 as an outpatient.  She was  admitted to [facility] January 31, 2004 with delirium and confusion.  She has multiple medical allergies.  She has a prior history of  hypothyroidism, chronic obstructive pulmonary disease and asthma and  depression.   Appropriate neurologic workup was done which showed no significant  abnormalities.  There was no evidence of PE and she had anemia.  Cardiology  was consulted for elevated CK of greater than 2000, negative MB and  borderline troponin.  Normal BNP and TSH.  No acute EKG changes.  She was  felt to be high risk patient.  Started on Plavix and Integrilin  precatheterization and had been treated with high dose Lasix and Natrecor  for possible LV dysfunction.  She developed positive stools on the of the  procedure.  When she was  brought to the lab, I stopped her Integrilin,  heparin.  Lovenox was held after her a.m. dose.  Mental status remained  appropriate and good in the laboratory without sedation.   The patient has essentially normal coronary arteries and left ventricle.  I  could not rule out diastolic dysfunction, but she has normal systolic  function with moderate systemic hypertension and normal renal arteries.   The etiology of her elevated enzymes are not clear as she may have had a  seizure related to her toxic delirium and drug allergies.  I would recommend  therapy at this time including discontinuation of anticoagulants and  antiplatelet therapy in the setting of possible recent GI bleeding.  We will  defer to medical service for further workup.  The case will be discussed  with her physicians.   CATHETERIZATION DIAGNOSES:  1. Normal coronary arteries and left ventricle, dominant circumflex system.  2. Systemic hypertension.  3  Exogenous obesity.  1. History of depression.  2. Adult onset diabetes mellitus.  3. History of chronic obstructive pulmonary disease in a smoker 35 pack     year.  4. Bronchitis.  5. Hypothyroidism.                                               Richard A. Alanda Amass, M.D.    RAW/MEDQ  D:  02/04/2004  T:  02/04/2004  Job:  [id number]   cc:   Dr. Lendell Caprice, [facility]   Harvie Junior, M.D.  [address]  [place]  [place] [address]  Fax: [phone]   Rosalyn Gess. Norins, M.D. [facility]

## 2011-04-22 ENCOUNTER — Other Ambulatory Visit: Payer: Self-pay | Admitting: Internal Medicine

## 2011-04-22 ENCOUNTER — Ambulatory Visit (INDEPENDENT_AMBULATORY_CARE_PROVIDER_SITE_OTHER): Payer: 59

## 2011-04-22 DIAGNOSIS — J309 Allergic rhinitis, unspecified: Secondary | ICD-10-CM

## 2011-04-24 NOTE — Telephone Encounter (Signed)
Please Advise refills 

## 2011-04-26 ENCOUNTER — Other Ambulatory Visit: Payer: Self-pay | Admitting: Internal Medicine

## 2011-04-27 NOTE — Telephone Encounter (Signed)
Ok for refill? 

## 2011-04-28 ENCOUNTER — Telehealth: Payer: Self-pay | Admitting: *Deleted

## 2011-04-28 MED ORDER — EZETIMIBE 10 MG PO TABS: 10.0000 mg | ORAL_TABLET | Freq: Every day | ORAL | Status: DC

## 2011-04-28 NOTE — Telephone Encounter (Signed)
Refill sent in

## 2011-04-28 NOTE — Telephone Encounter (Signed)
Patient requesting RF of klonopin. Scheduled for next OV July 2012

## 2011-04-28 NOTE — Telephone Encounter (Signed)
Left detailed vm for pt to call back w/name of pharm for RX (last was given rx for #180 in October) Pt takes Klonopin 1 mg 1-2 hs prn.

## 2011-04-28 NOTE — Telephone Encounter (Signed)
Ok for refill x 5 

## 2011-04-29 MED ORDER — CLONAZEPAM 1 MG PO TABS: 1.0000 mg | ORAL_TABLET | Freq: Every evening | ORAL | Status: DC | PRN

## 2011-04-29 NOTE — Telephone Encounter (Signed)
RX called in, pt called back w/info for pharm

## 2011-04-30 ENCOUNTER — Other Ambulatory Visit: Payer: Self-pay | Admitting: Internal Medicine

## 2011-05-04 ENCOUNTER — Encounter: Payer: Self-pay | Admitting: Internal Medicine

## 2011-05-04 NOTE — Telephone Encounter (Signed)
Done previously

## 2011-05-05 ENCOUNTER — Ambulatory Visit (INDEPENDENT_AMBULATORY_CARE_PROVIDER_SITE_OTHER): Payer: 59

## 2011-05-05 ENCOUNTER — Other Ambulatory Visit: Payer: Self-pay | Admitting: Internal Medicine

## 2011-05-05 DIAGNOSIS — J309 Allergic rhinitis, unspecified: Secondary | ICD-10-CM

## 2011-05-11 ENCOUNTER — Telehealth: Payer: Self-pay | Admitting: *Deleted

## 2011-05-11 NOTE — Telephone Encounter (Signed)
Patient requesting Rx to resume apidra(? - I do not see on previous med list). She says cbg's have been elevated recently and she would like to resume additional insulin.

## 2011-05-11 NOTE — Telephone Encounter (Signed)
Was on novolog sliding scale. Will need ov to set up regimen for before meal insulin

## 2011-05-11 NOTE — Telephone Encounter (Signed)
Scheduled for Thurs at 9 am

## 2011-05-14 ENCOUNTER — Ambulatory Visit: Payer: 59 | Admitting: Internal Medicine

## 2011-05-14 DIAGNOSIS — Z0289 Encounter for other administrative examinations: Secondary | ICD-10-CM

## 2011-05-15 ENCOUNTER — Ambulatory Visit (INDEPENDENT_AMBULATORY_CARE_PROVIDER_SITE_OTHER): Payer: 59

## 2011-05-15 DIAGNOSIS — J309 Allergic rhinitis, unspecified: Secondary | ICD-10-CM

## 2011-05-19 ENCOUNTER — Ambulatory Visit (INDEPENDENT_AMBULATORY_CARE_PROVIDER_SITE_OTHER): Payer: 59 | Admitting: Internal Medicine

## 2011-05-19 ENCOUNTER — Encounter: Payer: Self-pay | Admitting: Internal Medicine

## 2011-05-19 VITALS — BP 122/62 | HR 76 | Ht 63.0 in | Wt 229.2 lb

## 2011-05-19 DIAGNOSIS — J449 Chronic obstructive pulmonary disease, unspecified: Secondary | ICD-10-CM

## 2011-05-19 NOTE — Progress Notes (Signed)
  Subjective:    Patient ID: [name], female    DOB: [birth year], 65 y.o.   MRN: [id number]  HPI 05/19/11- 1 yoF former heavy smoker followed for asthma/ COPD, hx food intolerance, allergic rhinitis, complicated by obesity, DM Last here November 20, 2010- note reviewed Allergy vaccine 1:10 GH Says she has been doing well. Spring pollen bothered nose and chest in March. Had some infection then, helped by doxycycline.  Reports no change in dry hacking cough, not relieved by cough syrup. Occasional, infrequent heart burn. CXR 02/08/11- [place]- lungs clear, ASVD We discussed updating PFT. She had smoked up to 3 PPD  Review of Systems Constitutional:   No weight loss, night sweats,  Fevers, chills, fatigue, lassitude. HEENT:   No headaches,  Difficulty swallowing,  Tooth/dental problems,  Sore throat,                No sneezing, itching, ear ache, nasal congestion, post nasal drip,   CV:  No chest pain,  Orthopnea, PND, swelling in lower extremities, anasarca, dizziness, palpitations  GI  No heartburn, indigestion, abdominal pain, nausea, vomiting, diarrhea, change in bowel habits, loss of appetite  Resp:   No coughing up of blood.  No change in color of mucus.  No wheezing.    Skin: no rash or lesions.  GU: no dysuria, change in color of urine, no urgency or frequency.  No flank pain.  MS:  No joint pain or swelling.  No decreased range of motion.  No back pain.  Psych:  No change in mood or affect. No depression or anxiety.  No memory loss.      Objective:   Physical Exam General- Alert, Oriented, Affect-appropriate, Distress- none acute   obese  Skin- rash-none, lesions- none, excoriation- none  Lymphadenopathy- none  Head- atraumatic  Eyes- Gross vision intact, PERRLA, conjunctivae clear, secretions  Ears- Hearing, canals, - normal  Nose- Clear, No-Septal dev, mucus, polyps, erosion, perforation   Throat- Mallampati II , mucosa clear , drainage- none, tonsils-  atrophic  Neck- flexible , trachea midline, no stridor , thyroid nl, carotid no bruit  Chest - symmetrical excursion , unlabored     Heart/CV- RRR , no murmur , no gallop  , no rub, nl s1 s2                     - JVD- none , edema- none, stasis changes- none, varices- none     Lung- clear to P&A- minimally coarse wheeze- none, cough- none while here , dullness-none, rub- none     Chest wall-   Abd- tender-no, distended-no, bowel sounds-present, HSM- no  Br/ Gen/ Rectal- Not done, not indicated  Extrem- cyanosis- none, clubbing, none, atrophy- none, strength- nl  Neuro- grossly intact to observation          Assessment & Plan:

## 2011-05-19 NOTE — Patient Instructions (Signed)
Order- schedule PFT  Dx COPD

## 2011-05-19 NOTE — Assessment & Plan Note (Signed)
We are going to update PFT, hoping to get her off some meds. She is hard to pin down on cough character and amount. Failed tessalon and Spiriva

## 2011-05-25 ENCOUNTER — Ambulatory Visit (INDEPENDENT_AMBULATORY_CARE_PROVIDER_SITE_OTHER): Payer: 59

## 2011-05-25 DIAGNOSIS — J309 Allergic rhinitis, unspecified: Secondary | ICD-10-CM

## 2011-05-26 ENCOUNTER — Other Ambulatory Visit: Payer: Self-pay | Admitting: Internal Medicine

## 2011-06-02 ENCOUNTER — Ambulatory Visit (INDEPENDENT_AMBULATORY_CARE_PROVIDER_SITE_OTHER): Payer: 59

## 2011-06-02 DIAGNOSIS — J309 Allergic rhinitis, unspecified: Secondary | ICD-10-CM

## 2011-06-05 ENCOUNTER — Ambulatory Visit (INDEPENDENT_AMBULATORY_CARE_PROVIDER_SITE_OTHER): Payer: 59 | Admitting: Internal Medicine

## 2011-06-05 ENCOUNTER — Encounter: Payer: Self-pay | Admitting: Internal Medicine

## 2011-06-05 VITALS — BP 116/60 | HR 84 | Temp 98.5°F | Wt 226.0 lb

## 2011-06-05 DIAGNOSIS — E119 Type 2 diabetes mellitus without complications: Secondary | ICD-10-CM

## 2011-06-05 DIAGNOSIS — E785 Hyperlipidemia, unspecified: Secondary | ICD-10-CM

## 2011-06-05 DIAGNOSIS — J449 Chronic obstructive pulmonary disease, unspecified: Secondary | ICD-10-CM

## 2011-06-05 DIAGNOSIS — Z0000 Encounter for general adult medical examination without abnormal findings: Secondary | ICD-10-CM

## 2011-06-05 DIAGNOSIS — Z23 Encounter for immunization: Secondary | ICD-10-CM

## 2011-06-05 DIAGNOSIS — D509 Iron deficiency anemia, unspecified: Secondary | ICD-10-CM

## 2011-06-05 DIAGNOSIS — Z136 Encounter for screening for cardiovascular disorders: Secondary | ICD-10-CM

## 2011-06-05 DIAGNOSIS — E039 Hypothyroidism, unspecified: Secondary | ICD-10-CM

## 2011-06-05 DIAGNOSIS — I10 Essential (primary) hypertension: Secondary | ICD-10-CM

## 2011-06-05 DIAGNOSIS — E538 Deficiency of other specified B group vitamins: Secondary | ICD-10-CM

## 2011-06-05 MED ORDER — PNEUMOCOCCAL VAC POLYVALENT 25 MCG/0.5ML IJ INJ: 0.5000 mL | INJECTION | Freq: Once | INTRAMUSCULAR | Status: DC

## 2011-06-05 NOTE — Progress Notes (Signed)
Subjective:    Patient ID: [name], female    DOB: 07-Feb-1946, 65 y.o.   MRN: [id number]  HPI   The patient is here for annual Medicare wellness examination and management of other chronic and acute problems.   The risk factors are reflected in the social history.  The roster of all physicians providing medical care to patient - is listed in the Snapshot section of the chart.  Activities of daily living:  The patient is 100% inedpendent in all ADLs: dressing, toileting, feeding as well as independent mobility  Home safety : The patient has smoke detectors in the home. They wear seatbelts.No firearms at home . There is no violence in the home.   There is no risks for hepatitis, STDs or HIV. There is no   history of blood transfusion. They have no travel history to infectious disease endemic areas of the world.  The patient has not seen their dentist in the last six month. They have seen their eye doctor in the last year. They deny any hearing difficulty and have not had audiologic testing in the last year.  They do not  have excessive sun exposure. Discussed the need for sun protection: hats, long sleeves and use of sunscreen if there is significant sun exposure.   Diet: the importance of a healthy diet is discussed. They do have a healthy (unhealthy-high fat/fast food) diet.  The patient does nothave a regular exercise program.  The benefits of regular aerobic exercise were discussed  Depression screen: there are no signs or vegative symptoms of depression- irritability, change in appetite, anhedonia, sadness/tearfullness.  Cognitive assessment: the patient manages all their financial and personal affairs and is actively engaged. They could relate day,date,year and events; recalled 3/3 objects at 3 minutes; performed clock-face test normally.  The following portions of the patient's history were reviewed and updated as appropriate: allergies, current medications, past family  history, past medical history,  past surgical history, past social history  and problem list.  Vision, hearing, body mass index were assessed and reviewed.   During the course of the visit the patient was educated and counseled about appropriate screening and preventive services including : fall prevention , diabetes screening, nutrition counseling, colorectal cancer screening, and recommended immunizations.   Review of Systems Review of Systems  Constitutional:  Negative for fever, chills, activity change and unexpected weight change.  HEENT:  Negative for hearing loss, ear pain, congestion, neck stiffness and postnasal drip. Negative for sore throat or swallowing problems. Negative for dental complaints.   Eyes: Negative for vision loss or change in visual acuity.  Respiratory: Negative for chest tightness and wheezing.   Cardiovascular: Negative for chest pain and palpitation. No decreased exercise tolerance Gastrointestinal: No change in bowel habit. No bloating or gas. No reflux or indigestion Genitourinary: Negative for urgency, frequency, flank pain and difficulty urinating.  Musculoskeletal: Negative for myalgias, back pain, arthralgias and gait problem.  Neurological: Negative for dizziness, tremors, weakness and headaches.  Hematological: Negative for adenopathy.  Psychiatric/Behavioral: Negative for behavioral problems and dysphoric mood.       Objective:   Physical Exam Vitals reviewed. Obesity Gen'l: well nourished, well developed white woman in no distress HEENT - [place]/AT, EACs/TMs normal, oropharynx with full dentures, no buccal , posterior pharynx clear, mucous membranes moist. C&S clear, PERRLA, fundi - normal Neck - supple, no thyromegaly Nodes- negative submental, cervical, supraclavicular regions Chest - no deformity, no CVAT Lungs - clear without rales, wheezes. No increased  work of breathing Breast -skin normal, nipples without discharge, minor fibrocystic changes  but no fixed mass or lesion, no axillary adenopathy Cardiovascular - regular rate and rhythm, quiet precordium, no murmurs, rubs or gallops, 2+ radial, DP and PT pulses Abdomen - BS+ x 4, no HSM, no guarding or rebound or tenderness Pelvic - deferred  Rectal - deferred  Extremities - no clubbing, cyanosis, edema or deformity.  Neuro - A&O x 3, CN II-XII normal, motor strength normal and equal, DTRs 2+ and symmetrical biceps, radial, and patellar tendons. Cerebellar - no tremor, no rigidity, fluid movement and normal gait. Derm - Head, neck, back, abdomen and extremities without suspicious lesions.  Lab Results  Component Value Date   WBC 9.8 11/07/2010   HGB 13.2 11/07/2010   HCT 38.7 11/07/2010   PLT 248.0 11/07/2010   CHOL 153 12/19/2010   TRIG 174.0* 12/19/2010   HDL 50.60 12/19/2010   LDLDIRECT 124.1 04/10/2008   ALT 15 12/19/2010   AST 18 12/19/2010   NA 143 12/19/2010   K 3.8 12/19/2010   CL 104 12/19/2010   CREATININE 0.9 12/19/2010   BUN 17 12/19/2010   CO2 30 12/19/2010   TSH 2.85 12/19/2010   HGBA1C 7.8* 12/19/2010           Assessment & Plan:

## 2011-06-07 DIAGNOSIS — Z0000 Encounter for general adult medical examination without abnormal findings: Secondary | ICD-10-CM | POA: Insufficient documentation

## 2011-06-07 NOTE — Assessment & Plan Note (Signed)
BP Readings from Last 3 Encounters:  06/05/11 116/60  05/19/11 122/62  02/11/11 140/66   Good control on present medications - no changes.

## 2011-06-07 NOTE — Assessment & Plan Note (Signed)
Last TSH was in normal range  Plan - continue present dose thyroid replacement

## 2011-06-07 NOTE — Assessment & Plan Note (Signed)
Last calculate LDL 68 and at goal. This was just at 6 months ago. Previous study revealed LDL 75.  Plan - continue present regimen with follow up lab in about 6 months.

## 2011-06-07 NOTE — Assessment & Plan Note (Signed)
Lab Results  Component Value Date   HGBA1C 7.8* 12/19/2010   Mildly elevated but below threshold for medication change = 8%  Plan - continue present regimen           Advised to very adherent to diabetic diet choices.

## 2011-06-07 NOTE — Assessment & Plan Note (Signed)
Interval medical history is normal. Phyiscal exam, sans pelvic, is normal. Lab results from the past six months look good. Breast health- last mammogram for Aug '11 OK. She will schedule follow-up exam. She is current for colorectal cancer screening with last exam Dec '11. Immunizations: Tetanus - Sept '09; Pneumonia vaccine administered today; Shingles vaccine - she will check for coverage and if covered will return in 2 weeks for vaccine.  In summary - a nice woman who is trying hard to adhere to her medical and life-style regimen. She has lost some weight and is encouraged to continue this trend. Target weight 170 lbs, goal is to loose 1 lb /month.   She will return as needed or in 6 months for rouitne follow-up.

## 2011-06-07 NOTE — Assessment & Plan Note (Signed)
Stable and she is current with her pulmonologist

## 2011-06-10 ENCOUNTER — Ambulatory Visit (INDEPENDENT_AMBULATORY_CARE_PROVIDER_SITE_OTHER): Payer: 59

## 2011-06-10 ENCOUNTER — Other Ambulatory Visit (INDEPENDENT_AMBULATORY_CARE_PROVIDER_SITE_OTHER): Payer: 59

## 2011-06-10 ENCOUNTER — Other Ambulatory Visit: Payer: Self-pay | Admitting: Internal Medicine

## 2011-06-10 DIAGNOSIS — E039 Hypothyroidism, unspecified: Secondary | ICD-10-CM

## 2011-06-10 DIAGNOSIS — I10 Essential (primary) hypertension: Secondary | ICD-10-CM

## 2011-06-10 DIAGNOSIS — D509 Iron deficiency anemia, unspecified: Secondary | ICD-10-CM

## 2011-06-10 DIAGNOSIS — E785 Hyperlipidemia, unspecified: Secondary | ICD-10-CM

## 2011-06-10 DIAGNOSIS — E119 Type 2 diabetes mellitus without complications: Secondary | ICD-10-CM

## 2011-06-10 DIAGNOSIS — J309 Allergic rhinitis, unspecified: Secondary | ICD-10-CM

## 2011-06-10 LAB — CBC WITH DIFFERENTIAL/PLATELET
Basophils Absolute: 0 10*3/uL (ref 0.0–0.1)
Eosinophils Absolute: 0.1 10*3/uL (ref 0.0–0.7)
HCT: 37.3 % (ref 36.0–46.0)
Lymphocytes Relative: 26.6 % (ref 12.0–46.0)
Lymphs Abs: 2.1 10*3/uL (ref 0.7–4.0)
MCHC: 34 g/dL (ref 30.0–36.0)
Monocytes Relative: 6.6 % (ref 3.0–12.0)
Platelets: 247 10*3/uL (ref 150.0–400.0)
RDW: 13.9 % (ref 11.5–14.6)

## 2011-06-10 LAB — HEPATIC FUNCTION PANEL
Bilirubin, Direct: 0.1 mg/dL (ref 0.0–0.3)
Total Bilirubin: 0.5 mg/dL (ref 0.3–1.2)

## 2011-06-10 LAB — LIPID PANEL
HDL: 43 mg/dL (ref >39.00)
Total CHOL/HDL Ratio: 5
VLDL: 72.4 mg/dL — ABNORMAL HIGH (ref 0.0–40.0)

## 2011-06-10 LAB — HEMOGLOBIN A1C: Hgb A1c MFr Bld: 8.4 % — ABNORMAL HIGH (ref 4.6–6.5)

## 2011-06-10 LAB — COMPREHENSIVE METABOLIC PANEL
ALT: 15 U/L (ref 0–35)
Albumin: 3.9 g/dL (ref 3.5–5.2)
CO2: 30 mEq/L (ref 19–32)
GFR: 73.83 mL/min (ref >60.00)
Glucose, Bld: 166 mg/dL — ABNORMAL HIGH (ref 70–99)
Potassium: 3.7 mEq/L (ref 3.5–5.1)
Sodium: 139 mEq/L (ref 135–145)
Total Protein: 6.7 g/dL (ref 6.0–8.3)

## 2011-06-10 LAB — LDL CHOLESTEROL, DIRECT: Direct LDL: 132.7 mg/dL

## 2011-06-15 ENCOUNTER — Encounter: Payer: Self-pay | Admitting: Internal Medicine

## 2011-06-18 ENCOUNTER — Ambulatory Visit (INDEPENDENT_AMBULATORY_CARE_PROVIDER_SITE_OTHER): Payer: 59

## 2011-06-18 ENCOUNTER — Other Ambulatory Visit: Payer: Self-pay | Admitting: Internal Medicine

## 2011-06-18 DIAGNOSIS — Z2911 Encounter for prophylactic immunotherapy for respiratory syncytial virus (RSV): Secondary | ICD-10-CM

## 2011-06-18 DIAGNOSIS — Z23 Encounter for immunization: Secondary | ICD-10-CM

## 2011-06-22 ENCOUNTER — Other Ambulatory Visit: Payer: Self-pay | Admitting: Internal Medicine

## 2011-06-23 NOTE — Telephone Encounter (Signed)
Rx Done . 

## 2011-06-24 ENCOUNTER — Ambulatory Visit (INDEPENDENT_AMBULATORY_CARE_PROVIDER_SITE_OTHER): Payer: 59

## 2011-06-24 DIAGNOSIS — J309 Allergic rhinitis, unspecified: Secondary | ICD-10-CM

## 2011-07-03 ENCOUNTER — Ambulatory Visit (INDEPENDENT_AMBULATORY_CARE_PROVIDER_SITE_OTHER): Payer: 59 | Admitting: Internal Medicine

## 2011-07-03 ENCOUNTER — Other Ambulatory Visit: Payer: Self-pay | Admitting: Internal Medicine

## 2011-07-03 DIAGNOSIS — E039 Hypothyroidism, unspecified: Secondary | ICD-10-CM

## 2011-07-03 DIAGNOSIS — E785 Hyperlipidemia, unspecified: Secondary | ICD-10-CM

## 2011-07-03 DIAGNOSIS — E119 Type 2 diabetes mellitus without complications: Secondary | ICD-10-CM

## 2011-07-03 MED ORDER — COLESEVELAM HCL 3.75 G PO PACK: 1.0000 | PACK | ORAL | Status: DC

## 2011-07-03 MED ORDER — VENLAFAXINE HCL 75 MG PO TABS: 75.0000 mg | ORAL_TABLET | Freq: Two times a day (BID) | ORAL | Status: DC

## 2011-07-03 MED ORDER — LEVOTHYROXINE SODIUM 88 MCG PO TABS: 88.0000 ug | ORAL_TABLET | Freq: Every day | ORAL | Status: DC

## 2011-07-03 NOTE — Progress Notes (Signed)
  Subjective:    Patient ID: [name], female    DOB: 12-29-1945, 65 y.o.   MRN: [id number]  HPI [name] was seen [place] 6th for Annual wellness exam - see report. Subsequent lab July 11th revealed an AQ1C 8.4%, LDL 140 and TSH 8.o. She presents today to address these issues. She is feeling ok with no new complaints.  PMH, FamHx and SocHx reviewed for any changes and relevance.    Review of Systems System review is negative for any constitutional, cardiac, pulmonary, GI or neuro symptoms or complaints     Objective:   Physical Exam Vitals stable Gen'l - obese white woman in no distress HEENT C&S clear  cor - RRR  PUlm- normal respirations.        Assessment & Plan:

## 2011-07-03 NOTE — Patient Instructions (Signed)
Thyroid - a minor elevation in the TSH at 8 with normal being up to 5.5. This means there is minimal decrease in thyroid hormone levels. Plan - the next prescription for thyroid replacement will be 88 mcg.   Cholesterol - definitely too high at approximately 140. Plan - change medication from simvastatin (zocor) to  welchol powder once a day. Conitnue the zetia once a day.  Recheck lab in 6 weeks.   Diabetes - the A1C was 8.4% - too high. Plan - continue metformin 1000mg  twice a day. Increase the Lantus to 45 units once a day. Keep a check on the AM fasting blood sugar - the goal is 100 or there about.

## 2011-07-05 NOTE — Assessment & Plan Note (Signed)
Poor control with elevated A1C  Plan - continue metformin 1 g bid           Increase lantus to 45 units daily           Keep record of AM fasting CBGs

## 2011-07-05 NOTE — Assessment & Plan Note (Signed)
Elevated TSH but minor.  Plan - incrase levothyroxine to 88 mcg with next Rx.

## 2011-07-05 NOTE — Assessment & Plan Note (Signed)
In adequate control.  Plan - stop zocor           Start Welchol 3.5 g daily as powder          F/u lipid panel

## 2011-07-07 ENCOUNTER — Ambulatory Visit (INDEPENDENT_AMBULATORY_CARE_PROVIDER_SITE_OTHER): Payer: 59

## 2011-07-07 DIAGNOSIS — J309 Allergic rhinitis, unspecified: Secondary | ICD-10-CM

## 2011-07-08 ENCOUNTER — Other Ambulatory Visit: Payer: Self-pay | Admitting: *Deleted

## 2011-07-08 MED ORDER — LEVOTHYROXINE SODIUM 88 MCG PO TABS: 88.0000 ug | ORAL_TABLET | Freq: Every day | ORAL | Status: DC

## 2011-07-13 ENCOUNTER — Encounter: Payer: Self-pay | Admitting: Internal Medicine

## 2011-07-13 ENCOUNTER — Ambulatory Visit (INDEPENDENT_AMBULATORY_CARE_PROVIDER_SITE_OTHER): Payer: 59

## 2011-07-13 ENCOUNTER — Ambulatory Visit (INDEPENDENT_AMBULATORY_CARE_PROVIDER_SITE_OTHER): Payer: 59 | Admitting: Internal Medicine

## 2011-07-13 VITALS — BP 130/70 | HR 73 | Ht 63.0 in | Wt 229.4 lb

## 2011-07-13 DIAGNOSIS — J301 Allergic rhinitis due to pollen: Secondary | ICD-10-CM

## 2011-07-13 DIAGNOSIS — J449 Chronic obstructive pulmonary disease, unspecified: Secondary | ICD-10-CM

## 2011-07-13 DIAGNOSIS — J309 Allergic rhinitis, unspecified: Secondary | ICD-10-CM

## 2011-07-13 NOTE — Patient Instructions (Signed)
OK to call for doxycycline if needed  Continue allergy vaccine  Get PFT done as planned.

## 2011-07-13 NOTE — Assessment & Plan Note (Signed)
Allergy vaccine has been successful and is continued

## 2011-07-13 NOTE — Progress Notes (Signed)
Subjective:    Patient ID: [name], female    DOB: [birth year], 65 y.o.   MRN: [id number]  HPI    Review of Systems     Objective:   Physical Exam        Assessment & Plan:   Subjective:    Patient ID: [name], female    DOB: May 04, 1946, 65 y.o.   MRN: [id number]  HPI 05/19/11- 38 yoF former heavy smoker followed for asthma/ COPD, hx food intolerance, allergic rhinitis, complicated by obesity, DM Last here November 20, 2010- note reviewed Allergy vaccine 1:10 GH Says she has been doing well. Spring pollen bothered nose and chest in March. Had some infection then, helped by doxycycline.  Reports no change in dry hacking cough, not relieved by cough syrup. Occasional, infrequent heart burn. CXR 02/08/11- [place]- lungs clear, ASVD We discussed updating PFT. She had smoked up to 3 PPD  07/13/11- 65 yoF former heavy smoker followed for asthma/ COPD, hx food intolerance, allergic rhinitis, complicated by obesity, DM Did not schedule PFT after last visit as intended, but has done so now.  She feels well. Remains off cigarettes.  Coughs only after using her inhaler- only a dry cough. No longer experiences shortness of breath.  Allergy vaccine- she feels they help her a lot. Went to ENT for ear pain- blamed throat congestion on reflux and Rx'd bid Nexium- "helps a lot".   Review of Systems Constitutional:   No-   weight loss, night sweats, fevers, chills, fatigue, lassitude. HEENT:   No-  headaches, difficulty swallowing, tooth/dental problems, sore throat,       No-  sneezing, itching, ear ache, nasal congestion, post nasal drip,  CV:  No-   chest pain, orthopnea, PND, swelling in lower extremities, anasarca, dizziness, palpitations Resp: No-   shortness of breath with exertion or at rest.              No-   productive cough,  No non-productive cough,  No-  coughing up of blood.              No-   change in color of mucus.  No- wheezing.   Skin: No-   rash or  lesions. GI:  No-   heartburn, indigestion, abdominal pain, nausea, vomiting, diarrhea,                 change in bowel habits, loss of appetite GU: No-   dysuria, change in color of urine, no urgency or frequency.  No- flank pain. MS:  No-   joint pain or swelling.  No- decreased range of motion.  No- back pain. Neuro- grossly normal to observation, Or:  Psych:  No- change in mood or affect. No depression or anxiety.  No memory loss.       Objective:   Physical Exam General- Alert, Oriented, Affect-appropriate, Distress- none acute    obese Skin- rash-none, lesions- none, excoriation- none Lymphadenopathy- none Head- atraumatic            Eyes- Gross vision intact, PERRLA, conjunctivae clear secretions            Ears- Hearing, canals normal            Nose- Clear, No- Septal dev, mucus, polyps, erosion, perforation             Throat- Mallampati III-IV , mucosa clear , drainage- none, tonsils- atrophic      dentures Neck- flexible , trachea midline,  no stridor , thyroid nl, carotid no bruit Chest - symmetrical excursion , unlabored           Heart/CV- RRR , no murmur , no gallop  , no rub, nl s1 s2                           - JVD- none , edema- none, stasis changes- none, varices- none           Lung- clear to P&A, wheeze- none, cough- none , dullness-none, rub- none           Chest wall-  Abd- tender-no, distended-no, bowel sounds-present, HSM- no Br/ Gen/ Rectal- Not done, not indicated Extrem- cyanosis- none, clubbing, none, atrophy- none, strength- nl Neuro- grossly intact to observation           Assessment & Plan:

## 2011-07-13 NOTE — Assessment & Plan Note (Addendum)
Clinically well controlled now. We will go forward with scheduling PFT. She has very few respiratory symptoms now despite a long history of heavy smoking in the past. We expect exertional dyspnea from her obesity and lack of conditioning. Pulmonary function testing will be important for the finding her pulmonary status.

## 2011-07-13 NOTE — Telephone Encounter (Signed)
Please advise if okay to give abx as requested. Thanks.

## 2011-07-16 ENCOUNTER — Other Ambulatory Visit: Payer: Self-pay | Admitting: Internal Medicine

## 2011-07-20 NOTE — Telephone Encounter (Signed)
Ok to Rx doxy

## 2011-07-24 ENCOUNTER — Other Ambulatory Visit: Payer: Self-pay | Admitting: Internal Medicine

## 2011-07-27 ENCOUNTER — Other Ambulatory Visit: Payer: Self-pay | Admitting: *Deleted

## 2011-07-27 MED ORDER — TIOTROPIUM BROMIDE MONOHYDRATE 18 MCG IN CAPS: 18.0000 ug | ORAL_CAPSULE | Freq: Every day | RESPIRATORY_TRACT | Status: DC

## 2011-07-27 MED ORDER — ALBUTEROL SULFATE HFA 108 (90 BASE) MCG/ACT IN AERS: 2.0000 | INHALATION_SPRAY | Freq: Four times a day (QID) | RESPIRATORY_TRACT | Status: DC | PRN

## 2011-07-27 MED ORDER — BUDESONIDE-FORMOTEROL FUMARATE 80-4.5 MCG/ACT IN AERO: 2.0000 | INHALATION_SPRAY | Freq: Two times a day (BID) | RESPIRATORY_TRACT | Status: DC

## 2011-07-27 MED ORDER — ALBUTEROL SULFATE (2.5 MG/3ML) 0.083% IN NEBU: 2.5000 mg | INHALATION_SOLUTION | Freq: Four times a day (QID) | RESPIRATORY_TRACT | Status: DC | PRN

## 2011-07-28 ENCOUNTER — Ambulatory Visit (INDEPENDENT_AMBULATORY_CARE_PROVIDER_SITE_OTHER): Payer: 59

## 2011-07-28 DIAGNOSIS — J309 Allergic rhinitis, unspecified: Secondary | ICD-10-CM

## 2011-08-03 ENCOUNTER — Other Ambulatory Visit: Payer: Self-pay | Admitting: Internal Medicine

## 2011-08-07 ENCOUNTER — Other Ambulatory Visit: Payer: Self-pay | Admitting: Internal Medicine

## 2011-08-07 NOTE — Telephone Encounter (Signed)
Please advise if okay to refill. Thanks.  

## 2011-08-10 ENCOUNTER — Ambulatory Visit (INDEPENDENT_AMBULATORY_CARE_PROVIDER_SITE_OTHER): Payer: 59

## 2011-08-10 DIAGNOSIS — J309 Allergic rhinitis, unspecified: Secondary | ICD-10-CM

## 2011-08-11 ENCOUNTER — Ambulatory Visit (INDEPENDENT_AMBULATORY_CARE_PROVIDER_SITE_OTHER): Payer: 59

## 2011-08-11 DIAGNOSIS — J309 Allergic rhinitis, unspecified: Secondary | ICD-10-CM

## 2011-08-11 NOTE — Telephone Encounter (Signed)
Ok to send doxy

## 2011-08-14 ENCOUNTER — Other Ambulatory Visit: Payer: Self-pay | Admitting: Internal Medicine

## 2011-08-14 ENCOUNTER — Ambulatory Visit (INDEPENDENT_AMBULATORY_CARE_PROVIDER_SITE_OTHER): Payer: 59 | Admitting: Internal Medicine

## 2011-08-14 ENCOUNTER — Other Ambulatory Visit (INDEPENDENT_AMBULATORY_CARE_PROVIDER_SITE_OTHER): Payer: 59

## 2011-08-14 ENCOUNTER — Ambulatory Visit (INDEPENDENT_AMBULATORY_CARE_PROVIDER_SITE_OTHER)
Admission: RE | Admit: 2011-08-14 | Discharge: 2011-08-14 | Disposition: A | Discharge status: Home / Self Care | Payer: 59 | Source: Ambulatory Visit | Attending: Internal Medicine | Admitting: Internal Medicine

## 2011-08-14 DIAGNOSIS — J449 Chronic obstructive pulmonary disease, unspecified: Secondary | ICD-10-CM

## 2011-08-14 DIAGNOSIS — E119 Type 2 diabetes mellitus without complications: Secondary | ICD-10-CM

## 2011-08-14 DIAGNOSIS — E039 Hypothyroidism, unspecified: Secondary | ICD-10-CM

## 2011-08-14 DIAGNOSIS — I10 Essential (primary) hypertension: Secondary | ICD-10-CM

## 2011-08-14 DIAGNOSIS — M79609 Pain in unspecified limb: Secondary | ICD-10-CM

## 2011-08-14 DIAGNOSIS — F341 Dysthymic disorder: Secondary | ICD-10-CM

## 2011-08-14 DIAGNOSIS — E785 Hyperlipidemia, unspecified: Secondary | ICD-10-CM

## 2011-08-14 DIAGNOSIS — M79601 Pain in right arm: Secondary | ICD-10-CM

## 2011-08-14 LAB — COMPREHENSIVE METABOLIC PANEL
ALT: 15 U/L (ref 0–35)
BUN: 16 mg/dL (ref 6–23)
CO2: 29 mEq/L (ref 19–32)
Calcium: 9.1 mg/dL (ref 8.4–10.5)
Chloride: 102 mEq/L (ref 96–112)
Creatinine, Ser: 0.7 mg/dL (ref 0.4–1.2)
GFR: 90.05 mL/min (ref >60.00)

## 2011-08-14 LAB — LDL CHOLESTEROL, DIRECT: Direct LDL: 130.1 mg/dL

## 2011-08-14 LAB — HEPATIC FUNCTION PANEL
ALT: 15 U/L (ref 0–35)
Bilirubin, Direct: 0.1 mg/dL (ref 0.0–0.3)
Total Protein: 6.5 g/dL (ref 6.0–8.3)

## 2011-08-14 LAB — LIPID PANEL
HDL: 47.3 mg/dL (ref >39.00)
Triglycerides: 203 mg/dL — ABNORMAL HIGH (ref 0.0–149.0)

## 2011-08-14 LAB — TSH: TSH: 6.17 u[IU]/mL — ABNORMAL HIGH (ref 0.35–5.50)

## 2011-08-14 MED ORDER — LEVOTHYROXINE SODIUM 88 MCG PO TABS: 88.0000 ug | ORAL_TABLET | Freq: Every day | ORAL | Status: DC

## 2011-08-14 MED ORDER — GABAPENTIN 300 MG PO CAPS: ORAL_CAPSULE | ORAL | Status: DC

## 2011-08-14 NOTE — Progress Notes (Signed)
  Subjective:    Patient ID: [name], female    DOB: [birth year], 65 y.o.   MRN: [id number]  HPI [name] presents for follow-up. Her chief complaint is pain in the right arm from shoulder to hand. No paresthesia or weakness. No limited to joint movement. No recent h/o cervical studies. Reviewed old records: MRI cervical spine Dec '09 - normal study. No subsequent studies. No h/o injury  DM- reports good CBGs at home. Last A1C 8.4 % July 11.  Blood pressure is good.   I have reviewed the patient's medical history in detail and updated the computerized patient record.    Review of Systems System review is negative for any constitutional, cardiac, pulmonary, GI or neuro symptoms or complaints     Objective:   Physical Exam Vitals noted Gen'l - overweight white female in no acute distress HEENT - nl Neck - full ROM w/o pain Chest- Clear, no rales or wheeze Cor - RRR Ext - nl ROM hand, wrist, elbow, shoulder. Neuro - nl MS right UE, nl DTRs, nbl sensation  Lab Results  Component Value Date   WBC 7.9 06/10/2011   HGB 12.7 06/10/2011   HCT 37.3 06/10/2011   PLT 247.0 06/10/2011   CHOL 214* 08/14/2011   TRIG 203.0* 08/14/2011   HDL 47.30 08/14/2011   LDLDIRECT 130.1 08/14/2011   ALT 15 08/14/2011   ALT 15 08/14/2011   AST 22 08/14/2011   AST 22 08/14/2011   NA 139 08/14/2011   K 3.5 08/14/2011   CL 102 08/14/2011   CREATININE 0.7 08/14/2011   BUN 16 08/14/2011   CO2 29 08/14/2011   TSH 6.17* 08/14/2011   HGBA1C 7.6* 08/14/2011           Assessment & Plan:  Right Arm pain - at risk for DDD with radicular symptoms. Findings at today's visit are not acute.  Plan - c-spine series-if markedly abnormal repeat MRI           Trail of gabapentin for pain control.  Addendum -  *RADIOLOGY REPORT*  Clinical Data: Right arm pain with possible cervical origin. No  trauma.  CERVICAL SPINE - 2-3 VIEW  Comparison: None.  Findings: AP, lateral, swimmers views. The lateral view is  motion  degraded. Images through bottom of C6. Maintenance of vertebral  body height alignment.  The attempted swimmer's view is nondiagnostic, motion degraded.  Minimal spondylosis for age. Prevertebral soft tissues are within  normal limits.  IMPRESSION:  Motion degraded exam, without gross abnormality through bottom of  C6.  Original Report Authenticated By: Consuello Bossier, M.D.  Findings do not require follow-up MRI unless her symptoms progress.

## 2011-08-16 NOTE — Assessment & Plan Note (Signed)
A1C 7.6% indicates need for better dietary compliance and more exercise but there is no need to change medications.

## 2011-08-16 NOTE — Assessment & Plan Note (Signed)
BP Readings from Last 3 Encounters:  08/14/11 142/78  07/13/11 130/70  07/03/11 118/70   BP is trending upward. She is on triple drug therapy: CCB, ARB and diuretic.  Plan - better life style mgt.          Follow-up in 3 months and if trend continues will need to adjust medications.

## 2011-08-16 NOTE — Assessment & Plan Note (Signed)
LDL @ 130 is above goal of 100 despite using welchol and zetia. She does have a statin intolerance.  Plan - continue present medications           Low fat diet with strict adherence.           Follow-up lab in 6 months.

## 2011-08-16 NOTE — Assessment & Plan Note (Signed)
Stable with no change in condition. No recent exacerbations or emotional issues.  Plan - renew medications

## 2011-08-16 NOTE — Assessment & Plan Note (Signed)
Stable on present regimen. She does report an increase in breakthrough breathing problems but no excessive use of inhalers. She is educated to call either IM or pulmonary for symptoms that require frequent use of rescue medication.   Plan - continue present regimen

## 2011-08-17 ENCOUNTER — Ambulatory Visit (INDEPENDENT_AMBULATORY_CARE_PROVIDER_SITE_OTHER): Payer: 59

## 2011-08-17 ENCOUNTER — Ambulatory Visit: Payer: 59 | Admitting: *Deleted

## 2011-08-17 DIAGNOSIS — Z111 Encounter for screening for respiratory tuberculosis: Secondary | ICD-10-CM

## 2011-08-17 DIAGNOSIS — J309 Allergic rhinitis, unspecified: Secondary | ICD-10-CM

## 2011-08-22 ENCOUNTER — Other Ambulatory Visit: Payer: Self-pay | Admitting: Internal Medicine

## 2011-08-24 ENCOUNTER — Other Ambulatory Visit: Payer: Self-pay | Admitting: *Deleted

## 2011-08-24 MED ORDER — METFORMIN HCL 1000 MG PO TABS: 1000.0000 mg | ORAL_TABLET | Freq: Two times a day (BID) | ORAL | Status: DC

## 2011-08-26 ENCOUNTER — Ambulatory Visit (INDEPENDENT_AMBULATORY_CARE_PROVIDER_SITE_OTHER): Payer: Medicaid Other | Admitting: Internal Medicine

## 2011-08-26 DIAGNOSIS — J449 Chronic obstructive pulmonary disease, unspecified: Secondary | ICD-10-CM

## 2011-08-26 LAB — PULMONARY FUNCTION TEST

## 2011-08-26 NOTE — Progress Notes (Signed)
PFT done today. 

## 2011-08-31 LAB — POCT CARDIAC MARKERS
Myoglobin, poc: 127
Myoglobin, poc: 150
Troponin i, poc: <0.05

## 2011-08-31 LAB — POCT I-STAT, CHEM 8
Chloride: 104
Glucose, Bld: 59 — ABNORMAL LOW
HCT: 38
Hemoglobin: 12.9
Potassium: 3.7
Sodium: 140

## 2011-08-31 LAB — LIPID PANEL
HDL: 32 — ABNORMAL LOW
Total CHOL/HDL Ratio: 5
Triglycerides: 120
VLDL: 24

## 2011-08-31 LAB — CARDIAC PANEL(CRET KIN+CKTOT+MB+TROPI)
CK, MB: 3
Relative Index: 2.5
Total CK: 115
Total CK: 120
Troponin I: 0.02
Troponin I: 0.02

## 2011-08-31 LAB — CK TOTAL AND CKMB (NOT AT ARMC)
CK, MB: 3.3
Total CK: 128

## 2011-08-31 LAB — GLUCOSE, CAPILLARY
Glucose-Capillary: 187 — ABNORMAL HIGH
Glucose-Capillary: 199 — ABNORMAL HIGH
Glucose-Capillary: 95

## 2011-08-31 LAB — TSH: TSH: 3.891

## 2011-09-01 ENCOUNTER — Other Ambulatory Visit: Payer: Self-pay | Admitting: Internal Medicine

## 2011-09-01 LAB — GLUCOSE, CAPILLARY
Glucose-Capillary: 47 mg/dL — ABNORMAL LOW (ref 70–99)
Glucose-Capillary: 62 mg/dL — ABNORMAL LOW (ref 70–99)
Glucose-Capillary: 80 mg/dL (ref 70–99)

## 2011-09-01 NOTE — Telephone Encounter (Signed)
Please advise 

## 2011-09-02 NOTE — Telephone Encounter (Signed)
Ok x 3  

## 2011-09-03 ENCOUNTER — Encounter: Payer: Self-pay | Admitting: Internal Medicine

## 2011-09-03 LAB — CBC
HCT: 34.6 % — ABNORMAL LOW (ref 36.0–46.0)
HCT: 35.3 % — ABNORMAL LOW (ref 36.0–46.0)
HCT: 37.3 % (ref 36.0–46.0)
Hemoglobin: 11.6 g/dL — ABNORMAL LOW (ref 12.0–15.0)
Hemoglobin: 11.9 g/dL — ABNORMAL LOW (ref 12.0–15.0)
Hemoglobin: 12.5 g/dL (ref 12.0–15.0)
MCHC: 33.8 g/dL (ref 30.0–36.0)
MCV: 86.2 fL (ref 78.0–100.0)
MCV: 86.6 fL (ref 78.0–100.0)
Platelets: 241 K/uL (ref 150–400)
RBC: 3.97 MIL/uL (ref 3.87–5.11)
RBC: 4.1 MIL/uL (ref 3.87–5.11)
RDW: 13.9 % (ref 11.5–15.5)
RDW: 14.2 % (ref 11.5–15.5)
WBC: 6.6 10*3/uL (ref 4.0–10.5)
WBC: 8.3 K/uL (ref 4.0–10.5)

## 2011-09-03 LAB — URINE CULTURE
Colony Count: NO GROWTH
Culture: NO GROWTH

## 2011-09-03 LAB — GLUCOSE, CAPILLARY
Glucose-Capillary: 156 mg/dL — ABNORMAL HIGH (ref 70–99)
Glucose-Capillary: 217 mg/dL — ABNORMAL HIGH (ref 70–99)
Glucose-Capillary: 217 mg/dL — ABNORMAL HIGH (ref 70–99)
Glucose-Capillary: 253 mg/dL — ABNORMAL HIGH (ref 70–99)
Glucose-Capillary: 268 mg/dL — ABNORMAL HIGH (ref 70–99)
Glucose-Capillary: 286 mg/dL — ABNORMAL HIGH (ref 70–99)
Glucose-Capillary: 323 mg/dL — ABNORMAL HIGH (ref 70–99)
Glucose-Capillary: 349 mg/dL — ABNORMAL HIGH (ref 70–99)
Glucose-Capillary: 377 mg/dL — ABNORMAL HIGH (ref 70–99)

## 2011-09-03 LAB — POCT I-STAT, CHEM 8
BUN: 29 mg/dL — ABNORMAL HIGH (ref 6–23)
Calcium, Ion: 1.23 mmol/L (ref 1.12–1.32)
Chloride: 103 mEq/L (ref 96–112)
Creatinine, Ser: 1 mg/dL (ref 0.4–1.2)
Glucose, Bld: 150 mg/dL — ABNORMAL HIGH (ref 70–99)

## 2011-09-03 LAB — BASIC METABOLIC PANEL
BUN: 14 mg/dL (ref 6–23)
CO2: 29 mEq/L (ref 19–32)
Chloride: 103 mEq/L (ref 96–112)
GFR calc non Af Amer: >60 mL/min (ref >60)
GFR calc non Af Amer: >60 mL/min (ref >60)
Glucose, Bld: 250 mg/dL — ABNORMAL HIGH (ref 70–99)
Glucose, Bld: 93 mg/dL (ref 70–99)
Potassium: 3.7 mEq/L (ref 3.5–5.1)
Potassium: 4.3 mEq/L (ref 3.5–5.1)
Sodium: 138 mEq/L (ref 135–145)
Sodium: 138 mEq/L (ref 135–145)

## 2011-09-03 LAB — DIFFERENTIAL
Eosinophils Absolute: 0 10*3/uL (ref 0.0–0.7)
Eosinophils Relative: 0 % (ref 0–5)
Lymphocytes Relative: 19 % (ref 12–46)
Lymphocytes Relative: 24 % (ref 12–46)
Lymphs Abs: 1.3 10*3/uL (ref 0.7–4.0)
Lymphs Abs: 1.4 10*3/uL (ref 0.7–4.0)
Monocytes Absolute: 0.6 10*3/uL (ref 0.1–1.0)
Monocytes Absolute: 0.7 10*3/uL (ref 0.1–1.0)
Monocytes Relative: 10 % (ref 3–12)
Neutro Abs: 4 10*3/uL (ref 1.7–7.7)

## 2011-09-03 LAB — URINALYSIS, ROUTINE W REFLEX MICROSCOPIC
Bilirubin Urine: NEGATIVE
Nitrite: NEGATIVE
Specific Gravity, Urine: 1.025 (ref 1.005–1.030)
pH: 5.5 (ref 5.0–8.0)

## 2011-09-03 LAB — HEMOGLOBIN A1C
Hgb A1c MFr Bld: 7.2 % — ABNORMAL HIGH (ref 4.6–6.1)
Mean Plasma Glucose: 160 mg/dL

## 2011-09-03 LAB — CLOSTRIDIUM DIFFICILE EIA
C difficile Toxins A+B, EIA: NEGATIVE
C difficile Toxins A+B, EIA: NEGATIVE

## 2011-09-03 LAB — OCCULT BLOOD X 1 CARD TO LAB, STOOL: Fecal Occult Bld: NEGATIVE

## 2011-09-03 LAB — FERRITIN: Ferritin: 19 ng/mL (ref 10–291)

## 2011-09-03 LAB — CULTURE, BLOOD (ROUTINE X 2)

## 2011-09-03 LAB — RETICULOCYTES
RBC.: 4.1 MIL/uL (ref 3.87–5.11)
Retic Count, Absolute: 53.3 K/uL (ref 19.0–186.0)
Retic Ct Pct: 1.3 % (ref 0.4–3.1)

## 2011-09-03 LAB — IRON AND TIBC
Iron: 38 ug/dL — ABNORMAL LOW (ref 42–135)
Saturation Ratios: 12 % — ABNORMAL LOW (ref 20–55)
TIBC: 310 ug/dL (ref 250–470)
UIBC: 272 ug/dL

## 2011-09-03 LAB — FOLATE: Folate: 15.2 ng/mL

## 2011-09-03 LAB — B-NATRIURETIC PEPTIDE (CONVERTED LAB): Pro B Natriuretic peptide (BNP): 60 pg/mL (ref 0.0–100.0)

## 2011-09-03 LAB — VITAMIN B12: Vitamin B-12: 281 pg/mL (ref 211–911)

## 2011-09-05 ENCOUNTER — Other Ambulatory Visit: Payer: Self-pay | Admitting: Internal Medicine

## 2011-09-09 ENCOUNTER — Other Ambulatory Visit: Payer: Self-pay | Admitting: *Deleted

## 2011-09-09 MED ORDER — COLESEVELAM HCL 3.75 G PO PACK: 1.0000 | PACK | Freq: Every day | ORAL | Status: DC

## 2011-09-10 LAB — POCT I-STAT CREATININE
Creatinine, Ser: 1.1
Operator id: 239701

## 2011-09-10 LAB — DIFFERENTIAL
Eosinophils Absolute: 0.1
Neutro Abs: 5.2
Neutrophils Relative %: 65

## 2011-09-10 LAB — CBC
MCHC: 33.2
Platelets: 280
RBC: 4.1
RDW: 13.6

## 2011-09-10 LAB — I-STAT 8, (EC8 V) (CONVERTED LAB)
BUN: 24 — ABNORMAL HIGH
Chloride: 103
Glucose, Bld: 235 — ABNORMAL HIGH
HCT: 40
pCO2, Ven: 40.7 — ABNORMAL LOW
pH, Ven: 7.431 — ABNORMAL HIGH

## 2011-09-10 LAB — CULTURE, BORDETELLA W/DFA-ST LAB

## 2011-09-11 ENCOUNTER — Ambulatory Visit (INDEPENDENT_AMBULATORY_CARE_PROVIDER_SITE_OTHER): Payer: Medicaid Other

## 2011-09-11 ENCOUNTER — Other Ambulatory Visit: Payer: Self-pay | Admitting: Internal Medicine

## 2011-09-11 DIAGNOSIS — J309 Allergic rhinitis, unspecified: Secondary | ICD-10-CM

## 2011-09-11 LAB — DIFFERENTIAL
Basophils Absolute: 0
Basophils Relative: 1
Basophils Relative: 1
Eosinophils Absolute: 0.1
Eosinophils Relative: 2
Monocytes Absolute: 0.5
Neutro Abs: 4.6
Neutrophils Relative %: 62

## 2011-09-11 LAB — CBC
Hemoglobin: 12.9
MCHC: 34.1
MCHC: 34.3
MCV: 87.8
Platelets: 279
RDW: 13

## 2011-09-11 LAB — I-STAT 8, (EC8 V) (CONVERTED LAB)
BUN: 18
Bicarbonate: 27.6 — ABNORMAL HIGH
Bicarbonate: 28 — ABNORMAL HIGH
Glucose, Bld: 143 — ABNORMAL HIGH
Glucose, Bld: 184 — ABNORMAL HIGH
HCT: 39
Hemoglobin: 13.3
Operator id: 272551
Operator id: 294521
Sodium: 139
TCO2: 29
pCO2, Ven: 41.8 — ABNORMAL LOW
pCO2, Ven: 44.2 — ABNORMAL LOW

## 2011-09-11 LAB — CULTURE, BLOOD (ROUTINE X 2): Culture: NO GROWTH

## 2011-09-11 NOTE — Telephone Encounter (Signed)
Please advise regarding RF request.  

## 2011-09-13 ENCOUNTER — Other Ambulatory Visit: Payer: Self-pay | Admitting: Internal Medicine

## 2011-09-13 NOTE — Telephone Encounter (Signed)
Ok for refill  x3 

## 2011-09-18 ENCOUNTER — Ambulatory Visit (INDEPENDENT_AMBULATORY_CARE_PROVIDER_SITE_OTHER): Payer: Medicare Other

## 2011-09-18 ENCOUNTER — Ambulatory Visit (INDEPENDENT_AMBULATORY_CARE_PROVIDER_SITE_OTHER): Payer: Medicare Other | Admitting: Internal Medicine

## 2011-09-18 ENCOUNTER — Encounter: Payer: Self-pay | Admitting: Internal Medicine

## 2011-09-18 VITALS — BP 132/68 | HR 73 | Ht 63.0 in | Wt 232.2 lb

## 2011-09-18 DIAGNOSIS — J301 Allergic rhinitis due to pollen: Secondary | ICD-10-CM

## 2011-09-18 DIAGNOSIS — J309 Allergic rhinitis, unspecified: Secondary | ICD-10-CM

## 2011-09-18 DIAGNOSIS — J449 Chronic obstructive pulmonary disease, unspecified: Secondary | ICD-10-CM

## 2011-09-18 MED ORDER — DOXYCYCLINE HYCLATE 100 MG PO TABS: ORAL_TABLET | ORAL | Status: DC

## 2011-09-18 NOTE — Progress Notes (Signed)
Patient ID: [name], female    DOB: 29-Jun-1946, 65 y.o.   MRN: [id number]  HPI 05/19/11- 58 yoF former heavy smoker followed for asthma/ COPD, hx food intolerance, allergic rhinitis, complicated by obesity, DM Last here November 20, 2010- note reviewed Allergy vaccine 1:10 GH Says she has been doing well. Spring pollen bothered nose and chest in March. Had some infection then, helped by doxycycline.  Reports no change in dry hacking cough, not relieved by cough syrup. Occasional, infrequent heart burn. CXR 02/08/11- [place]- lungs clear, ASVD We discussed updating PFT. She had smoked up to 3 PPD  07/13/11- 65 yoF former heavy smoker followed for asthma/ COPD, hx food intolerance, allergic rhinitis, complicated by obesity, DM Did not schedule PFT after last visit as intended, but has done so now.  She feels well. Remains off cigarettes.  Coughs only after using her inhaler- only a dry cough. No longer experiences shortness of breath.  Allergy vaccine- she feels they help her a lot. Went to ENT for ear pain- blamed throat congestion on reflux and Rx'd bid Nexium- "helps a lot".   09/18/11-  65 yoF former heavy smoker followed for asthma/ COPD, hx food intolerance, allergic rhinitis, complicated by obesity, DM Has had flu vaccine. She had done fairly well since here in August. Last night she had onset of dry cough and head congestion without fever or sore throat. Intermittent pain in the right side of the neck at the angle of the jaw comes and goes. It is not associated with popping, vertigo, pain on chewing or altered hearing. History of smoking 3 packs per day. PFT 08/26/2011-FEV1 1.45/76%, FEV1/FVC 0.7 no, FEF 25-75% was 35% of predicted with slight response to bronchodilator. TLC 100% RV 131% DLCO 54%. Moderate obstruction with slight response to bronchodilator and air trapping. This is probably mostly some emphysema.  CXR 01/29/2011-atherosclerosis with clear lung fields.  Review of  Systems- see HPI Constitutional:   No-   weight loss, night sweats, fevers, chills, fatigue, lassitude. HEENT:   No-  headaches, difficulty swallowing, tooth/dental problems, sore throat,       No-  sneezing, itching, ear ache, nasal congestion, post nasal drip,  CV:  No-   chest pain, orthopnea, PND, swelling in lower extremities, anasarca, dizziness, palpitations Resp: Some  shortness of breath with exertion or at rest.              No-   productive cough,  + non-productive cough,  No-  coughing up of blood.              No-   change in color of mucus.  No- wheezing.   Skin: No-   rash or lesions. GI:  No-   heartburn, indigestion, abdominal pain, nausea, vomiting, diarrhea,                 change in bowel habits, loss of appetite GU: No-   dysuria, change in color of urine, no urgency or frequency.  No- flank pain. MS:  No-   joint pain or swelling.  No- decreased range of motion.  No- back pain. Neuro- grossly normal to observation, Or:  Psych:  No- change in mood or affect. No depression or anxiety.  No memory loss.       Objective:   Physical Exam General- Alert, Oriented, Affect-appropriate, Distress- none acute    obese Skin- rash-none, lesions- none, excoriation- none Lymphadenopathy- none Head- atraumatic  Eyes- Gross vision intact, PERRLA, conjunctivae clear secretions            Ears- Hearing, canals- normal            Nose- Clear, No- Septal dev, mucus, polyps, erosion, perforation             Throat- Mallampati III , mucosa clear , drainage- none, tonsils- atrophic      dentures Neck- flexible , trachea midline, no stridor , thyroid nl, carotid no bruit, nontender Chest - symmetrical excursion , unlabored           Heart/CV- RRR , no murmur , no gallop  , no rub, nl s1 s2                           - JVD- none , edema- none, stasis changes- none, varices- none           Lung- trace crackle in the bases , wheeze- none, cough- none , dullness-none, rub- none            Chest wall-  Abd- tender-no, distended-no, bowel sounds-present, HSM- no Br/ Gen/ Rectal- Not done, not indicated Extrem- cyanosis- none, clubbing, none, atrophy- none, strength- nl Neuro- grossly intact to observation

## 2011-09-18 NOTE — Patient Instructions (Signed)
Continue allergy vaccine  Script sent for antibiotic to hold unless needed

## 2011-09-20 NOTE — Assessment & Plan Note (Signed)
She is satisfied allergy vaccine is helping and intends to continue. We discussed goals.

## 2011-09-20 NOTE — Assessment & Plan Note (Signed)
Weight loss and exercise for improved endurance would make significantly more difference then medication. She has a minor acute bronchitis now, viral or related to whether change. She is going to manage symptomatically but have given her doxycycline to hold because of the weekend.

## 2011-09-21 ENCOUNTER — Encounter: Payer: Self-pay | Admitting: Internal Medicine

## 2011-09-21 ENCOUNTER — Ambulatory Visit: Payer: Medicaid Other | Admitting: Internal Medicine

## 2011-09-21 DIAGNOSIS — Z0289 Encounter for other administrative examinations: Secondary | ICD-10-CM

## 2011-09-30 ENCOUNTER — Encounter: Payer: Self-pay | Admitting: Internal Medicine

## 2011-09-30 ENCOUNTER — Ambulatory Visit (INDEPENDENT_AMBULATORY_CARE_PROVIDER_SITE_OTHER): Payer: Medicare Other | Admitting: Internal Medicine

## 2011-09-30 ENCOUNTER — Other Ambulatory Visit: Payer: Self-pay | Admitting: *Deleted

## 2011-09-30 ENCOUNTER — Other Ambulatory Visit: Payer: Self-pay | Admitting: Internal Medicine

## 2011-09-30 ENCOUNTER — Other Ambulatory Visit (INDEPENDENT_AMBULATORY_CARE_PROVIDER_SITE_OTHER): Payer: Medicare Other

## 2011-09-30 ENCOUNTER — Ambulatory Visit (INDEPENDENT_AMBULATORY_CARE_PROVIDER_SITE_OTHER): Payer: Medicare Other

## 2011-09-30 DIAGNOSIS — E039 Hypothyroidism, unspecified: Secondary | ICD-10-CM

## 2011-09-30 DIAGNOSIS — I10 Essential (primary) hypertension: Secondary | ICD-10-CM

## 2011-09-30 DIAGNOSIS — E119 Type 2 diabetes mellitus without complications: Secondary | ICD-10-CM

## 2011-09-30 DIAGNOSIS — E785 Hyperlipidemia, unspecified: Secondary | ICD-10-CM

## 2011-09-30 DIAGNOSIS — J309 Allergic rhinitis, unspecified: Secondary | ICD-10-CM

## 2011-09-30 DIAGNOSIS — J449 Chronic obstructive pulmonary disease, unspecified: Secondary | ICD-10-CM

## 2011-09-30 LAB — LIPID PANEL
Cholesterol: 203 mg/dL — ABNORMAL HIGH (ref 0–200)
HDL: 47.9 mg/dL (ref >39.00)
Triglycerides: 358 mg/dL — ABNORMAL HIGH (ref 0.0–149.0)
VLDL: 71.6 mg/dL — ABNORMAL HIGH (ref 0.0–40.0)

## 2011-09-30 LAB — HEPATIC FUNCTION PANEL
ALT: 15 U/L (ref 0–35)
Albumin: 3.8 g/dL (ref 3.5–5.2)
Bilirubin, Direct: 0 mg/dL (ref 0.0–0.3)
Total Protein: 7.1 g/dL (ref 6.0–8.3)

## 2011-09-30 LAB — TSH: TSH: 4.79 u[IU]/mL (ref 0.35–5.50)

## 2011-09-30 LAB — HEMOGLOBIN A1C: Hgb A1c MFr Bld: 7.9 % — ABNORMAL HIGH (ref 4.6–6.5)

## 2011-09-30 LAB — LDL CHOLESTEROL, DIRECT: Direct LDL: 118.7 mg/dL

## 2011-09-30 MED ORDER — VALSARTAN 160 MG PO TABS: 160.0000 mg | ORAL_TABLET | Freq: Every day | ORAL | Status: DC

## 2011-09-30 NOTE — Assessment & Plan Note (Signed)
BP Readings from Last 3 Encounters:  09/30/11 160/80  09/18/11 132/68  08/14/11 142/78   Generally OK control, a little high today. No change in medications.

## 2011-09-30 NOTE — Assessment & Plan Note (Signed)
Very stable with clear lungs and normal respirations

## 2011-09-30 NOTE — Assessment & Plan Note (Signed)
Lab Results  Component Value Date   HGBA1C 7.6* 08/14/2011   Having repeat lab today - will adjust meds if indicated

## 2011-09-30 NOTE — Assessment & Plan Note (Signed)
Last LDL above goal of 100 and she was started on Welchol. For follow-up lab with recommendations to follow.

## 2011-09-30 NOTE — Progress Notes (Signed)
  Subjective:    Patient ID: [name], female    DOB: 06/03/1946, 65 y.o.   MRN: [id number]  HPI Ms. Kibble presents for follow-up of hypertension, diabetes, hypothyroid disease and hyperlipidemia. She has started on welchol and had a change in her thyroid dose. She has been feeling well and has had no adverse effects from her medication.  I have reviewed the patient's medical history in detail and updated the computerized patient record.    Review of Systems System review is negative for any constitutional, cardiac, pulmonary, GI or neuro symptoms or complaints other than as described in the HPI.     Objective:   Physical Exam Vitals noted - mild elevation in BP but she has been out of diovan Gen'l - overweight white woman in no distress Chest clear Cor - 2+ radial pulse, RRR          Assessment & Plan:

## 2011-10-01 ENCOUNTER — Other Ambulatory Visit: Payer: Self-pay | Admitting: Internal Medicine

## 2011-10-01 ENCOUNTER — Encounter: Payer: Self-pay | Admitting: Internal Medicine

## 2011-10-01 DIAGNOSIS — E785 Hyperlipidemia, unspecified: Secondary | ICD-10-CM

## 2011-10-01 DIAGNOSIS — E119 Type 2 diabetes mellitus without complications: Secondary | ICD-10-CM

## 2011-10-15 ENCOUNTER — Ambulatory Visit (INDEPENDENT_AMBULATORY_CARE_PROVIDER_SITE_OTHER): Payer: Medicare Other

## 2011-10-15 DIAGNOSIS — J309 Allergic rhinitis, unspecified: Secondary | ICD-10-CM

## 2011-10-16 ENCOUNTER — Other Ambulatory Visit: Payer: Self-pay | Admitting: Internal Medicine

## 2011-10-29 ENCOUNTER — Ambulatory Visit (INDEPENDENT_AMBULATORY_CARE_PROVIDER_SITE_OTHER): Payer: Medicare Other

## 2011-10-29 DIAGNOSIS — J309 Allergic rhinitis, unspecified: Secondary | ICD-10-CM

## 2011-11-02 ENCOUNTER — Other Ambulatory Visit: Payer: Self-pay | Admitting: Internal Medicine

## 2011-11-06 ENCOUNTER — Telehealth: Payer: Self-pay | Admitting: Internal Medicine

## 2011-11-06 DIAGNOSIS — Z91040 Latex allergy status: Secondary | ICD-10-CM

## 2011-11-06 NOTE — Telephone Encounter (Signed)
Spoke with pt. She states that she is scheduled to have cataract extraction done on 11/11/11 and her eye doc states she has to have test done to prove her latex allergy before the surgery is done. 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 She states needs this done asap. Please advise, thanks!

## 2011-11-06 NOTE — Telephone Encounter (Signed)
Ok per CY to order Latex allergy lab.  Lab ordered and had to leave a message on voicemail to let patient know this when she calls back-lab takes about 1 day to get results so she needs to come today and have drawn so we can have results by Tuesday at the latest.

## 2011-11-08 NOTE — Telephone Encounter (Signed)
Ok for refill x 5 

## 2011-11-09 ENCOUNTER — Other Ambulatory Visit: Payer: Self-pay | Admitting: *Deleted

## 2011-11-09 ENCOUNTER — Other Ambulatory Visit: Payer: Medicare Other

## 2011-11-09 DIAGNOSIS — Z91040 Latex allergy status: Secondary | ICD-10-CM

## 2011-11-09 NOTE — Telephone Encounter (Signed)
Spoke with pt and she is aware to come to lab for latex testing. She states will come in to have this done this am.

## 2011-11-10 ENCOUNTER — Other Ambulatory Visit: Payer: Self-pay | Admitting: Internal Medicine

## 2011-11-10 LAB — LATEX, IGE: Latex: <0.1 kU/L (ref <0.35)

## 2011-11-19 ENCOUNTER — Other Ambulatory Visit: Payer: Self-pay | Admitting: Internal Medicine

## 2011-11-20 ENCOUNTER — Ambulatory Visit (INDEPENDENT_AMBULATORY_CARE_PROVIDER_SITE_OTHER): Payer: Medicare Other

## 2011-11-20 DIAGNOSIS — J309 Allergic rhinitis, unspecified: Secondary | ICD-10-CM

## 2011-11-23 ENCOUNTER — Telehealth: Payer: Self-pay | Admitting: Internal Medicine

## 2011-11-23 MED ORDER — DOXYCYCLINE HYCLATE 100 MG PO TABS: ORAL_TABLET | ORAL | Status: DC

## 2011-11-23 NOTE — Telephone Encounter (Signed)
lmomtcb  

## 2011-11-23 NOTE — Telephone Encounter (Signed)
Per CY ok to call in the doxy 100 mg # 8   pt aware

## 2011-11-23 NOTE — Telephone Encounter (Signed)
Pt called me back and she is requesting refill of doxycycline---pt c/o sneezing, nasal congestion that is yellow in color, headahce, body aches.  Pt stated that she just keeps getting sick.  CY please advise.  Thanks  Allergies  Allergen Reactions  . Aspirin   . Codeine   . Latex   . Lisinopril     REACTION: causes cough  . Meperidine Hcl

## 2011-12-02 ENCOUNTER — Ambulatory Visit (INDEPENDENT_AMBULATORY_CARE_PROVIDER_SITE_OTHER): Payer: Medicare Other

## 2011-12-02 DIAGNOSIS — J309 Allergic rhinitis, unspecified: Secondary | ICD-10-CM

## 2011-12-11 ENCOUNTER — Ambulatory Visit (INDEPENDENT_AMBULATORY_CARE_PROVIDER_SITE_OTHER): Payer: Medicare Other

## 2011-12-11 DIAGNOSIS — Z23 Encounter for immunization: Secondary | ICD-10-CM

## 2011-12-11 DIAGNOSIS — J309 Allergic rhinitis, unspecified: Secondary | ICD-10-CM

## 2011-12-17 ENCOUNTER — Other Ambulatory Visit: Payer: Self-pay | Admitting: Internal Medicine

## 2011-12-17 NOTE — Telephone Encounter (Signed)
Ok to send refill  

## 2011-12-17 NOTE — Telephone Encounter (Signed)
Please advise if okay to send refill. Thanks.

## 2011-12-21 ENCOUNTER — Other Ambulatory Visit: Payer: Self-pay

## 2011-12-21 ENCOUNTER — Ambulatory Visit (INDEPENDENT_AMBULATORY_CARE_PROVIDER_SITE_OTHER): Payer: Medicare Other

## 2011-12-21 DIAGNOSIS — J309 Allergic rhinitis, unspecified: Secondary | ICD-10-CM

## 2011-12-21 MED ORDER — AZELASTINE HCL 0.1 % NA SOLN: 1.0000 | Freq: Every day | NASAL | Status: DC

## 2011-12-24 ENCOUNTER — Other Ambulatory Visit: Payer: Self-pay

## 2011-12-24 MED ORDER — FUROSEMIDE 40 MG PO TABS: 40.0000 mg | ORAL_TABLET | Freq: Every day | ORAL | Status: DC

## 2012-01-01 ENCOUNTER — Ambulatory Visit (INDEPENDENT_AMBULATORY_CARE_PROVIDER_SITE_OTHER): Payer: Medicare Other

## 2012-01-01 ENCOUNTER — Telehealth: Payer: Self-pay | Admitting: Internal Medicine

## 2012-01-01 DIAGNOSIS — J309 Allergic rhinitis, unspecified: Secondary | ICD-10-CM

## 2012-01-01 NOTE — Telephone Encounter (Signed)
I have given this to [place]. [name]

## 2012-01-01 NOTE — Telephone Encounter (Signed)
ATC patient several times to let her know that Placard at front for pick up.

## 2012-01-01 NOTE — Telephone Encounter (Signed)
Message and placard on CY's cart.

## 2012-01-04 NOTE — Telephone Encounter (Signed)
Notified pt that placard is here for her to pick up.  Pt stated she will like to pick this up on her next appt w/ CY on 2/13.  Pt stated nothing further needed at this time.  Antionette Fairy

## 2012-01-04 NOTE — Telephone Encounter (Signed)
ATC, NA, no voicemail. [name], CMA  

## 2012-01-08 ENCOUNTER — Ambulatory Visit (INDEPENDENT_AMBULATORY_CARE_PROVIDER_SITE_OTHER): Payer: Medicare Other

## 2012-01-08 DIAGNOSIS — J309 Allergic rhinitis, unspecified: Secondary | ICD-10-CM

## 2012-01-12 ENCOUNTER — Ambulatory Visit (INDEPENDENT_AMBULATORY_CARE_PROVIDER_SITE_OTHER): Payer: Medicare Other

## 2012-01-12 ENCOUNTER — Other Ambulatory Visit (INDEPENDENT_AMBULATORY_CARE_PROVIDER_SITE_OTHER): Payer: Medicare Other

## 2012-01-12 ENCOUNTER — Other Ambulatory Visit: Payer: Self-pay | Admitting: *Deleted

## 2012-01-12 ENCOUNTER — Encounter: Payer: Self-pay | Admitting: Internal Medicine

## 2012-01-12 ENCOUNTER — Ambulatory Visit (INDEPENDENT_AMBULATORY_CARE_PROVIDER_SITE_OTHER): Payer: Medicare Other | Admitting: Internal Medicine

## 2012-01-12 DIAGNOSIS — D509 Iron deficiency anemia, unspecified: Secondary | ICD-10-CM

## 2012-01-12 DIAGNOSIS — R5383 Other fatigue: Secondary | ICD-10-CM

## 2012-01-12 DIAGNOSIS — I10 Essential (primary) hypertension: Secondary | ICD-10-CM

## 2012-01-12 DIAGNOSIS — R5381 Other malaise: Secondary | ICD-10-CM

## 2012-01-12 DIAGNOSIS — R17 Unspecified jaundice: Secondary | ICD-10-CM

## 2012-01-12 DIAGNOSIS — J309 Allergic rhinitis, unspecified: Secondary | ICD-10-CM

## 2012-01-12 DIAGNOSIS — E538 Deficiency of other specified B group vitamins: Secondary | ICD-10-CM

## 2012-01-12 DIAGNOSIS — E119 Type 2 diabetes mellitus without complications: Secondary | ICD-10-CM

## 2012-01-12 DIAGNOSIS — E039 Hypothyroidism, unspecified: Secondary | ICD-10-CM

## 2012-01-12 LAB — COMPREHENSIVE METABOLIC PANEL
Alkaline Phosphatase: 87 U/L (ref 39–117)
BUN: 20 mg/dL (ref 6–23)
CO2: 26 mEq/L (ref 19–32)
GFR: 68.83 mL/min (ref >60.00)
Glucose, Bld: 168 mg/dL — ABNORMAL HIGH (ref 70–99)
Total Bilirubin: 0.6 mg/dL (ref 0.3–1.2)
Total Protein: 6.6 g/dL (ref 6.0–8.3)

## 2012-01-12 LAB — CBC WITH DIFFERENTIAL/PLATELET
Basophils Relative: 0.6 % (ref 0.0–3.0)
Eosinophils Relative: 1 % (ref 0.0–5.0)
HCT: 38.7 % (ref 36.0–46.0)
Hemoglobin: 12.8 g/dL (ref 12.0–15.0)
Lymphs Abs: 1.7 10*3/uL (ref 0.7–4.0)
MCV: 88.8 fl (ref 78.0–100.0)
Monocytes Absolute: 0.4 10*3/uL (ref 0.1–1.0)
Monocytes Relative: 6 % (ref 3.0–12.0)
Neutro Abs: 5.1 10*3/uL (ref 1.4–7.7)
RBC: 4.36 Mil/uL (ref 3.87–5.11)
WBC: 7.4 10*3/uL (ref 4.5–10.5)

## 2012-01-12 LAB — HEPATIC FUNCTION PANEL
AST: 19 U/L (ref 0–37)
Albumin: 3.6 g/dL (ref 3.5–5.2)
Alkaline Phosphatase: 87 U/L (ref 39–117)
Total Bilirubin: 0.6 mg/dL (ref 0.3–1.2)

## 2012-01-12 LAB — SEDIMENTATION RATE: Sed Rate: 11 mm/hr (ref 0–22)

## 2012-01-12 LAB — TSH: TSH: 3.6 u[IU]/mL (ref 0.35–5.50)

## 2012-01-12 MED ORDER — INSULIN GLARGINE 100 UNIT/ML ~~LOC~~ SOLN: 40.0000 [IU] | Freq: Every day | SUBCUTANEOUS | Status: DC

## 2012-01-12 NOTE — Patient Instructions (Signed)
Jaundice -  You do not appear yellow on exam and the eyes are not jaundiced. Plan - recheck liver function.  Malaise and fatigue - will check blood counts, B12, thyroid, sugar.  Temporomandibular Joint Pain Your exam shows that you have a problem with your temporomandibular joint (TMJ), the joint that moves when you open your mouth or chew food. TMJ problems can result from direct injuries, bite abnormalities, or tension states which cause you to grind or clench your teeth. Typical symptoms include pain around the joint, clicking, restricted movement, and headaches. The TMJ is like any other joint in the body; when it is strained, it needs rest to repair itself. To keep the joint at rest it is important that you do not open your mouth wider than the width of your index finger. If you must yawn, be sure to support your chin with your hand so your mouth does not open wide. Eat a soft diet (nothing firmer than ground beef, no raw vegetables), do not chew gum and do not talk if it causes you pain. Apply topical heat by using a warm, moist cloth placed in front of the ear for 15 to 20 minutes several times daily. Alternating heat and ice may give even more relief. Anti-inflammatory pain medicine and muscle relaxants can also be helpful. A dental orthotic or splint may be used for temporary relief. Long-term problems may require treatment for stress as well as braces or surgery. Please check with your doctor or dentist if your symptoms do not improve within one week. Document Released: 12/24/2004 Document Revised: 07/29/2011 Document Reviewed: 11/16/2005 [facility] Patient Information 2012 [place], [place].  Barotitis Media Barotitis media is soreness (inflammation) of the area behind the eardrum (middle ear). This occurs when the auditory tube (Eustachian tube) leading from the back of the throat to the eardrum is blocked. When it is blocked air cannot move in and out of the middle ear to equalize pressure  changes. These pressure changes come from changes in altitude when:  Flying.     Driving in the mountains.     Diving.  Problems are more likely to occur with pressure changes during times when you are congested as from:  Hay fever.     Upper respiratory infection.     A cold.  Damage or hearing loss (barotrauma) caused by this may be permanent. HOME CARE INSTRUCTIONS    Use medicines as recommended by your caregiver. Over the counter medicines will help unblock the canal and can help during times of air travel.     Do not put anything into your ears to clean or unplug them. Eardrops will not be helpful.     Do not swim, dive, or fly until your caregiver says it is all right to do so. If these activities are necessary, chewing gum with frequent swallowing may help. It is also helpful to hold your nose and gently blow to pop your ears for equalizing pressure changes. This forces air into the Eustachian tube.     For little ones with problems, give your baby a bottle of water or juice during periods when pressure changes would be anticipated such as during take offs and landings associated with air travel.     Only take over-the-counter or prescription medicines for pain, discomfort, or fever as directed by your caregiver.     A decongestant may be helpful in de-congesting the middle ear and make pressure equalization easier. This can be even more effective if the  drops (spray) are delivered with the head lying over the edge of a bed with the head tilted toward the ear on the affected side.     If your caregiver has given you a follow-up appointment, it is very important to keep that appointment. Not keeping the appointment could result in a chronic or permanent injury, pain, hearing loss and disability. If there is any problem keeping the appointment, you must call back to this facility for assistance.  SEEK IMMEDIATE MEDICAL CARE IF:    You develop a severe headache, dizziness, severe  ear pain, or bloody or pus-like drainage from your ears.     An oral temperature above 102 F (38.9 C) develops.     Your problems do not improve or become worse.  MAKE SURE YOU:    Understand these instructions.     Will watch your condition.     Will get help right away if you are not doing well or get worse.  Document Released: 11/13/2000 Document Revised: 07/29/2011 Document Reviewed: 06/21/2008 [facility] Patient Information 2012 [place], [place].

## 2012-01-12 NOTE — Progress Notes (Signed)
Subjective:    Patient ID: [name], female    DOB: [birth year], 66 y.o.   MRN: [id number]  HPI [name] presents for the evaluation of jaundice: she has noted that her skin appears sallow - yellowish. She has had no RUQ pain. No new foods, no travel and no new medicine.   She complains of generalized abdominal pain - a chronic problem  She has had carpal tunnel release right hand. 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 Dr. Teressa Senter is ruling out gout.  She has pain in the submental lypmh nodes right.   Past Medical History  Diagnosis Date  . Personal history of colonic polyps   . Iron deficiency anemia, unspecified   . Dizziness   . Other malaise and fatigue   . Chest pain, unspecified   . Other B-complex deficiencies   . Arthritis     Right Sacroiliac joint  . Other and unspecified hyperlipidemia   . Anxiety and depression   . HTN (hypertension)   . Unspecified hypothyroidism   . Type II or unspecified type diabetes mellitus without mention of complication, not stated as uncontrolled   . Allergic rhinitis, cause unspecified   . Unspecified asthma   . Chronic airway obstruction, not elsewhere classified   . Abnormal stress test 08/21/2008    Neg  . GERD (gastroesophageal reflux disease)    Past Surgical History  Procedure Date  . Foot surgery     hammer toe 2004, 2005  . Abdominal hysterectomy 1982    menometorrhagia  . Carpal tunnel release 03/05/2009    bilateral   Family History  Problem Relation Age of Onset  . Hypertension Mother   . Diabetes Mother   . Heart disease Mother     CHF  . Coronary artery disease Mother   . Lung cancer Father   . Hypertension Father   . Diabetes Father   . Liver cancer Father     w/mets  . Prostate cancer Father   . Breast cancer Sister   . Lung disease Brother     Agent Orange Related  . Asthma Brother   . Allergies Brother   . Colon cancer Neg Hx    History   Social History  .  Marital Status: Legally Separated    Spouse Name: N/A    Number of Children: 6  . Years of Education: N/A   Occupational History  .     Social History Main Topics  . Smoking status: Former Games developer  . Smokeless tobacco: Not on file   Comment: Quit 9 years ago.  . Alcohol Use: No  . Drug Use: No  . Sexually Active: Not on file   Other Topics Concern  . Not on file   Social History Narrative   10th gradeMarried - 1965, Divorced after 5 years; married 1971-divorced 2 yrs; married [id number] sons - '68, '71, '79; 3 daughters - '69, '71, '79Grandchildren 10; 2 great-grandchildrenDisability - was a CNA, disability ended with Medicare/retirement. Looking for work but can't find a jobEnvironment: House with crawl space, central air conditioning, hard wood. No feather bedding, no mold. Son smokes. Pets including dogs, cats, 3 birds. Angioedema with Aspirin.Daily caffeine use two cups a dayRegular Exercise-yes       Review of Systems System review is negative for any constitutional, cardiac, pulmonary, GI or neuro symptoms or complaints other than as described in the HPI.    Objective:   Physical Exam Filed Vitals:  01/12/12 1112  BP: 130/78  Pulse: 80  Temp: 97.6 F (36.4 C)  Resp: 16   Gen'l- overweight white woman in no distress, does not appear yellow HEENT - EACs/TMs normal, tender at the right TMJ Nodes - no adenopathy noted in the submandibular or cervical area right Cor - RRR Abd - obese, no masses, no tenderness RUQ  Lab Results  Component Value Date   WBC 7.4 01/12/2012   HGB 12.8 01/12/2012   HCT 38.7 01/12/2012   PLT 275.0 01/12/2012   GLUCOSE 168* 01/12/2012   CHOL 203* 09/30/2011   TRIG 358.0* 09/30/2011   HDL 47.90 09/30/2011   LDLDIRECT 118.7 09/30/2011   LDLCALC 68 12/19/2010        ALT 15 01/12/2012   AST 19 01/12/2012        NA 140 01/12/2012   K 4.3 01/12/2012   CL 105 01/12/2012   CREATININE 0.9 01/12/2012   BUN 20 01/12/2012   CO2 26 01/12/2012   TSH 3.60  01/12/2012   HGBA1C 8.3* 01/12/2012        Assessment & Plan:  Jaundice - exam is normal and lab reveals normal liver function.

## 2012-01-12 NOTE — Telephone Encounter (Signed)
Done

## 2012-01-13 ENCOUNTER — Ambulatory Visit: Payer: 59 | Admitting: Internal Medicine

## 2012-01-13 NOTE — Assessment & Plan Note (Signed)
Lab Results  Component Value Date   TSH 3.60 01/12/2012   Normal range - not a cause of fatigue

## 2012-01-13 NOTE — Assessment & Plan Note (Signed)
B12 = 153 which is low.  Plan - resume B12 replacment with monthly injections - 1,000 mcg

## 2012-01-13 NOTE — Assessment & Plan Note (Signed)
Lab Results  Component Value Date   HGB 12.8 01/12/2012   Excellent - not a cause of fatigue

## 2012-01-13 NOTE — Assessment & Plan Note (Signed)
BP Readings from Last 3 Encounters:  01/12/12 130/78  09/30/11 160/80  09/18/11 132/68   Generally adequate control.  Plan- continue present medications.

## 2012-01-13 NOTE — Assessment & Plan Note (Signed)
Exam is unremarkable and lab results are in normal range except for B12.  Plan- no new treatments          Continue chronic disease management          Replace B12

## 2012-01-13 NOTE — Assessment & Plan Note (Signed)
Lab Results  Component Value Date   HGBA1C 8.3* 01/12/2012   Too high.   Plan - she will continue on present regimen but will be asked to monitor AM fasting CBG with an eye to maximizing effect of basal insulin therapy.           Redoubled effort on diet.

## 2012-01-17 ENCOUNTER — Encounter: Payer: Self-pay | Admitting: Internal Medicine

## 2012-01-28 NOTE — Progress Notes (Signed)
Quick Note:  LMTCB ______ 

## 2012-02-03 ENCOUNTER — Other Ambulatory Visit: Payer: Self-pay | Admitting: *Deleted

## 2012-02-03 NOTE — Progress Notes (Signed)
Quick Note:  Pt was made aware of these results already. ______

## 2012-02-03 NOTE — Telephone Encounter (Signed)
Venlafaxine request [last refill

## 2012-02-05 ENCOUNTER — Ambulatory Visit: Payer: Self-pay | Admitting: Internal Medicine

## 2012-02-05 ENCOUNTER — Ambulatory Visit (INDEPENDENT_AMBULATORY_CARE_PROVIDER_SITE_OTHER): Payer: Medicare Other

## 2012-02-05 DIAGNOSIS — J309 Allergic rhinitis, unspecified: Secondary | ICD-10-CM

## 2012-02-08 NOTE — Telephone Encounter (Signed)
Venlafaxine requested [last refill 08.03.2012 #60 x11 refills]; pharmacy confirmed active refills on Rx.

## 2012-02-12 ENCOUNTER — Ambulatory Visit (INDEPENDENT_AMBULATORY_CARE_PROVIDER_SITE_OTHER): Payer: Medicare Other | Admitting: Internal Medicine

## 2012-02-12 ENCOUNTER — Emergency Department (HOSPITAL_COMMUNITY): Payer: Medicare Other

## 2012-02-12 ENCOUNTER — Ambulatory Visit (INDEPENDENT_AMBULATORY_CARE_PROVIDER_SITE_OTHER): Payer: Medicare Other

## 2012-02-12 ENCOUNTER — Other Ambulatory Visit: Payer: Self-pay

## 2012-02-12 ENCOUNTER — Emergency Department (HOSPITAL_COMMUNITY)
Admission: EM | Admit: 2012-02-12 | Discharge: 2012-02-12 | Disposition: A | Discharge status: Home / Self Care | Payer: Medicare Other | Attending: Emergency Medicine | Admitting: Emergency Medicine

## 2012-02-12 ENCOUNTER — Encounter (HOSPITAL_COMMUNITY): Payer: Self-pay | Admitting: *Deleted

## 2012-02-12 ENCOUNTER — Encounter: Payer: Self-pay | Admitting: Internal Medicine

## 2012-02-12 VITALS — BP 142/70 | HR 90 | Ht 63.0 in | Wt 227.8 lb

## 2012-02-12 DIAGNOSIS — W19XXXA Unspecified fall, initial encounter: Secondary | ICD-10-CM

## 2012-02-12 DIAGNOSIS — I10 Essential (primary) hypertension: Secondary | ICD-10-CM | POA: Insufficient documentation

## 2012-02-12 DIAGNOSIS — J4489 Other specified chronic obstructive pulmonary disease: Secondary | ICD-10-CM | POA: Insufficient documentation

## 2012-02-12 DIAGNOSIS — R51 Headache: Secondary | ICD-10-CM | POA: Insufficient documentation

## 2012-02-12 DIAGNOSIS — M129 Arthropathy, unspecified: Secondary | ICD-10-CM | POA: Insufficient documentation

## 2012-02-12 DIAGNOSIS — J449 Chronic obstructive pulmonary disease, unspecified: Secondary | ICD-10-CM

## 2012-02-12 DIAGNOSIS — J301 Allergic rhinitis due to pollen: Secondary | ICD-10-CM

## 2012-02-12 DIAGNOSIS — E039 Hypothyroidism, unspecified: Secondary | ICD-10-CM | POA: Insufficient documentation

## 2012-02-12 DIAGNOSIS — R5381 Other malaise: Secondary | ICD-10-CM | POA: Insufficient documentation

## 2012-02-12 DIAGNOSIS — R110 Nausea: Secondary | ICD-10-CM | POA: Insufficient documentation

## 2012-02-12 DIAGNOSIS — R531 Weakness: Secondary | ICD-10-CM

## 2012-02-12 DIAGNOSIS — R079 Chest pain, unspecified: Secondary | ICD-10-CM | POA: Insufficient documentation

## 2012-02-12 DIAGNOSIS — H9209 Otalgia, unspecified ear: Secondary | ICD-10-CM

## 2012-02-12 DIAGNOSIS — H9201 Otalgia, right ear: Secondary | ICD-10-CM

## 2012-02-12 DIAGNOSIS — E119 Type 2 diabetes mellitus without complications: Secondary | ICD-10-CM | POA: Insufficient documentation

## 2012-02-12 DIAGNOSIS — J309 Allergic rhinitis, unspecified: Secondary | ICD-10-CM

## 2012-02-12 DIAGNOSIS — Z9181 History of falling: Secondary | ICD-10-CM | POA: Insufficient documentation

## 2012-02-12 LAB — URINALYSIS, ROUTINE W REFLEX MICROSCOPIC
Bilirubin Urine: NEGATIVE
Glucose, UA: NEGATIVE mg/dL
Hgb urine dipstick: NEGATIVE
Ketones, ur: NEGATIVE mg/dL
Protein, ur: NEGATIVE mg/dL
Urobilinogen, UA: 0.2 mg/dL (ref 0.0–1.0)

## 2012-02-12 LAB — CBC
Hemoglobin: 12.1 g/dL (ref 12.0–15.0)
MCH: 28.9 pg (ref 26.0–34.0)
MCHC: 33 g/dL (ref 30.0–36.0)
MCV: 87.6 fL (ref 78.0–100.0)
RBC: 4.19 MIL/uL (ref 3.87–5.11)

## 2012-02-12 LAB — BASIC METABOLIC PANEL
BUN: 16 mg/dL (ref 6–23)
CO2: 29 mEq/L (ref 19–32)
Calcium: 9.7 mg/dL (ref 8.4–10.5)
Creatinine, Ser: 0.87 mg/dL (ref 0.50–1.10)
GFR calc non Af Amer: 67 mL/min — ABNORMAL LOW (ref >90)
Glucose, Bld: 89 mg/dL (ref 70–99)
Sodium: 139 mEq/L (ref 135–145)

## 2012-02-12 LAB — POCT I-STAT TROPONIN I: Troponin i, poc: 0.01 ng/mL (ref 0.00–0.08)

## 2012-02-12 LAB — GLUCOSE, CAPILLARY: Glucose-Capillary: 88 mg/dL (ref 70–99)

## 2012-02-12 MED ORDER — ANTIPYRINE-BENZOCAINE 5.4-1.4 % OT SOLN: 3.0000 [drp] | Freq: Four times a day (QID) | OTIC | Status: AC | PRN

## 2012-02-12 NOTE — Patient Instructions (Signed)
Script sent for ear drops to try for the pain   Ok to call for refill of the doxycycline antibiotic if needed  Continue allergy vaccine

## 2012-02-12 NOTE — Discharge Instructions (Signed)
Fall Prevention, Elderly Falls are the leading cause of injuries, accidents, and accidental deaths in people over the age of 31. Falling is a real threat to your ability to live on your own. CAUSES   Poor eyesight or poor hearing can make you more likely to fall.   Illnesses and physical conditions can affect your strength and balance.   Poor lighting, throw rugs and pets in your home can make you more likely to trip or slip.   The side effects of some medicines can upset your balance and lead to falling. These include medicines for depression, sleep problems, high blood pressure, diabetes, and heart conditions.  PREVENTION  Be sure your home is as safe as possible. Here are some tips:  Wear shoes with non-skid soles (not house slippers).   Be sure your home and outside area are well lit.   Use night lights throughout your house, including hallways and stairways.   Remove clutter and clean up spills on floors and walkways.   Remove throw rugs or fasten them to the floor with carpet tape. Tack down carpet edges.   Do not place electrical cords across pathways.   Install grab bars in your bathtub, shower, and toilet area. Towel bars should not be used as a grab bar.   Install handrails on both sides of stairways.   Do not climb on stools or stepladders. Get someone else to help with jobs that require climbing.   Do not wax your floors at all, or use a non-skid wax.   Repair uneven or unsafe sidewalks, walkways or stairs.   Keep frequently used items within reach.   Be aware of pets so you do not trip.  Get regular check-ups from your doctor, and take good care of yourself:  Have your eyes checked every year for vision changes, cataracts, glaucoma, and other eye problems. Wear eyeglasses as directed.   Have your hearing checked every 2 years, or anytime you or others think that you cannot hear well. Use hearing aids as directed.   See your caregiver if you have foot pain  or corns. Sore feet can contribute to falls.   Let your caregiver know if a medicine is making you feel dizzy or making you lose your balance.   Use a cane, walker, or wheelchair as directed. Use walker or wheelchair brakes when getting in and out.   When you get up from bed, sit on the side of the bed for 1 to 2 minutes before you stand up. This will give your blood pressure time to adjust, and you will feel less dizzy.   If you need to go to the bathroom often, consider using a bedside commode.  Keep your body in good shape:  Get regular exercise, especially walking.   Do exercises to strengthen the muscles you use for walking and lifting.   Do not smoke.   Minimize use of alcohol.  SEEK IMMEDIATE MEDICAL CARE IF:   You feel dizzy, weak, or unsteady on your feet.   You feel confused.   You fall.  Document Released: 11/16/2005 Document Revised: 11/05/2011 Document Reviewed: 05/13/2007 [facility] Patient Information 2012 [place], [place].Weakness Weakness can be caused by many things. Your doctor has checked for the most common causes. HOME CARE  Rest.   Eat a well-balanced diet.   Exercise every day.   Go to follow-up appointments.  GET HELP RIGHT AWAY IF:   There is chest pain or chest pressure.   You  have problems breathing.   You cannot do usual daily activities.   You have problems speaking or swallowing.   You have a very bad headache or belly (abdominal) pain.   The heartbeat does not feel normal or the pulse is very fast.   You are confused.   You have vision problems or problems walking.   You have chills.   You or your child has a temperature by mouth above 102 F (38.9 C), not controlled by medicine.   Your baby is older than 3 months with a rectal temperature of 102 F (38.9 C) or higher.   Your baby is 74 months old or younger with a rectal temperature of 100.4 F (38 C) or higher.  MAKE SURE YOU:   Understand these instructions.   Will  watch this condition.   Will get help right away if you are not doing well or get worse.  Document Released: 10/29/2008 Document Revised: 11/05/2011 Document Reviewed: 10/29/2008 [facility] Patient Information 2012 [place], [place].

## 2012-02-12 NOTE — ED Provider Notes (Signed)
History     CSN: [id number]  Arrival date & time 02/12/12  1432   First MD Initiated Contact with Patient 02/12/12 1802      Chief Complaint  Patient presents with  . Nausea  . Headache  . Fall  Awoke with nausea, states fell several times today.  C/o HA's. Gradual onset. Bitemporal.  Also c/o "constant" chest pain.  Vague.  No vomiting. No injuries. No recent illness.  No med changes. No fevers.  States, "Sometimes I feel like I got cancer...because I got pains in my back."  (Consider location/radiation/quality/duration/timing/severity/associated sxs/prior treatment) HPI  Past Medical History  Diagnosis Date  . Personal history of colonic polyps   . Iron deficiency anemia, unspecified   . Dizziness   . Other malaise and fatigue   . Chest pain, unspecified   . Other B-complex deficiencies   . Arthritis     Right Sacroiliac joint  . Other and unspecified hyperlipidemia   . Anxiety and depression   . HTN (hypertension)   . Unspecified hypothyroidism   . Type II or unspecified type diabetes mellitus without mention of complication, not stated as uncontrolled   . Allergic rhinitis, cause unspecified   . Unspecified asthma   . Chronic airway obstruction, not elsewhere classified   . Abnormal stress test 08/21/2008    Neg  . GERD (gastroesophageal reflux disease)     Past Surgical History  Procedure Date  . Foot surgery     hammer toe 2004, 2005  . Abdominal hysterectomy 1982    menometorrhagia  . Carpal tunnel release 03/05/2009    bilateral    Family History  Problem Relation Age of Onset  . Hypertension Mother   . Diabetes Mother   . Heart disease Mother     CHF  . Coronary artery disease Mother   . Lung cancer Father   . Hypertension Father   . Diabetes Father   . Liver cancer Father     w/mets  . Prostate cancer Father   . Breast cancer Sister   . Lung disease Brother     Agent Orange Related  . Asthma Brother   . Allergies Brother   . Colon cancer  Neg Hx     History  Substance Use Topics  . Smoking status: Former Games developer  . Smokeless tobacco: Not on file   Comment: Quit 9 years ago.  . Alcohol Use: No    OB History    Grav Para Term Preterm Abortions TAB SAB Ect Mult Living                  Review of Systems  All other systems reviewed and are negative.    Allergies  Aspirin; Codeine; Demerol; Latex; Lisinopril; and Meperidine hcl  Home Medications   Current Outpatient Rx  Name Route Sig Dispense Refill  . TIOTROPIUM BROMIDE MONOHYDRATE 18 MCG IN CAPS Inhalation Place 1 capsule (18 mcg total) into inhaler and inhale daily. 90 capsule 3  . ALBUTEROL SULFATE HFA 108 (90 BASE) MCG/ACT IN AERS Inhalation Inhale 2 puffs into the lungs every 6 (six) hours as needed. For SOB. 1 Inhaler 11  . ALBUTEROL SULFATE (2.5 MG/3ML) 0.083% IN NEBU Nebulization Take 2.5 mg by nebulization every 6 (six) hours as needed. For SOB 75 mL 11  . AMLODIPINE BESYLATE 5 MG PO TABS       . ANTIPYRINE-BENZOCAINE 5.4-1.4 % OT SOLN Right Ear Place 3 drops into the right ear 4 (four) times  daily as needed for pain. 15 mL 3  . ASPIRIN-ACETAMINOPHEN-CAFFEINE 250-250-65 MG PO TABS Oral Take 1 tablet by mouth every 6 (six) hours as needed. Per bottle instructions.     . AZELASTINE HCL 137 MCG/SPRAY NA SOLN Nasal Place 1 spray into the nose at bedtime. Use in each nostril as directed 30 mL 5  . BUDESONIDE-FORMOTEROL FUMARATE 80-4.5 MCG/ACT IN AERO Inhalation Inhale 2 puffs into the lungs 2 (two) times daily. 1 Inhaler 11  . CALCIUM CARBONATE-VITAMIN D 600-400 MG-UNIT PO TABS Oral Take 1 tablet by mouth 2 (two) times daily.      Marland Kitchen CLONAZEPAM 1 MG PO TABS Oral Take 1-2 tablets (1-2 mg total) by mouth at bedtime as needed for anxiety. 180 tablet 1  . CLONAZEPAM 1 MG PO TABS  TAKE 1-2 TABLETS BY MOUTH EVERY NIGHT AT BEDTIME AS NEEDED 180 tablet 5  . CRANBERRY 500 MG PO CAPS Oral Take 1 capsule by mouth daily.      Marland Kitchen ESOMEPRAZOLE MAGNESIUM 40 MG PO CPDR Oral Take  40 mg by mouth 2 (two) times daily.      Marland Kitchen EZETIMIBE 10 MG PO TABS Oral Take 1 tablet (10 mg total) by mouth daily. 90 tablet 3  . FERROUS SULFATE 325 (65 FE) MG PO TBEC Oral Take 325 mg by mouth daily.      . FUROSEMIDE 40 MG PO TABS Oral Take 1 tablet (40 mg total) by mouth daily. For HTN. 30 tablet 5  . GABAPENTIN 300 MG PO CAPS  See Notes 245 capsule 1    TAKE 1 CAPSULE BY MOUTH AT BEDTIME FOR 3 DAYS , TH ...  . GLUCOSAMINE-CHONDROITIN 500-400 MG PO TABS Oral Take 1 tablet by mouth 2 (two) times daily.    Marland Kitchen GLUCOSE BLOOD VI STRP Other 1 each by Other route as needed. Use as instructed. 1 test strip every CBG check/4 times a day.     Marland Kitchen HYDROCODONE-HOMATROPINE 5-1.5 MG/5ML PO SYRP Oral Take 2.5 mLs by mouth every 6 (six) hours as needed. 1-1.5 teaspoon every 4-6 hours as needed for cough, may make you sleepy.     . INSULIN GLARGINE 100 UNIT/ML [place] SOLN Subcutaneous Inject 40 Units into the skin daily. 20 mL 3  . INSULIN PEN NEEDLE 30G X 8 MM MISC Subcutaneous Inject 10 each into the skin daily. 100 each 3  . INSULIN SYRINGE 29G X 1/2" 1 ML MISC  USE AS DIRECTED WITH LANTUS 100 each 3  . LANCETS MISC Does not apply by Does not apply route. Patient's choice. 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 Test 3 times a day as needed.     Marland Kitchen LEVOTHYROXINE SODIUM 88 MCG PO TABS Oral Take 1 tablet (88 mcg total) by mouth daily. 90 tablet 3  . METFORMIN HCL 1000 MG PO TABS Oral Take 1 tablet (1,000 mg total) by mouth 2 (two) times daily with a meal. 180 tablet 3  . ICAPS MV PO TABS Oral Take 1 tablet by mouth daily.      . NON FORMULARY  Allergy Shots 1:10. Advance per protocol.     Marland Kitchen PREDNISONE 10 MG PO TABS Oral Take 10 mg by mouth daily. As directed.     Marland Kitchen PSEUDOEPHEDRINE HCL ER 120 MG PO TB12 Oral Take 120 mg by mouth every 12 (twelve) hours as needed.      Marland Kitchen SALINE NASAL SPRAY 0.65 % NA SOLN Nasal Place 2 sprays into the nose as needed.      Marland Kitchen  VALSARTAN 160 MG PO TABS Oral Take 1 tablet (160 mg total) by mouth  daily. 90 tablet 3  . VENLAFAXINE HCL 75 MG PO TABS Oral Take 1 tablet (75 mg total) by mouth 2 (two) times daily. 60 tablet 11  . WELCHOL 3.75 G PO PACK  TAKE 1 PACKET BY MOUTH ONCE DAILY 30 each 1  . ZETIA 10 MG PO TABS  TAKE 1 TABLET BY MOUTH DAILY 90 tablet 0    BP 119/64  Pulse 88  Temp(Src) 98.5 F (36.9 C) (Oral)  Resp 18  SpO2 98%  Physical Exam  Nursing note and vitals reviewed. Constitutional: She is oriented to person, place, and time. She appears well-developed and well-nourished.  HENT:  Head: Normocephalic and atraumatic.  Eyes: Conjunctivae and EOM are normal. Pupils are equal, round, and reactive to light.  Neck: Neck supple.  Cardiovascular: Normal rate and regular rhythm.  Exam reveals no gallop and no friction rub.   No murmur heard. Pulmonary/Chest: Breath sounds normal. She has no wheezes. She has no rales. She exhibits no tenderness.  Abdominal: Soft. Bowel sounds are normal. She exhibits no distension. There is no tenderness. There is no rebound and no guarding.  Musculoskeletal: Normal range of motion.  Neurological: She is alert and oriented to person, place, and time. She displays normal reflexes. No cranial nerve deficit. She exhibits normal muscle tone. Coordination normal.  Skin: Skin is warm and dry. No rash noted.  Psychiatric: She has a normal mood and affect.    ED Course  Procedures (including critical care time)   Labs Reviewed  GLUCOSE, CAPILLARY   No results found.   No diagnosis found.    MDM  Pt is seen and examined;  Initial history and physical completed.  Will follow.         Date: 02/12/2012  Rate: 78  Rhythm: normal sinus rhythm  QRS Axis: left  Intervals: PR shortened  ST/T Wave abnormalities: nonspecific ST/T changes  Conduction Disutrbances:left anterior fascicular block  Narrative Interpretation:   Old EKG Reviewed: changes noted  Results for orders placed during the hospital encounter of 02/12/12  GLUCOSE,  CAPILLARY      Component Value Range   Glucose-Capillary 88  70 - 99 (mg/dL)  CBC      Component Value Range   WBC 8.9  4.0 - 10.5 (K/uL)   RBC 4.19  3.87 - 5.11 (MIL/uL)   Hemoglobin 12.1  12.0 - 15.0 (g/dL)   HCT 21.3  08.6 - 57.8 (%)   MCV 87.6  78.0 - 100.0 (fL)   MCH 28.9  26.0 - 34.0 (pg)   MCHC 33.0  30.0 - 36.0 (g/dL)   RDW 46.9  62.9 - 52.8 (%)   Platelets 262  150 - 400 (K/uL)  BASIC METABOLIC PANEL      Component Value Range   Sodium 139  135 - 145 (mEq/L)   Potassium 3.6  3.5 - 5.1 (mEq/L)   Chloride 102  96 - 112 (mEq/L)   CO2 29  19 - 32 (mEq/L)   Glucose, Bld 89  70 - 99 (mg/dL)   BUN 16  6 - 23 (mg/dL)   Creatinine, Ser 4.13  0.50 - 1.10 (mg/dL)   Calcium 9.7  8.4 - 24.4 (mg/dL)   GFR calc non Af Amer 67 (*) >90 (mL/min)   GFR calc Af Amer 78 (*) >90 (mL/min)  URINALYSIS, ROUTINE W REFLEX MICROSCOPIC      Component Value Range  Color, Urine YELLOW  YELLOW    APPearance CLEAR  CLEAR    Specific Gravity, Urine 1.015  1.005 - 1.030    pH 5.5  5.0 - 8.0    Glucose, UA NEGATIVE  NEGATIVE (mg/dL)   Hgb urine dipstick NEGATIVE  NEGATIVE    Bilirubin Urine NEGATIVE  NEGATIVE    Ketones, ur NEGATIVE  NEGATIVE (mg/dL)   Protein, ur NEGATIVE  NEGATIVE (mg/dL)   Urobilinogen, UA 0.2  0.0 - 1.0 (mg/dL)   Nitrite NEGATIVE  NEGATIVE    Leukocytes, UA NEGATIVE  NEGATIVE   POCT I-STAT TROPONIN I      Component Value Range   Troponin i, poc 0.01  0.00 - 0.08 (ng/mL)   Comment 3           GLUCOSE, CAPILLARY      Component Value Range   Glucose-Capillary 71  70 - 99 (mg/dL)   Dg Chest 2 View  1/61/0960  *RADIOLOGY REPORT*  Clinical Data: Chest pain, COPD  CHEST - 2 VIEW  Comparison: Chest radiograph 02/08/2011  Findings: Normal cardiac silhouette.  There is mild interstitial pattern similar to prior.  Lungs are slightly hyperinflated.  No effusion, infiltrate, pneumothorax. No acute osseous abnormality.  IMPRESSION:  1.  No acute cardiopulmonary findings. 2.  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  Original Report Authenticated By: Genevive Bi, M.D.   Ct Head Wo Contrast  02/12/2012  *RADIOLOGY REPORT*  Clinical Data: Dizziness, weakness, headache  CT HEAD WITHOUT CONTRAST  Technique:  Contiguous axial images were obtained from the base of the skull through the vertex without contrast.  Comparison: 01/14/2010  Findings: No skull fracture is noted.  Paranasal sinuses are unremarkable.  Partial opacification of the right mastoid air cells.  No intracranial hemorrhage, mass effect or midline shift.  No acute infarction.  Mild cerebral atrophy.  No mass lesion is noted on this unenhanced scan.  Ventricular size is stable from prior exam.  IMPRESSION: No acute intracranial abnormality.  Mild cerebral atrophy.  Original Report Authenticated By: Natasha Mead, M.D.      Patient was given the results of all of her testing. All reassuring including a loculated panel, urine. The CBC had CT chest x-ray and cardiac troponin.   No clear etiology for her weakness, but she does feel better and is amenable to going home. Recommended close followup with her primary care doctor within 48 hours. Return to the ED for any concerns or changing symptoms  Neave Lenger A. Patrica Duel, MD 02/12/12 2159

## 2012-02-12 NOTE — Progress Notes (Signed)
Patient ID: [name], female    DOB: 07-09-1946, 66 y.o.   MRN: [id number]  HPI 05/19/11- 2 yoF former heavy smoker followed for asthma/ COPD, hx food intolerance, allergic rhinitis, complicated by obesity, DM Last here November 20, 2010- note reviewed Allergy vaccine 1:10 GH Says she has been doing well. Spring pollen bothered nose and chest in March. Had some infection then, helped by doxycycline.  Reports no change in dry hacking cough, not relieved by cough syrup. Occasional, infrequent heart burn. CXR 02/08/11- [place]- lungs clear, ASVD We discussed updating PFT. She had smoked up to 3 PPD  07/13/11- 65 yoF former heavy smoker followed for asthma/ COPD, hx food intolerance, allergic rhinitis, complicated by obesity, DM Did not schedule PFT after last visit as intended, but has done so now.  She feels well. Remains off cigarettes.  Coughs only after using her inhaler- only a dry cough. No longer experiences shortness of breath.  Allergy vaccine- she feels they help her a lot. Went to ENT for ear pain- blamed throat congestion on reflux and Rx'd bid Nexium- "helps a lot".   09/18/11-  65 yoF former heavy smoker followed for asthma/ COPD, hx food intolerance, allergic rhinitis, complicated by obesity, DM Has had flu vaccine. She had done fairly well since here in August. Last night she had onset of dry cough and head congestion without fever or sore throat. Intermittent pain in the right side of the neck at the angle of the jaw comes and goes. It is not associated with popping, vertigo, pain on chewing or altered hearing. History of smoking 3 packs per day. PFT 08/26/2011-FEV1 1.45/76%, FEV1/FVC 0.7 no, FEF 25-75% was 35% of predicted with slight response to bronchodilator. TLC 100% RV 131% DLCO 54%. Moderate obstruction with slight response to bronchodilator and air trapping. This is probably mostly some emphysema.  CXR 01/29/2011-atherosclerosis with clear lung fields.  02/12/12-   65 yoF former heavy smoker followed for asthma/ COPD, hx food intolerance, allergic rhinitis, complicated by obesity, DM Continues allergy vaccine without problems. She is sure it helps. She used her last doxycycline about 2 weeks ago for chest congestion. We discussed this. With some vertigo and pressure in right ear. Often has pain in right side of neck. Occasional minor cough. She is on lisinopril and we discussed this carefully. Using rescue inhaler twice a week. Last PFT was 08/26/2011. Charted.   Review of Systems- see HPI Constitutional:   No-   weight loss, night sweats, fevers, chills, fatigue, lassitude. HEENT:   No-  headaches, difficulty swallowing, tooth/dental problems, sore throat,       No-  sneezing, itching, +ear ache, nasal congestion, post nasal drip,  CV:  No-   chest pain, orthopnea, PND, swelling in lower extremities, anasarca, dizziness, palpitations Resp: Some  shortness of breath with exertion or at rest.              No-   productive cough,  + non-productive cough,  No-  coughing up of blood.              No-   change in color of mucus.  No- wheezing.   Skin: No-   rash or lesions. GI:  No-   heartburn, indigestion, abdominal pain, nausea, vomiting, GU: MS:  No-   joint pain or swelling.  Neuro- grossly normal to observation, Or:  Psych:  No- change in mood or affect. No depression or anxiety.  No memory loss.  Objective:   Physical Exam General- Alert, Oriented, Affect-appropriate, Distress- none acute    obese Skin- rash-none, lesions- none, excoriation- none Lymphadenopathy- none Head- atraumatic            Eyes- Gross vision intact, PERRLA, conjunctivae clear secretions            Ears- Hearing, canals- normal            Nose- Clear, No- Septal dev, mucus, polyps, erosion, perforation             Throat- Mallampati III , mucosa clear , drainage- none, tonsils- atrophic      dentures Neck- flexible , trachea midline, no stridor , thyroid nl, carotid no  bruit, nontender Chest - symmetrical excursion , unlabored           Heart/CV- RRR , no murmur , no gallop  , no rub, nl s1 s2                           - JVD- none , edema- none, stasis changes- none, varices- none           Lung- trace crackle in the bases , wheeze- none, cough- none , dullness-none, rub- none           Chest wall-  Abd- tender-no, distended-no, bowel sounds-present, HSM- no Br/ Gen/ Rectal- Not done, not indicated Extrem- cyanosis- none, clubbing, none, atrophy- none, strength- nl Neuro- resting tremor

## 2012-02-12 NOTE — ED Notes (Signed)
Per RN, [place] pt cannot eat at this time.

## 2012-02-12 NOTE — ED Notes (Signed)
Report given to tim,rn

## 2012-02-15 DIAGNOSIS — H9201 Otalgia, right ear: Secondary | ICD-10-CM | POA: Insufficient documentation

## 2012-02-15 NOTE — Assessment & Plan Note (Addendum)
Her ear looks normal from the outside. I don't think she is describing TMJ pain.  Plan-doxycycline is refilled. Auralgan ear drops.

## 2012-02-15 NOTE — Assessment & Plan Note (Signed)
Continue allergy vaccine.  

## 2012-02-15 NOTE — Assessment & Plan Note (Signed)
To continue present meds 

## 2012-02-16 ENCOUNTER — Encounter: Payer: Self-pay | Admitting: Internal Medicine

## 2012-02-21 ENCOUNTER — Other Ambulatory Visit: Payer: Self-pay | Admitting: Internal Medicine

## 2012-02-22 ENCOUNTER — Telehealth: Payer: Self-pay | Admitting: Internal Medicine

## 2012-02-22 MED ORDER — DOXYCYCLINE HYCLATE 100 MG PO TABS: ORAL_TABLET | ORAL | Status: DC

## 2012-02-22 NOTE — Telephone Encounter (Signed)
Rx refill for Doxycycline sent to [facility] on [address] per CY's last ov note.  Pt notified.

## 2012-02-23 ENCOUNTER — Ambulatory Visit (INDEPENDENT_AMBULATORY_CARE_PROVIDER_SITE_OTHER): Payer: Medicare Other

## 2012-02-23 DIAGNOSIS — J309 Allergic rhinitis, unspecified: Secondary | ICD-10-CM

## 2012-03-02 ENCOUNTER — Ambulatory Visit (INDEPENDENT_AMBULATORY_CARE_PROVIDER_SITE_OTHER): Payer: Medicare Other | Admitting: *Deleted

## 2012-03-02 ENCOUNTER — Ambulatory Visit (INDEPENDENT_AMBULATORY_CARE_PROVIDER_SITE_OTHER): Payer: Medicare Other

## 2012-03-02 DIAGNOSIS — E538 Deficiency of other specified B group vitamins: Secondary | ICD-10-CM

## 2012-03-02 DIAGNOSIS — J309 Allergic rhinitis, unspecified: Secondary | ICD-10-CM

## 2012-03-02 MED ORDER — CYANOCOBALAMIN 1000 MCG/ML IJ SOLN
1000.0000 ug | Freq: Once | INTRAMUSCULAR | Status: AC
  Administered 2012-03-02: 1000 ug via INTRAMUSCULAR

## 2012-03-03 ENCOUNTER — Ambulatory Visit (INDEPENDENT_AMBULATORY_CARE_PROVIDER_SITE_OTHER): Payer: Medicare Other

## 2012-03-03 ENCOUNTER — Other Ambulatory Visit: Payer: Self-pay | Admitting: Internal Medicine

## 2012-03-03 DIAGNOSIS — J309 Allergic rhinitis, unspecified: Secondary | ICD-10-CM

## 2012-03-03 NOTE — Telephone Encounter (Signed)
Please advise if okay to give refill on abx. Thanks.

## 2012-03-03 NOTE — Telephone Encounter (Signed)
Ok to refill 

## 2012-03-08 ENCOUNTER — Ambulatory Visit (INDEPENDENT_AMBULATORY_CARE_PROVIDER_SITE_OTHER): Payer: Medicare Other

## 2012-03-08 DIAGNOSIS — J309 Allergic rhinitis, unspecified: Secondary | ICD-10-CM

## 2012-03-18 ENCOUNTER — Telehealth: Payer: Self-pay | Admitting: Internal Medicine

## 2012-03-18 ENCOUNTER — Ambulatory Visit (INDEPENDENT_AMBULATORY_CARE_PROVIDER_SITE_OTHER): Payer: Medicare Other

## 2012-03-18 ENCOUNTER — Other Ambulatory Visit: Payer: Self-pay

## 2012-03-18 DIAGNOSIS — J309 Allergic rhinitis, unspecified: Secondary | ICD-10-CM

## 2012-03-18 MED ORDER — EZETIMIBE 10 MG PO TABS: 10.0000 mg | ORAL_TABLET | Freq: Every day | ORAL | Status: DC

## 2012-03-18 NOTE — Telephone Encounter (Signed)
Called pt for clarification of her request; pt says her insurance is requesting she use the "generic" Astelin.  She has tried for 2 weeks and had bad headaches, cannot use. Pharmacy informed her she needs a "letter" from her prescribing MD stating it is medically necessary for her to use  the original Astelin,  Letter needs to be  directed to her insurance co [employer].

## 2012-03-18 NOTE — Telephone Encounter (Signed)
[name], have you seen this CMN, please advise, thanks!

## 2012-03-22 NOTE — Telephone Encounter (Signed)
lmomtcb  

## 2012-03-22 NOTE — Telephone Encounter (Signed)
What has she been using? Astelin is listed on our medication list. I don't know that there is a generic.

## 2012-03-22 NOTE — Telephone Encounter (Signed)
CY, please advise if you can/will dictate a letter on behalf of patient. Thanks.

## 2012-03-23 NOTE — Telephone Encounter (Signed)
Pt returned call from triage. Call [phone] (pt is calling from Jesi stanley's phone). Hazel Sams

## 2012-03-23 NOTE — Telephone Encounter (Signed)
lmomtcb x 2  

## 2012-03-23 NOTE — Telephone Encounter (Signed)
I spoke with pt and she states astelin just recently went generic. She states the generic causes her to have HA and the brand name does not. That's the reason she needed the letter done. Please advise Dr. Maple Hudson, thanks

## 2012-03-24 NOTE — Letter (Signed)
March 24, 2012   Tyger Oka [address] [place], [place] [address]  RE:  [name], [name] MRN:  [id number]  /  DOB:  May 25, 1946  Dear Ms. Earl Lites:  You are under my care for problems including allergic rhinitis due to pollen.  I understand you find that generic Astelin causes headaches, but the brand name Astelin product does not.  On that basis, I Recommended you continue using the brand name Astelin nasal antihistamine spray for management of allergic nasal symptoms.   Sincerely,     Lilac Hoff D. Maple Hudson, MD, FCCP, FACP   CDY/MedQ  DD: 03/24/2012  DT: 03/24/2012  Job #: [id number]

## 2012-03-24 NOTE — Telephone Encounter (Signed)
Letter dictated

## 2012-03-29 ENCOUNTER — Telehealth: Payer: Self-pay | Admitting: Internal Medicine

## 2012-03-29 NOTE — Telephone Encounter (Signed)
Called pt's home # but this # has been disconnected  Called pt's cell # - [phone] - [email or web address] to see if she wants letter mailed or to pick up once ready.

## 2012-03-29 NOTE — Telephone Encounter (Signed)
Returning call.

## 2012-03-29 NOTE — Telephone Encounter (Signed)
Katie, have you seen this yet?

## 2012-03-29 NOTE — Telephone Encounter (Signed)
Letter in allergy for patient to pick up on Wednesday 03-30-2012.

## 2012-03-29 NOTE — Telephone Encounter (Signed)
Letter has been dictated in EPIC-had to call [name] to have her print out. I will need to know if patient wants to pick up or mailed to her. Thanks.

## 2012-03-29 NOTE — Telephone Encounter (Signed)
[name] calling to inform pt:  [name], [facility] 03/29/2012 10:45 AM Signed  Letter in allergy for patient to pick up on Wednesday 03-30-2012.    Called, spoke with pt on her cell phone.  She is aware of above.

## 2012-03-29 NOTE — Telephone Encounter (Signed)
Called, spoke with pt.  States she is supposed to come in tomorrow for allergy injections and B12 injection.  She would like to pick letter up then, if possible. Will forward msg to Katie to advise when letter is ready.  Thank you.

## 2012-04-01 ENCOUNTER — Ambulatory Visit (INDEPENDENT_AMBULATORY_CARE_PROVIDER_SITE_OTHER): Payer: Medicare Other

## 2012-04-01 ENCOUNTER — Ambulatory Visit (INDEPENDENT_AMBULATORY_CARE_PROVIDER_SITE_OTHER): Payer: Medicare Other | Admitting: *Deleted

## 2012-04-01 DIAGNOSIS — D649 Anemia, unspecified: Secondary | ICD-10-CM

## 2012-04-01 DIAGNOSIS — J309 Allergic rhinitis, unspecified: Secondary | ICD-10-CM

## 2012-04-01 MED ORDER — CYANOCOBALAMIN 1000 MCG/ML IJ SOLN
1000.0000 ug | Freq: Once | INTRAMUSCULAR | Status: AC
  Administered 2012-04-01: 1000 ug via INTRAMUSCULAR

## 2012-04-08 ENCOUNTER — Ambulatory Visit (INDEPENDENT_AMBULATORY_CARE_PROVIDER_SITE_OTHER): Payer: Medicare Other

## 2012-04-08 DIAGNOSIS — J309 Allergic rhinitis, unspecified: Secondary | ICD-10-CM

## 2012-04-12 ENCOUNTER — Other Ambulatory Visit: Payer: Self-pay

## 2012-04-12 MED ORDER — INSULIN PEN NEEDLE 30G X 8 MM MISC: 1.0000 | Freq: Every day | Status: DC

## 2012-04-14 ENCOUNTER — Encounter: Payer: Self-pay | Admitting: Internal Medicine

## 2012-04-14 ENCOUNTER — Ambulatory Visit (INDEPENDENT_AMBULATORY_CARE_PROVIDER_SITE_OTHER)
Admission: RE | Admit: 2012-04-14 | Discharge: 2012-04-14 | Disposition: A | Discharge status: Home / Self Care | Payer: Medicaid Other | Source: Ambulatory Visit | Attending: Internal Medicine | Admitting: Internal Medicine

## 2012-04-14 ENCOUNTER — Other Ambulatory Visit (INDEPENDENT_AMBULATORY_CARE_PROVIDER_SITE_OTHER): Payer: Medicare Other

## 2012-04-14 ENCOUNTER — Ambulatory Visit (INDEPENDENT_AMBULATORY_CARE_PROVIDER_SITE_OTHER): Payer: Medicare Other | Admitting: Internal Medicine

## 2012-04-14 VITALS — BP 102/64 | HR 70 | Temp 98.5°F | Ht 63.0 in | Wt 220.0 lb

## 2012-04-14 DIAGNOSIS — R443 Hallucinations, unspecified: Secondary | ICD-10-CM

## 2012-04-14 DIAGNOSIS — R197 Diarrhea, unspecified: Secondary | ICD-10-CM

## 2012-04-14 DIAGNOSIS — R1032 Left lower quadrant pain: Secondary | ICD-10-CM

## 2012-04-14 DIAGNOSIS — E119 Type 2 diabetes mellitus without complications: Secondary | ICD-10-CM

## 2012-04-14 DIAGNOSIS — F0280 Dementia in other diseases classified elsewhere without behavioral disturbance: Secondary | ICD-10-CM | POA: Insufficient documentation

## 2012-04-14 LAB — BASIC METABOLIC PANEL
BUN: 17 mg/dL (ref 6–23)
CO2: 26 mEq/L (ref 19–32)
Chloride: 100 mEq/L (ref 96–112)
GFR: 70.65 mL/min (ref >60.00)
Glucose, Bld: 134 mg/dL — ABNORMAL HIGH (ref 70–99)
Potassium: 4.4 mEq/L (ref 3.5–5.1)
Sodium: 136 mEq/L (ref 135–145)

## 2012-04-14 LAB — URINALYSIS, ROUTINE W REFLEX MICROSCOPIC
Bilirubin Urine: NEGATIVE
Hgb urine dipstick: NEGATIVE
Nitrite: NEGATIVE

## 2012-04-14 LAB — CBC WITH DIFFERENTIAL/PLATELET
Basophils Absolute: 0 10*3/uL (ref 0.0–0.1)
HCT: 40.6 % (ref 36.0–46.0)
Lymphs Abs: 2.6 10*3/uL (ref 0.7–4.0)
MCV: 88.1 fl (ref 78.0–100.0)
Monocytes Absolute: 0.7 10*3/uL (ref 0.1–1.0)
Monocytes Relative: 7.6 % (ref 3.0–12.0)
Neutrophils Relative %: 63.6 % (ref 43.0–77.0)
Platelets: 269 10*3/uL (ref 150.0–400.0)
RDW: 14.4 % (ref 11.5–14.6)
WBC: 9.3 10*3/uL (ref 4.5–10.5)

## 2012-04-14 LAB — HEPATIC FUNCTION PANEL
ALT: 15 U/L (ref 0–35)
AST: 18 U/L (ref 0–37)
Albumin: 3.8 g/dL (ref 3.5–5.2)
Alkaline Phosphatase: 94 U/L (ref 39–117)

## 2012-04-14 LAB — LIPID PANEL
HDL: 46.8 mg/dL (ref >39.00)
Total CHOL/HDL Ratio: 4
Triglycerides: 344 mg/dL — ABNORMAL HIGH (ref 0.0–149.0)
VLDL: 68.8 mg/dL — ABNORMAL HIGH (ref 0.0–40.0)

## 2012-04-14 LAB — TSH: TSH: 6.71 u[IU]/mL — ABNORMAL HIGH (ref 0.35–5.50)

## 2012-04-14 MED ORDER — IOHEXOL 300 MG/ML  SOLN
100.0000 mL | Freq: Once | INTRAMUSCULAR | Status: AC | PRN
  Administered 2012-04-14: 100 mL via INTRAVENOUS

## 2012-04-14 NOTE — Patient Instructions (Signed)
Continue all other medications as before No new medications or treatment at this time  - we will need further evaluation first Please go to LAB in the Basement for the blood and/or urine tests to be done today You will be contacted regarding the referral for: CT abd and pelvis to see about kidney stone or anything else - to see [facility] now  You will be contacted by phone if any changes need to be made immediately.  Otherwise, you will receive a letter about your results with an explanation. Please see Dr Debby Bud in follow up next Mon or Tues

## 2012-04-14 NOTE — Assessment & Plan Note (Addendum)
And left flank , afeb, benign exam  - suspect ? renal stone vs other, for labs today, and CT abd/pelvis (has not eaten today), consider urology but should also f/u with PCP next mon/tues, hold antibx and pain meds at this time  Note:  Time for pt hx and exam, review of record with pt in room, determination of diagnoses and plan for further eval and tx is > 40 min

## 2012-04-14 NOTE — Assessment & Plan Note (Signed)
Suspect ? related to her depression - for TSH today, f/u with PCP

## 2012-04-14 NOTE — Progress Notes (Signed)
Subjective:    Patient ID: [name], female    DOB: 27-Nov-1946, 66 y.o.   MRN: [id number]  HPI  Here with recurrence of problem she has had in prior yrs she thinks, with 1 mo onset mild though sometimes severe pain (not able to characterize such as dull or crampy though sometimes sharp and "just hurts") to the mid lower abd area and LLQ with sometimes radiates to the back, seems to start in the front to her;  Constant, not affected by change in position such as sit ,stand or bend at waist, nothing makes better, not affected by having BM's, and no GU change such as urinary symptoms such as dysuria, frequency, urgency,or hematuria.  Does point to the left flank area with recurring pain as well.  No blood of any sort, no overt bleeding.  No personal hx of renal stone.  Father had kidney stone. No fever but felt somewhat warm last night.  Did mention a hallucination last night she remembers talking to family member for some time who was not actually there she realizes this am, no prior hx of hallucination.  Has had "loose stools"  - not watery diarrhea for about 1-2 mo, onset before any abd pain.  States has mild depressive symptoms but not suicidal, takes klonopin for nerves.  Did not take klonopin recent or last night.  Pt denies chest pain, increased sob or doe, wheezing, orthopnea, PND, increased LE swelling, palpitations, dizziness or syncope.  Checks CBG's bid - usually in the 100 range, no low sugars.   Past Medical History  Diagnosis Date  . Personal history of colonic polyps   . Iron deficiency anemia, unspecified   . Dizziness   . Other malaise and fatigue   . Chest pain, unspecified   . Other B-complex deficiencies   . Arthritis     Right Sacroiliac joint  . Other and unspecified hyperlipidemia   . Anxiety and depression   . HTN (hypertension)   . Unspecified hypothyroidism   . Type II or unspecified type diabetes mellitus without mention of complication, not stated as uncontrolled     . Allergic rhinitis, cause unspecified   . Unspecified asthma   . Chronic airway obstruction, not elsewhere classified   . Abnormal stress test 08/21/2008    Neg  . GERD (gastroesophageal reflux disease)    Past Surgical History  Procedure Date  . Foot surgery     hammer toe 2004, 2005  . Abdominal hysterectomy 1982    menometorrhagia  . Carpal tunnel release 03/05/2009    bilateral    reports that she has quit smoking. She does not have any smokeless tobacco history on file. She reports that she does not drink alcohol or use illicit drugs. family history includes Allergies in her brother; Asthma in her brother; Breast cancer in her sister; Coronary artery disease in her mother; Diabetes in her father and mother; Heart disease in her mother; Hypertension in her father and mother; Liver cancer in her father; Lung cancer in her father; Lung disease in her brother; and Prostate cancer in her father.  There is no history of Colon cancer. Allergies  Allergen Reactions  . Aspirin Itching  . Codeine Itching  . Demerol     Unknown-unconscious.  . Latex Itching  . Lisinopril Itching and Cough    REACTION: causes cough  . Meperidine Hcl Itching   Current Outpatient Prescriptions on File Prior to Visit  Medication Sig Dispense Refill  . albuterol (  PROAIR HFA) 108 (90 BASE) MCG/ACT inhaler Inhale 2 puffs into the lungs every 6 (six) hours as needed. For SOB.  1 Inhaler  11  . albuterol (PROVENTIL) (2.5 MG/3ML) 0.083% nebulizer solution Take 2.5 mg by nebulization every 6 (six) hours as needed. For SOB  75 mL  11  . amLODipine (NORVASC) 5 MG tablet 5 mg daily.       Marland Kitchen aspirin-acetaminophen-caffeine (EXCEDRIN EXTRA STRENGTH) 250-250-65 MG per tablet Take 1 tablet by mouth every 6 (six) hours as needed. Per bottle instructions.PAIN      . azelastine (ASTELIN) 137 MCG/SPRAY nasal spray Place 1 spray into the nose at bedtime. Use in each nostril as directed  30 mL  5  . budesonide-formoterol  (SYMBICORT) 80-4.5 MCG/ACT inhaler Inhale 2 puffs into the lungs 2 (two) times daily.  1 Inhaler  11  . clonazePAM (KLONOPIN) 1 MG tablet Take 1-2 tablets (1-2 mg total) by mouth at bedtime as needed for anxiety.  180 tablet  1  . doxycycline (VIBRA-TABS) 100 MG tablet Take 2 tablets today then 1 tablet daily until gone.  8 tablet  0  . doxycycline (VIBRAMYCIN) 100 MG capsule TAKE 2 CAPSULES TODAY THEN 1 CAPSULE BY MOUTH DAILY UNTIL GONE  8 capsule  4  . esomeprazole (NEXIUM) 40 MG capsule Take 40 mg by mouth 2 (two) times daily.        Marland Kitchen ezetimibe (ZETIA) 10 MG tablet Take 10 mg by mouth at bedtime.      Marland Kitchen ezetimibe (ZETIA) 10 MG tablet Take 1 tablet (10 mg total) by mouth daily.  90 tablet  1  . ferrous sulfate 325 (65 FE) MG EC tablet Take 325 mg by mouth at bedtime.       . furosemide (LASIX) 40 MG tablet Take 1 tablet (40 mg total) by mouth daily. For HTN.  30 tablet  5  . glucosamine-chondroitin 500-400 MG tablet Take 1 tablet by mouth 2 (two) times daily.      Marland Kitchen glucose blood test strip 1 each by Other route as needed. Use as instructed. 1 test strip every CBG check/4 times a day.       Marland Kitchen HYDROcodone-homatropine (HYCODAN) 5-1.5 MG/5ML syrup Take 2.5 mLs by mouth every 6 (six) hours as needed. 1-1.5 teaspoon every 4-6 hours as needed for cough, may make you sleepy.       . insulin glargine (LANTUS) 100 UNIT/ML injection Inject 40 Units into the skin daily.  20 mL  3  . Insulin Pen Needle (NOVOFINE) 30G X 8 MM MISC Inject 10 each into the skin daily.  100 each  3  . INSULIN SYRINGE 1CC/29G 29G X 1/2" 1 ML MISC USE AS DIRECTED WITH LANTUS  100 each  3  . Lancets MISC by Does not apply route. Patient's choice. 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 Test 3 times a day as needed.       Marland Kitchen levothyroxine (SYNTHROID) 88 MCG tablet Take 1 tablet (88 mcg total) by mouth daily.  90 tablet  3  . metFORMIN (GLUCOPHAGE) 1000 MG tablet Take 1 tablet (1,000 mg total) by mouth 2 (two) times daily with a meal.  180 tablet   3  . Multiple Vitamins-Minerals (ICAPS MV) TABS Take 1 tablet by mouth at bedtime.       . predniSONE (DELTASONE) 10 MG tablet Take 10 mg by mouth daily. As directed.       . tiotropium (SPIRIVA HANDIHALER) 18 MCG inhalation capsule Place 1 capsule (18  mcg total) into inhaler and inhale daily.  90 capsule  3  . valsartan (DIOVAN) 160 MG tablet Take 1 tablet (160 mg total) by mouth daily.  90 tablet  3  . venlafaxine (EFFEXOR) 75 MG tablet Take 1 tablet (75 mg total) by mouth 2 (two) times daily.  60 tablet  11  . WELCHOL 3.75 G PACK TAKE 1 PACKET BY MOUTH ONCE DAILY  30 each  1  . DISCONTD: Calcium Carbonate-Vitamin D (CALCIUM 600/VITAMIN D) 600-400 MG-UNIT per tablet Take 1 tablet by mouth 2 (two) times daily.        Marland Kitchen DISCONTD: sodium chloride (OCEAN) 0.65 % nasal spray Place 2 sprays into the nose as needed. TO OPEN NASAL BREATHING       Review of Systems Constitutional: Negative for diaphoresis and unexpected weight change.  HENT: Negative for drooling and tinnitus.   Eyes: Negative for photophobia and visual disturbance.  Respiratory: Negative for choking and stridor.   Gastrointestinal: Negative for vomiting and blood in stool.  Genitourinary: Negative for hematuria and decreased urine volume.  Musculoskeletal: Negative for gait problem. though has fallen in the past month Skin: Negative for color change and wound.  Neurological: Negative for tremors and numbness.  Psychiatric/Behavioral: Negative for decreased concentration.    Objective:   Physical Exam BP 102/64  Pulse 70  Temp(Src) 98.5 F (36.9 C) (Oral)  Ht 5\' 3"  (1.6 m)  Wt 220 lb (99.791 kg)  BMI 38.97 kg/m2  SpO2 95% Physical Exam  VS noted, obese  Constitutional: Pt appears well-developed and well-nourished.  HENT: Head: Normocephalic.  Right Ear: External ear normal.  Left Ear: External ear normal.  Eyes: Conjunctivae and EOM are normal. Pupils are equal, round, and reactive to light.  Neck: Normal range of  motion. Neck supple.  Cardiovascular: Normal rate and regular rhythm.   Pulmonary/Chest: Effort normal and breath sounds normal.  Abd:  Soft, NT, non-distended, + BS - benign  exam Neurological: Pt is alert. Not confused, motor/dtr/gait intact Spine nontender except very low lumbar midline which does Not reproduce her pain Skin: Skin is warm. No erythema. No rash Psychiatric: Pt behavior is normal. Thought content normal. though probably dysphoric/depressed affect    Assessment & Plan:

## 2012-04-14 NOTE — Assessment & Plan Note (Signed)
Suspect IBS relatde, incidental with benign exam today, though cant completely r/o related to LLQ pain - for other w/u today and consider further eval or GI referral per PCP

## 2012-04-16 ENCOUNTER — Encounter: Payer: Self-pay | Admitting: Internal Medicine

## 2012-04-16 NOTE — Assessment & Plan Note (Signed)
stable overall by hx and exam, most recent data reviewed with pt, and pt to continue medical treatment as before Lab Results  Component Value Date   HGBA1C 8.0* 04/14/2012

## 2012-04-18 ENCOUNTER — Ambulatory Visit: Payer: Medicare Other | Admitting: Internal Medicine

## 2012-04-18 DIAGNOSIS — Z0289 Encounter for other administrative examinations: Secondary | ICD-10-CM

## 2012-05-05 ENCOUNTER — Ambulatory Visit: Payer: Medicare Other | Admitting: Internal Medicine

## 2012-05-05 DIAGNOSIS — Z0289 Encounter for other administrative examinations: Secondary | ICD-10-CM

## 2012-05-10 ENCOUNTER — Ambulatory Visit (INDEPENDENT_AMBULATORY_CARE_PROVIDER_SITE_OTHER): Payer: Medicare Other

## 2012-05-10 DIAGNOSIS — J309 Allergic rhinitis, unspecified: Secondary | ICD-10-CM

## 2012-05-11 ENCOUNTER — Encounter (HOSPITAL_COMMUNITY): Payer: Self-pay | Admitting: Emergency Medicine

## 2012-05-11 ENCOUNTER — Emergency Department (HOSPITAL_COMMUNITY)
Admission: EM | Admit: 2012-05-11 | Discharge: 2012-05-12 | Disposition: A | Discharge status: Home / Self Care | Payer: Medicare HMO | Attending: Emergency Medicine | Admitting: Emergency Medicine

## 2012-05-11 ENCOUNTER — Emergency Department (HOSPITAL_COMMUNITY): Payer: Medicare HMO

## 2012-05-11 ENCOUNTER — Telehealth: Payer: Self-pay | Admitting: Internal Medicine

## 2012-05-11 DIAGNOSIS — R443 Hallucinations, unspecified: Secondary | ICD-10-CM | POA: Insufficient documentation

## 2012-05-11 DIAGNOSIS — E039 Hypothyroidism, unspecified: Secondary | ICD-10-CM | POA: Insufficient documentation

## 2012-05-11 DIAGNOSIS — E119 Type 2 diabetes mellitus without complications: Secondary | ICD-10-CM | POA: Insufficient documentation

## 2012-05-11 DIAGNOSIS — K219 Gastro-esophageal reflux disease without esophagitis: Secondary | ICD-10-CM | POA: Insufficient documentation

## 2012-05-11 DIAGNOSIS — Z8739 Personal history of other diseases of the musculoskeletal system and connective tissue: Secondary | ICD-10-CM | POA: Insufficient documentation

## 2012-05-11 DIAGNOSIS — Z79899 Other long term (current) drug therapy: Secondary | ICD-10-CM | POA: Insufficient documentation

## 2012-05-11 DIAGNOSIS — I10 Essential (primary) hypertension: Secondary | ICD-10-CM | POA: Insufficient documentation

## 2012-05-11 DIAGNOSIS — J449 Chronic obstructive pulmonary disease, unspecified: Secondary | ICD-10-CM | POA: Insufficient documentation

## 2012-05-11 DIAGNOSIS — F341 Dysthymic disorder: Secondary | ICD-10-CM | POA: Insufficient documentation

## 2012-05-11 DIAGNOSIS — J4489 Other specified chronic obstructive pulmonary disease: Secondary | ICD-10-CM | POA: Insufficient documentation

## 2012-05-11 MED ORDER — FLUCONAZOLE 150 MG PO TABS: 150.0000 mg | ORAL_TABLET | Freq: Once | ORAL | Status: DC

## 2012-05-11 NOTE — ED Notes (Signed)
ZOX:[id number]<AV> Expected date:05/11/12<BR> Expected time:<BR> Means of arrival:<BR> Comments:<BR> EMS 211 GC - hallucinating

## 2012-05-11 NOTE — ED Notes (Signed)
Pt family members state that she has reported seeing some of them before when they weren't there.

## 2012-05-11 NOTE — Telephone Encounter (Signed)
Per CY-okay to give Diflucan 150 mg #3 take 1 po qd x 3 days no refills. Rx sent.   Unable to reach patient-will need to try again to let patient know Rx sent.

## 2012-05-11 NOTE — ED Notes (Addendum)
Report per EMS. Daughter said the mother was seeing a little girl on the corner. According to a neighbor or friend there was a little girl on the corner, but when the daughter got there she didn't see any little girl. The daughter said the pt has been seeing and talking to the pt's mother who passed away in 19-May-2004. [name] says she's not seeing her mother, she knows she's dead, and from time to time she has nostalgic conversations with her. The pt does not admit to seeing her deceased mother. The daughter did say the mother is not acting right. Initial VS CBG 155 BP 172/100 HR 80 and regular RR 16 at 2157-05-19. Skin was cool and diaphoretic, but they were outside and the temperature was high. Negative on [place] Stroke Scale. Passed [place] Coma Scale. Hx of DM, HTN, Hyperthyroidism. No IV.

## 2012-05-11 NOTE — Telephone Encounter (Signed)
I spoke with the pt and she states she developed a rash on her upper body x 1 week ago. She states it has spread and now covers her back, abdomen and shoulders. She states it is small, red, raised bumps that itch. She states she has taken benadryl without relief. Please advise. Carron Curie, CMA Allergies  Allergen Reactions  . Aspirin Itching  . Codeine Itching  . Demerol     Unknown-unconscious.  . Latex Itching  . Lisinopril Itching and Cough    REACTION: causes cough  . Meperidine Hcl Itching

## 2012-05-12 ENCOUNTER — Telehealth: Payer: Self-pay

## 2012-05-12 LAB — BASIC METABOLIC PANEL
BUN: 16 mg/dL (ref 6–23)
CO2: 26 mEq/L (ref 19–32)
Chloride: 99 mEq/L (ref 96–112)
Creatinine, Ser: 0.76 mg/dL (ref 0.50–1.10)
Glucose, Bld: 179 mg/dL — ABNORMAL HIGH (ref 70–99)
Potassium: 3.4 mEq/L — ABNORMAL LOW (ref 3.5–5.1)

## 2012-05-12 LAB — CBC
HCT: 34.9 % — ABNORMAL LOW (ref 36.0–46.0)
Hemoglobin: 11.8 g/dL — ABNORMAL LOW (ref 12.0–15.0)
MCV: 87.5 fL (ref 78.0–100.0)
RBC: 3.99 MIL/uL (ref 3.87–5.11)
WBC: 7.7 10*3/uL (ref 4.0–10.5)

## 2012-05-12 LAB — URINALYSIS, ROUTINE W REFLEX MICROSCOPIC
Glucose, UA: >1000 mg/dL — AB
Protein, ur: NEGATIVE mg/dL
Specific Gravity, Urine: 1.033 — ABNORMAL HIGH (ref 1.005–1.030)
pH: 5.5 (ref 5.0–8.0)

## 2012-05-12 LAB — URINE MICROSCOPIC-ADD ON

## 2012-05-12 NOTE — Telephone Encounter (Signed)
k x 5 

## 2012-05-12 NOTE — ED Provider Notes (Signed)
History     CSN: [id number]  Arrival date & time 05/11/12  2231   First MD Initiated Contact with Patient 05/11/12 2311      Chief Complaint  Patient presents with  . Hallucinations    (Consider location/radiation/quality/duration/timing/severity/associated sxs/prior treatment) HPI Brought in by family who provides history in addition to the patient. Patient currently lives alone. She had been living with her daughter and son-in-law who have left town for work and took patient's car with them. Patient does not have a way to get around. tHis has caused her some stress. Family concerned because today she had visual hallucinations, stated she saw a 68-year-old girl standing in the corner with light shoes white dress and family present did not see anyone there. She has also been talking to her mother who is deceased about 8 years ago.  These hallucinations come and go. No new medications. No recent illness. No history of dementia. Patient denies history of depression. Past Medical History  Diagnosis Date  . Personal history of colonic polyps   . Iron deficiency anemia, unspecified   . Dizziness   . Other malaise and fatigue   . Chest pain, unspecified   . Other B-complex deficiencies   . Arthritis     Right Sacroiliac joint  . Other and unspecified hyperlipidemia   . Anxiety and depression   . HTN (hypertension)   . Unspecified hypothyroidism   . Type II or unspecified type diabetes mellitus without mention of complication, not stated as uncontrolled   . Allergic rhinitis, cause unspecified   . Unspecified asthma   . Chronic airway obstruction, not elsewhere classified   . Abnormal stress test 08/21/2008    Neg  . GERD (gastroesophageal reflux disease)     Past Surgical History  Procedure Date  . Foot surgery     hammer toe 2004, 2005  . Abdominal hysterectomy 1982    menometorrhagia  . Carpal tunnel release 03/05/2009    bilateral    Family History  Problem Relation Age  of Onset  . Hypertension Mother   . Diabetes Mother   . Heart disease Mother     CHF  . Coronary artery disease Mother   . Lung cancer Father   . Hypertension Father   . Diabetes Father   . Liver cancer Father     w/mets  . Prostate cancer Father   . Breast cancer Sister   . Lung disease Brother     Agent Orange Related  . Asthma Brother   . Allergies Brother   . Colon cancer Neg Hx     History  Substance Use Topics  . Smoking status: Former Games developer  . Smokeless tobacco: Not on file   Comment: Quit 9 years ago.  . Alcohol Use: No    OB History    Grav Para Term Preterm Abortions TAB SAB Ect Mult Living                  Review of Systems  Constitutional: Negative for fever and chills.  HENT: Negative for neck pain and neck stiffness.   Eyes: Negative for pain.  Respiratory: Negative for shortness of breath.   Cardiovascular: Negative for chest pain.  Gastrointestinal: Negative for abdominal pain.  Genitourinary: Negative for dysuria.  Musculoskeletal: Negative for back pain.  Skin: Negative for rash.  Neurological: Negative for headaches.  Psychiatric/Behavioral: Positive for hallucinations.  All other systems reviewed and are negative.    Allergies  Aspirin; Codeine; Demerol;  Latex; Lisinopril; and Meperidine hcl  Home Medications   Current Outpatient Rx  Name Route Sig Dispense Refill  . ALBUTEROL SULFATE HFA 108 (90 BASE) MCG/ACT IN AERS Inhalation Inhale 2 puffs into the lungs every 6 (six) hours as needed. For SOB. 1 Inhaler 11  . ALBUTEROL SULFATE (2.5 MG/3ML) 0.083% IN NEBU Nebulization Take 2.5 mg by nebulization every 6 (six) hours as needed. For SOB 75 mL 11  . ASPIRIN-ACETAMINOPHEN-CAFFEINE 250-250-65 MG PO TABS Oral Take 1 tablet by mouth every 6 (six) hours as needed. Per bottle instructions.PAIN    . AZELASTINE HCL 137 MCG/SPRAY NA SOLN Nasal Place 1 spray into the nose at bedtime. Use in each nostril as directed 30 mL 5  .  BUDESONIDE-FORMOTEROL FUMARATE 80-4.5 MCG/ACT IN AERO Inhalation Inhale 2 puffs into the lungs 2 (two) times daily. 1 Inhaler 11  . CLONAZEPAM 1 MG PO TABS Oral Take 0.5 mg by mouth at bedtime as needed. Anxiety    . ESOMEPRAZOLE MAGNESIUM 40 MG PO CPDR Oral Take 40 mg by mouth 2 (two) times daily.      Marland Kitchen EZETIMIBE 10 MG PO TABS Oral Take 1 tablet (10 mg total) by mouth daily. 90 tablet 1  . FERROUS SULFATE 325 (65 FE) MG PO TBEC Oral Take 325 mg by mouth at bedtime.     . FUROSEMIDE 40 MG PO TABS Oral Take 1 tablet (40 mg total) by mouth daily. For HTN. 30 tablet 5  . GLUCOSAMINE-CHONDROITIN 500-400 MG PO TABS Oral Take 1 tablet by mouth 2 (two) times daily.    Marland Kitchen HYDROCODONE-ACETAMINOPHEN 5-325 MG PO TABS Oral Take 1 tablet by mouth 2 (two) times daily as needed. Pain    . INSULIN GLARGINE 100 UNIT/ML [place] SOLN Subcutaneous Inject 40 Units into the skin daily. 20 mL 3  . LEVOTHYROXINE SODIUM 88 MCG PO TABS Oral Take 1 tablet (88 mcg total) by mouth daily. 90 tablet 3  . METFORMIN HCL 1000 MG PO TABS Oral Take 1 tablet (1,000 mg total) by mouth 2 (two) times daily with a meal. 180 tablet 3  . ICAPS MV PO TABS Oral Take 1 tablet by mouth at bedtime.     Marland Kitchen PREDNISONE 10 MG PO TABS Oral Take 10 mg by mouth daily. As directed.     Marland Kitchen TIOTROPIUM BROMIDE MONOHYDRATE 18 MCG IN CAPS Inhalation Place 1 capsule (18 mcg total) into inhaler and inhale daily. 90 capsule 3  . VALSARTAN 160 MG PO TABS Oral Take 1 tablet (160 mg total) by mouth daily. 90 tablet 3  . VENLAFAXINE HCL 75 MG PO TABS Oral Take 1 tablet (75 mg total) by mouth 2 (two) times daily. 60 tablet 11  . WELCHOL 3.75 G PO PACK  TAKE 1 PACKET BY MOUTH ONCE DAILY 30 each 1    BP 191/76  Pulse 74  Temp 98.4 F (36.9 C) (Oral)  Resp 18  SpO2 98%  Physical Exam  Constitutional: She is oriented to person, place, and time. She appears well-developed and well-nourished.  HENT:  Head: Normocephalic and atraumatic.  Eyes: Conjunctivae and EOM are  normal. Pupils are equal, round, and reactive to light.  Neck: Trachea normal. Neck supple. No thyromegaly present.  Cardiovascular: Normal rate, regular rhythm, S1 normal, S2 normal and normal pulses.     No systolic murmur is present   No diastolic murmur is present  Pulses:      Radial pulses are 2+ on the right side, and 2+ on  the left side.  Pulmonary/Chest: Effort normal and breath sounds normal. She has no wheezes. She has no rhonchi. She has no rales. She exhibits no tenderness.  Abdominal: Soft. Normal appearance and bowel sounds are normal. There is no tenderness. There is no CVA tenderness and negative Murphy's sign.  Musculoskeletal:       BLE:s Calves nontender, no cords or erythema, negative Homans sign  Neurological: She is alert and oriented to person, place, and time. She has normal strength. No cranial nerve deficit or sensory deficit. GCS eye subscore is 4. GCS verbal subscore is 5. GCS motor subscore is 6.  Skin: Skin is warm and dry. No rash noted. She is not diaphoretic.  Psychiatric: She has a normal mood and affect. Her speech is normal and behavior is normal. Judgment and thought content normal.       Cooperative and appropriate    ED Course  Procedures (including critical care time)  Labs Reviewed  CBC - Abnormal; Notable for the following:    Hemoglobin 11.8 (*)     HCT 34.9 (*)     All other components within normal limits  BASIC METABOLIC PANEL - Abnormal; Notable for the following:    Potassium 3.4 (*)     Glucose, Bld 179 (*)     GFR calc non Af Amer 85 (*)     All other components within normal limits  GLUCOSE, CAPILLARY - Abnormal; Notable for the following:    Glucose-Capillary 173 (*)     All other components within normal limits  URINALYSIS, ROUTINE W REFLEX MICROSCOPIC - Abnormal; Notable for the following:    Specific Gravity, Urine 1.033 (*)     Glucose, UA >1000 (*)     Hgb urine dipstick TRACE (*)     All other components within normal  limits  URINE MICROSCOPIC-ADD ON   Dg Chest 2 View  05/12/2012  *RADIOLOGY REPORT*  Clinical Data: Ex-smoker.  COPD.  High blood pressure and hallucinations.  CHEST - 2 VIEW  Comparison: 02/12/2012  Findings: Lateral view degraded by patient arm position.  Numerous leads and wires project over the chest.  Midline trachea. Normal heart size and mediastinal contours for age.  No pleural effusion or pneumothorax.  Diffuse peribronchial thickening.  Clear lungs.  IMPRESSION: 1.  No acute cardiopulmonary disease. 2.  Mild peribronchial thickening which may relate to chronic bronchitis or smoking.  Original Report Authenticated By: Consuello Bossier, M.D.   Ct Head Wo Contrast  05/12/2012  *RADIOLOGY REPORT*  Clinical Data: Mental status changes and hallucinations.  CT HEAD WITHOUT CONTRAST  Technique:  Contiguous axial images were obtained from the base of the skull through the vertex without contrast.  Comparison: 02/12/2012  Findings: Bone windows demonstrate clear paranasal sinuses and mastoid air cells.  Soft tissue windows demonstrate mild cerebral atrophy which is similar.  This results in prominence of the extraaxial spaces. Mild ventriculomegaly.  Left periventricular white matter occipital lobe tiny area of remote ischemia.  This is unchanged.  No  mass lesion, hemorrhage, hydrocephalus, acute infarct, intra- axial, or extra-axial fluid collection.  IMPRESSION:  1. No acute intracranial abnormality. 2.  Mildly age advanced cerebral and cerebellar atrophy.  Mild small vessel ischemic change.  Original Report Authenticated By: Consuello Bossier, M.D.     Date: 05/12/2012  Rate: 74  Rhythm: normal sinus rhythm  QRS Axis: left  Intervals: normal  ST/T Wave abnormalities: nonspecific ST/T changes  Conduction Disutrbances:none  Narrative Interpretation: LVH  Old EKG Reviewed: unchanged  Patient agreeable to workup as above. EKG, imaging, UA and labs obtained and reviewed as above.  On recheck patient  remains alert and oriented x3 and neurologically appropriate.  I had a long discussion with patient and family bedside and they are agreeable to close primary care followup. No indication for admission or further emergency department workup at this time. MDM   Intermittent hallucinations and confusion at home. Multiple family members at bedside. No clinical infectious etiology for symptoms. Possible medication related. Asymptomatic in the emergency department without psychosis or neuro deficits. Nursing notes reviewed. Vital signs reviewed. Plan discharge home. Followup primary care for recheck and review of medications. Patient may benefit from home health assessment.        Sunnie Nielsen, MD 05/12/12 [id number]

## 2012-05-12 NOTE — Telephone Encounter (Signed)
I atc # provided above - NA and no option to leave msg.  WCB

## 2012-05-12 NOTE — Discharge Instructions (Signed)
Dementia Dementia is a general term for problems with brain function. A person with dementia has memory loss and a hard time with at least one other brain function such as thinking, speaking, or problem solving. Dementia can affect social functioning, how you do your job, your mood, or your personality. The changes may be hidden for a long time. The earliest forms of this disease are usually not detected by family or friends. Dementia can be:  Irreversible.   Potentially reversible.   Partially reversible.   Progressive. This means it can get worse over time.  CAUSES  Irreversible dementia causes may include:  Degeneration of brain cells (Alzheimer's disease or lewy body dementia).   Multiple small strokes (vascular dementia).   Infection (chronic meningitis or Creutzfelt-Jakob disease).   Frontotemporal dementia. This affects younger people, age 40 to 70, compared to those who have Alzheimer's disease.   Dementia associated with other disorders like Parkinson's disease, Huntington's disease, or HIV-associated dementia.  Potentially or partially reversible dementia causes may include:  Medicines.   Metabolic causes such as excessive alcohol intake, vitamin B12 deficiency, or thyroid disease.   Masses or pressure in the brain such as a tumor, blood clot, or hydrocephalus.  SYMPTOMS  Symptoms are often hard to detect. Family members or coworkers may not notice them early in the disease process. Different people with dementia may have different symptoms. Symptoms can include:  A hard time with memory, especially recent memory. Long-term memory may not be impaired.   Asking the same question multiple times or forgetting something someone just said.   A hard time speaking your thoughts or finding certain words.   A hard time solving problems or performing familiar tasks (such as how to use a telephone).   Sudden changes in mood.   Changes in personality, especially increasing  moodiness or mistrust.   Depression.   A hard time understanding complex ideas that were never a problem in the past.  DIAGNOSIS  There are no specific tests for dementia.   Your caregiver may recommend a thorough evaluation. This is because some forms of dementia can be reversible. The evaluation will likely include a physical exam and getting a detailed history from you and a family member. The history often gives the best clues and suggestions for a diagnosis.   Memory testing may be done. A detailed brain function evaluation called neuropsychologic testing may be helpful.   Lab tests and brain imaging (such as a CT scan or MRI scan) are sometimes important.   Sometimes observation and re-evaluation over time is very helpful.  TREATMENT  Treatment depends on the cause.   If the problem is a vitamin deficiency, it may be helped or cured with supplements.   For dementias such as Alzheimer's disease, medicines are available to stabilize or slow the course of the disease. There are no cures for this type of dementia.   Your caregiver can help direct you to groups, organizations, and other caregivers to help with decisions in the care of you or your loved one.  HOME CARE INSTRUCTIONS The care of individuals with dementia is varied and dependent upon the progression of the dementia. The following suggestions are intended for the person living with, or caring for, the person with dementia.  Create a safe environment.   Remove the locks on bathroom doors to prevent the person from accidentally locking himself or herself in.   Use childproof latches on kitchen cabinets and any place where cleaning supplies, chemicals,   or alcohol are kept.   Use childproof covers in unused electrical outlets.   Install childproof devices to keep doors and windows secured.   Remove stove knobs or install safety knobs and an automatic shut-off on the stove.   Lower the temperature on water heaters.    Label medicines and keep them locked up.   Secure knives, lighters, matches, power tools, and guns, and keep these items out of reach.   Keep the house free from clutter. Remove rugs or anything that might contribute to a fall.   Remove objects that might break and hurt the person.   Make sure lighting is good, both inside and outside.   Install grab rails as needed.   Use a monitoring device to alert you to falls or other needs for help.   Reduce confusion.   Keep familiar objects and people around.   Use night lights or dim lights at night.   Label items or areas.   Use reminders, notes, or directions for daily activities or tasks.   Keep a simple, consistent routine for waking, meals, bathing, dressing, and bedtime.   Create a calm, quiet environment.   Place large clocks and calendars prominently.   Display emergency numbers and home address near all telephones.   Use cues to establish different times of the day. An example is to open curtains to let the natural light in during the day.    Use effective communication.   Choose simple words and short sentences.   Use a gentle, calm tone of voice.   Be careful not to interrupt.   If the person is struggling to find a word or communicate a thought, try to provide the word or thought.   Ask one question at a time. Allow the person ample time to answer questions. Repeat the question again if the person does not respond.   Reduce nighttime restlessness.   Provide a comfortable bed.   Have a consistent nighttime routine.   Ensure a regular walking or physical activity schedule. Involve the person in daily activities as much as possible.   Limit napping during the day.   Limit caffeine.   Attend social events that stimulate rather than overwhelm the senses.   Encourage good nutrition and hydration.   Reduce distractions during meal times and snacks.   Avoid foods that are too hot or too cold.    Monitor chewing and swallowing ability.   Continue with routine vision, hearing, dental, and medical screenings.   Only give over-the-counter or prescription medicines as directed by the caregiver.   Monitor driving abilities. Do not allow the person to drive when safe driving is no longer possible.   Register with an identification program which could provide location assistance in the event of a missing person situation.  SEEK MEDICAL CARE IF:   New behavioral problems start such as moodiness, aggressiveness, or seeing things that are not there (hallucinations).   Any new problem with brain function happens. This includes problems with balance, speech, or falling a lot.   Problems with swallowing develop.   Any symptoms of other illness happen.  Small changes or worsening in any aspect of brain function can be a sign that the illness is getting worse. It can also be a sign of another medical illness such as infection. Seeing a caregiver right away is important. SEEK IMMEDIATE MEDICAL CARE IF:   A fever develops.   New or worsened confusion develops.   New or worsened

## 2012-05-12 NOTE — Telephone Encounter (Signed)
Pt is aware and will pick the diflucan up later today.

## 2012-05-12 NOTE — Telephone Encounter (Signed)
Please advise if ok to refill clonazepam 1mg  1-2 tabs qhs. Thanks

## 2012-05-13 ENCOUNTER — Telehealth: Payer: Self-pay | Admitting: Internal Medicine

## 2012-05-13 MED ORDER — CLONAZEPAM 1 MG PO TABS: 0.5000 mg | ORAL_TABLET | Freq: Every evening | ORAL | Status: DC | PRN

## 2012-05-13 NOTE — Telephone Encounter (Signed)
The pt called and is hoping to get an apt with Dr.Harris at [facility] for dermatology issues.    Thanks!

## 2012-05-14 NOTE — Telephone Encounter (Signed)
Need to know what problem she is being referred for: rash, other lesion.  With more specific information can make a referral.

## 2012-05-16 ENCOUNTER — Other Ambulatory Visit: Payer: Self-pay | Admitting: *Deleted

## 2012-05-16 ENCOUNTER — Ambulatory Visit (INDEPENDENT_AMBULATORY_CARE_PROVIDER_SITE_OTHER): Payer: Medicaid Other | Admitting: Internal Medicine

## 2012-05-16 ENCOUNTER — Encounter: Payer: Self-pay | Admitting: Internal Medicine

## 2012-05-16 VITALS — BP 90/58 | HR 56 | Temp 97.8°F | Resp 16 | Wt 221.0 lb

## 2012-05-16 DIAGNOSIS — I10 Essential (primary) hypertension: Secondary | ICD-10-CM

## 2012-05-16 DIAGNOSIS — R443 Hallucinations, unspecified: Secondary | ICD-10-CM

## 2012-05-16 DIAGNOSIS — E119 Type 2 diabetes mellitus without complications: Secondary | ICD-10-CM

## 2012-05-16 MED ORDER — "SYRINGE/NEEDLE (DISP) 29G X 1/2"" 1 ML MISC": Status: DC
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
## 2012-05-16 NOTE — Telephone Encounter (Signed)
Patient notified she has appt. With Dr. Debby Bud .

## 2012-05-16 NOTE — Telephone Encounter (Signed)
Patient notified she has appt. Today with Dr. Debby Bud and is going to keep it.

## 2012-05-16 NOTE — Patient Instructions (Addendum)
Itching rash - worry about fleas. For itching take loratadine 10 mg twice as long as you itch, zantac 150 mg twice a day; tepid bath and can add aveeno  Memory - see a few changes. Best plan - repeat mental status exam in 3 months. For hallucinations - as long as the hallucinations are not threatening or disturbing would not treat - the medications that are used all have serious side effects.  Diabetes - last A1C 8.0% - goal is 7 %. Please continue your present medications, follow a NO SUGAR diet and low carbs.

## 2012-05-18 NOTE — Assessment & Plan Note (Signed)
BP Readings from Last 3 Encounters:  05/16/12 90/58  05/12/12 161/99  04/14/12 102/64   Generally good control.  Plan Continue present medications

## 2012-05-18 NOTE — Assessment & Plan Note (Signed)
MMSE reveals mild cognitive impairment and memory loss. Educational level is low and plays a role.  Plan -  Return visit in 3 months for repeat MMSE   No medication at this time, but will consider medical therapy if there is any change over the next interval.

## 2012-05-18 NOTE — Assessment & Plan Note (Signed)
Lab Results  Component Value Date   HGBA1C 8.0* 04/14/2012   Above goal but not at a point where it is mandatory to alter medications.  Plan Continue present regimen  Encouraged strict adherence to a no sugar low carb, calorie restricted diet.

## 2012-05-19 ENCOUNTER — Other Ambulatory Visit: Payer: Self-pay | Admitting: *Deleted

## 2012-05-19 ENCOUNTER — Encounter (HOSPITAL_COMMUNITY): Payer: Self-pay | Admitting: Emergency Medicine

## 2012-05-19 DIAGNOSIS — N390 Urinary tract infection, site not specified: Secondary | ICD-10-CM | POA: Insufficient documentation

## 2012-05-19 DIAGNOSIS — Z794 Long term (current) use of insulin: Secondary | ICD-10-CM | POA: Insufficient documentation

## 2012-05-19 DIAGNOSIS — Z87891 Personal history of nicotine dependence: Secondary | ICD-10-CM | POA: Insufficient documentation

## 2012-05-19 DIAGNOSIS — E039 Hypothyroidism, unspecified: Secondary | ICD-10-CM | POA: Insufficient documentation

## 2012-05-19 DIAGNOSIS — Z79899 Other long term (current) drug therapy: Secondary | ICD-10-CM | POA: Insufficient documentation

## 2012-05-19 DIAGNOSIS — K219 Gastro-esophageal reflux disease without esophagitis: Secondary | ICD-10-CM | POA: Insufficient documentation

## 2012-05-19 DIAGNOSIS — J449 Chronic obstructive pulmonary disease, unspecified: Secondary | ICD-10-CM | POA: Insufficient documentation

## 2012-05-19 DIAGNOSIS — I10 Essential (primary) hypertension: Secondary | ICD-10-CM | POA: Insufficient documentation

## 2012-05-19 DIAGNOSIS — E119 Type 2 diabetes mellitus without complications: Secondary | ICD-10-CM | POA: Insufficient documentation

## 2012-05-19 DIAGNOSIS — M129 Arthropathy, unspecified: Secondary | ICD-10-CM | POA: Insufficient documentation

## 2012-05-19 DIAGNOSIS — D509 Iron deficiency anemia, unspecified: Secondary | ICD-10-CM | POA: Insufficient documentation

## 2012-05-19 DIAGNOSIS — J4489 Other specified chronic obstructive pulmonary disease: Secondary | ICD-10-CM | POA: Insufficient documentation

## 2012-05-19 NOTE — ED Notes (Addendum)
PT. REPORTS EMESIS WITH DIARRHEA , SLIGHT ABDOMINAL CRAMPING ONSET YESTERDAY . DENIES FEVER OR CHILLS.  PT. STATES RAN OUT OF HER VENLAFAXINE MEDICATION FOR SEVERAL DAYS.

## 2012-05-19 NOTE — Telephone Encounter (Signed)
At the time of office visit for rash it appeared to be flea bites or something similar. Was not in need of systemic steroids, prednisone, at that time. Benadryl had been recommended for itching.

## 2012-05-19 NOTE — Telephone Encounter (Signed)
Pt's daughter called on behalf of pt requesting rx for Prednisone for rash patient is having. Okay to callback pt at [phone].

## 2012-05-20 ENCOUNTER — Telehealth: Payer: Self-pay

## 2012-05-20 ENCOUNTER — Emergency Department (HOSPITAL_COMMUNITY)
Admission: EM | Admit: 2012-05-20 | Discharge: 2012-05-20 | Disposition: A | Discharge status: Home / Self Care | Payer: Medicare HMO | Attending: Emergency Medicine | Admitting: Emergency Medicine

## 2012-05-20 DIAGNOSIS — N390 Urinary tract infection, site not specified: Secondary | ICD-10-CM

## 2012-05-20 LAB — DIFFERENTIAL
Lymphs Abs: 1.4 10*3/uL (ref 0.7–4.0)
Monocytes Relative: 4 % (ref 3–12)
Neutro Abs: 6.2 10*3/uL (ref 1.7–7.7)
Neutrophils Relative %: 78 % — ABNORMAL HIGH (ref 43–77)

## 2012-05-20 LAB — BASIC METABOLIC PANEL
Calcium: 10.4 mg/dL (ref 8.4–10.5)
GFR calc Af Amer: >90 mL/min (ref >90)
GFR calc non Af Amer: 87 mL/min — ABNORMAL LOW (ref >90)
Potassium: 4 mEq/L (ref 3.5–5.1)
Sodium: 137 mEq/L (ref 135–145)

## 2012-05-20 LAB — URINE MICROSCOPIC-ADD ON

## 2012-05-20 LAB — CBC
Hemoglobin: 13.5 g/dL (ref 12.0–15.0)
Platelets: 267 10*3/uL (ref 150–400)
RBC: 4.6 MIL/uL (ref 3.87–5.11)
WBC: 8 10*3/uL (ref 4.0–10.5)

## 2012-05-20 LAB — URINALYSIS, ROUTINE W REFLEX MICROSCOPIC
Glucose, UA: >1000 mg/dL — AB
Hgb urine dipstick: NEGATIVE
Protein, ur: 30 mg/dL — AB
Specific Gravity, Urine: 1.029 (ref 1.005–1.030)
Urobilinogen, UA: 0.2 mg/dL (ref 0.0–1.0)

## 2012-05-20 MED ORDER — CEPHALEXIN 500 MG PO CAPS: 500.0000 mg | ORAL_CAPSULE | Freq: Three times a day (TID) | ORAL | Status: AC

## 2012-05-20 MED ORDER — CEFTRIAXONE SODIUM 1 G IJ SOLR
1.0000 g | Freq: Once | INTRAMUSCULAR | Status: AC
  Administered 2012-05-20: 1 g via INTRAVENOUS
  Filled 2012-05-20: qty 10

## 2012-05-20 MED ORDER — SODIUM CHLORIDE 0.9 % IV BOLUS (SEPSIS)
1000.0000 mL | Freq: Once | INTRAVENOUS | Status: AC
  Administered 2012-05-20: 1000 mL via INTRAVENOUS

## 2012-05-20 MED ORDER — ONDANSETRON HCL 4 MG PO TABS: 4.0000 mg | ORAL_TABLET | Freq: Four times a day (QID) | ORAL | Status: AC

## 2012-05-20 MED ORDER — ONDANSETRON HCL 4 MG/2ML IJ SOLN
4.0000 mg | Freq: Once | INTRAMUSCULAR | Status: AC
  Administered 2012-05-20: 4 mg via INTRAVENOUS
  Filled 2012-05-20: qty 2

## 2012-05-20 NOTE — Telephone Encounter (Signed)
Called to inform pt, no answer/unable to leave message.

## 2012-05-20 NOTE — Discharge Instructions (Signed)
Return to the ED with any concerns including vomiting and not able to keep down liquids or your medications, abdominal pain especially if it localizes to the right lower abdomen, fever or chills, and decreased urine output, decreased level of alertness or lethargy, or any other alarming symptoms.  °

## 2012-05-20 NOTE — Telephone Encounter (Signed)
[facility] Triage Call Report Triage Record Num: [id number] Operator: Candida Peeling Patient Name: [name] Call Date & Time: 05/19/2012 8:06:53PM Patient Phone: [phone] PCP: Illene Regulus Patient Gender: Female PCP Fax : [phone] Patient DOB: 14-Sep-1946 Practice Name: [facility] Reason for Call: Caller: [place]/Patient; PCP: Illene Regulus; CB#: [phone]; Call regarding Poison Ivy; Onset 05/12/12 per caller began sxs, itching all over, unable to sleep, areas on back, arms, legs and abdomen. Afebrile. Has not put anything on areas, just taking showers. All emergent s/s r/o per Poison [place], [place] or [address] protocol. Home care advice given. Protocol(s) Used: [employer], [place], or [facility] Exposure Recommended Outcome per Protocol: Provide Home/Self Care Reason for Outcome: Itching, burning skin Care Advice: ~ Resting will help avoid overheating and sweating which will intensify the irritation. Apply cool compresses to affected area(s) for 20 minutes 4 to 6 times daily to help relieve itching and provide a topical anesthesia. ~ Calamine is appropriate if used as directed on the label or by a pharmacist. DO NOT use topical preparations with benzocaine because they can be sensitizing agents and can cause an allergic reaction. ~ ~ Call provider if there is no improvement in 5 days (symptoms can last 1-2 weeks). ~ SYMPTOM / CONDITION MANAGEMENT For symptom relief, consider nonprescription antihistamines (such as Allerest, Claritin, Zyrtec, Chlor-Trimetron, Benadryl, etc.) as directed on label or by pharmacist. Drowsiness may result, especially in geriatric patients. Non-sedating antihistamines are available without a prescription. ~ POISON IVY, OAK, AND SUMAC: * Call provider if you develop - an open oozing, painful rash, - signs of secondary infection (e.g. tenderness in the rash area, yellowish drainage, or soft yellow scabs), - fever of 101.5 F (38.6 C) or greater, -  increasing involvement of surrounding skin, - or severe discomfort. * Regardless of temperature, immunocompromised patients (such as diabetes, HIV/AIDS, chemotherapy, organ transplant, or chronic steroid use) must be evaluated by provider. ~ Itching Relief: - Avoid scratching or rubbing irritated area; may cause further irritation and secondary infection - Take cool showers or baths several times a day to relieve itching; do not use soap - If cool water alone does not relieve itching, try adding 1/2 to 1 cup baking soda to bath water - Follow with application of a bland lotion such as calamine (do not apply to the eyes or genitals). ~ 05/19/2012 8:29:03PM Page 1 of 1 [id number]

## 2012-05-20 NOTE — ED Provider Notes (Signed)
History     CSN: [id number]  Arrival date & time 05/19/12  2321   First MD Initiated Contact with Patient 05/20/12 0210      Chief Complaint  Patient presents with  . Emesis    (Consider location/radiation/quality/duration/timing/severity/associated sxs/prior treatment) HPI Pt presents with c/o emesis.  Sx started abruptly tonight and had multiple episodes.  Emesis nonbloody and nonbilious.  No diarrhea.  No abdominal pain.  No fever/chills. Pt had been feeling well earlier in the day until tonight.  There are no other associated systemic symptoms, there are no alleviating or modifying factors.   Past Medical History  Diagnosis Date  . Personal history of colonic polyps   . Iron deficiency anemia, unspecified   . Dizziness   . Other malaise and fatigue   . Chest pain, unspecified   . Other B-complex deficiencies   . Arthritis     Right Sacroiliac joint  . Other and unspecified hyperlipidemia   . Anxiety and depression   . HTN (hypertension)   . Unspecified hypothyroidism   . Type II or unspecified type diabetes mellitus without mention of complication, not stated as uncontrolled   . Allergic rhinitis, cause unspecified   . Unspecified asthma   . Chronic airway obstruction, not elsewhere classified   . Abnormal stress test 08/21/2008    Neg  . GERD (gastroesophageal reflux disease)     Past Surgical History  Procedure Date  . Foot surgery     hammer toe 2004, 2005  . Abdominal hysterectomy 1982    menometorrhagia  . Carpal tunnel release 03/05/2009    bilateral    Family History  Problem Relation Age of Onset  . Hypertension Mother   . Diabetes Mother   . Heart disease Mother     CHF  . Coronary artery disease Mother   . Lung cancer Father   . Hypertension Father   . Diabetes Father   . Liver cancer Father     w/mets  . Prostate cancer Father   . Breast cancer Sister   . Lung disease Brother     Agent Orange Related  . Asthma Brother   . Allergies  Brother   . Colon cancer Neg Hx     History  Substance Use Topics  . Smoking status: Former Games developer  . Smokeless tobacco: Not on file   Comment: Quit 9 years ago.  . Alcohol Use: No    OB History    Grav Para Term Preterm Abortions TAB SAB Ect Mult Living                  Review of Systems ROS reviewed and all otherwise negative except for mentioned in HPI  Allergies  Aspirin; Codeine; Demerol; Latex; Lisinopril; and Meperidine hcl  Home Medications   Current Outpatient Rx  Name Route Sig Dispense Refill  . ASPIRIN-ACETAMINOPHEN-CAFFEINE 250-250-65 MG PO TABS Oral Take 1 tablet by mouth every 6 (six) hours as needed. Per bottle instructions.PAIN    . AZELASTINE HCL 137 MCG/SPRAY NA SOLN Nasal Place 1 spray into the nose at bedtime. Use in each nostril as directed 30 mL 5  . BUDESONIDE-FORMOTEROL FUMARATE 80-4.5 MCG/ACT IN AERO Inhalation Inhale 2 puffs into the lungs 2 (two) times daily. 1 Inhaler 11  . CLONAZEPAM 1 MG PO TABS Oral Take 0.5 tablets (0.5 mg total) by mouth at bedtime as needed. Anxiety 30 tablet 5  . ESOMEPRAZOLE MAGNESIUM 40 MG PO CPDR Oral Take 40 mg by mouth  daily.     Marland Kitchen EZETIMIBE 10 MG PO TABS Oral Take 1 tablet (10 mg total) by mouth daily. 90 tablet 1  . FERROUS SULFATE 325 (65 FE) MG PO TBEC Oral Take 325 mg by mouth daily.     . FUROSEMIDE 40 MG PO TABS Oral Take 1 tablet (40 mg total) by mouth daily. For HTN. 30 tablet 5  . INSULIN GLARGINE 100 UNIT/ML [place] SOLN Subcutaneous Inject 40 Units into the skin daily. 20 mL 3  . LEVOTHYROXINE SODIUM 88 MCG PO TABS Oral Take 1 tablet (88 mcg total) by mouth daily. 90 tablet 3  . METFORMIN HCL 1000 MG PO TABS Oral Take 1 tablet (1,000 mg total) by mouth 2 (two) times daily with a meal. 180 tablet 3  . TIOTROPIUM BROMIDE MONOHYDRATE 18 MCG IN CAPS Inhalation Place 1 capsule (18 mcg total) into inhaler and inhale daily. 90 capsule 3  . VALSARTAN 160 MG PO TABS Oral Take 1 tablet (160 mg total) by mouth daily. 90  tablet 3  . VENLAFAXINE HCL 75 MG PO TABS Oral Take 1 tablet (75 mg total) by mouth 2 (two) times daily. 60 tablet 11  . CEPHALEXIN 500 MG PO CAPS Oral Take 1 capsule (500 mg total) by mouth 3 (three) times daily. 30 capsule 0  . ADULT MULTIVITAMIN W/MINERALS CH Oral Take 1 tablet by mouth daily.    Marland Kitchen ONDANSETRON HCL 4 MG PO TABS Oral Take 1 tablet (4 mg total) by mouth every 6 (six) hours. 12 tablet 0  . SYRINGE/NEEDLE (DISP) 29G X 1/2" 1 ML MISC  Use as directed with lantus 200 each 0    BP 173/85  Pulse 73  Temp 97.7 F (36.5 C) (Oral)  Resp 20  SpO2 99% Vitals reviewed Physical Exam Physical Examination: General appearance - alert, well appearing, and in no distress Mental status - alert, oriented to person, place, and time Eyes - pupils equal and reactive, extraocular eye movements intact Mouth - mucous membranes moist, pharynx normal without lesions Chest - clear to auscultation, no wheezes, rales or rhonchi, symmetric air entry Heart - normal rate, regular rhythm, normal S1, S2, no murmurs, rubs, clicks or gallops Abdomen - soft, nontender, nondistended, no masses or organomegaly, nabs Extremities - peripheral pulses normal, no pedal edema, no clubbing or cyanosis Skin - normal coloration and turgor, no rashes, brisk cap refill  ED Course  Procedures (including critical care time)  5:07 AM pt feeling much improved.  Tolerating fluids in the ED.  Has been given rocephin IV for UTI.    Labs Reviewed  BASIC METABOLIC PANEL - Abnormal; Notable for the following:    Glucose, Bld 351 (*)     GFR calc non Af Amer 87 (*)     All other components within normal limits  DIFFERENTIAL - Abnormal; Notable for the following:    Neutrophils Relative 78 (*)     All other components within normal limits  URINALYSIS, ROUTINE W REFLEX MICROSCOPIC - Abnormal; Notable for the following:    APPearance CLOUDY (*)     Glucose, UA >1000 (*)     Ketones, ur 15 (*)     Protein, ur 30 (*)      Leukocytes, UA SMALL (*)     All other components within normal limits  URINE MICROSCOPIC-ADD ON - Abnormal; Notable for the following:    Bacteria, UA FEW (*)     Casts HYALINE CASTS (*)     All other  components within normal limits  CBC  URINE CULTURE  LAB REPORT - SCANNED   No results found.   1. Urinary tract infection       MDM  Pt presenting with acute onset of emesis- feeling much better after IV hydration and meds. Tolerating po fluids in ED.  Abdominal exam benign.  Discharged with strict return precautions.  Pt and family at bedside are agreeable with the plan for discharge after completing rocephin for UTI.  Will d/c with rx for keflex.         Ethelda Chick, MD 05/23/12 2033

## 2012-05-21 LAB — URINE CULTURE: Culture  Setup Time: 201306210615

## 2012-05-23 ENCOUNTER — Encounter (HOSPITAL_COMMUNITY): Payer: Self-pay | Admitting: *Deleted

## 2012-05-23 ENCOUNTER — Emergency Department (HOSPITAL_COMMUNITY)
Admission: EM | Admit: 2012-05-23 | Discharge: 2012-05-23 | Disposition: A | Discharge status: Home / Self Care | Payer: Medicare HMO | Attending: Emergency Medicine | Admitting: Emergency Medicine

## 2012-05-23 DIAGNOSIS — F411 Generalized anxiety disorder: Secondary | ICD-10-CM | POA: Insufficient documentation

## 2012-05-23 DIAGNOSIS — Z79899 Other long term (current) drug therapy: Secondary | ICD-10-CM | POA: Insufficient documentation

## 2012-05-23 DIAGNOSIS — J4489 Other specified chronic obstructive pulmonary disease: Secondary | ICD-10-CM | POA: Insufficient documentation

## 2012-05-23 DIAGNOSIS — J449 Chronic obstructive pulmonary disease, unspecified: Secondary | ICD-10-CM | POA: Insufficient documentation

## 2012-05-23 DIAGNOSIS — F3289 Other specified depressive episodes: Secondary | ICD-10-CM | POA: Insufficient documentation

## 2012-05-23 DIAGNOSIS — F329 Major depressive disorder, single episode, unspecified: Secondary | ICD-10-CM | POA: Insufficient documentation

## 2012-05-23 DIAGNOSIS — R110 Nausea: Secondary | ICD-10-CM | POA: Insufficient documentation

## 2012-05-23 DIAGNOSIS — E119 Type 2 diabetes mellitus without complications: Secondary | ICD-10-CM | POA: Insufficient documentation

## 2012-05-23 DIAGNOSIS — R109 Unspecified abdominal pain: Secondary | ICD-10-CM | POA: Insufficient documentation

## 2012-05-23 DIAGNOSIS — Z794 Long term (current) use of insulin: Secondary | ICD-10-CM | POA: Insufficient documentation

## 2012-05-23 DIAGNOSIS — K219 Gastro-esophageal reflux disease without esophagitis: Secondary | ICD-10-CM | POA: Insufficient documentation

## 2012-05-23 LAB — BASIC METABOLIC PANEL
CO2: 28 mEq/L (ref 19–32)
Chloride: 95 mEq/L — ABNORMAL LOW (ref 96–112)
Creatinine, Ser: 0.95 mg/dL (ref 0.50–1.10)
GFR calc Af Amer: 70 mL/min — ABNORMAL LOW (ref >90)
Potassium: 3.7 mEq/L (ref 3.5–5.1)
Sodium: 135 mEq/L (ref 135–145)

## 2012-05-23 LAB — URINALYSIS, ROUTINE W REFLEX MICROSCOPIC
Bilirubin Urine: NEGATIVE
Leukocytes, UA: NEGATIVE
Nitrite: NEGATIVE
Specific Gravity, Urine: 1.018 (ref 1.005–1.030)
pH: 5.5 (ref 5.0–8.0)

## 2012-05-23 LAB — CBC
HCT: 39 % (ref 36.0–46.0)
MCHC: 33.3 g/dL (ref 30.0–36.0)
Platelets: 289 10*3/uL (ref 150–400)
RDW: 13.2 % (ref 11.5–15.5)
WBC: 7.6 10*3/uL (ref 4.0–10.5)

## 2012-05-23 LAB — DIFFERENTIAL
Basophils Absolute: 0.1 10*3/uL (ref 0.0–0.1)
Basophils Relative: 1 % (ref 0–1)
Lymphocytes Relative: 29 % (ref 12–46)
Neutro Abs: 4.6 10*3/uL (ref 1.7–7.7)
Neutrophils Relative %: 61 % (ref 43–77)

## 2012-05-23 LAB — GLUCOSE, CAPILLARY

## 2012-05-23 NOTE — Telephone Encounter (Signed)
Pt informed of MD's advisement. 

## 2012-05-23 NOTE — Discharge Instructions (Signed)
Your pain is likely from the antibiotic you are taking. Stop taking the cephalexin. See your Dr. in one week for a checkup. Use Tylenol for pain.  Abdominal Pain Abdominal pain can be caused by many things. Your caregiver decides the seriousness of your pain by an examination and possibly blood tests and X-rays. Many cases can be observed and treated at home. Most abdominal pain is not caused by a disease and will probably improve without treatment. However, in many cases, more time must pass before a clear cause of the pain can be found. Before that point, it may not be known if you need more testing, or if hospitalization or surgery is needed. HOME CARE INSTRUCTIONS   Do not take laxatives unless directed by your caregiver.   Take pain medicine only as directed by your caregiver.   Only take over-the-counter or prescription medicines for pain, discomfort, or fever as directed by your caregiver.   Try a clear liquid diet (broth, tea, or water) for as long as directed by your caregiver. Slowly move to a bland diet as tolerated.  SEEK IMMEDIATE MEDICAL CARE IF:   The pain does not go away.   You have a fever.   You keep throwing up (vomiting).   The pain is felt only in portions of the abdomen. Pain in the right side could possibly be appendicitis. In an adult, pain in the left lower portion of the abdomen could be colitis or diverticulitis.   You pass bloody or black tarry stools.  MAKE SURE YOU:   Understand these instructions.   Will watch your condition.   Will get help right away if you are not doing well or get worse.  Document Released: 08/26/2005 Document Revised: 11/05/2011 Document Reviewed: 07/04/2008 [facility] Patient Information 2012 [place], [place].

## 2012-05-23 NOTE — ED Notes (Signed)
Per EMS pt in from home c/o RL abd pain. Recently treated for GI symptoms at [facility].

## 2012-05-23 NOTE — ED Provider Notes (Signed)
History     CSN: [id number]  Arrival date & time 05/23/12  1522   First MD Initiated Contact with Patient 05/23/12 1910      Chief Complaint  Patient presents with  . Abdominal Pain    (Consider location/radiation/quality/duration/timing/severity/associated sxs/prior treatment) HPI Comments: [name] is a 66 y.o. Female with vague abdominal pain for several days, while being treated for a urinary tract infection, medication, started 5 days ago. She has mild, associated nausea, but no vomiting. She is having daily soft bowel movements. She denies fever, or chills. There's been no cough, shortness of breath, or chest pain. She's been able to ambulate easily.  Patient is a 66 y.o. female presenting with abdominal pain. The history is provided by the patient.  Abdominal Pain The primary symptoms of the illness include abdominal pain.    Past Medical History  Diagnosis Date  . Personal history of colonic polyps   . Iron deficiency anemia, unspecified   . Dizziness   . Other malaise and fatigue   . Chest pain, unspecified   . Other B-complex deficiencies   . Arthritis     Right Sacroiliac joint  . Other and unspecified hyperlipidemia   . Anxiety and depression   . HTN (hypertension)   . Unspecified hypothyroidism   . Type II or unspecified type diabetes mellitus without mention of complication, not stated as uncontrolled   . Allergic rhinitis, cause unspecified   . Unspecified asthma   . Chronic airway obstruction, not elsewhere classified   . Abnormal stress test 08/21/2008    Neg  . GERD (gastroesophageal reflux disease)     Past Surgical History  Procedure Date  . Foot surgery     hammer toe 2004, 2005  . Abdominal hysterectomy 1982    menometorrhagia  . Carpal tunnel release 03/05/2009    bilateral    Family History  Problem Relation Age of Onset  . Hypertension Mother   . Diabetes Mother   . Heart disease Mother     CHF  . Coronary artery disease  Mother   . Lung cancer Father   . Hypertension Father   . Diabetes Father   . Liver cancer Father     w/mets  . Prostate cancer Father   . Breast cancer Sister   . Lung disease Brother     Agent Orange Related  . Asthma Brother   . Allergies Brother   . Colon cancer Neg Hx     History  Substance Use Topics  . Smoking status: Former Games developer  . Smokeless tobacco: Not on file   Comment: Quit 9 years ago.  . Alcohol Use: No    OB History    Grav Para Term Preterm Abortions TAB SAB Ect Mult Living                  Review of Systems  Gastrointestinal: Positive for abdominal pain.  All other systems reviewed and are negative.    Allergies  Aspirin; Codeine; Demerol; Latex; Lisinopril; and Meperidine hcl  Home Medications   Current Outpatient Rx  Name Route Sig Dispense Refill  . ASPIRIN-ACETAMINOPHEN-CAFFEINE 250-250-65 MG PO TABS Oral Take 1 tablet by mouth every 6 (six) hours as needed. Per bottle instructions.PAIN    . AZELASTINE HCL 137 MCG/SPRAY NA SOLN Nasal Place 1 spray into the nose at bedtime. Use in each nostril as directed 30 mL 5  . BUDESONIDE-FORMOTEROL FUMARATE 80-4.5 MCG/ACT IN AERO Inhalation Inhale 2 puffs into  the lungs 2 (two) times daily. 1 Inhaler 11  . CEPHALEXIN 500 MG PO CAPS Oral Take 1 capsule (500 mg total) by mouth 3 (three) times daily. 30 capsule 0  . CLONAZEPAM 1 MG PO TABS Oral Take 0.5 tablets (0.5 mg total) by mouth at bedtime as needed. Anxiety 30 tablet 5  . EZETIMIBE 10 MG PO TABS Oral Take 1 tablet (10 mg total) by mouth daily. 90 tablet 1  . FERROUS SULFATE 325 (65 FE) MG PO TBEC Oral Take 325 mg by mouth daily.     . FUROSEMIDE 40 MG PO TABS Oral Take 1 tablet (40 mg total) by mouth daily. For HTN. 30 tablet 5  . INSULIN GLARGINE 100 UNIT/ML [place] SOLN Subcutaneous Inject 40 Units into the skin daily. 20 mL 3  . LEVOTHYROXINE SODIUM 88 MCG PO TABS Oral Take 1 tablet (88 mcg total) by mouth daily. 90 tablet 3  . METFORMIN HCL 1000 MG PO  TABS Oral Take 1 tablet (1,000 mg total) by mouth 2 (two) times daily with a meal. 180 tablet 3  . ADULT MULTIVITAMIN W/MINERALS CH Oral Take 1 tablet by mouth daily.    Marland Kitchen ONDANSETRON HCL 4 MG PO TABS Oral Take 1 tablet (4 mg total) by mouth every 6 (six) hours. 12 tablet 0  . TIOTROPIUM BROMIDE MONOHYDRATE 18 MCG IN CAPS Inhalation Place 1 capsule (18 mcg total) into inhaler and inhale daily. 90 capsule 3  . VALSARTAN 160 MG PO TABS Oral Take 1 tablet (160 mg total) by mouth daily. 90 tablet 3  . VENLAFAXINE HCL 75 MG PO TABS Oral Take 1 tablet (75 mg total) by mouth 2 (two) times daily. 60 tablet 11  . ESOMEPRAZOLE MAGNESIUM 40 MG PO CPDR Oral Take 40 mg by mouth daily.     . SYRINGE/NEEDLE (DISP) 29G X 1/2" 1 ML MISC  Use as directed with lantus 200 each 0    BP 135/57  Pulse 76  Temp 97.8 F (36.6 C) (Oral)  Resp 20  SpO2 95%  Physical Exam  Nursing note and vitals reviewed. Constitutional: She is oriented to person, place, and time. She appears well-developed and well-nourished. No distress.       Obese  HENT:  Head: Normocephalic and atraumatic.  Eyes: Conjunctivae and EOM are normal. Pupils are equal, round, and reactive to light.  Neck: Normal range of motion and phonation normal. Neck supple.  Cardiovascular: Normal rate, regular rhythm and intact distal pulses.   Pulmonary/Chest: Effort normal and breath sounds normal. She exhibits no tenderness.  Abdominal: Soft. Bowel sounds are normal. She exhibits no distension and no mass. There is tenderness (Mild bilateral lower quadrant tenderness). There is no rebound and no guarding.  Musculoskeletal: Normal range of motion.  Neurological: She is alert and oriented to person, place, and time. She has normal strength. She exhibits normal muscle tone.  Skin: Skin is warm and dry.  Psychiatric: She has a normal mood and affect. Her behavior is normal. Judgment and thought content normal.    ED Course  Procedures (including critical  care time)  Labs Reviewed  BASIC METABOLIC PANEL - Abnormal; Notable for the following:    Chloride 95 (*)     Glucose, Bld 146 (*)     GFR calc non Af Amer 60 (*)     GFR calc Af Amer 70 (*)     All other components within normal limits  GLUCOSE, CAPILLARY - Abnormal; Notable for the following:  Glucose-Capillary 135 (*)     All other components within normal limits  CBC  DIFFERENTIAL  URINALYSIS, ROUTINE W REFLEX MICROSCOPIC  LAB REPORT - SCANNED   No results found.   1. Abdominal  pain, other specified site       MDM  Nonspecific abdominal pain, with reassuring. Evaluation in emergency department. Her urinalysis is normal today. Her cramping pain is most likely related to the antibiotic she has been taking. It is reasonable to stop the antibiotic at this time since she has a normal urinalysis. Doubt metabolic instability, serious bacterial infection or impending vascular collapse; the patient is stable for discharge.   Plan: Home Medications- stop Keflex; Home Treatments- rest, fluids; Recommended follow up- PCP in 1 week for repeat U/A        Flint Melter, MD 05/24/12 [phone]

## 2012-05-23 NOTE — ED Notes (Signed)
Pt c/o RLQ abd pain, sharp in nature x's 1 week. States was seen at [facility] and diagnosed with "stomach infection and urinary infection." Pt also states has been vomiting.

## 2012-05-27 ENCOUNTER — Ambulatory Visit (INDEPENDENT_AMBULATORY_CARE_PROVIDER_SITE_OTHER): Payer: Medicaid Other

## 2012-05-27 ENCOUNTER — Encounter: Payer: Self-pay | Admitting: Internal Medicine

## 2012-05-27 ENCOUNTER — Ambulatory Visit (INDEPENDENT_AMBULATORY_CARE_PROVIDER_SITE_OTHER): Payer: Medicare Other | Admitting: Internal Medicine

## 2012-05-27 VITALS — BP 116/66 | HR 72 | Ht 63.0 in | Wt 225.6 lb

## 2012-05-27 DIAGNOSIS — J301 Allergic rhinitis due to pollen: Secondary | ICD-10-CM

## 2012-05-27 DIAGNOSIS — J309 Allergic rhinitis, unspecified: Secondary | ICD-10-CM

## 2012-05-27 DIAGNOSIS — J449 Chronic obstructive pulmonary disease, unspecified: Secondary | ICD-10-CM

## 2012-05-27 DIAGNOSIS — J4489 Other specified chronic obstructive pulmonary disease: Secondary | ICD-10-CM

## 2012-05-27 DIAGNOSIS — L282 Other prurigo: Secondary | ICD-10-CM

## 2012-05-27 NOTE — Progress Notes (Signed)
Patient ID: [name], female    DOB: 12-22-1945, 66 y.o.   MRN: [id number]  HPI 05/19/11- 66 yoF former heavy smoker followed for asthma/ COPD, hx food intolerance, allergic rhinitis, complicated by obesity, DM Last here November 20, 2010- note reviewed Allergy vaccine 1:10 GH Says she has been doing well. Spring pollen bothered nose and chest in March. Had some infection then, helped by doxycycline.  Reports no change in dry hacking cough, not relieved by cough syrup. Occasional, infrequent heart burn. CXR 02/08/11- [place]- lungs clear, ASVD We discussed updating PFT. She had smoked up to 3 PPD  07/13/11- 66 yoF former heavy smoker followed for asthma/ COPD, hx food intolerance, allergic rhinitis, complicated by obesity, DM Did not schedule PFT after last visit as intended, but has done so now.  She feels well. Remains off cigarettes.  Coughs only after using her inhaler- only a dry cough. No longer experiences shortness of breath.  Allergy vaccine- she feels they help her a lot. Went to ENT for ear pain- blamed throat congestion on reflux and Rx'd bid Nexium- "helps a lot".   09/18/11-  66 yoF former heavy smoker followed for asthma/ COPD, hx food intolerance, allergic rhinitis, complicated by obesity, DM Has had flu vaccine. She had done fairly well since here in August. Last night she had onset of dry cough and head congestion without fever or sore throat. Intermittent pain in the right side of the neck at the angle of the jaw comes and goes. It is not associated with popping, vertigo, pain on chewing or altered hearing. History of smoking 3 packs per day. PFT 08/26/2011-FEV1 1.45/76%, FEV1/FVC 0.7 no, FEF 25-75% was 35% of predicted with slight response to bronchodilator. TLC 100% RV 131% DLCO 54%. Moderate obstruction with slight response to bronchodilator and air trapping. This is probably mostly some emphysema.  CXR 01/29/2011-atherosclerosis with clear lung fields.  02/12/12-   66 yoF former heavy smoker followed for asthma/ COPD, hx food intolerance, allergic rhinitis, complicated by obesity, DM Continues allergy vaccine without problems. She is sure it helps. She used her last doxycycline about 2 weeks ago for chest congestion. We discussed this. With some vertigo and pressure in right ear. Often has pain in right side of neck. Occasional minor cough. She is on lisinopril and we discussed this carefully. Using rescue inhaler twice a week. Last PFT was 08/26/2011. Charted.  05/27/12-   66 yoF former heavy smoker followed for asthma/ COPD, hx food intolerance, allergic rhinitis, complicated by obesity, DM    PCP Dr Debby Bud Still on Allergy Vaccine 1:10 here; states rash on body from thighs to neck-just itchy all the time; Denies any wheezing,cough,SOB, or congestion. Has had pruritic rash on trunk and arms for 3 weeks, cause unknown. Cortisone cream has not seemed to help. She had taken cephalexin for UTI last week the rash began before that. Breathing has been good with no recent exacerbation and insignificant cough and wheeze.  Review of Systems- see HPI Constitutional:   No-   weight loss, night sweats, fevers, chills, fatigue, lassitude. HEENT:   No-  headaches, difficulty swallowing, tooth/dental problems, sore throat,       No-  sneezing, itching, +ear ache, nasal congestion, post nasal drip,  CV:  No-   chest pain, orthopnea, PND, swelling in lower extremities, anasarca, dizziness, palpitations Resp: Some  shortness of breath with exertion or at rest.  No-   productive cough,  + non-productive cough,  No-  coughing up of blood.              No-   change in color of mucus.  No- wheezing.   Skin: HPI GI:  No-   heartburn, indigestion, abdominal pain, nausea, vomiting, GU: MS:  No-   joint pain or swelling.  Neuro- nothing unusual  Psych:  No- change in mood or affect. No depression or anxiety.  No memory loss.     Objective:   Physical Exam General-  Alert, Oriented, Affect-appropriate, Distress- none acute    obese Skin- dry skin, very faint slightly papular rash with some excoriation demonstrated on arms and low back Lymphadenopathy- none Head- atraumatic            Eyes- Gross vision intact, PERRLA, conjunctivae clear secretions            Ears- Hearing, canals- normal            Nose- Clear, No- Septal dev, mucus, polyps, erosion, perforation             Throat- Mallampati III , mucosa clear , drainage- none, tonsils- atrophic      dentures Neck- flexible , trachea midline, no stridor , thyroid nl, carotid no bruit, nontender Chest - symmetrical excursion , unlabored           Heart/CV- RRR , no murmur , no gallop  , no rub, nl s1 s2                           - JVD- none , edema- none, stasis changes- none, varices- none           Lung- clear , wheeze- none, cough- none , dullness-none, rub- none           Chest wall-  Abd- Br/ Gen/ Rectal- Not done, not indicated Extrem- cyanosis- none, clubbing, none, atrophy- none, strength- nl Neuro- resting tremor/ head bob

## 2012-05-27 NOTE — Patient Instructions (Addendum)
Suggest daily claritin/ loratadine antistamine for several days to see if that will ease your itching.   Suggest a skin lotion like Aveeno, Cetaphil or Eucerin  If you don't get better, ask Dr Debby Bud about seeing a dermatologist

## 2012-05-30 ENCOUNTER — Telehealth: Payer: Self-pay | Admitting: Gastroenterology

## 2012-05-30 NOTE — Telephone Encounter (Signed)
Pt states she is having problems with diarrhea and abdominal pain. Reports going to the ER but they did not help her. Pt scheduled to see Willette Cluster NP 06/01/12@10 :30am. Pt aware of appt date and time.

## 2012-05-31 ENCOUNTER — Telehealth: Payer: Self-pay

## 2012-05-31 DIAGNOSIS — L282 Other prurigo: Secondary | ICD-10-CM

## 2012-05-31 NOTE — Assessment & Plan Note (Signed)
Good control recently without cough or wheeze. No changes needed.

## 2012-05-31 NOTE — Assessment & Plan Note (Signed)
She continues allergy vaccine without problems. We discussed use of antihistamines.

## 2012-05-31 NOTE — Telephone Encounter (Signed)
Pt called requesting referral to Dermatology for persistent rash.

## 2012-05-31 NOTE — Assessment & Plan Note (Signed)
Very nonspecific appearance. Plan-try OTC antihistamine first.

## 2012-06-01 ENCOUNTER — Ambulatory Visit (INDEPENDENT_AMBULATORY_CARE_PROVIDER_SITE_OTHER): Payer: Medicare Other | Admitting: Nurse Practitioner

## 2012-06-01 ENCOUNTER — Other Ambulatory Visit (INDEPENDENT_AMBULATORY_CARE_PROVIDER_SITE_OTHER): Payer: Medicare Other

## 2012-06-01 ENCOUNTER — Encounter: Payer: Self-pay | Admitting: Nurse Practitioner

## 2012-06-01 VITALS — BP 138/68 | HR 64 | Ht 63.0 in | Wt 219.0 lb

## 2012-06-01 DIAGNOSIS — L299 Pruritus, unspecified: Secondary | ICD-10-CM

## 2012-06-01 DIAGNOSIS — R195 Other fecal abnormalities: Secondary | ICD-10-CM | POA: Insufficient documentation

## 2012-06-01 DIAGNOSIS — R109 Unspecified abdominal pain: Secondary | ICD-10-CM

## 2012-06-01 DIAGNOSIS — R1030 Lower abdominal pain, unspecified: Secondary | ICD-10-CM | POA: Insufficient documentation

## 2012-06-01 LAB — HEPATIC FUNCTION PANEL: Total Bilirubin: 0.6 mg/dL (ref 0.3–1.2)

## 2012-06-01 MED ORDER — DICYCLOMINE HCL 10 MG PO CAPS: ORAL_CAPSULE | ORAL | Status: DC

## 2012-06-01 NOTE — Progress Notes (Signed)
[name] [id number] February 11, 1946   HISTORY OR PRESENT ILLNESS :  [name] is a 66 year old female with multiple medical problems known to Dr. Arlyce Dice for history of iron deficiency anemia. Blood loss was presumably secondary to multiple polyps found on colonoscopy December 2011.   Ptient saw her PCP in mid May for diarrhea and lower abdominal pain. A CT scan of the abdomen and pelvis with contrast was obtained, no acute findings. PCP felt diarrhea was secondary to IBS. She later went to the emergency department 05/19/12 for evaluation of nausea.  CBC in emergency department was normal. Urinalysis showed small leukocytes, nitrite negative. Her electrolytes and renal function were normal. She was diagnosed with a UTI, given a dose of Rocephin and sent home with Keflex. Patient tells me she was also given an antibiotic for her stomach but I cannot find record of this.  Patient then went back to ED on 05/23/12 for lower abdominal pain and nausea. ED felt symptoms related to Keflex which was stopped. Though there was not mention of diarrhea in the emergency department notes, patient states she has been having diarrhea for about 2 months. Her diarrhea has actually improved over the last few days.  She continues to have diffuse lower abdominal pain.  Pain not related eating, it does not really improve defecation.  Current Medications, Allergies, Past Medical History, Past Surgical History, Family History and Social History were reviewed in [employer] record.   PHYSICAL EXAMINATION : General:  Obese white  female in no acute distress Head: Normocephalic and atraumatic Eyes:  sclerae anicteric,conjunctive pink. Ears: Normal auditory acuity Neck: Supple, no masses.  Lungs: Clear throughout to auscultation Heart: Regular rate and rhythm Abdomen: Soft, obese, mild diffuse lower abdominal tenderness. No masses or hepatomegaly noted. Normal bowel sounds Rectal: not  done Musculoskeletal: Symmetrical with no gross deformities  Skin: No lesions on visible extremities Extremities: No edema or deformities noted Neurological: Oriented x 4, grossly nonfocal Cervical Nodes:  No significant cervical adenopathy Psychological:  Alert and cooperative. Normal mood and affect  ASSESSMENT AND PLAN :  1. Six week history of diarrhea and lower abdominal pain. Negative CTscan late May for same symptoms. Her diarrhea has somewhat improved over the last few days but still having pain. Etiology? Will check stool studies. Her labs in ED look okay. Trial of Bentyl. Follow up with Dr. Arlyce Dice. In meantime will call her with test results.   2. Pruritis, diffuse upper body. Check LFTs. No obvious jaundice on exam.   3. history of adenomatous colon polyps. She is up to date on surveillance colonoscopy, next one due December 2014.  4. multiple medical problems, refer to past medical history.

## 2012-06-01 NOTE — Patient Instructions (Addendum)
Please go to the basement level to have your labs drawn.  We sent a prescription to [facility] [address]  Make a follow up appointment with Dr. Marzetta Board first available.

## 2012-06-01 NOTE — Telephone Encounter (Signed)
P[facility] notified.  

## 2012-06-03 NOTE — Progress Notes (Signed)
i agree with the plan outlined in this note 

## 2012-06-07 ENCOUNTER — Encounter: Payer: Self-pay | Admitting: Nurse Practitioner

## 2012-06-07 ENCOUNTER — Emergency Department (HOSPITAL_COMMUNITY)
Admission: EM | Admit: 2012-06-07 | Discharge: 2012-06-07 | Disposition: A | Discharge status: Home / Self Care | Payer: Medicare HMO | Attending: Emergency Medicine | Admitting: Emergency Medicine

## 2012-06-07 DIAGNOSIS — E119 Type 2 diabetes mellitus without complications: Secondary | ICD-10-CM | POA: Insufficient documentation

## 2012-06-07 DIAGNOSIS — Z87891 Personal history of nicotine dependence: Secondary | ICD-10-CM | POA: Insufficient documentation

## 2012-06-07 DIAGNOSIS — F329 Major depressive disorder, single episode, unspecified: Secondary | ICD-10-CM | POA: Insufficient documentation

## 2012-06-07 DIAGNOSIS — F3289 Other specified depressive episodes: Secondary | ICD-10-CM | POA: Insufficient documentation

## 2012-06-07 DIAGNOSIS — M1990 Unspecified osteoarthritis, unspecified site: Secondary | ICD-10-CM | POA: Insufficient documentation

## 2012-06-07 DIAGNOSIS — B86 Scabies: Secondary | ICD-10-CM

## 2012-06-07 DIAGNOSIS — F411 Generalized anxiety disorder: Secondary | ICD-10-CM | POA: Insufficient documentation

## 2012-06-07 DIAGNOSIS — Z803 Family history of malignant neoplasm of breast: Secondary | ICD-10-CM | POA: Insufficient documentation

## 2012-06-07 DIAGNOSIS — Z800 Family history of malignant neoplasm of digestive organs: Secondary | ICD-10-CM | POA: Insufficient documentation

## 2012-06-07 DIAGNOSIS — Z860100 Personal history of colon polyps, unspecified: Secondary | ICD-10-CM | POA: Insufficient documentation

## 2012-06-07 DIAGNOSIS — Z794 Long term (current) use of insulin: Secondary | ICD-10-CM | POA: Insufficient documentation

## 2012-06-07 DIAGNOSIS — Z801 Family history of malignant neoplasm of trachea, bronchus and lung: Secondary | ICD-10-CM | POA: Insufficient documentation

## 2012-06-07 DIAGNOSIS — I10 Essential (primary) hypertension: Secondary | ICD-10-CM | POA: Insufficient documentation

## 2012-06-07 DIAGNOSIS — Z8042 Family history of malignant neoplasm of prostate: Secondary | ICD-10-CM | POA: Insufficient documentation

## 2012-06-07 DIAGNOSIS — R079 Chest pain, unspecified: Secondary | ICD-10-CM | POA: Insufficient documentation

## 2012-06-07 DIAGNOSIS — K219 Gastro-esophageal reflux disease without esophagitis: Secondary | ICD-10-CM | POA: Insufficient documentation

## 2012-06-07 DIAGNOSIS — Z833 Family history of diabetes mellitus: Secondary | ICD-10-CM | POA: Insufficient documentation

## 2012-06-07 DIAGNOSIS — E785 Hyperlipidemia, unspecified: Secondary | ICD-10-CM | POA: Insufficient documentation

## 2012-06-07 DIAGNOSIS — Z8249 Family history of ischemic heart disease and other diseases of the circulatory system: Secondary | ICD-10-CM | POA: Insufficient documentation

## 2012-06-07 DIAGNOSIS — D509 Iron deficiency anemia, unspecified: Secondary | ICD-10-CM | POA: Insufficient documentation

## 2012-06-07 DIAGNOSIS — Z888 Allergy status to other drugs, medicaments and biological substances status: Secondary | ICD-10-CM | POA: Insufficient documentation

## 2012-06-07 DIAGNOSIS — J45909 Unspecified asthma, uncomplicated: Secondary | ICD-10-CM | POA: Insufficient documentation

## 2012-06-07 DIAGNOSIS — Z825 Family history of asthma and other chronic lower respiratory diseases: Secondary | ICD-10-CM | POA: Insufficient documentation

## 2012-06-07 DIAGNOSIS — Z885 Allergy status to narcotic agent status: Secondary | ICD-10-CM | POA: Insufficient documentation

## 2012-06-07 MED ORDER — PERMETHRIN 1 % EX LOTN: TOPICAL_LOTION | Freq: Once | CUTANEOUS | Status: DC

## 2012-06-07 NOTE — Telephone Encounter (Signed)
Error

## 2012-06-07 NOTE — ED Notes (Signed)
Months. Of untreated scabies (~ 5 mos.). Itching over arms, upper chest, and legs.

## 2012-06-07 NOTE — ED Provider Notes (Signed)
History     CSN: [id number]  Arrival date & time 06/07/12  1653   First MD Initiated Contact with Patient 06/07/12 1711      Chief Complaint  Patient presents with  . Pruritis    (Consider location/radiation/quality/duration/timing/severity/associated sxs/prior treatment) HPI Comments: Patient presents emergency department with chief complaint of purpuric rash starting 5 days ago.  Patient has 5 family members including nieces, nephews, grandchildren and son with current scabies infection.  Patient states that her rash started in her finger webs bilaterally and has gradually moved to her lower abdomen under her pannus and in her arm folds.  Pruritus is worse at night.  Patient denies fevers, night sweats, chills.  The history is provided by the patient.    Past Medical History  Diagnosis Date  . Personal history of colonic polyps   . Iron deficiency anemia, unspecified   . Other malaise and fatigue   . Chest pain, unspecified   . Other B-complex deficiencies   . Arthritis     Right Sacroiliac joint  . Other and unspecified hyperlipidemia   . Anxiety and depression   . HTN (hypertension)   . Unspecified hypothyroidism   . Type II or unspecified type diabetes mellitus without mention of complication, not stated as uncontrolled   . Allergic rhinitis, cause unspecified   . Unspecified asthma   . Chronic airway obstruction, not elsewhere classified   . Abnormal stress test 08/21/2008    Neg  . GERD (gastroesophageal reflux disease)     Past Surgical History  Procedure Date  . Foot surgery     hammer toe 2004, 2005  . Abdominal hysterectomy 1982    menometorrhagia  . Carpal tunnel release 03/05/2009    bilateral    Family History  Problem Relation Age of Onset  . Hypertension Mother   . Diabetes Mother   . Heart disease Mother     CHF  . Coronary artery disease Mother   . Lung cancer Father   . Hypertension Father   . Diabetes Father   . Liver cancer Father    w/mets  . Prostate cancer Father   . Breast cancer Sister   . Lung disease Brother     Agent Orange Related  . Asthma Brother   . Allergies Brother   . Colon cancer Neg Hx     History  Substance Use Topics  . Smoking status: Former Smoker    Quit date: 06/01/1997  . Smokeless tobacco: Never Used   Comment: Quit 9 years ago.  . Alcohol Use: No    OB History    Grav Para Term Preterm Abortions TAB SAB Ect Mult Living                  Review of Systems  Constitutional: Negative for fever, chills and appetite change.  HENT: Negative for congestion.   Eyes: Negative for visual disturbance.  Respiratory: Negative for shortness of breath.   Cardiovascular: Negative for chest pain and leg swelling.  Gastrointestinal: Negative for abdominal pain.  Genitourinary: Negative for dysuria, urgency and frequency.  Skin: Positive for rash.  Neurological: Negative for dizziness, syncope, weakness, light-headedness, numbness and headaches.  Psychiatric/Behavioral: Negative for confusion.    Allergies  Aspirin; Codeine; Demerol; Latex; Lisinopril; and Meperidine hcl  Home Medications   Current Outpatient Rx  Name Route Sig Dispense Refill  . ASPIRIN-ACETAMINOPHEN-CAFFEINE 250-250-65 MG PO TABS Oral Take 1 tablet by mouth every 6 (six) hours as needed. Per bottle  instructions.PAIN    . AZELASTINE HCL 137 MCG/SPRAY NA SOLN Nasal Place 1 spray into the nose at bedtime. Use in each nostril as directed 30 mL 5  . BUDESONIDE-FORMOTEROL FUMARATE 80-4.5 MCG/ACT IN AERO Inhalation Inhale 2 puffs into the lungs 2 (two) times daily. 1 Inhaler 11  . CLONAZEPAM 1 MG PO TABS Oral Take 0.5 tablets (0.5 mg total) by mouth at bedtime as needed. Anxiety 30 tablet 5  . DICYCLOMINE HCL 10 MG PO CAPS  Take 1 tab twice daily for cramping and spasms. 60 capsule 1  . ESOMEPRAZOLE MAGNESIUM 40 MG PO CPDR Oral Take 40 mg by mouth daily.     Marland Kitchen EZETIMIBE 10 MG PO TABS Oral Take 1 tablet (10 mg total) by mouth  daily. 90 tablet 1  . FERROUS SULFATE 325 (65 FE) MG PO TBEC Oral Take 325 mg by mouth daily.     . FUROSEMIDE 40 MG PO TABS Oral Take 1 tablet (40 mg total) by mouth daily. For HTN. 30 tablet 5  . INSULIN GLARGINE 100 UNIT/ML [place] SOLN Subcutaneous Inject 40 Units into the skin daily. 20 mL 3  . LEVOTHYROXINE SODIUM 88 MCG PO TABS Oral Take 1 tablet (88 mcg total) by mouth daily. 90 tablet 3  . METFORMIN HCL 1000 MG PO TABS Oral Take 1 tablet (1,000 mg total) by mouth 2 (two) times daily with a meal. 180 tablet 3  . ADULT MULTIVITAMIN W/MINERALS CH Oral Take 1 tablet by mouth daily.    . SYRINGE/NEEDLE (DISP) 29G X 1/2" 1 ML MISC  Use as directed with lantus 200 each 0  . TIOTROPIUM BROMIDE MONOHYDRATE 18 MCG IN CAPS Inhalation Place 1 capsule (18 mcg total) into inhaler and inhale daily. 90 capsule 3  . VALSARTAN 160 MG PO TABS Oral Take 1 tablet (160 mg total) by mouth daily. 90 tablet 3  . VENLAFAXINE HCL 75 MG PO TABS Oral Take 1 tablet (75 mg total) by mouth 2 (two) times daily. 60 tablet 11    BP 149/61  Pulse 96  Temp 98 F (36.7 C) (Oral)  Resp 22  SpO2 97%  Physical Exam  Nursing note and vitals reviewed. Constitutional: She is oriented to person, place, and time. She appears well-developed and well-nourished. No distress.  HENT:  Head: Normocephalic and atraumatic.  Eyes: Conjunctivae and EOM are normal.  Neck: Normal range of motion.  Pulmonary/Chest: Effort normal.  Musculoskeletal: Normal range of motion.  Neurological: She is alert and oriented to person, place, and time.  Skin: Skin is warm and dry. Rash noted. She is not diaphoretic.       Erythematous papules and burrows of linear curved slightly elevated P. dots and excoriation marks noted on finger webs, flexor surface, groin area and under abdominal pannus.  No weeping, erythema, fluctuance or draining.  Psychiatric: She has a normal mood and affect. Her behavior is normal.    ED Course  Procedures (including  critical care time)  Labs Reviewed - No data to display No results found.   No diagnosis found.    MDM  Scabies  Discussed diagnosis & treatment of scabies with parents.  They have been advised to followup with her primary care doctor 2 weeks after treatment.  They have also been advised to clean entire household including washing sheets and using R.I.D. spray in the car and on sofa.   The use of permethrin cream was discussed  They've been advised to repeat treatment if new eruptions  occur. Patient's parents verbalized understanding.          Jaci Carrel, [place] 06/07/12 [phone]

## 2012-06-08 ENCOUNTER — Telehealth: Payer: Self-pay | Admitting: Internal Medicine

## 2012-06-08 NOTE — Telephone Encounter (Signed)
Add to schedule for Thursday 

## 2012-06-08 NOTE — Telephone Encounter (Signed)
EMERGENT Caller: Jennifer/Child; PCP: Illene Regulus; CB#: [phone];  Call regarding Blood Sugar Is 243, Wondering How Much More Medication To Give Pt;  Took 40 units [place] this morning.  Fell at home stating tripped over cat; no cat was found.  EMS evaluated, no injury found.  Reported blood sugar was 243 at 1440.  Blood sugar 253 on recheck at 1511.  Reports daily falls.  Very thirsty, episodes of lightheadness.  Progressive dementia noticable. Unable to recall previous blood sugars. Hallucinations at times, unable to administer own meds. Family is handling meds.  Advised to see MD now for taking pills and blood sugar presumed to be  250 or higher for more than 24 hrs and history of increasing falls per Diabetes Control Problem.  No appts remain; info noted and sent to [facility] for call back.

## 2012-06-08 NOTE — Telephone Encounter (Signed)
Caller: [name]/daughter-in-law PCP: Illene Regulus; CB#: [phone]; Calling this evening 06/08/12 regarding FSBS 439 at 6:40 PM, this was after she had eaten.  Pt takes 40 Units of Lantus in AM.  She did fall today and EMS came out and they checked her sugar and said was 253 and she took 20 extra of Lantus at 2:30 PM.  Emergent symptoms r/o by Diabetes Control Problems guidelines with exception of blood sugar 250 mg/dl or higher even after taking extra insulin. Triager contacted Dr. Filbert Berthold and he advised to have pt drink plenty of water, no food.  Recheck sugar in 2 hours and if still over 350, then take pt to ED.  Daughter-in-law verbalized understanding.  Triager reviewed note for call in [facility] today and Dr. Debby Bud advised he would like to have pt worked in for appt for tomorrow, so triager scheduled appt for tomorrow 06/09/12 at 4:15 PM with Dr. Debby Bud, per MD note in [facility].

## 2012-06-08 NOTE — ED Provider Notes (Signed)
Medical screening examination/treatment/procedure(s) were performed by non-physician practitioner and as supervising physician I was immediately available for consultation/collaboration.    Kervens Roper R Jarell Mcewen, MD 06/08/12 0051 

## 2012-06-09 ENCOUNTER — Ambulatory Visit (INDEPENDENT_AMBULATORY_CARE_PROVIDER_SITE_OTHER): Payer: Medicare HMO | Admitting: Internal Medicine

## 2012-06-09 ENCOUNTER — Encounter: Payer: Self-pay | Admitting: Internal Medicine

## 2012-06-09 VITALS — BP 120/88 | HR 87 | Temp 97.5°F | Resp 16 | Wt 218.0 lb

## 2012-06-09 DIAGNOSIS — Z9181 History of falling: Secondary | ICD-10-CM

## 2012-06-09 DIAGNOSIS — R443 Hallucinations, unspecified: Secondary | ICD-10-CM

## 2012-06-09 DIAGNOSIS — I10 Essential (primary) hypertension: Secondary | ICD-10-CM

## 2012-06-09 DIAGNOSIS — E119 Type 2 diabetes mellitus without complications: Secondary | ICD-10-CM

## 2012-06-09 DIAGNOSIS — R296 Repeated falls: Secondary | ICD-10-CM

## 2012-06-09 DIAGNOSIS — F341 Dysthymic disorder: Secondary | ICD-10-CM

## 2012-06-09 DIAGNOSIS — R413 Other amnesia: Secondary | ICD-10-CM | POA: Insufficient documentation

## 2012-06-09 MED ORDER — VALSARTAN 160 MG PO TABS: 160.0000 mg | ORAL_TABLET | Freq: Every day | ORAL | Status: DC

## 2012-06-09 MED ORDER — PERMETHRIN 5 % EX CREA: TOPICAL_CREAM | Freq: Once | CUTANEOUS | Status: DC

## 2012-06-09 MED ORDER — SITAGLIPTIN PHOSPHATE 100 MG PO TABS: 100.0000 mg | ORAL_TABLET | Freq: Every day | ORAL | Status: DC

## 2012-06-09 MED ORDER — ASPIRIN-ACETAMINOPHEN-CAFFEINE 250-250-65 MG PO TABS: 1.0000 | ORAL_TABLET | Freq: Four times a day (QID) | ORAL | Status: DC | PRN

## 2012-06-09 MED ORDER — VENLAFAXINE HCL 75 MG PO TABS: 75.0000 mg | ORAL_TABLET | Freq: Two times a day (BID) | ORAL | Status: DC

## 2012-06-09 NOTE — Patient Instructions (Addendum)
1. Scabies - have sdent in the permetherin 5% cream to apply to rash in a thin coat. May repeat at 1 week if needed.  2. Diabetes - A1C has been in the 8% range for the last year! CBGs in the 200-300 range.   you must do your part: NO SUGAR AT ALL, very limited carbohydrates - bread, rice, potatoes, etc.             You must walk every day   Take the lantus 40 units at Bedtime; take the metformin 1,000 mg twice a day; take januvia 100 mg once a day.   Check your blood sugar in the AM before you eat and keep a record. Send the information to me every 5 days until the lantus dose is adjusted.    Watch out for low blood sugar- feeling cold or clammy, weak.   Eat meals on a regular schedule - 3 meals a day and a small bedtime.   3. Diarrhea - resolved.

## 2012-06-09 NOTE — Progress Notes (Signed)
Subjective:    Patient ID: [name], female    DOB: 12-22-1945, 66 y.o.   MRN: [id number]  HPI Mrs. Valadez was last seen June 17th for MS changes. She was seen June 21 ED for abdominal pain, given fluids, UTI treated with keflex; June 24th ED visit for abdominal pain thought to be due to meds; June 28th - Dr. Maple Hudson for pulmonary follow-up; July 3rd GI for diarrhea - normal LFTs, no stool studies ever done; July 9th ED - dx'd scabies - did not respond yo NIX shampoo used as body lotion; July 11th - for elevated CBGs.   She reports she has been taking her medications but taking CBGs at odd hours and the readings have been in the 200's. . She has not been particularly symptomatic. Reviewed her chart - chronically elevated A1C in the 8% range.  Family is very concerned that she is suffering rapidly progressive mental status change: hallucinosis, very forgettfull and cannot manage her own medications. Family wishes a neuro consult.  She is very concerned for lung cancer. She has been quit for 30 + years. CT chest in 2005 was unremarkable except for chronic bronchitic changes. She should continue to use inhaler.   Past Medical History  Diagnosis Date  . Personal history of colonic polyps   . Iron deficiency anemia, unspecified   . Other malaise and fatigue   . Chest pain, unspecified   . Other B-complex deficiencies   . Arthritis     Right Sacroiliac joint  . Other and unspecified hyperlipidemia   . Anxiety and depression   . HTN (hypertension)   . Unspecified hypothyroidism   . Type II or unspecified type diabetes mellitus without mention of complication, not stated as uncontrolled   . Allergic rhinitis, cause unspecified   . Unspecified asthma   . Chronic airway obstruction, not elsewhere classified   . Abnormal stress test 08/21/2008    Neg  . GERD (gastroesophageal reflux disease)    Past Surgical History  Procedure Date  . Foot surgery     hammer toe 2004, 2005  .  Abdominal hysterectomy 1982    menometorrhagia  . Carpal tunnel release 03/05/2009    bilateral   Family History  Problem Relation Age of Onset  . Hypertension Mother   . Diabetes Mother   . Heart disease Mother     CHF  . Coronary artery disease Mother   . Lung cancer Father   . Hypertension Father   . Diabetes Father   . Liver cancer Father     w/mets  . Prostate cancer Father   . Breast cancer Sister   . Lung disease Brother     Agent Orange Related  . Asthma Brother   . Allergies Brother   . Colon cancer Neg Hx    History   Social History  . Marital Status: Legally Separated    Spouse Name: N/A    Number of Children: 6  . Years of Education: N/A   Occupational History  .     Social History Main Topics  . Smoking status: Former Smoker    Quit date: 06/01/1997  . Smokeless tobacco: Never Used   Comment: Quit 9 years ago.  . Alcohol Use: No  . Drug Use: No  . Sexually Active: Not on file   Other Topics Concern  . Not on file   Social History Narrative   10th gradeMarried - 1965, Divorced after 5 years; married 1971-divorced 2 yrs;  married [id number] sons - '68, '71, '79; 3 daughters - '69, '71, '79Grandchildren 10; 2 great-grandchildrenDisability - was a CNA, disability ended with Medicare/retirement. Looking for work but can't find a jobEnvironment: House with crawl space, central air conditioning, hard wood. No feather bedding, no mold. Son smokes. Pets including dogs, cats, 3 birds. Angioedema with Aspirin.Daily caffeine use two cups a dayRegular Exercise-yes    Current Outpatient Prescriptions on File Prior to Visit  Medication Sig Dispense Refill  . azelastine (ASTELIN) 137 MCG/SPRAY nasal spray Place 1 spray into the nose at bedtime. Use in each nostril as directed  30 mL  5  . budesonide-formoterol (SYMBICORT) 80-4.5 MCG/ACT inhaler Inhale 2 puffs into the lungs 2 (two) times daily.  1 Inhaler  11  . clonazePAM (KLONOPIN) 1 MG tablet Take 0.5 tablets (0.5 mg  total) by mouth at bedtime as needed. Anxiety  30 tablet  5  . dicyclomine (BENTYL) 10 MG capsule Take 1 tab twice daily for cramping and spasms.  60 capsule  1  . esomeprazole (NEXIUM) 40 MG capsule Take 40 mg by mouth daily.       Marland Kitchen ezetimibe (ZETIA) 10 MG tablet Take 1 tablet (10 mg total) by mouth daily.  90 tablet  1  . ferrous sulfate 325 (65 FE) MG EC tablet Take 325 mg by mouth daily.       . furosemide (LASIX) 40 MG tablet Take 1 tablet (40 mg total) by mouth daily. For HTN.  30 tablet  5  . insulin glargine (LANTUS) 100 UNIT/ML injection Inject 40 Units into the skin daily.  20 mL  3  . levothyroxine (SYNTHROID) 88 MCG tablet Take 1 tablet (88 mcg total) by mouth daily.  90 tablet  3  . metFORMIN (GLUCOPHAGE) 1000 MG tablet Take 1 tablet (1,000 mg total) by mouth 2 (two) times daily with a meal.  180 tablet  3  . Multiple Vitamin (MULTIVITAMIN WITH MINERALS) TABS Take 1 tablet by mouth daily.      . sitaGLIPtin (JANUVIA) 100 MG tablet Take 1 tablet (100 mg total) by mouth daily.  30 tablet  11  . Syringe/Needle, Disp, 29G X 1/2" 1 ML MISC Use as directed with lantus  200 each  0  . tiotropium (SPIRIVA HANDIHALER) 18 MCG inhalation capsule Place 1 capsule (18 mcg total) into inhaler and inhale daily.  90 capsule  3  . valsartan (DIOVAN) 160 MG tablet Take 1 tablet (160 mg total) by mouth daily.  90 tablet  3  . venlafaxine (EFFEXOR) 75 MG tablet Take 1 tablet (75 mg total) by mouth 2 (two) times daily.  60 tablet  11  . DISCONTD: Calcium Carbonate-Vitamin D (CALCIUM 600/VITAMIN D) 600-400 MG-UNIT per tablet Take 1 tablet by mouth 2 (two) times daily.        Marland Kitchen DISCONTD: sodium chloride (OCEAN) 0.65 % nasal spray Place 2 sprays into the nose as needed. TO OPEN NASAL BREATHING          Review of Systems System review is negative for any constitutional, cardiac, pulmonary, GI or neuro symptoms or complaints other than as described in the HPI.     Objective:   Physical Exam Filed  Vitals:   06/09/12 1644  BP: 120/88  Pulse: 87  Temp: 97.5 F (36.4 C)  Resp: 16   Wt Readings from Last 3 Encounters:  06/09/12 218 lb (98.884 kg)  06/01/12 219 lb (99.338 kg)  05/27/12 225 lb 9.6 oz (102.331 kg)  Gen'l- obese white woman in no distress HEENT - C&S clear, PERRLA/EOMI Neck- supple Cor- 2+ radial pulse, RRR Pulm - normal respirations. Abd - obese Neuro - A&O to person place and context. Her speech is clear. Her recall of ED visits is pretty good. Formal MMSE not done. She did have MMSE June 17th - poor performance       Assessment & Plan:  1. Scabies - have sdent in the permetherin 5% cream to apply to rash in a thin coat. May repeat at 1 week if needed.

## 2012-06-12 NOTE — Assessment & Plan Note (Signed)
Family concerned for more rapid progression of memory loss.  Plan  Refer to Dr. Lesia Sago at the family's request.

## 2012-06-12 NOTE — Assessment & Plan Note (Signed)
Lab Results  Component Value Date   HGBA1C 8.0* 04/14/2012      Diabetes - A1C has been in the 8% range for the last year! CBGs in the 200-300 range.   you must do your part: NO SUGAR AT ALL, very limited carbohydrates - bread, rice, potatoes, etc.             You must walk every day   Take the lantus 40 units at Bedtime; take the metformin 1,000 mg twice a day; take januvia 100 mg once a day.   Check your blood sugar in the AM before you eat and keep a record. Send the information to me every 5 days until the lantus dose is adjusted.    Watch out for low blood sugar- feeling cold or clammy, weak.   Eat meals on a regular schedule - 3 meals a day and a small bedtime.

## 2012-06-12 NOTE — Assessment & Plan Note (Signed)
BP Readings from Last 3 Encounters:  06/09/12 120/88  06/07/12 149/61  06/01/12 138/68   OK control.

## 2012-06-14 ENCOUNTER — Other Ambulatory Visit: Payer: Self-pay

## 2012-06-14 ENCOUNTER — Telehealth: Payer: Self-pay

## 2012-06-14 NOTE — Telephone Encounter (Signed)
No on regular med list. Need to know the reason she needs refill on controlled pain med

## 2012-06-14 NOTE — Telephone Encounter (Signed)
Please advise if ok to refill medication for this patient, last filled 09/01/11 #[id number]/[id number]

## 2012-06-14 NOTE — Telephone Encounter (Signed)
A user error has taken place: encounter opened in error, closed for administrative reasons.

## 2012-06-15 ENCOUNTER — Ambulatory Visit: Payer: Medicare Other | Admitting: Internal Medicine

## 2012-06-15 ENCOUNTER — Telehealth: Payer: Self-pay | Admitting: Internal Medicine

## 2012-06-15 NOTE — Telephone Encounter (Signed)
[name] advised that MD declined refill. Daughter also advised pt had appt scheduled with [facility] today that she did not shop for. Daughter says she was unaware of appt and denial but will call back to reschedule pt.

## 2012-06-15 NOTE — Telephone Encounter (Signed)
[name]/Daughter-in-law calling because the pharmacy faxed a request to the office for a refill of Hydrocodone for [name]. Pharmacy has not received response.  Please f/u with pt at [phone].

## 2012-06-16 ENCOUNTER — Encounter: Payer: Self-pay | Admitting: Internal Medicine

## 2012-06-17 ENCOUNTER — Other Ambulatory Visit: Payer: Self-pay | Admitting: *Deleted

## 2012-06-17 MED ORDER — GLUCOSE BLOOD VI STRP: ORAL_STRIP | Status: DC

## 2012-06-20 NOTE — Telephone Encounter (Signed)
HOME # OF PATIENT DISCONNECTED. LEFT MESSAGE ON CELL # OF NEED TO CALL WITH REASON WHY  REQUESTING  RX OF HYDROCODONE/APAP. THIS IS NOT LISTED ON PATIENT MED LIST .

## 2012-06-21 ENCOUNTER — Telehealth: Payer: Self-pay

## 2012-06-21 ENCOUNTER — Other Ambulatory Visit: Payer: Self-pay | Admitting: Internal Medicine

## 2012-06-21 ENCOUNTER — Other Ambulatory Visit: Payer: Self-pay | Admitting: *Deleted

## 2012-06-21 ENCOUNTER — Emergency Department (HOSPITAL_COMMUNITY)
Admission: EM | Admit: 2012-06-21 | Discharge: 2012-06-21 | Disposition: A | Discharge status: Home / Self Care | Payer: Medicare HMO | Attending: Emergency Medicine | Admitting: Emergency Medicine

## 2012-06-21 ENCOUNTER — Encounter (HOSPITAL_COMMUNITY): Payer: Self-pay | Admitting: *Deleted

## 2012-06-21 DIAGNOSIS — E039 Hypothyroidism, unspecified: Secondary | ICD-10-CM | POA: Insufficient documentation

## 2012-06-21 DIAGNOSIS — Z79899 Other long term (current) drug therapy: Secondary | ICD-10-CM | POA: Insufficient documentation

## 2012-06-21 DIAGNOSIS — I10 Essential (primary) hypertension: Secondary | ICD-10-CM | POA: Insufficient documentation

## 2012-06-21 DIAGNOSIS — J449 Chronic obstructive pulmonary disease, unspecified: Secondary | ICD-10-CM | POA: Insufficient documentation

## 2012-06-21 DIAGNOSIS — Z794 Long term (current) use of insulin: Secondary | ICD-10-CM | POA: Insufficient documentation

## 2012-06-21 DIAGNOSIS — M129 Arthropathy, unspecified: Secondary | ICD-10-CM | POA: Insufficient documentation

## 2012-06-21 DIAGNOSIS — R42 Dizziness and giddiness: Secondary | ICD-10-CM | POA: Insufficient documentation

## 2012-06-21 DIAGNOSIS — D509 Iron deficiency anemia, unspecified: Secondary | ICD-10-CM | POA: Insufficient documentation

## 2012-06-21 DIAGNOSIS — E119 Type 2 diabetes mellitus without complications: Secondary | ICD-10-CM | POA: Insufficient documentation

## 2012-06-21 DIAGNOSIS — Z87891 Personal history of nicotine dependence: Secondary | ICD-10-CM | POA: Insufficient documentation

## 2012-06-21 DIAGNOSIS — K219 Gastro-esophageal reflux disease without esophagitis: Secondary | ICD-10-CM | POA: Insufficient documentation

## 2012-06-21 DIAGNOSIS — J4489 Other specified chronic obstructive pulmonary disease: Secondary | ICD-10-CM | POA: Insufficient documentation

## 2012-06-21 LAB — URINALYSIS, ROUTINE W REFLEX MICROSCOPIC
Glucose, UA: NEGATIVE mg/dL
pH: 5.5 (ref 5.0–8.0)

## 2012-06-21 LAB — URINE MICROSCOPIC-ADD ON

## 2012-06-21 MED ORDER — MECLIZINE HCL 12.5 MG PO TABS: 12.5000 mg | ORAL_TABLET | Freq: Three times a day (TID) | ORAL | Status: AC | PRN

## 2012-06-21 MED ORDER — GLUCOSE BLOOD VI STRP: ORAL_STRIP | Status: DC

## 2012-06-21 NOTE — Telephone Encounter (Signed)
Female caller states that she has read that Effexor can cause hallucinations and pt has been experiencing an increase in hallucinations. She is concerned that it may be caused by this medication and is requesting advisement from MD.

## 2012-06-21 NOTE — ED Provider Notes (Signed)
History     CSN: [id number]  Arrival date & time 06/21/12  1259   First MD Initiated Contact with Patient 06/21/12 1304      Chief Complaint  Patient presents with  . Headache  . Dizziness    (Consider location/radiation/quality/duration/timing/severity/associated sxs/prior treatment) Patient is a 66 y.o. female presenting with neurologic complaint. The history is provided by the patient, a relative and medical records.  Neurologic Problem The primary symptoms include dizziness. Primary symptoms do not include loss of consciousness, altered mental status, visual change, fever, nausea or vomiting. The symptoms began less than 1 hour ago. The episode lasted 5 minutes. The symptoms are resolved.  She describes the dizziness as a sensation of spinning. Onset quality: months ago. The dizziness has been unchanged since its onset. The dizziness is associated with rotation. Dizziness also occurs with tinnitus (occasional, intermittent, R ear). Dizziness does not occur with nausea, vomiting or weakness.  Additional symptoms include tinnitus (occasional, intermittent, R ear) and vertigo. Additional symptoms do not include neck stiffness, weakness, loss of balance or taste disturbance. Medical issues also include diabetes. Medical issues do not include recent surgery.    Past Medical History  Diagnosis Date  . Personal history of colonic polyps   . Iron deficiency anemia, unspecified   . Other malaise and fatigue   . Chest pain, unspecified   . Other B-complex deficiencies   . Arthritis     Right Sacroiliac joint  . Other and unspecified hyperlipidemia   . Anxiety and depression   . HTN (hypertension)   . Unspecified hypothyroidism   . Type II or unspecified type diabetes mellitus without mention of complication, not stated as uncontrolled   . Allergic rhinitis, cause unspecified   . Unspecified asthma   . Chronic airway obstruction, not elsewhere classified   . Abnormal stress test  08/21/2008    Neg  . GERD (gastroesophageal reflux disease)     Past Surgical History  Procedure Date  . Foot surgery     hammer toe 2004, 2005  . Abdominal hysterectomy 1982    menometorrhagia  . Carpal tunnel release 03/05/2009    bilateral    Family History  Problem Relation Age of Onset  . Hypertension Mother   . Diabetes Mother   . Heart disease Mother     CHF  . Coronary artery disease Mother   . Lung cancer Father   . Hypertension Father   . Diabetes Father   . Liver cancer Father     w/mets  . Prostate cancer Father   . Breast cancer Sister   . Lung disease Brother     Agent Orange Related  . Asthma Brother   . Allergies Brother   . Colon cancer Neg Hx     History  Substance Use Topics  . Smoking status: Former Smoker    Quit date: 06/01/1997  . Smokeless tobacco: Never Used   Comment: Quit 9 years ago.  . Alcohol Use: No    OB History    Grav Para Term Preterm Abortions TAB SAB Ect Mult Living                  Review of Systems  Constitutional: Negative for fever and chills.  HENT: Positive for tinnitus (occasional, intermittent, R ear). Negative for neck pain and neck stiffness.   Eyes: Negative for visual disturbance.  Respiratory: Negative for cough and shortness of breath.   Cardiovascular: Negative for chest pain and palpitations.  Gastrointestinal:  Negative for nausea, vomiting and abdominal pain.  Genitourinary: Negative for dysuria and frequency.  Musculoskeletal: Negative for back pain.  Neurological: Positive for dizziness and vertigo. Negative for loss of consciousness, syncope, weakness, light-headedness and loss of balance.  Psychiatric/Behavioral: Positive for confusion (worsening rapidly over past 1-1.5 mos). Negative for altered mental status.  All other systems reviewed and are negative.    Allergies  Aspirin; Codeine; Demerol; Latex; Lisinopril; and Meperidine hcl  Home Medications   Current Outpatient Rx  Name Route  Sig Dispense Refill  . ASPIRIN-ACETAMINOPHEN-CAFFEINE 250-250-65 MG PO TABS Oral Take 1 tablet by mouth every 6 (six) hours as needed. Per bottle instructions.PAIN 30 tablet 11  . AZELASTINE HCL 137 MCG/SPRAY NA SOLN Nasal Place 1 spray into the nose at bedtime. Use in each nostril as directed 30 mL 5  . BUDESONIDE-FORMOTEROL FUMARATE 80-4.5 MCG/ACT IN AERO Inhalation Inhale 2 puffs into the lungs 2 (two) times daily. 1 Inhaler 11  . CLONAZEPAM 1 MG PO TABS Oral Take 0.5 tablets (0.5 mg total) by mouth at bedtime as needed. Anxiety 30 tablet 5  . DICYCLOMINE HCL 10 MG PO CAPS  Take 1 tab twice daily for cramping and spasms. 60 capsule 1  . ESOMEPRAZOLE MAGNESIUM 40 MG PO CPDR Oral Take 40 mg by mouth daily.     Marland Kitchen EZETIMIBE 10 MG PO TABS Oral Take 1 tablet (10 mg total) by mouth daily. 90 tablet 1  . FERROUS SULFATE 325 (65 FE) MG PO TBEC Oral Take 325 mg by mouth daily.     . FUROSEMIDE 40 MG PO TABS Oral Take 1 tablet (40 mg total) by mouth daily. For HTN. 30 tablet 5  . GLUCOSE BLOOD VI STRP  Use as directed 100 each 11  . INSULIN GLARGINE 100 UNIT/ML [place] SOLN Subcutaneous Inject 40 Units into the skin daily. 20 mL 3  . LEVOTHYROXINE SODIUM 88 MCG PO TABS Oral Take 1 tablet (88 mcg total) by mouth daily. 90 tablet 3  . METFORMIN HCL 1000 MG PO TABS Oral Take 1 tablet (1,000 mg total) by mouth 2 (two) times daily with a meal. 180 tablet 3  . ADULT MULTIVITAMIN W/MINERALS CH Oral Take 1 tablet by mouth daily.    Marland Kitchen SITAGLIPTIN PHOSPHATE 100 MG PO TABS Oral Take 1 tablet (100 mg total) by mouth daily. 30 tablet 11  . SYRINGE/NEEDLE (DISP) 29G X 1/2" 1 ML MISC  Use as directed with lantus 200 each 0  . TIOTROPIUM BROMIDE MONOHYDRATE 18 MCG IN CAPS Inhalation Place 1 capsule (18 mcg total) into inhaler and inhale daily. 90 capsule 3  . VALSARTAN 160 MG PO TABS Oral Take 1 tablet (160 mg total) by mouth daily. 90 tablet 3  . VENLAFAXINE HCL 75 MG PO TABS Oral Take 1 tablet (75 mg total) by mouth 2 (two)  times daily. 60 tablet 11    BP 91/55  Pulse 79  Temp 98.8 F (37.1 C) (Oral)  Resp 17  SpO2 98%  Physical Exam  Nursing note and vitals reviewed. Constitutional: She is oriented to person, place, and time. She appears well-developed and well-nourished.  HENT:  Head: Normocephalic and atraumatic.  Eyes: EOM are normal. Pupils are equal, round, and reactive to light.  Cardiovascular: Normal rate, regular rhythm, normal heart sounds and intact distal pulses.   Pulmonary/Chest: Effort normal and breath sounds normal. No respiratory distress. She exhibits no tenderness.  Abdominal: Soft. Bowel sounds are normal. She exhibits no distension. There is no  tenderness.  Neurological: She is alert and oriented to person, place, and time. She has normal strength. No cranial nerve deficit or sensory deficit. She displays a negative Romberg sign. Coordination and gait normal. GCS eye subscore is 4. GCS verbal subscore is 5. GCS motor subscore is 6.  Reflex Scores:      Bicep reflexes are 2+ on the right side and 2+ on the left side.      Brachioradialis reflexes are 2+ on the right side and 2+ on the left side.      Patellar reflexes are 2+ on the right side and 2+ on the left side.      No inducible nystagmus. When walking and turned to the right to turn around, brief episode of feeling spinning sensation.  Skin: Skin is warm and dry.  Psychiatric: She has a normal mood and affect.    ED Course  Procedures (including critical care time)  Labs Reviewed  URINALYSIS, ROUTINE W REFLEX MICROSCOPIC - Abnormal; Notable for the following:    APPearance HAZY (*)     Bilirubin Urine SMALL (*)     Protein, ur 30 (*)     Leukocytes, UA LARGE (*)     All other components within normal limits  URINE MICROSCOPIC-ADD ON - Abnormal; Notable for the following:    Squamous Epithelial / LPF MANY (*)     Bacteria, UA FEW (*)     Casts HYALINE CASTS (*)     All other components within normal limits  URINE  CULTURE   No results found.   1. Vertigo       MDM  66 year old female presenting with vertigo. She states that she got up to walk to her son was, and felt a spinning sensation that lasted about 5 minutes. There were no other associated symptoms with this. She states that for the past several months she has had near daily episodes of a spinning sensation, that as best she can remember, are associated with position changes, and last for a couple of minutes. She typically has about one of these episodes a day. She has never had any other neuro complaints with this. 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 She was referred to neurology for this, and they are working on setting up an outpatient MRI to evaluate. Feel the patient's symptoms are most likely due to peripheral vertigo, and given the length of time over which may have been occurring, do not think any acute workup needs to be done currently. Discussed with the patient and family treatment at home (modified Epley, prn meclizine), close f/u with her PCP and neurologist, and indications for return, and they express understanding. Per family's request, checked UA- does have leuks, with some WBC, but only a few bacteria and many epithelial cells; do not think is currently consistent with UTI, but will send culture to evaluate.        Theotis Burrow, MD 06/21/12 [phone]

## 2012-06-21 NOTE — ED Notes (Signed)
Attempting a urine .

## 2012-06-21 NOTE — ED Provider Notes (Addendum)
I saw and evaluated the patient, reviewed the resident's note and I agree with the findings and plan.  Patient comes in with cc of vertigo. Patient has known vertigo and is being seen by Neurology and her primary care doctor. She is not on any meds at this time. The vertigo comes about 1-2 times a month, and when present it is positional, severe and lasts for a few minutes. No headaches, no tinnitus, sinus complains. Pt also has been having a few falls. No trauma to the head in over a week. PCP aware, and home nursing, PT has been scheduled. Patient's family feels comfortable taking care of her at this time. Patient had positional vertigo in the ER as well. We will give meclizine as prn after talking tot he patient and family. She can continue with Neurology and PCP follow up.  Derwood Kaplan, MD 06/21/12 1609  Derwood Kaplan, MD 06/22/12 [id number]

## 2012-06-21 NOTE — ED Notes (Addendum)
Pt reports sudden onset of headache. cbg 140. nsr on monitor. Stroke scale negative. Pt reports dizziness and feeling off balance. Pt has been tx for scabies recently.

## 2012-06-21 NOTE — ED Notes (Signed)
Pt waiting on a urine spec before discharge

## 2012-06-21 NOTE — ED Notes (Signed)
Dr. Leary Roca at the bedside with patient and family

## 2012-06-21 NOTE — Telephone Encounter (Signed)
Taper off effexor: every other day for 1 week, every third day for 10 days, then off

## 2012-06-21 NOTE — Telephone Encounter (Signed)
Refill accu check lancets

## 2012-06-21 NOTE — ED Notes (Signed)
Dr. Nanavati at bedside with patient and family.   

## 2012-06-22 ENCOUNTER — Observation Stay (HOSPITAL_COMMUNITY): Payer: Medicare HMO

## 2012-06-22 ENCOUNTER — Telehealth: Payer: Self-pay | Admitting: Internal Medicine

## 2012-06-22 ENCOUNTER — Observation Stay (HOSPITAL_COMMUNITY)
Admission: AD | Admit: 2012-06-22 | Discharge: 2012-06-24 | Disposition: A | Discharge status: Home / Self Care | Payer: Medicare HMO | Source: Ambulatory Visit | Attending: Internal Medicine | Admitting: Internal Medicine

## 2012-06-22 ENCOUNTER — Encounter: Payer: Self-pay | Admitting: Internal Medicine

## 2012-06-22 ENCOUNTER — Ambulatory Visit (INDEPENDENT_AMBULATORY_CARE_PROVIDER_SITE_OTHER): Payer: Medicare HMO | Admitting: Internal Medicine

## 2012-06-22 VITALS — BP 106/62 | HR 82 | Temp 98.0°F | Resp 16 | Wt 213.0 lb

## 2012-06-22 DIAGNOSIS — R4182 Altered mental status, unspecified: Secondary | ICD-10-CM | POA: Insufficient documentation

## 2012-06-22 DIAGNOSIS — J4489 Other specified chronic obstructive pulmonary disease: Secondary | ICD-10-CM | POA: Insufficient documentation

## 2012-06-22 DIAGNOSIS — B86 Scabies: Secondary | ICD-10-CM | POA: Insufficient documentation

## 2012-06-22 DIAGNOSIS — F0390 Unspecified dementia without behavioral disturbance: Principal | ICD-10-CM | POA: Insufficient documentation

## 2012-06-22 DIAGNOSIS — E119 Type 2 diabetes mellitus without complications: Secondary | ICD-10-CM | POA: Insufficient documentation

## 2012-06-22 DIAGNOSIS — I10 Essential (primary) hypertension: Secondary | ICD-10-CM | POA: Insufficient documentation

## 2012-06-22 DIAGNOSIS — E039 Hypothyroidism, unspecified: Secondary | ICD-10-CM | POA: Insufficient documentation

## 2012-06-22 DIAGNOSIS — R443 Hallucinations, unspecified: Secondary | ICD-10-CM

## 2012-06-22 DIAGNOSIS — F3289 Other specified depressive episodes: Secondary | ICD-10-CM | POA: Insufficient documentation

## 2012-06-22 DIAGNOSIS — Z79899 Other long term (current) drug therapy: Secondary | ICD-10-CM | POA: Insufficient documentation

## 2012-06-22 DIAGNOSIS — N390 Urinary tract infection, site not specified: Secondary | ICD-10-CM | POA: Insufficient documentation

## 2012-06-22 DIAGNOSIS — K219 Gastro-esophageal reflux disease without esophagitis: Secondary | ICD-10-CM | POA: Insufficient documentation

## 2012-06-22 DIAGNOSIS — Z794 Long term (current) use of insulin: Secondary | ICD-10-CM | POA: Insufficient documentation

## 2012-06-22 DIAGNOSIS — J449 Chronic obstructive pulmonary disease, unspecified: Secondary | ICD-10-CM | POA: Insufficient documentation

## 2012-06-22 DIAGNOSIS — F329 Major depressive disorder, single episode, unspecified: Secondary | ICD-10-CM | POA: Insufficient documentation

## 2012-06-22 DIAGNOSIS — R42 Dizziness and giddiness: Secondary | ICD-10-CM | POA: Insufficient documentation

## 2012-06-22 LAB — COMPREHENSIVE METABOLIC PANEL
ALT: 11 U/L (ref 0–35)
Calcium: 10.3 mg/dL (ref 8.4–10.5)
Creatinine, Ser: 1.18 mg/dL — ABNORMAL HIGH (ref 0.50–1.10)
GFR calc Af Amer: 54 mL/min — ABNORMAL LOW (ref >90)
Glucose, Bld: 215 mg/dL — ABNORMAL HIGH (ref 70–99)
Sodium: 135 mEq/L (ref 135–145)
Total Protein: 6.4 g/dL (ref 6.0–8.3)

## 2012-06-22 LAB — MAGNESIUM: Magnesium: 1.4 mg/dL — ABNORMAL LOW (ref 1.5–2.5)

## 2012-06-22 LAB — GLUCOSE, CAPILLARY: Glucose-Capillary: 207 mg/dL — ABNORMAL HIGH (ref 70–99)

## 2012-06-22 MED ORDER — SODIUM CHLORIDE 0.9 % IJ SOLN
3.0000 mL | Freq: Two times a day (BID) | INTRAMUSCULAR | Status: DC
  Administered 2012-06-23: 11:00:00 via INTRAVENOUS
  Administered 2012-06-24: 3 mL via INTRAVENOUS

## 2012-06-22 MED ORDER — DICYCLOMINE HCL 10 MG PO CAPS
10.0000 mg | ORAL_CAPSULE | Freq: Two times a day (BID) | ORAL | Status: DC | PRN
  Filled 2012-06-22: qty 1

## 2012-06-22 MED ORDER — INSULIN ASPART 100 UNIT/ML ~~LOC~~ SOLN
0.0000 [IU] | Freq: Three times a day (TID) | SUBCUTANEOUS | Status: DC
  Administered 2012-06-23 (×3): 3 [IU] via SUBCUTANEOUS

## 2012-06-22 MED ORDER — LINAGLIPTIN 5 MG PO TABS
5.0000 mg | ORAL_TABLET | Freq: Every day | ORAL | Status: DC
  Administered 2012-06-22 – 2012-06-24 (×3): 5 mg via ORAL
  Filled 2012-06-22 (×3): qty 1

## 2012-06-22 MED ORDER — BUDESONIDE-FORMOTEROL FUMARATE 80-4.5 MCG/ACT IN AERO
2.0000 | INHALATION_SPRAY | Freq: Two times a day (BID) | RESPIRATORY_TRACT | Status: DC
  Administered 2012-06-22 – 2012-06-24 (×4): 2 via RESPIRATORY_TRACT
  Filled 2012-06-22: qty 6.9

## 2012-06-22 MED ORDER — PANTOPRAZOLE SODIUM 40 MG PO TBEC
40.0000 mg | DELAYED_RELEASE_TABLET | Freq: Every day | ORAL | Status: DC
  Administered 2012-06-23: 40 mg via ORAL
  Filled 2012-06-22 (×2): qty 1

## 2012-06-22 MED ORDER — FUROSEMIDE 40 MG PO TABS
40.0000 mg | ORAL_TABLET | Freq: Every day | ORAL | Status: DC
  Administered 2012-06-23 – 2012-06-24 (×2): 40 mg via ORAL
  Filled 2012-06-22 (×3): qty 1

## 2012-06-22 MED ORDER — TIOTROPIUM BROMIDE MONOHYDRATE 18 MCG IN CAPS
18.0000 ug | ORAL_CAPSULE | Freq: Every day | RESPIRATORY_TRACT | Status: DC
  Administered 2012-06-22 – 2012-06-24 (×3): 18 ug via RESPIRATORY_TRACT
  Filled 2012-06-22: qty 5

## 2012-06-22 MED ORDER — VENLAFAXINE HCL 75 MG PO TABS
75.0000 mg | ORAL_TABLET | Freq: Two times a day (BID) | ORAL | Status: DC
  Administered 2012-06-23 – 2012-06-24 (×3): 75 mg via ORAL
  Filled 2012-06-22 (×4): qty 1

## 2012-06-22 MED ORDER — ENOXAPARIN SODIUM 40 MG/0.4ML ~~LOC~~ SOLN
40.0000 mg | SUBCUTANEOUS | Status: DC
  Administered 2012-06-22 – 2012-06-23 (×2): 40 mg via SUBCUTANEOUS
  Filled 2012-06-22 (×3): qty 0.4

## 2012-06-22 MED ORDER — SODIUM CHLORIDE 0.9 % IV SOLN: 250.0000 mL | INTRAVENOUS | Status: DC | PRN

## 2012-06-22 MED ORDER — ACETAMINOPHEN 650 MG RE SUPP: 650.0000 mg | Freq: Four times a day (QID) | RECTAL | Status: DC | PRN

## 2012-06-22 MED ORDER — VENLAFAXINE HCL 75 MG PO TABS
75.0000 mg | ORAL_TABLET | Freq: Two times a day (BID) | ORAL | Status: DC
  Filled 2012-06-22: qty 1

## 2012-06-22 MED ORDER — LEVOTHYROXINE SODIUM 88 MCG PO TABS
88.0000 ug | ORAL_TABLET | Freq: Every day | ORAL | Status: DC
  Administered 2012-06-23 – 2012-06-24 (×2): 88 ug via ORAL
  Filled 2012-06-22 (×3): qty 1

## 2012-06-22 MED ORDER — IRBESARTAN 150 MG PO TABS
150.0000 mg | ORAL_TABLET | Freq: Every day | ORAL | Status: DC
  Administered 2012-06-22 – 2012-06-24 (×3): 150 mg via ORAL
  Filled 2012-06-22 (×3): qty 1

## 2012-06-22 MED ORDER — INSULIN GLARGINE 100 UNIT/ML ~~LOC~~ SOLN
40.0000 [IU] | Freq: Every day | SUBCUTANEOUS | Status: DC
  Administered 2012-06-22 – 2012-06-23 (×2): 40 [IU] via SUBCUTANEOUS

## 2012-06-22 MED ORDER — MECLIZINE HCL 12.5 MG PO TABS
12.5000 mg | ORAL_TABLET | Freq: Three times a day (TID) | ORAL | Status: DC | PRN
Start: 2012-06-22 — End: 2012-06-24
  Filled 2012-06-22: qty 1

## 2012-06-22 MED ORDER — SODIUM CHLORIDE 0.9 % IJ SOLN: 3.0000 mL | INTRAMUSCULAR | Status: DC | PRN

## 2012-06-22 MED ORDER — CLONAZEPAM 0.5 MG PO TABS
0.5000 mg | ORAL_TABLET | Freq: Every evening | ORAL | Status: DC | PRN
  Administered 2012-06-22: 0.5 mg via ORAL
  Filled 2012-06-22: qty 1

## 2012-06-22 MED ORDER — MOXIFLOXACIN HCL 400 MG PO TABS: 400.0000 mg | ORAL_TABLET | Freq: Every day | ORAL | Status: DC

## 2012-06-22 MED ORDER — ACETAMINOPHEN 325 MG PO TABS: 650.0000 mg | ORAL_TABLET | Freq: Four times a day (QID) | ORAL | Status: DC | PRN

## 2012-06-22 MED ORDER — EZETIMIBE 10 MG PO TABS
10.0000 mg | ORAL_TABLET | Freq: Every day | ORAL | Status: DC
  Administered 2012-06-22 – 2012-06-24 (×3): 10 mg via ORAL
  Filled 2012-06-22 (×3): qty 1

## 2012-06-22 MED ORDER — ONDANSETRON HCL 4 MG/2ML IJ SOLN: 4.0000 mg | Freq: Four times a day (QID) | INTRAMUSCULAR | Status: DC | PRN

## 2012-06-22 MED ORDER — ONDANSETRON HCL 4 MG PO TABS: 4.0000 mg | ORAL_TABLET | Freq: Four times a day (QID) | ORAL | Status: DC | PRN

## 2012-06-22 MED ORDER — METFORMIN HCL 500 MG PO TABS
1000.0000 mg | ORAL_TABLET | Freq: Two times a day (BID) | ORAL | Status: DC
  Administered 2012-06-23 – 2012-06-24 (×3): 1000 mg via ORAL
  Filled 2012-06-22 (×5): qty 2

## 2012-06-22 NOTE — H&P (Signed)
[name] is an 66 y.o. female.   Chief Complaint: Rapidly progressive mental status decline with hallucinosis HPI: [name] has a history of progressive mental status change over the past several months but especially rapid progression over the past several weeks. She becomes disoriented, has hallucinations, calls out to people not living or present, decreasing ability to care for herself needing increased assistance with ADLs, frequent episodes of dizziness. She has had 3+ ED visits in the past month and several office visits. She is on psyhotropic drugs for pre-existing problems with depression.   Friday, July 19th she saw Dr. Terrace [name] for neurologic consult (note in wall chart) who diagnosed CNS degenerative disorder with possible underlying metabolic derangement. Lab in her office from the 19th: normal CBC, negative RPR, B12 228. Dr. Terrace [name] recommended an MRI with last study done June 07, 2010: mild scattered periventricular and subcoretical T2 hyperintensities c/w chronic microvascular ischemic disease vs other autoimmune/inflammatory conditions. Enlarged partially empty sella.  Patient was seen in the [facility] ED July 23rd for dizziness with a d/c diagnosis of labyrinthitis.  Patient admitted on observation for additional lab to rule out metabolic derangement, to get MRI brain and for neuro/psychiatric evaluation.  Past Medical History  Diagnosis Date  . Personal history of colonic polyps   . Iron deficiency anemia, unspecified   . Other malaise and fatigue   . Chest pain, unspecified   . Other B-complex deficiencies   . Arthritis     Right Sacroiliac joint  . Other and unspecified hyperlipidemia   . Anxiety and depression   . HTN (hypertension)   . Unspecified hypothyroidism   . Type II or unspecified type diabetes mellitus without mention of complication, not stated as uncontrolled   . Allergic rhinitis, cause unspecified   . Unspecified asthma   . Chronic airway obstruction, not  elsewhere classified   . Abnormal stress test 08/21/2008    Neg  . GERD (gastroesophageal reflux disease)     Past Surgical History  Procedure Date  . Foot surgery     hammer toe 2004, 2005  . Abdominal hysterectomy 1982    menometorrhagia  . Carpal tunnel release 03/05/2009    bilateral    Family History  Problem Relation Age of Onset  . Hypertension Mother   . Diabetes Mother   . Heart disease Mother     CHF  . Coronary artery disease Mother   . Lung cancer Father   . Hypertension Father   . Diabetes Father   . Liver cancer Father     w/mets  . Prostate cancer Father   . Breast cancer Sister   . Lung disease Brother     Agent Orange Related  . Asthma Brother   . Allergies Brother   . Colon cancer Neg Hx    Social History:  reports that she quit smoking about 15 years ago. She has never used smokeless tobacco. She reports that she does not drink alcohol or use illicit drugs.  Allergies:  Allergies  Allergen Reactions  . Aspirin Itching  . Codeine Itching  . Demerol Itching    Unknown-unconscious.  . Latex Itching  . Lisinopril Itching and Cough    REACTION: causes cough    Medications Prior to Admission  Medication Sig Dispense Refill  . azelastine (ASTELIN) 137 MCG/SPRAY nasal spray Place 1 spray into the nose 2 (two) times daily. Use in each nostril as directed      . budesonide-formoterol (SYMBICORT) 80-4.5 MCG/ACT inhaler  Inhale 2 puffs into the lungs 2 (two) times daily.  1 Inhaler  11  . cetirizine (ZYRTEC) 10 MG tablet Take 10 mg by mouth daily as needed. allergies      . clonazePAM (KLONOPIN) 1 MG tablet Take 0.5 tablets (0.5 mg total) by mouth at bedtime as needed. Anxiety  30 tablet  5  . dicyclomine (BENTYL) 10 MG capsule Take 10 mg by mouth 2 (two) times daily as needed. cramping and spasms.      Marland Kitchen esomeprazole (NEXIUM) 40 MG capsule Take 40 mg by mouth daily.       Marland Kitchen ezetimibe (ZETIA) 10 MG tablet Take 1 tablet (10 mg total) by mouth daily.  90  tablet  1  . ferrous sulfate 325 (65 FE) MG EC tablet Take 325 mg by mouth daily.       . furosemide (LASIX) 40 MG tablet Take 1 tablet (40 mg total) by mouth daily. For HTN.  30 tablet  5  . glucose blood (ACCU-CHEK AVIVA) test strip Use as directed  306 each  11  . hydroxypropyl methylcellulose (ISOPTO TEARS) 2.5 % ophthalmic solution Place 1 drop into both eyes 3 (three) times daily as needed. Dry eyes      . insulin glargine (LANTUS) 100 UNIT/ML injection Inject 40 Units into the skin at bedtime.      Marland Kitchen levothyroxine (SYNTHROID) 88 MCG tablet Take 1 tablet (88 mcg total) by mouth daily.  90 tablet  3  . meclizine (ANTIVERT) 12.5 MG tablet Take 1 tablet (12.5 mg total) by mouth 3 (three) times daily as needed for dizziness.  30 tablet  0  . metFORMIN (GLUCOPHAGE) 1000 MG tablet Take 1 tablet (1,000 mg total) by mouth 2 (two) times daily with a meal.  180 tablet  3  . sitaGLIPtin (JANUVIA) 100 MG tablet Take 1 tablet (100 mg total) by mouth daily.  30 tablet  11  . Syringe/Needle, Disp, 29G X 1/2" 1 ML MISC Use as directed with lantus  200 each  0  . tiotropium (SPIRIVA HANDIHALER) 18 MCG inhalation capsule Place 1 capsule (18 mcg total) into inhaler and inhale daily.  90 capsule  3  . valsartan (DIOVAN) 160 MG tablet Take 1 tablet (160 mg total) by mouth daily.  90 tablet  3  . venlafaxine (EFFEXOR) 75 MG tablet Take 1 tablet (75 mg total) by mouth 2 (two) times daily.  60 tablet  11  . DISCONTD: moxifloxacin (AVELOX) 400 MG tablet Take 1 tablet (400 mg total) by mouth daily.  7 tablet  0    Results for orders placed during the hospital encounter of 06/21/12 (from the past 48 hour(s))  URINALYSIS, ROUTINE W REFLEX MICROSCOPIC     Status: Abnormal   Collection Time   06/21/12  3:34 PM      Component Value Range Comment   Color, Urine YELLOW  YELLOW    APPearance HAZY (*) CLEAR    Specific Gravity, Urine 1.018  1.005 - 1.030    pH 5.5  5.0 - 8.0    Glucose, UA NEGATIVE  NEGATIVE mg/dL     Hgb urine dipstick NEGATIVE  NEGATIVE    Bilirubin Urine SMALL (*) NEGATIVE    Ketones, ur NEGATIVE  NEGATIVE mg/dL    Protein, ur 30 (*) NEGATIVE mg/dL    Urobilinogen, UA 0.2  0.0 - 1.0 mg/dL    Nitrite NEGATIVE  NEGATIVE    Leukocytes, UA LARGE (*) NEGATIVE   URINE MICROSCOPIC-ADD  ON     Status: Abnormal   Collection Time   06/21/12  3:34 PM      Component Value Range Comment   Squamous Epithelial / LPF MANY (*) RARE    WBC, UA 21-50  <3 WBC/hpf    Bacteria, UA FEW (*) RARE    Casts HYALINE CASTS (*) NEGATIVE    No results found.  Review of Systems  Constitutional: Positive for malaise/fatigue. Negative for fever, chills and weight loss.  HENT: Positive for tinnitus. Negative for congestion, sore throat, neck pain and ear discharge.   Eyes: Negative for double vision and photophobia.  Respiratory: Negative for cough, hemoptysis, shortness of breath and wheezing.   Cardiovascular: Negative for chest pain, palpitations and PND.  Gastrointestinal: Positive for nausea. Negative for vomiting, abdominal pain, diarrhea, constipation and blood in stool.  Genitourinary: Positive for dysuria, urgency and frequency.  Musculoskeletal: Positive for back pain and joint pain. Negative for myalgias.  Skin: Positive for itching. Negative for rash.  Neurological: Positive for speech change and weakness. Negative for dizziness, tremors, sensory change and headaches.       Somnolent, hallucinating, disoriented and confused, poor thought content  Endo/Heme/Allergies: Negative for polydipsia. Does not bruise/bleed easily.  Psychiatric/Behavioral: Positive for hallucinations and memory loss. Negative for depression and substance abuse. The patient is nervous/anxious.     There were no vitals taken for this visit. Physical Exam  T 98  106/62  82  12  94% Gen'l - obese white woman in no distress but somnolent HEENT - C&S clear, no sign of trauma Neck- supple, no thyromegaly NOdes - negative  neck Cor- 2+ radial pulse, no JVD, no carotid bruit, quiet precordium. RRR PUlm - normal respiration, no rales or wheezes Abd- obese, BS+, soft, no guarding or rebound Genitalia deferred Ext - no deformity Derm - small 1 mm  Point lesions on low back (bites) Neuro - awake but easily falls asleep. Speech is clear. MMSE - oriented to person, day ,date, location. Cannot do serial sevens. MS - MAE offers resistence, no focal weakness.  Assessment/Plan 1. Neuro - rapidly progressive decline in mental status. Possibly dementia vs other CNS disease vs metabolic derangement exacerbating dementia. She is at a point where she cannot be managed at home. Initial eval is tentative but, as noted, CBC - nl, RPR - negative, B12 low.  Plan  Additional lab to r/o metabolic derangement: Cmet, TSH, A1C,   MRI brain w/ contrast  May need neuro consult; may need psychiatric consult  2. DM - will continue home meds  SS coverage while in hospital  3. Pulm  Continue home medications.  Illene Regulus 06/22/2012, 5:24 PM

## 2012-06-22 NOTE — Telephone Encounter (Signed)
Dr Maple Hudson Please advise if ok to refill. Thanks.

## 2012-06-22 NOTE — Telephone Encounter (Signed)
Left message on cell #, Home # is disconnected. Of instructions to taper off medication effexor

## 2012-06-22 NOTE — Telephone Encounter (Signed)
Caller: Jennifer/Child; PCP: Illene Regulus; CB#: [phone];  Call regarding  Worsening Confusion; Sleeping all the time;  Describing hallucinations; had to remove sharp objects from her room d/t attempting to dig worms out of arms (veins) and  conversations with dead relatives. FBS 139.  Seen by neurologist 06/15/12 ; MRI scheduling pending.  Concerned aobut worsening status.  Asking if could be r/t meds.  Advised to see MD now for acute onset or worsening of hallucinations or change in mental status per Dementia Guideline.  Pt will require 30 min visit; only 15 min appt remains with Dr Debby Bud at 1430.  Contacted office nurse/Nancy who approved scheduling at 1630 for last appt 06/22/12.

## 2012-06-22 NOTE — Telephone Encounter (Signed)
Caller: [name]/Child; PCP: Illene Regulus; CB#: [phone]; ; ; Call regarding Scabies;   did her second treatment for scabies 06-21-12 and today is complaining of itching.  She has dementia so she thinks she sees worms on her hands.  Her daughter in law has said she sees no live mites and they have done her linen etc in hot water.  Per standing order on client sheet I told her some itching can be expected for up to 2 wks post treatment.  I did review some comfort measures with her .

## 2012-06-23 ENCOUNTER — Encounter (HOSPITAL_COMMUNITY): Payer: Self-pay | Admitting: *Deleted

## 2012-06-23 ENCOUNTER — Observation Stay (HOSPITAL_COMMUNITY): Payer: Medicare HMO

## 2012-06-23 DIAGNOSIS — F0390 Unspecified dementia without behavioral disturbance: Principal | ICD-10-CM

## 2012-06-23 DIAGNOSIS — F329 Major depressive disorder, single episode, unspecified: Secondary | ICD-10-CM

## 2012-06-23 DIAGNOSIS — R4182 Altered mental status, unspecified: Secondary | ICD-10-CM

## 2012-06-23 DIAGNOSIS — R443 Hallucinations, unspecified: Secondary | ICD-10-CM

## 2012-06-23 LAB — GLUCOSE, CAPILLARY
Glucose-Capillary: 134 mg/dL — ABNORMAL HIGH (ref 70–99)
Glucose-Capillary: 176 mg/dL — ABNORMAL HIGH (ref 70–99)

## 2012-06-23 MED ORDER — GADOBENATE DIMEGLUMINE 529 MG/ML IV SOLN: 20.0000 mL | Freq: Once | INTRAVENOUS | Status: AC | PRN

## 2012-06-23 MED ORDER — MEMANTINE HCL 5 MG PO TABS
5.0000 mg | ORAL_TABLET | Freq: Every day | ORAL | Status: DC
  Administered 2012-06-23 – 2012-06-24 (×2): 5 mg via ORAL
  Filled 2012-06-23 (×2): qty 1

## 2012-06-23 MED ORDER — HYDROXYZINE HCL 50 MG PO TABS
50.0000 mg | ORAL_TABLET | Freq: Three times a day (TID) | ORAL | Status: DC | PRN
  Filled 2012-06-23: qty 1

## 2012-06-23 MED ORDER — OLANZAPINE 2.5 MG PO TABS
2.5000 mg | ORAL_TABLET | Freq: Every day | ORAL | Status: DC
  Administered 2012-06-23: 2.5 mg via ORAL
  Filled 2012-06-23 (×2): qty 1

## 2012-06-23 MED ORDER — HYDROXYZINE HCL 25 MG PO TABS
25.0000 mg | ORAL_TABLET | Freq: Three times a day (TID) | ORAL | Status: DC | PRN
Start: 2012-06-23 — End: 2012-06-23
  Administered 2012-06-23: 25 mg via ORAL
  Filled 2012-06-23: qty 1

## 2012-06-23 NOTE — Consult Note (Addendum)
Patient Identification:  [name] Date of Evaluation:  06/23/2012 Reason for Consult: Auditory Hallucinations Referring Provider: Dr. Debby Bud  History of Present Illness:Pt presents with dizziness associated with sensation of spinning. According to pt she has been greatly bothered with pruritis due to scabies [brought home by gdson who stayed over at someone's house.  She says she is scratching constantly.   She says she hears her mother in the other room.  She knows her mother died 19  Past Psychiatric History:declined to report   Past Medical History:     Past Medical History  Diagnosis Date  . Personal history of colonic polyps   . Iron deficiency anemia, unspecified   . Other malaise and fatigue   . Chest pain, unspecified   . Other B-complex deficiencies   . Arthritis     Right Sacroiliac joint  . Other and unspecified hyperlipidemia   . Anxiety and depression   . HTN (hypertension)   . Unspecified hypothyroidism   . Type II or unspecified type diabetes mellitus without mention of complication, not stated as uncontrolled   . Allergic rhinitis, cause unspecified   . Unspecified asthma   . Chronic airway obstruction, not elsewhere classified   . Abnormal stress test 08/21/2008    Neg  . GERD (gastroesophageal reflux disease)        Past Surgical History  Procedure Date  . Foot surgery     hammer toe 2004, 2005  . Abdominal hysterectomy 1982    menometorrhagia  . Carpal tunnel release 03/05/2009    bilateral    Allergies:  Allergies  Allergen Reactions  . Aspirin Itching  . Codeine Itching  . Demerol Itching    Unknown-unconscious.  . Latex Itching  . Lisinopril Itching and Cough    REACTION: causes cough    Current Medications:  Prior to Admission medications   Medication Sig Start Date End Date Taking? Authorizing Provider  azelastine (ASTELIN) 137 MCG/SPRAY nasal spray Place 1 spray into the nose 2 (two) times daily. Use in each nostril as  directed 12/21/11  Yes Jacques Navy, MD  budesonide-formoterol [facility]) 80-4.5 MCG/ACT inhaler Inhale 2 puffs into the lungs 2 (two) times daily. 07/27/11  Yes Jacques Navy, MD  clonazePAM (KLONOPIN) 1 MG tablet Take 0.5 tablets (0.5 mg total) by mouth at bedtime as needed. Anxiety 05/13/12  Yes Etta Grandchild, MD  dicyclomine (BENTYL) 10 MG capsule Take 10 mg by mouth 2 (two) times daily as needed. cramping and spasms. 06/01/12  Yes Meredith Pel, NP  esomeprazole (NEXIUM) 40 MG capsule Take 40 mg by mouth daily.    Yes Historical Provider, MD  ezetimibe (ZETIA) 10 MG tablet Take 1 tablet (10 mg total) by mouth daily. 03/18/12  Yes Jacques Navy, MD  ferrous sulfate 325 (65 FE) MG EC tablet Take 325 mg by mouth daily.    Yes Historical Provider, MD  furosemide (LASIX) 40 MG tablet Take 1 tablet (40 mg total) by mouth daily. For HTN. 12/24/11  Yes Jacques Navy, MD  glucose blood (ACCU-CHEK AVIVA) test strip Use as directed 06/21/12  Yes Jacques Navy, MD  insulin glargine (LANTUS) 100 UNIT/ML injection Inject 40 Units into the skin at bedtime. 01/12/12  Yes Jacques Navy, MD  levothyroxine (SYNTHROID) 88 MCG tablet Take 1 tablet (88 mcg total) by mouth daily. 07/08/11 07/07/12 Yes Jacques Navy, MD  meclizine (ANTIVERT) 12.5 MG tablet Take 1 tablet (12.5 mg total) by mouth  3 (three) times daily as needed for dizziness. 06/21/12 07/01/12 Yes Katie Mahoney-Tesoriero, MD  metFORMIN (GLUCOPHAGE) 1000 MG tablet Take 1 tablet (1,000 mg total) by mouth 2 (two) times daily with a meal. 08/24/11  Yes Jacques Navy, MD  sitaGLIPtin (JANUVIA) 100 MG tablet Take 1 tablet (100 mg total) by mouth daily. 06/09/12 06/09/13 Yes Jacques Navy, MD  Syringe/Needle, Disp, 29G X 1/2" 1 ML MISC Use as directed with lantus 05/16/12  Yes Jacques Navy, MD  tiotropium (SPIRIVA HANDIHALER) 18 MCG inhalation capsule Place 1 capsule (18 mcg total) into inhaler and inhale daily. 07/27/11  Yes Jacques Navy, MD   valsartan (DIOVAN) 160 MG tablet Take 1 tablet (160 mg total) by mouth daily. 06/09/12  Yes Jacques Navy, MD  venlafaxine (EFFEXOR) 75 MG tablet Take 1 tablet (75 mg total) by mouth 2 (two) times daily. 06/09/12  Yes Jacques Navy, MD    Social History:    reports that she quit smoking about 15 years ago. She has never used smokeless tobacco. She reports that she does not drink alcohol or use illicit drugs.   Family History:    Family History  Problem Relation Age of Onset  . Hypertension Mother   . Diabetes Mother   . Heart disease Mother     CHF  . Coronary artery disease Mother   . Lung cancer Father   . Hypertension Father   . Diabetes Father   . Liver cancer Father     w/mets  . Prostate cancer Father   . Breast cancer Sister   . Lung disease Brother     Agent Orange Related  . Asthma Brother   . Allergies Brother   . Colon cancer Neg Hx     Mental Status Examination/Evaluation: Objective:  Appearance: Fairly Groomed  Psychomotor Activity:  Increased, Tremor and gros tremor both hands and mouth  Eye Contact::  Good  Speech:  Clear and Coherent  Volume:  Normal  Mood:  Euthymic  Affect:  Appropriate  Thought Process:  Coherent, Relevant and mildly guarded  Orientation:  Full Oriented to person place and situation  Thought Content:  Delusions  Auditory hallucinations  Suicidal Thoughts:  No  Homicidal Thoughts:  No  Judgement:  Fair  Insight:  Fair    DIAGNOSIS:   AXIS I   Early Dementia (EMSE), Delusional Disorder  AXIS II  Deffered  AXIS III See medical notes.  AXIS IV housing problems, other psychosocial or environmental problems, problems related to social environment and problems with primary support group  AXIS V 51-60 moderate symptoms   Assessment/Plan:  Discussed with Dr. Debby Bud, Psych CSW - note is appreciated This pt is sitting up in bed.  Daughter in law leaves for meal.  Pt is awake, alert and has spontaneous speech.  She reports the  itching and also the fact that she hears her mother in the next room.  She admits she sometimes talks back to her.  She claims that she manages her own ADLs contrary to family reports who live with her.  She knows she has 6 children and 11 grandchildren.   She does not elaborate on the delusions she reports to the CSW with daughter and daughter in law present.  She has auditory hallucinations and visual hallucinations.  She is unable to subtract serial 7s (100-17=13) but is very accurate on serial 20s subtract 3.  She was able to recall 1 of 3 objects to memorize.  Given the family report of memory lossShe denies suicidal/homicidal thoughts.  "I like my live too much".  She exhibits full affect.  She says she does her CBG.  But it is not known how she manages since her daughter in law says she gives pt all her medications.  RECOMMENDATION:  1.  Pt has borderline mini mental status examination. But has capacity to make decisions about health care; supervision is indicated at this time to assist her in adherence to her regimen.  Her condition needs close follow up yet if the family is willing to have her live at home that would be the first recommendation.  She speaks as thought she is very attached to her family.  2.  Suggest Zyprexa, quetiapine,  2.5 mg in pm for hallucinations.  3. Suggest Cogentin, benztropine, 0.5 mg 2 X daily PRN for EPS or TD. 4.  Consider Namenda, memantadine, 5 mg 2 X daily to slow, not correct memory loss.  5.  Consider increase of Vistaril 50 mg 3 X daily for pruritis; caution on dose if too drowsy 6.  Consider transfer to home when medically stable if family agrees.  Darl Brisbin J. Ferol Luz, MD Psychiatrist  06/23/2012 4:35 PM

## 2012-06-23 NOTE — Progress Notes (Signed)
Clinical Social Work Department CLINICAL SOCIAL WORK PSYCHIATRY SERVICE LINE ASSESSMENT 06/23/2012  Patient:  [name]  Account:  [id number]  Admit Date:  06/22/2012  Clinical Social Worker:  Doroteo Glassman  Date/Time:  06/23/2012 12:47 PM Referred by:  Physician  Date referred:  06/23/2012 Reason for Referral  Behavioral Health Issues   Presenting Symptoms/Problems (In the person's/family's own words):   Pt presented due to AMS. Pt experiencing hallucinations and delusions.    Abuse/Neglect/Trauma Comments:   Psychiatric History (check all that apply)  Outpatient treatment   Psychiatric medications:  Effexor   Current Mental Health Hospitalizations/Previous Mental Health History:   Current provider:   PCP prescribes Effexor.  Denies outpt tx for mental health   Place and Date:   Current Medications:   See H&P   Previous Impatient Admission/Date/Reason:   denies   Emotional Health / Current Symptoms    Suicide/Self Harm  None reported   Suicide attempt in the past:   Denies   Other harmful behavior:   Psychotic/Dissociative Symptoms  Auditory Hallucinations  Delusional  Confusion   Other Psychotic/Dissociative Symptoms:    Attention/Behavioral Symptoms  Within Normal Limits   Other Attention / Behavioral Symptoms:    Cognitive Impairment  Within Normal Limits   Other Cognitive Impairment:    Mood and Adjustment  Euthymic    Stress, Anxiety, Trauma, Any Recent Loss/Stressor  Grief/Loss (recent or history)   Anxiety (frequency):   Phobia (specify):   Compulsive behavior (specify):   Obsessive behavior (specify):   Other:   Pt's mother died in May 15, 2004.    SBIRT completed (please refer for detailed history):    Self-reported substance use:   Urinary Drug Screen Completed:  N Alcohol level:    Environmental/Housing/Living Arrangement  Stable housing   Who is in the home:   Son and daughter-in-law   Emergency contact:    Daughter and son.  Both listed on Facesheet.   Financial  Medicare  Medicaid   Patient's Strengths and Goals (patient's own words):   Clinical Social Worker's Interpretive Summary:   Met with Pt, her daughter and her daughter-in-law.    Pt reports that she may continue to be mourning the death of her mother.  She explains that her mother died in 05/15/2004 and that she hears her mother calling out for her.  Pt denies that these sounds are scarey, intrusive or commanding in nature.    Additionally, Pt, as well as other family members, as verified by daughter and daughter-in-law, contracted scabies.  After several types of tx, Pt continues to feel that she has "worms" in her body and has attempted to extract these worms from her knuckles with tweezers.  Pt did no harm to herself but was unconvinced that what she thinks are worms are really wrinkles.    Pt reports that several "black people" entered her home with a baby and sat in her living room and talked amongst themselves.  She stated that didn't interact with her but that their presence was pleasant in nature.  Pt was not fearful.  Pt stated that she's unsure how they go into her home; she states that she may have let them in.    Pt thought that she had 9 nine rats crawl into bed with her.  She stated that they all settled down and went to sleep.  Pt reported no fear of the rats nor did she feel that they were trying to harm her.    Pt's  family express concern due to Pt's excessive sleeping and not wanting to get out and visit with family, as she was doing 2-3 months ago.  Family has noticed a decline in Pt's volition and mental status.  Family has no concerns re: SI or HI; Pt denies SI, HI.  Family questioning if current symptoms are due to mental health decline or medical pxs.    Pt reports pxs with memory but states that she feels fine. She does admit to sleeping more than she used to but states that she intended to do this once she retired.     Pt and family state that attending MD wants to adjust Pt's psychotropic medication and that it's intention to wean her off of Effexor.  Pt amenable to psychotropic medication and reports benefit.    CSW thanked Pt and her family for their time.   Disposition:  Recommend Psych CSW continuing to support while in hospital  CSW to continue to follow.  Providence Crosby, LCSWA Clinical Social Work [phone]

## 2012-06-23 NOTE — Progress Notes (Signed)
Pt scratching arms and chest.  PT and her family requesting something for itching.  Dr Debby Bud called and informed.

## 2012-06-23 NOTE — Progress Notes (Signed)
  Subjective:    Patient ID: [name], female    DOB: 02-Apr-1946, 66 y.o.   MRN: [id number]  HPI Admitted to hospital for mental status changes - see admit note.   Review of Systems     Objective:   Physical Exam        Assessment & Plan:

## 2012-06-23 NOTE — Telephone Encounter (Signed)
Ok to refill doxycycline 

## 2012-06-23 NOTE — Progress Notes (Signed)
Subjective: NO events during the night. Many family present. She is awake and alert  Objective: Lab: Lab Results  Component Value Date   WBC 7.6 05/23/2012   HGB 13.0 05/23/2012   HCT 39.0 05/23/2012   MCV 89.0 05/23/2012   PLT 289 05/23/2012   BMET    Component Value Date/Time   NA 135 06/22/2012 1828   K 3.5 06/22/2012 1828   CL 95* 06/22/2012 1828   CO2 27 06/22/2012 1828   GLUCOSE 215* 06/22/2012 1828   BUN 32* 06/22/2012 1828   CREATININE 1.18* 06/22/2012 1828   CALCIUM 10.3 06/22/2012 1828   GFRNONAA 46* 06/22/2012 1828   GFRAA 54* 06/22/2012 1828  A1C 8.2%, TSH 6.974, Mg 1.4 ( nl 1.5-2.5)   Imaging: MRI brain with contrast: IMPRESSION:  No acute abnormality. Atrophy and mild to moderate chronic  microvascular ischemic changes in the white matter and pons.  Scheduled Meds:   . budesonide-formoterol  2 puff Inhalation BID  . enoxaparin (LOVENOX) injection  40 mg Subcutaneous Q24H  . ezetimibe  10 mg Oral Daily  . furosemide  40 mg Oral Daily  . insulin aspart  0-15 Units Subcutaneous TID WC  . insulin glargine  40 Units Subcutaneous QHS  . irbesartan  150 mg Oral Daily  . levothyroxine  88 mcg Oral QAC breakfast  . linagliptin  5 mg Oral Daily  . metFORMIN  1,000 mg Oral BID WC  . pantoprazole  40 mg Oral QAC lunch  . sodium chloride  3 mL Intravenous Q12H  . tiotropium  18 mcg Inhalation Daily  . venlafaxine  75 mg Oral BID  . DISCONTD: venlafaxine  75 mg Oral BID   Continuous Infusions:  PRN Meds:.sodium chloride, acetaminophen, acetaminophen, clonazePAM, dicyclomine, gadobenate dimeglumine, meclizine, ondansetron (ZOFRAN) IV, ondansetron, sodium chloride   Physical Exam: Filed Vitals:   06/23/12 0542  BP: 147/66  Pulse: 63  Temp: 97.2 F (36.2 C)  Resp: 18   Gen'l - overweight white woman in bed eating breakfast PUlm - no increased WOB, no wheeze Neuro- Awake, oriented,       Assessment/Plan: 1. Neuro - no change overnight. MRI negative, labs normal  - no metabolic derangement. Working diagnosis is dementia Plan - Psych consult  2,3) stable   Illene Regulus 06/23/2012, 8:33 AM

## 2012-06-23 NOTE — Progress Notes (Signed)
Reviewed notes from Ms. Simpson and Dr. Ferol Luz. Agree with Dr. Blenda Peals recommendations - new orders entered

## 2012-06-24 MED ORDER — MOXIFLOXACIN HCL 400 MG PO TABS: 400.0000 mg | ORAL_TABLET | Freq: Every day | ORAL | Status: AC

## 2012-06-24 MED ORDER — HYDROXYZINE HCL 50 MG PO TABS: 50.0000 mg | ORAL_TABLET | Freq: Three times a day (TID) | ORAL | Status: AC | PRN

## 2012-06-24 MED ORDER — VENLAFAXINE HCL 75 MG PO TABS: 75.0000 mg | ORAL_TABLET | Freq: Two times a day (BID) | ORAL | Status: DC

## 2012-06-24 MED ORDER — OLANZAPINE 2.5 MG PO TABS: 2.5000 mg | ORAL_TABLET | Freq: Every day | ORAL | Status: DC

## 2012-06-24 MED ORDER — MEMANTINE HCL 5 MG PO TABS: 5.0000 mg | ORAL_TABLET | Freq: Every day | ORAL | Status: DC

## 2012-06-24 NOTE — Progress Notes (Signed)
Voices understanding of d/c instructions no changes noted since am assessment. 

## 2012-06-24 NOTE — Progress Notes (Signed)
Subjective: Per RN patient had a quiet night.  Objective: Lab: Lab Results  Component Value Date   WBC 7.6 05/23/2012   HGB 13.0 05/23/2012   HCT 39.0 05/23/2012   MCV 89.0 05/23/2012   PLT 289 05/23/2012   BMET    Component Value Date/Time   NA 135 06/22/2012 1828   K 3.5 06/22/2012 1828   CL 95* 06/22/2012 1828   CO2 27 06/22/2012 1828   GLUCOSE 215* 06/22/2012 1828   BUN 32* 06/22/2012 1828   CREATININE 1.18* 06/22/2012 1828   CALCIUM 10.3 06/22/2012 1828   GFRNONAA 46* 06/22/2012 1828   GFRAA 54* 06/22/2012 1828     Imaging: no nedw imaging  Scheduled Meds:   . budesonide-formoterol  2 puff Inhalation BID  . enoxaparin (LOVENOX) injection  40 mg Subcutaneous Q24H  . ezetimibe  10 mg Oral Daily  . furosemide  40 mg Oral Daily  . insulin aspart  0-15 Units Subcutaneous TID WC  . insulin glargine  40 Units Subcutaneous QHS  . irbesartan  150 mg Oral Daily  . levothyroxine  88 mcg Oral QAC breakfast  . linagliptin  5 mg Oral Daily  . memantine  5 mg Oral Daily  . metFORMIN  1,000 mg Oral BID WC  . OLANZapine  2.5 mg Oral QHS  . pantoprazole  40 mg Oral QAC lunch  . sodium chloride  3 mL Intravenous Q12H  . tiotropium  18 mcg Inhalation Daily  . venlafaxine  75 mg Oral BID   Continuous Infusions:  PRN Meds:.sodium chloride, acetaminophen, acetaminophen, clonazePAM, dicyclomine, gadobenate dimeglumine, hydrOXYzine, meclizine, ondansetron (ZOFRAN) IV, ondansetron, sodium chloride, DISCONTD: hydrOXYzine   Physical Exam: Filed Vitals:   06/24/12 0611  BP: 162/84  Pulse: 77  Temp: 97.9 F (36.6 C)  Resp: 18    @IOlastshift @     Assessment/Plan: Stable and ready for discharge.  Dictated # [id number]   Illene Regulus 06/24/2012, 6:22 AM

## 2012-06-24 NOTE — Progress Notes (Addendum)
Chart review.  Noted that Pt d/c'd this morning ~1030.  CSW to mail Pt information on grief counseling resources.  **Mailed information for [facility] grief counseling resources.  Providence Crosby, LCSWA Clinical Social Work [phone]

## 2012-06-24 NOTE — Telephone Encounter (Signed)
Sent in refill ok per Dr. Maple Hudson.

## 2012-06-25 NOTE — ED Provider Notes (Signed)
I saw and evaluated the patient, reviewed the resident's note and I agree with the findings and plan.  Derwood Kaplan, MD 06/25/12 1438

## 2012-06-25 NOTE — Discharge Summary (Signed)
[name], [name]              ACCOUNT NO.:  [id number]  MEDICAL RECORD NO.:  [id number]  LOCATION:  1505                         FACILITY:  [facility]  PHYSICIAN:  Rosalyn Gess. Norins, MD  DATE OF BIRTH:  [birth year]  DATE OF ADMISSION:  06/22/2012 DATE OF DISCHARGE:  06/24/2012                              DISCHARGE SUMMARY   ADMITTING DIAGNOSES: 1. Mental status changes. 2. Diabetes 3. Chronic obstructive pulmonary disease.  DISCHARGE DIAGNOSES: 1. Mental status changes. 2. Diabetes 3. Chronic obstructive pulmonary disease.  CONSULTANTS:  Mickeal Skinner, MD. for Psychiatry.  PROCEDURES:  MRI scan of the brain, performed on 06/23/2012, which showed no acute abnormality.  Atrophy and mild-to-moderate chronic microvascular ischemic changes in the white matter and pons.  HISTORY OF PRESENT ILLNESS:  [name] is a 66 year old woman followed for hypertension, diabetes, COPD, morbid obesity, and depression, and over the past several months has had progressive mental status changes with confusion and auditory and visual hallucinations.  This has become particularly acute over the past several weeks.  The patient becomes disoriented, has hallucinations, called out the people who are not living or present.  She has had decreasing ability to care for herself by her family's report, with the need for constant attendance.  She has had 1 or 2 ER visits for dizziness and disequilibrium, most recently several days ago where she was found to have a normal examination, was thought to have labyrinthitis.  Question of a positive urinalysis.  The patient has been on psychotropic drugs for depression, using venlafaxine.  Family has a concern this was contributing to her symptoms, and at the time of admission a taper has just been started. The patient had been seen by Dr. [name] for Neurology on June 17, 2012.  In conversation with Dr. [name], she felt the patient had a CNS related dementia, but was  ruling out metabolic abnormalities as potential precipitating factor.  She had a CBC in Dr. Zannie Cove office that was normal, also B12 that was normal and RPR that was negative.  MRI scan was to be scheduled as an outpatient.  The patient presented to the office on the day of admission, because of increasing symptoms with hallucinations, both auditory and visual, and continued difficulty with managing her ADL.  For this reason, the patient was admitted to the hospital on observation status.  Please see the H and P for past medical history, family history, social history, and admission exam.  HOSPITAL COURSE: 1. Mental status changes.  The patient was oriented to person, place,     and context at time of admission.  She was admitted to a regular     floor.  The patient did have an MRI scan of the brain with results     as noted.  She had general chemistries, thyroid function was     normal.  The patient was seen in consultation by Dr. Ferol Luz, for Psychiatry.  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 Axis II:  Deferred. Axis III:  Medical. Axis IV:  Housing problems, other psychosocial environmental problems. Axis V:  51-60, moderate symptoms.  She felt the patient had borderline mental status exam,  but did have the capacity to make decisions about her health care.  Supervision is indicated at this time to sister in adherence to her medical regimen. She does require close followup.  If the family is willing to have her live at home, that will be first and primary recommendation.  Zyprexa 2.5 mg q.p.m. was recommended to manage her hallucinations.  Cogentin 0.5 mg b.i.d. if needed should the patient develop extrapyramidal syndrome or tardive symptoms.  Recommended to consider Namenda and noted be starting with a titration pack which will be forwarded to the patient's pharmacy.  It was recommended to increase Vistaril to 50 mg t.i.d. for pruritus.  The  patient had a stable night.  She does seem more oriented.  She is willing to take her medications.  At this time, with no need for further inpatient evaluation.  She is ready for discharge to home.  1. Diabetes, the patient has remained stable.  She will continue on     her home regimen. 2. Chronic obstructive pulmonary disease, the patient has been stable     with no respiratory distress.  She will continue on her home     regimen. 3. Urinary tract infection, the patient will be treated with Cipro 250     b.i.d. for 5 days. 4. Early depression, the patient will be tapering off her venlafaxine.     She is due for a dose on Saturday and then Monday, and she will     have a dose every third day for 3 doses and then off venlafaxine.  DISCHARGE EXAMINATION:  VITAL SIGNS:  Temperature was 97.8, blood pressure 162/84, pulse 77, respirations 18, oxygen saturations 100%. GENERAL APPEARANCE:  This is an easily awakened and overweight woman, lying in bed. HEENT:  Conjunctiva and sclerae were clear. PULMONARY:  The patient is moving air well with no rales, wheezes, or rhonchi. CARDIOVASCULAR:  2+ radial pulse.  Precordium was quiet.  She had a regular rate and rhythm. NEURO:  The patient is awake, her speech is clear.  She recognizes the examiner.  She understands her instructions.  DISCHARGE MEDICATIONS:  Will be the same as at home with the slow taper of venlafaxine and in the addition of Zyprexa.  FOLLOWUP:  The patient will be seen in the office in 7-10 days.  DISPOSITION:  The patient is stable for discharge to home with her family.  PATIENT'S CONDITION AT TIME OF DISCHARGE DICTATION:  Medically stable.  Dr. Debby Bud, thank you very much for assistance.     Rosalyn Gess Norins, MD     MEN/MEDQ  D:  06/24/2012  T:  06/25/2012  Job:  [id number]  cc:   Levert Feinstein, MD

## 2012-06-27 ENCOUNTER — Telehealth: Payer: Self-pay | Admitting: Internal Medicine

## 2012-06-27 NOTE — Telephone Encounter (Signed)
Spoke with daughter ,Martie Lee, given Dr. Debby Bud response to her question concerning Ativan. No further questions voiced.

## 2012-06-27 NOTE — Telephone Encounter (Signed)
Caller: [name]/Daughter in Social worker; PCP: Illene Regulus; CB#: [phone]; Call regarding Med Question;  Pt was prescribed Ativan 2.5 mg 1 HS 06/24/12.  She was reading and says info states that taking this med with Dementia could heighten suicidal tendencies and may increase the risk of death.  She would like to discuss this. Please call to advise.

## 2012-06-27 NOTE — Telephone Encounter (Signed)
1. Ativan should be use for agitation only 2. Risks are very low in a person who does not have suicidal ideation or intent.  May leave off medication if concerned.

## 2012-06-28 ENCOUNTER — Other Ambulatory Visit: Payer: Self-pay

## 2012-06-28 ENCOUNTER — Ambulatory Visit: Payer: Medicare Other | Admitting: Internal Medicine

## 2012-06-28 ENCOUNTER — Telehealth: Payer: Self-pay | Admitting: Internal Medicine

## 2012-06-28 DIAGNOSIS — Z0289 Encounter for other administrative examinations: Secondary | ICD-10-CM

## 2012-06-28 MED ORDER — INSULIN GLARGINE 100 UNIT/ML ~~LOC~~ SOLN: 40.0000 [IU] | Freq: Every day | SUBCUTANEOUS | Status: DC

## 2012-06-28 NOTE — Telephone Encounter (Signed)
Caller: [name]/Child; PCP: Illene Regulus; CB#: [phone]; Call regarding Lantis-Needs Refill but Pharmacy said that a Stop has been Put On It. SHE IS COMPLETELY OUT AND NEEDS REFILL TONIGHT-06/28/12. Seen in Office On 06/23/12 AND admitted to hospital for Emergency MRI- explained that meds get cancelled with admission and need to be reordered.  Uses [facility] [address]. Med: Lantis 100 U/ml.

## 2012-06-28 NOTE — Telephone Encounter (Signed)
Rx sent to pharmacy   

## 2012-06-30 ENCOUNTER — Telehealth: Payer: Self-pay | Admitting: Internal Medicine

## 2012-06-30 ENCOUNTER — Ambulatory Visit: Payer: Medicare HMO | Admitting: Internal Medicine

## 2012-06-30 ENCOUNTER — Telehealth: Payer: Self-pay

## 2012-06-30 ENCOUNTER — Other Ambulatory Visit: Payer: Self-pay | Admitting: Internal Medicine

## 2012-06-30 DIAGNOSIS — Z0289 Encounter for other administrative examinations: Secondary | ICD-10-CM

## 2012-06-30 NOTE — Telephone Encounter (Signed)
CY-please advise if okay to give Rx for Doxycycline-pt just had on 06-21-12 according to EPIC. Thanks.

## 2012-06-30 NOTE — Telephone Encounter (Signed)
Rx sent through my Rx in-basket; Left message for Martie Lee that this has been taken care of. If any questions or concerns to call the office.

## 2012-06-30 NOTE — Telephone Encounter (Signed)
Pt's daughter called requesting a refill on pt's Doxy ABX for productive cough. Daughter was advised that medication was originally Rxd by Pulmonary and she would need to contact that office. Daughter then proceeded to say that pt was still "itching" because she still thinks she has scabies infection. Daughter advised that pt has been having hallucinations and is demented and she this is probably the cause but she does not know what to do. I advised daughter that pt has appt today with MEN for breathing problems and she could discuss at visit. Daughter declined appt stating that there is nothing wrong with pt. Daughter advised that message would be sent to MEN for advisement on "itching".  She was then transferred to [facility] regarding Doxy Rx.

## 2012-06-30 NOTE — Telephone Encounter (Signed)
Today's office visit was for hospital follow-up and monitoring of new psychotropic medication - zyprexa. At hospital d/c she was to instructed to take hydroxyzine 50 mg tid.  Still needs to have hospital follow-up visit for medication monitoring.

## 2012-06-30 NOTE — Telephone Encounter (Signed)
Ok to repeat this time.

## 2012-07-01 NOTE — Telephone Encounter (Signed)
Left message on phone # of [name] of need to call office and get appt. With Dr. Debby [name] for followup appt. For hospital follow up and medicatioan for itching

## 2012-07-04 ENCOUNTER — Telehealth: Payer: Self-pay | Admitting: Internal Medicine

## 2012-07-06 ENCOUNTER — Telehealth: Payer: Self-pay | Admitting: Internal Medicine

## 2012-07-06 DIAGNOSIS — F341 Dysthymic disorder: Secondary | ICD-10-CM

## 2012-07-06 DIAGNOSIS — Z9181 History of falling: Secondary | ICD-10-CM

## 2012-07-06 DIAGNOSIS — E119 Type 2 diabetes mellitus without complications: Secondary | ICD-10-CM

## 2012-07-06 DIAGNOSIS — R269 Unspecified abnormalities of gait and mobility: Secondary | ICD-10-CM

## 2012-07-06 NOTE — Telephone Encounter (Signed)
OK for home health visits if not already in place after last hospitalization

## 2012-07-06 NOTE — Telephone Encounter (Signed)
Caller: [name]/Other; PCP: Illene Regulus; CB#: [phone]; ; ; Call regarding States That She Has Left Numerous Messages for Dr. Debby Bud To Call Her Back and No One Has Called Her Back As of Yet. States That [name] (mother in [address]) Was Hospitalized 06/24/12-06/26/12 for an Emergency MRI To Be Done. States That Pt Had [facility] Coming Out To See Her Twice Weekly As Well As Physical Therapy Coming Out Twice A. Week From There Too. Was Told That She Needed To Call Dr. Debby Bud Office for an Order for This To Resume.; Please Advise.

## 2012-07-07 NOTE — Telephone Encounter (Signed)
See phone note from MEN 08/07 @ 12:37 PM

## 2012-07-07 NOTE — Telephone Encounter (Signed)
Handled this message yesterday. It is ok to resume home health.

## 2012-07-11 ENCOUNTER — Other Ambulatory Visit: Payer: Self-pay | Admitting: *Deleted

## 2012-07-12 ENCOUNTER — Ambulatory Visit (INDEPENDENT_AMBULATORY_CARE_PROVIDER_SITE_OTHER): Payer: Medicare HMO | Admitting: Internal Medicine

## 2012-07-12 ENCOUNTER — Encounter: Payer: Self-pay | Admitting: Internal Medicine

## 2012-07-12 VITALS — BP 110/68 | HR 80 | Temp 97.2°F | Resp 16 | Wt 219.0 lb

## 2012-07-12 DIAGNOSIS — R443 Hallucinations, unspecified: Secondary | ICD-10-CM

## 2012-07-12 MED ORDER — OLANZAPINE 5 MG PO TABS: 5.0000 mg | ORAL_TABLET | Freq: Every day | ORAL | Status: DC

## 2012-07-12 MED ORDER — MEMANTINE HCL 10 MG PO TABS: 10.0000 mg | ORAL_TABLET | Freq: Two times a day (BID) | ORAL | Status: DC

## 2012-07-12 NOTE — Progress Notes (Signed)
Subjective:    Patient ID: [name], female    DOB: 18-Jul-1946, 66 y.o.   MRN: [id number]  HPI [name] presents for hospital follow up - she was at [facility] hospital July 24-26 for hallucinosis and mental status changes. During that stay she was seen by Dr. Ferol Luz for psychiatry: diagnosis was dementia with delusional disorder, home issues. She was started on zyprexa 2.5 mg at discharge and Namenda titration pak. In the interval she has been living at home with family members present with her. She continues to c/o feeling like she has a parasitic infection; everything foods and fluids have worms in it and she feels/sees organisms under skin on the back.  She has not been frankly agitated.   She does have right shoulder pain. She has some mild pedal edema.  She does complain of a vaginal discharge - yellow and thick and she reports seeing "bugs" in the discharge.   Past Medical History  Diagnosis Date  . Personal history of colonic polyps   . Iron deficiency anemia, unspecified   . Other malaise and fatigue   . Chest pain, unspecified   . Other B-complex deficiencies   . Arthritis     Right Sacroiliac joint  . Other and unspecified hyperlipidemia   . Anxiety and depression   . HTN (hypertension)   . Unspecified hypothyroidism   . Type II or unspecified type diabetes mellitus without mention of complication, not stated as uncontrolled   . Allergic rhinitis, cause unspecified   . Unspecified asthma   . Chronic airway obstruction, not elsewhere classified   . Abnormal stress test 08/21/2008    Neg  . GERD (gastroesophageal reflux disease)    Past Surgical History  Procedure Date  . Foot surgery     hammer toe 2004, 2005  . Abdominal hysterectomy 1982    menometorrhagia  . Carpal tunnel release 03/05/2009    bilateral   Family History  Problem Relation Age of Onset  . Hypertension Mother   . Diabetes Mother   . Heart disease Mother     CHF  . Coronary artery disease  Mother   . Lung cancer Father   . Hypertension Father   . Diabetes Father   . Liver cancer Father     w/mets  . Prostate cancer Father   . Breast cancer Sister   . Lung disease Brother     Agent Orange Related  . Asthma Brother   . Allergies Brother   . Colon cancer Neg Hx    History   Social History  . Marital Status: Legally Separated    Spouse Name: N/A    Number of Children: 6  . Years of Education: N/A   Occupational History  .     Social History Main Topics  . Smoking status: Former Smoker    Quit date: 06/01/1997  . Smokeless tobacco: Never Used   Comment: Quit 9 years ago.  . Alcohol Use: No  . Drug Use: No  . Sexually Active: Not on file   Other Topics Concern  . 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 Looking for work but can't find a jobEnvironment: House with crawl space, central air conditioning, hard wood. No feather bedding, no mold. Son smokes.  Pets including dogs, cats, 3 birds. Angioedema with Aspirin.Daily caffeine use two cups a dayRegular Exercise-yes    Current Outpatient Prescriptions on File Prior to Visit  Medication Sig Dispense Refill  . azelastine (ASTELIN) 137 MCG/SPRAY nasal spray Place 1 spray into the nose 2 (two) times daily. Use in each nostril as directed      . budesonide-formoterol (SYMBICORT) 80-4.5 MCG/ACT inhaler Inhale 2 puffs into the lungs 2 (two) times daily.  1 Inhaler  11  . clonazePAM (KLONOPIN) 1 MG tablet Take 0.5 tablets (0.5 mg total) by mouth at bedtime as needed. Anxiety  30 tablet  5  . dicyclomine (BENTYL) 10 MG capsule Take 10 mg by mouth 2 (two) times daily as needed. cramping and spasms.      Marland Kitchen doxycycline (VIBRAMYCIN) 100 MG capsule TAKE 2 CAPSULES TODAY THEN 1  CAPSULE BY MOUTH DAILY UNTIL GONE  8 capsule  0  . doxycycline (VIBRAMYCIN) 100 MG capsule TAKE 2 CAPSULES TODAY THEN 1 CAPSULE BY MOUTH DAILY UNTIL GONE  8 capsule  4  . esomeprazole (NEXIUM) 40 MG capsule Take 40 mg by mouth daily.       Marland Kitchen ezetimibe (ZETIA) 10 MG tablet Take 1 tablet (10 mg total) by mouth daily.  90 tablet  1  . ferrous sulfate 325 (65 FE) MG EC tablet Take 325 mg by mouth daily.       . furosemide (LASIX) 40 MG tablet Take 1 tablet (40 mg total) by mouth daily. For HTN.  30 tablet  5  . glucose blood (ACCU-CHEK AVIVA) test strip Use as directed  306 each  11  . insulin glargine (LANTUS) 100 UNIT/ML injection Inject 40 Units into the skin at bedtime.  20 mL  11  . levothyroxine (SYNTHROID) 88 MCG tablet Take 1 tablet (88 mcg total) by mouth daily.  90 tablet  3  . memantine (NAMENDA) 5 MG tablet Take 1 tablet (5 mg total) by mouth daily. Dispense titration pak- to take as directed  60 tablet  0  . metFORMIN (GLUCOPHAGE) 1000 MG tablet Take 1 tablet (1,000 mg total) by mouth 2 (two) times daily with a meal.  180 tablet  3  . OLANZapine (ZYPREXA) 2.5 MG tablet Take 1 tablet (2.5 mg total) by mouth at bedtime.  30 tablet  3  . sitaGLIPtin (JANUVIA) 100 MG tablet Take 1 tablet (100 mg total) by mouth daily.  30 tablet  11  . Syringe/Needle, Disp, 29G X 1/2" 1 ML MISC Use as directed with lantus  200 each  0  . tiotropium (SPIRIVA HANDIHALER) 18 MCG inhalation capsule Place 1 capsule (18 mcg total) into inhaler and inhale daily.  90 capsule  3  . valsartan (DIOVAN) 160 MG tablet Take 1 tablet (160 mg total) by mouth daily.  90 tablet  3  . DISCONTD: Calcium Carbonate-Vitamin D (CALCIUM 600/VITAMIN D) 600-400 MG-UNIT per tablet Take 1 tablet by mouth 2 (two) times daily.        Marland Kitchen DISCONTD: sodium chloride (OCEAN) 0.65 % nasal spray Place 2 sprays into the nose as needed. TO OPEN NASAL BREATHING          Review of Systems System review is negative for any constitutional, cardiac,  pulmonary, GI or neuro symptoms or complaints other than as described in the HPI.     Objective:   Physical Exam Filed Vitals:   07/12/12 1608  BP: 110/68  Pulse: 80  Temp: 97.2 F (36.2 C)  Resp:  16   Wt Readings from Last 3 Encounters:  07/12/12 219 lb (99.338 kg)  06/24/12 213 lb 3 oz (96.7 kg)  06/22/12 213 lb (96.616 kg)   Gen'l - overweight white woman in no distress. Verbalizes her problems with crawling bugs, worms and parasites. HEENT- C&S clear, PERRLA, EOMI, Fundi - normal disk margins, no abnormality on exam with handheld instrument. Neck - supple Chest - good breath sounds Cor- 2+ radial pulse, RRR, no JVD Neuro - no cogwheeling, no rigidity of movement.        Assessment & Plan:  Vaginal discharge - per patient report  Plan  To return for pelvic exam Thursday, August 15 at 1 PM

## 2012-07-12 NOTE — Patient Instructions (Addendum)
Problems with continued hallucinations - crawling things and parasitic infestation. Plan is to increase zyprexa to 5 mg daily; outpatient psychiatric consult - will arrange through our office; keep appointment with neurologist - Dr. [name].  Diabetes - continue your present medications and watch your diet. Will check an A1c in Nov.  Vaginal discharge - will need to arrange to see Dr. Jennette Kettle or to return her for pelvic exam Thursday at 1:00. No treatment at this time.   Blood pressure - very good control.  Dementia - complete the Namenda starter pak and a Rx is called in for maintenance dosing of 10 mg twice a day.  Shoulder pain - good range of motion on exam - no click or grinding. Plan - for pain it is ok to take generic aleve twice a day.  General physical exam: eye are normal on exam, lungs are clear, heart sounds normal, toes and feet are ok. For fluid build up - elevate legs during the day and consider using over the counter support hose.

## 2012-07-13 NOTE — Assessment & Plan Note (Signed)
Patient with continued delusion of infestation.  Plan  Increase zyprexa to 5 mg daily  No extra-pyramidal findings - will not start cogentin  EKG at next visit  Psychiatric consult

## 2012-07-14 ENCOUNTER — Ambulatory Visit: Payer: Medicare HMO | Admitting: Internal Medicine

## 2012-07-19 ENCOUNTER — Ambulatory Visit (INDEPENDENT_AMBULATORY_CARE_PROVIDER_SITE_OTHER): Payer: Medicaid Other | Admitting: Internal Medicine

## 2012-07-19 ENCOUNTER — Other Ambulatory Visit: Payer: Self-pay | Admitting: *Deleted

## 2012-07-19 ENCOUNTER — Ambulatory Visit (INDEPENDENT_AMBULATORY_CARE_PROVIDER_SITE_OTHER): Payer: Medicare HMO

## 2012-07-19 ENCOUNTER — Encounter: Payer: Self-pay | Admitting: Internal Medicine

## 2012-07-19 ENCOUNTER — Other Ambulatory Visit: Payer: Medicare HMO

## 2012-07-19 VITALS — BP 100/62 | HR 80 | Temp 97.8°F | Resp 16 | Wt 220.0 lb

## 2012-07-19 DIAGNOSIS — J309 Allergic rhinitis, unspecified: Secondary | ICD-10-CM

## 2012-07-19 DIAGNOSIS — N898 Other specified noninflammatory disorders of vagina: Secondary | ICD-10-CM

## 2012-07-19 DIAGNOSIS — N952 Postmenopausal atrophic vaginitis: Secondary | ICD-10-CM | POA: Insufficient documentation

## 2012-07-19 DIAGNOSIS — B86 Scabies: Secondary | ICD-10-CM

## 2012-07-19 LAB — WET PREP, GENITAL
Bacteria: NONE SEEN
Trich, Wet Prep: NONE SEEN

## 2012-07-19 MED ORDER — IVERMECTIN 3 MG PO TABS: 3.0000 mg | ORAL_TABLET | Freq: Once | ORAL | Status: DC

## 2012-07-19 MED ORDER — ESTROGENS, CONJUGATED 0.625 MG/GM VA CREA: TOPICAL_CREAM | Freq: Every day | VAGINAL | Status: DC

## 2012-07-19 MED ORDER — ACCU-CHEK FASTCLIX LANCETS MISC: 1.0000 | Status: DC

## 2012-07-19 NOTE — Progress Notes (Signed)
  Subjective:    Patient ID: [name], female    DOB: 04/14/1946, 66 y.o.   MRN: [id number]  HPI Presents for vaginal exam due to report of discharge and infestation.  Family has had scabies and the patient still itches. Family member reports they were treated successfully with ivermectin  PMH, FamHx and SocHx reviewed for any changes and relevance.    Review of Systems System review is negative for any constitutional, cardiac, pulmonary, GI or neuro symptoms or complaints other than as described in the HPI.     Objective:   Physical Exam Filed Vitals:   07/19/12 1321  BP: 100/62  Pulse: 80  Temp: 97.8 F (36.6 C)  Resp: 16   Gen'l- obese white woman in no distress HEENT- C&S clear Cor- RRR Pulm - normal respirations Derm - no lesions on UE or face Pelvic - NEG/BUS normal, vaginal canal clean, w/o discharge, thin dry mucosa       Assessment & Plan:  Scabies- outbreak among family members and a concern that patient has persistent pruritis secondary to incomplete treatment  Plan Ivermectin 18 g based on weight of 100 kg and dose of 200 mcg/kg

## 2012-07-19 NOTE — Patient Instructions (Addendum)
Pelvic exam - NO DISCHARGE OF ANY KIND! 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 This can cause a lot of itching and discomfort.  Plan - Premarin Cream - 1 vaginal applicator (1 gram) to the vagina daily x 10 days then twice a week.  For scabies infestation - ivermectin 200 mcg/kg prescribed.     Atrophic Vaginitis Atrophic vaginitis is a problem of low levels of estrogen in women. This problem can happen at any age. It is most common in women who have gone through menopause ("the change").   HOW WILL I KNOW IF I HAVE THIS PROBLEM? You may have:  Trouble with peeing (urinating), such as:   Going to the bathroom often.   A hard time holding your pee until you reach a bathroom.   Leaking pee.   Having pain when you pee.   Itching or a burning feeling.   Vaginal bleeding and spotting.   Pain during sex.   Dryness of the vagina.   A yellow, bad-smelling fluid (discharge) coming from the vagina.  HOW WILL MY DOCTOR CHECK FOR THIS PROBLEM?  During your exam, your doctor will likely find the problem.   If there is a vaginal fluid, it may be checked for infection.  HOW WILL THIS PROBLEM BE TREATED? Keep the vulvar skin as clean as possible. Moisturizers and lubricants can help with some of the symptoms. Estrogen replacement can help. There are 2 ways to take estrogen:  Systemic estrogen gets estrogen to your whole body. It takes many weeks or months before the symptoms get better.   You take an estrogen pill.   You use a skin patch. This is a patch that you put on your skin.   If you still have your uterus, your doctor may ask you to take a hormone. Talk to your doctor about the right medicine for you.   Estrogen cream.  This puts estrogen only at the part of your body where you apply it. The cream is put into the vagina or put on the vulvar skin. For some women, estrogen cream works faster than pills or the patch.  CAN ALL WOMEN WITH THIS PROBLEM USE  ESTROGEN? No. Women with certain types of cancer, liver problems, or problems with blood clots should not take estrogen. Your doctor can help you decide the best treatment for your symptoms. Document Released: 05/04/2008 Document Revised: 11/05/2011 Document Reviewed: 05/04/2008 [facility] Patient Information 2012 [place], [place].

## 2012-07-20 ENCOUNTER — Telehealth: Payer: Self-pay | Admitting: Internal Medicine

## 2012-07-20 NOTE — Telephone Encounter (Signed)
Caller: [name]/Other; Patient Name: [name]; PCP: Illene Regulus; Best Callback Phone Number: [phone].  Caller reports that pt was in to see Dr Debby Bud on 07/19/12. Pt is telling caller that Dr Debby Bud was going to put pt back on hydrocodone due to pain in arms and legs.  Caller states pt has been calling pharmacy repeatedly and that the last she knew Dr Debby Bud took pt off all medications that could possibly cause hallicinations.  Caller is wanting to verify if Dr Debby Bud was going to put pt back on Hydrocodone.  Per EMR documentation, there is not any notation of prescribing Hydrocodone.  Caller verbalized understanding.

## 2012-07-20 NOTE — Assessment & Plan Note (Signed)
Normal pelvic exam w/o any discharge. Appearance is c/w atrophic vaginitis which would be a cause of pruritis  Plan Premarin Vaginal cream 1 g daily x 10 then 1 g twice a week.

## 2012-07-21 ENCOUNTER — Other Ambulatory Visit: Payer: Self-pay | Admitting: *Deleted

## 2012-07-21 ENCOUNTER — Telehealth: Payer: Self-pay | Admitting: Internal Medicine

## 2012-07-21 MED ORDER — ESTROGENS, CONJUGATED 0.625 MG/GM VA CREA: TOPICAL_CREAM | Freq: Every day | VAGINAL | Status: DC

## 2012-07-21 MED ORDER — LEVOTHYROXINE SODIUM 88 MCG PO TABS: 88.0000 ug | ORAL_TABLET | Freq: Every day | ORAL | Status: DC

## 2012-07-21 MED ORDER — GLUCOSE BLOOD VI STRP: ORAL_STRIP | Status: DC

## 2012-07-21 NOTE — Telephone Encounter (Signed)
Spoke with patient daughter Martie Lee, order per Dr. Debby Bud it is ok to give extra lasix

## 2012-07-21 NOTE — Telephone Encounter (Signed)
Ok to take extra lasix.

## 2012-07-21 NOTE — Telephone Encounter (Signed)
Caller: [name]/Other; Patient Name: [name]; PCP: Illene Regulus; Best Callback Phone Number: [phone] Has had swelling in both legs for  2 weeks, Right leg worse.  Swelling was bad on Tues 8/20 so saw Dr Arthur Holms in the office.  Had noticed numbness in both feet yesterday 8/21 and now slight tingle.  Swelling not down in the mornings after legs are up.  Takes Lasix 40mg  and asking care/giver asking if she can give an extra one today, patient told her that she takes an extra one at times.  Triaged in Edema Guideline - Disposition:  See Provider Within 24 Hours due to swelling of legs that does not resolve with rest and elevation of legs.  Declines office appointment due to lack of transportation currently.  Asking for direction for Lasix.   Please review and contact [name] at [phone].

## 2012-07-25 ENCOUNTER — Telehealth: Payer: Self-pay | Admitting: Internal Medicine

## 2012-07-25 ENCOUNTER — Telehealth: Payer: Self-pay | Admitting: *Deleted

## 2012-07-25 NOTE — Telephone Encounter (Signed)
Patient just had a pelvic exam - atrophic vaginitis. No new meds. Continue with topical premarin.

## 2012-07-25 NOTE — Telephone Encounter (Signed)
Pt is having occasional vaginal bleeding.  She was told to let Dr. Debby Bud know.

## 2012-07-25 NOTE — Telephone Encounter (Signed)
Returned call to daughter cell # [phone], no answer and no VM to leave message. Will try again

## 2012-07-25 NOTE — Telephone Encounter (Signed)
Patient notified of was to be expected to see some vaginal bleeding due to the  Pelvic exam she had last Friday. To be sure and continue with her vaginal premarin cream

## 2012-07-26 ENCOUNTER — Telehealth: Payer: Self-pay | Admitting: *Deleted

## 2012-07-26 ENCOUNTER — Telehealth: Payer: Self-pay | Admitting: Internal Medicine

## 2012-07-26 ENCOUNTER — Other Ambulatory Visit: Payer: Self-pay | Admitting: *Deleted

## 2012-07-26 MED ORDER — FUROSEMIDE 40 MG PO TABS: 40.0000 mg | ORAL_TABLET | Freq: Every day | ORAL | Status: DC

## 2012-07-26 NOTE — Telephone Encounter (Signed)
Caller: [name]/Daughter; Patient Name: [name]; PCP: Illene Regulus (Adults only); Best Callback Phone Number: [phone] Recently Dx with Dementia- seen last week. Calling to check which medication [name] takes to lower Cholesterol. Mom insists that it is Welchol but has Zetia. Meds checked via EPIC and confirmed that she is currently taking Zetia. Medication Questions Protocol.

## 2012-07-26 NOTE — Telephone Encounter (Signed)
Message copied by Elnora Morrison on Tue Jul 26, 2012 10:08 AM ------      Message from: Jacques Navy      Created: Sun Jul 24, 2012  6:22 PM       Call patient if not already notified of normal wet prep.

## 2012-07-26 NOTE — Telephone Encounter (Signed)
Message copied by Elnora Morrison on Tue Jul 26, 2012 10:05 AM ------      Message from: Jacques Navy      Created: Sun Jul 24, 2012  6:22 PM       Call patient if not already notified of normal wet prep.

## 2012-07-26 NOTE — Telephone Encounter (Signed)
LEFT MESSAGE WITH DAUGHTER IN LAW [name] THAT TEST RESULTS NORMAL . PATIENT HOME # UNABLE TO LEAVE MESSAGE

## 2012-08-02 ENCOUNTER — Ambulatory Visit (INDEPENDENT_AMBULATORY_CARE_PROVIDER_SITE_OTHER): Payer: Medicare HMO

## 2012-08-02 ENCOUNTER — Ambulatory Visit (INDEPENDENT_AMBULATORY_CARE_PROVIDER_SITE_OTHER): Payer: Medicare HMO | Admitting: Internal Medicine

## 2012-08-02 ENCOUNTER — Encounter: Payer: Self-pay | Admitting: Internal Medicine

## 2012-08-02 VITALS — BP 110/80 | HR 98 | Temp 97.4°F | Resp 16 | Wt 213.0 lb

## 2012-08-02 DIAGNOSIS — R42 Dizziness and giddiness: Secondary | ICD-10-CM

## 2012-08-02 DIAGNOSIS — J309 Allergic rhinitis, unspecified: Secondary | ICD-10-CM

## 2012-08-02 DIAGNOSIS — E538 Deficiency of other specified B group vitamins: Secondary | ICD-10-CM

## 2012-08-02 DIAGNOSIS — E119 Type 2 diabetes mellitus without complications: Secondary | ICD-10-CM

## 2012-08-02 MED ORDER — CYANOCOBALAMIN 1000 MCG/ML IJ SOLN
1000.0000 ug | Freq: Once | INTRAMUSCULAR | Status: AC
  Administered 2012-08-02: 1000 ug via INTRAMUSCULAR

## 2012-08-02 MED ORDER — MECLIZINE HCL 12.5 MG PO TABS: 12.5000 mg | ORAL_TABLET | Freq: Three times a day (TID) | ORAL | Status: DC | PRN

## 2012-08-02 NOTE — Progress Notes (Signed)
Subjective:     Patient ID: [name], female   DOB: August 06, 1946, 66 y.o.   MRN: [id number]  HPI Comments: [name] is a 66 yo female with a history of Alzheimer's type dementia who has presented to the clinic today for dizziness. The patient reports that she does not experience the room spinning, but does feel lightheaded during these dizziness spells. She also denies weakness in her legs. The dizziness spells occur when she is sitting or standing, but get worse when she is standing or walking. She denies fever, nausea, or vomiting. She claims she used to have frequent falls from this in the past, but she has been falling less frequently in the last month.  She also expressed concerns about her blood glucose control. She claims her blood sugar readings in the morning are around 200 despite adhering to her medication and insulin regimen. [name] expressed interest in an insulin pump, and I explained to her that an insulin pump is not appropriate for her current insulin regimen.  Past Medical History  Diagnosis Date  . Personal history of colonic polyps   . Iron deficiency anemia, unspecified   . Other malaise and fatigue   . Chest pain, unspecified   . Other B-complex deficiencies   . Arthritis     Right Sacroiliac joint  . Other and unspecified hyperlipidemia   . Anxiety and depression   . HTN (hypertension)   . Unspecified hypothyroidism   . Type II or unspecified type diabetes mellitus without mention of complication, not stated as uncontrolled   . Allergic rhinitis, cause unspecified   . Unspecified asthma   . Chronic airway obstruction, not elsewhere classified   . Abnormal stress test 08/21/2008    Neg  . GERD (gastroesophageal reflux disease)    Past Surgical History  Procedure Date  . Foot surgery     hammer toe 2004, 2005  . Abdominal hysterectomy 1982    menometorrhagia  . Carpal tunnel release 03/05/2009    bilateral   Family History  Problem Relation Age  of Onset  . Hypertension Mother   . Diabetes Mother   . Heart disease Mother     CHF  . Coronary artery disease Mother   . Lung cancer Father   . Hypertension Father   . Diabetes Father   . Liver cancer Father     w/mets  . Prostate cancer Father   . Breast cancer Sister   . Lung disease Brother     Agent Orange Related  . Asthma Brother   . Allergies Brother   . Colon cancer Neg Hx    History   Social History  . Marital Status: Legally Separated    Spouse Name: N/A    Number of Children: 6  . Years of Education: N/A   Occupational History  .     Social History Main Topics  . Smoking status: Former Smoker    Quit date: 06/01/1997  . Smokeless tobacco: Never Used   Comment: Quit 9 years ago.  . Alcohol Use: No  . Drug Use: No  . Sexually Active: Not on file   Other Topics Concern  . Not on file   Social History Narrative   10th gradeMarried - 1965, Divorced after 5 years; married 1971-divorced 2 yrs; married [id number] sons - '68, '71, '79; 3 daughters - '69, '71, '79Grandchildren 10; 2 great-grandchildrenDisability - was a CNA, disability ended with Medicare/retirement. Looking for work but can't find a jobEnvironment: [employer]  with crawl space, central air conditioning, hard wood. No feather bedding, no mold. Son smokes. Pets including dogs, cats, 3 birds. Angioedema with Aspirin.Daily caffeine use two cups a dayRegular Exercise-yes   Current Outpatient Prescriptions on File Prior to Visit  Medication Sig Dispense Refill  . ACCU-CHEK FASTCLIX LANCETS MISC 1 each by Does not apply route as directed.  102 each  3  . azelastine (ASTELIN) 137 MCG/SPRAY nasal spray Place 1 spray into the nose 2 (two) times daily. Use in each nostril as directed      . budesonide-formoterol (SYMBICORT) 80-4.5 MCG/ACT inhaler Inhale 2 puffs into the lungs 2 (two) times daily.  1 Inhaler  11  . clonazePAM (KLONOPIN) 1 MG tablet Take 0.5 tablets (0.5 mg total) by mouth at bedtime as needed. Anxiety   30 tablet  5  . conjugated estrogens (PREMARIN) vaginal cream Place vaginally daily. Use daily x 10 days then twice a week.  42.5 g  12  . dicyclomine (BENTYL) 10 MG capsule Take 10 mg by mouth 2 (two) times daily as needed. cramping and spasms.      Marland Kitchen esomeprazole (NEXIUM) 40 MG capsule Take 40 mg by mouth daily.       Marland Kitchen ezetimibe (ZETIA) 10 MG tablet Take 1 tablet (10 mg total) by mouth daily.  90 tablet  1  . ferrous sulfate 325 (65 FE) MG EC tablet Take 325 mg by mouth daily.       . furosemide (LASIX) 40 MG tablet Take 1 tablet (40 mg total) by mouth daily. For HTN.  30 tablet  5  . glucose blood (ACCU-CHEK AVIVA) test strip Use as directed  100 each  11  . insulin glargine (LANTUS) 100 UNIT/ML injection Inject 40 Units into the skin at bedtime.  20 mL  11  . ivermectin (STROMECTOL) 3 MG TABS Take 1 tablet (3 mg total) by mouth once. Take 18 mg (227mcg/kg, 100 kg) x 1, repeat in 2 weeks.  6 tablet  1  . levothyroxine (SYNTHROID) 88 MCG tablet Take 1 tablet (88 mcg total) by mouth daily.  90 tablet  3  . memantine (NAMENDA) 10 MG tablet Take 1 tablet (10 mg total) by mouth 2 (two) times daily.  60 tablet  11  . metFORMIN (GLUCOPHAGE) 1000 MG tablet Take 1 tablet (1,000 mg total) by mouth 2 (two) times daily with a meal.  180 tablet  3  . OLANZapine (ZYPREXA) 5 MG tablet Take 1 tablet (5 mg total) by mouth at bedtime.  30 tablet  3  . sitaGLIPtin (JANUVIA) 100 MG tablet Take 1 tablet (100 mg total) by mouth daily.  30 tablet  11  . Syringe/Needle, Disp, 29G X 1/2" 1 ML MISC Use as directed with lantus  200 each  0  . tiotropium (SPIRIVA HANDIHALER) 18 MCG inhalation capsule Place 1 capsule (18 mcg total) into inhaler and inhale daily.  90 capsule  3  . valsartan (DIOVAN) 160 MG tablet Take 1 tablet (160 mg total) by mouth daily.  90 tablet  3  . doxycycline (VIBRAMYCIN) 100 MG capsule TAKE 2 CAPSULES TODAY THEN 1 CAPSULE BY MOUTH DAILY UNTIL GONE  8 capsule  0  . doxycycline (VIBRAMYCIN) 100 MG  capsule TAKE 2 CAPSULES TODAY THEN 1 CAPSULE BY MOUTH DAILY UNTIL GONE  8 capsule  4  . memantine (NAMENDA) 5 MG tablet Take 1 tablet (5 mg total) by mouth daily. Dispense titration pak- to take as directed  60 tablet  0  . DISCONTD: Calcium Carbonate-Vitamin D (CALCIUM 600/VITAMIN D) 600-400 MG-UNIT per tablet Take 1 tablet by mouth 2 (two) times daily.        Marland Kitchen DISCONTD: sodium chloride (OCEAN) 0.65 % nasal spray Place 2 sprays into the nose as needed. TO OPEN NASAL BREATHING        Review of Systems  All other systems reviewed and are negative.       Objective:   Physical Exam  Constitutional: She is oriented to person, place, and time. No distress.  HENT:  Head: Atraumatic.  Pulmonary/Chest: Effort normal.  Musculoskeletal:       The patient's toes are everted, and the arches of both feet appear to be flattened. No ulcers or lesions were seen on her feet. Sensation to vibration and pinprick present in both feet bl.  Neurological: She is alert and oriented to person, place, and time.       CN 2-12 intact. PT was able to perform rapid alternating movements and heel to shin test. Negative romberg test. PT had difficulty balancing with tandem gait, but minimal difficulty balancing with normal gait.  Skin: She is not diaphoretic.  Psychiatric: She has a normal mood and affect. Her behavior is normal.       Assessment & Plan:     1. Dizziness - likely due to a combination of chronic labyrinthitis and deformity in her feet.  Plan -  2. Poorly Controlled DM2 - patient's morning sugars run around 200.  Plan - increase Lantus insulin and advise patient to monitor blood sugars at home.     Attending note: patient interviewed and examined. Her symptoms are chronic and compatible with labyrinthitis. She has had MRI brain in the recent past that was negative.  Plan - meclizine for dizziness  For diabetes management she and her family are instructed in how to continue to titrate her  insulin doses.

## 2012-08-02 NOTE — Patient Instructions (Addendum)
Dizziness- more of a problem with balance than true vertigo. MRI brain in July was normal - no stroke, no tumore. Exam today with no evidence of stroke of Central nervous system problem. Suspect this is labyrinthitis - chronic, inner ear.  Plan - meclizine 12.5 mg twice a day routinely.  Diabetes - last A1C 8.2 % If your fasting AM blood sugar is greater than 150 you need to increase the lantus by 5 units - go to 45 units. If over the next 4 days the blood sugar continues to be greater than 150 increase the lantus again to 50 units. If over the next 4 days the blood sugar, AM fasting, is greater than 150 increase the lantus to 55 units.   Continue all your other medications.

## 2012-08-04 ENCOUNTER — Telehealth: Payer: Self-pay | Admitting: Internal Medicine

## 2012-08-04 NOTE — Telephone Encounter (Signed)
Signed forms for supplies this AM

## 2012-08-04 NOTE — Telephone Encounter (Signed)
Caller: [name]/Daughter-n-law; Patient Name: [name]; PCP: Illene Regulus (Adults only); Best Callback Phone Number: [phone].  Call regarding Feet and Leg pain, Patient requesting to be placed back on Hydrocodone.  Acetaminophen and Ibuprofen doesn't help pain.  Patient not with Caller for assessment.   Patient was recently seen on 9-3.   Contacted Patient directly at [phone]. Per Patient: Right Foot and Leg swellen, worse than evaluated on 9-3 by Dr Debby Bud, and new onset of bi-lat Foot Pain.  Patient can only take few steps at a time, then has sit down.  Patient states, since I stopped taking Hydrocodene my Pain has slowly returned.  All emergent symptoms ruled out per Edema Protocol, see in 24 hours, due to Edema worse than usual.  Appointment scheduled on 9-6 at 0915 with Dr Clent Ridges.  Patient would like to know if Hydrocodone could be called in today, 9-5, no same day appointments.  Patient uses [facility], [place], [phone].

## 2012-08-04 NOTE — Telephone Encounter (Signed)
Left msg on triage stating [facility] has been trying to get in contact with md. They have faxed over order so she can get her diapers & supplies. Requesting order to be fax back...Raechel Chute

## 2012-08-05 ENCOUNTER — Telehealth: Payer: Self-pay | Admitting: Internal Medicine

## 2012-08-05 ENCOUNTER — Ambulatory Visit: Payer: Medicare HMO | Admitting: Endocrinology

## 2012-08-05 MED ORDER — GLUCOSE BLOOD VI STRP: ORAL_STRIP | Status: DC

## 2012-08-05 NOTE — Telephone Encounter (Signed)
Caller: Icie/Patient; Phone: [phone]; Reason for Call: Caller: Bettyjean/Patient; Patient Name: [name]; PCP: Illene Regulus (Adults only); Best Callback Phone Number: [phone];   Patient calling.  Patient states she received a new glucose meter.  States she now has the Contour Next Glucose Meter.  Patient states she needs testing strips for the meter.  Patient states she tests her glucose 4 times a day.  States she has one testing strip remaining.   Patient uses [facility] on [address] at [phone].  Patient can be reached at [phone].   PATIENT NEEDS GLUCOSE TESTING STRIPS FOR CONTOUR NEXT GLUCOSE METER.  ORDER TO BE CALLED INTO [facility] [address] AT [phone].

## 2012-08-06 ENCOUNTER — Other Ambulatory Visit: Payer: Self-pay | Admitting: Nurse Practitioner

## 2012-08-07 NOTE — Assessment & Plan Note (Signed)
Persistent chronic intermittent labyrinthitis.  Plan - continue to use meclizine for symptoms management

## 2012-08-07 NOTE — Assessment & Plan Note (Signed)
Lab Results  Component Value Date   HGBA1C 8.2* 06/22/2012   Patient and her family instructed in how to titrate her basal insulin therapy.

## 2012-08-09 ENCOUNTER — Telehealth: Payer: Self-pay | Admitting: Internal Medicine

## 2012-08-09 NOTE — Telephone Encounter (Signed)
Caller: Martie Lee is calling with a question about Rx for strips for glucose meter.  States called office 08/04/12 and again 08/05/12 requesting strips to use for her new glucose meter, the Contour Next meter.  States has not been able to get [facility] to fill renewal request without a fax from the office.  Per Epic, Rx was called in 08/05/12 for Contour strips, #100, RF x 12.  TC to [facility]/[address] [phone]; Order given to pharmacist as in Epic for [employer] Next test strips, test QID as directed, #100, RF x 12.

## 2012-08-10 ENCOUNTER — Telehealth: Payer: Self-pay | Admitting: Internal Medicine

## 2012-08-10 NOTE — Telephone Encounter (Signed)
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 No need for urgent visit.

## 2012-08-10 NOTE — Telephone Encounter (Signed)
Patient Name: [name]; PCP: Illene Regulus (Adults only); Best Callback Phone Number: [phone]; Patient reports onset ~ 1 month ago, given presciption for scabies at office visit 07/19/12: ivermectin (STROMECTOL) 3 MG TABS, took  repeat dosage 08/03/12, continues to see "pinworms" with itching also. Guideline: rectal symptoms. Disposition: See within 2 weeks, please call patient with recommendations for continued rectal itching.

## 2012-08-11 ENCOUNTER — Encounter (HOSPITAL_COMMUNITY): Payer: Self-pay

## 2012-08-11 ENCOUNTER — Observation Stay (HOSPITAL_COMMUNITY)
Admission: EM | Admit: 2012-08-11 | Discharge: 2012-08-13 | Disposition: A | Discharge status: Home / Self Care | Payer: Medicare HMO | Attending: Internal Medicine | Admitting: Internal Medicine

## 2012-08-11 ENCOUNTER — Emergency Department (HOSPITAL_COMMUNITY): Payer: Medicare HMO

## 2012-08-11 DIAGNOSIS — E785 Hyperlipidemia, unspecified: Secondary | ICD-10-CM | POA: Diagnosis present

## 2012-08-11 DIAGNOSIS — R413 Other amnesia: Secondary | ICD-10-CM | POA: Diagnosis present

## 2012-08-11 DIAGNOSIS — E669 Obesity, unspecified: Secondary | ICD-10-CM | POA: Insufficient documentation

## 2012-08-11 DIAGNOSIS — E039 Hypothyroidism, unspecified: Secondary | ICD-10-CM | POA: Diagnosis present

## 2012-08-11 DIAGNOSIS — G309 Alzheimer's disease, unspecified: Secondary | ICD-10-CM | POA: Insufficient documentation

## 2012-08-11 DIAGNOSIS — D509 Iron deficiency anemia, unspecified: Secondary | ICD-10-CM | POA: Diagnosis present

## 2012-08-11 DIAGNOSIS — F17200 Nicotine dependence, unspecified, uncomplicated: Secondary | ICD-10-CM | POA: Insufficient documentation

## 2012-08-11 DIAGNOSIS — F068 Other specified mental disorders due to known physiological condition: Secondary | ICD-10-CM

## 2012-08-11 DIAGNOSIS — F0281 Dementia in other diseases classified elsewhere with behavioral disturbance: Secondary | ICD-10-CM

## 2012-08-11 DIAGNOSIS — E1129 Type 2 diabetes mellitus with other diabetic kidney complication: Secondary | ICD-10-CM | POA: Diagnosis present

## 2012-08-11 DIAGNOSIS — F0282 Dementia in other diseases classified elsewhere, unspecified severity, with psychotic disturbance: Secondary | ICD-10-CM | POA: Diagnosis present

## 2012-08-11 DIAGNOSIS — J449 Chronic obstructive pulmonary disease, unspecified: Secondary | ICD-10-CM | POA: Insufficient documentation

## 2012-08-11 DIAGNOSIS — R079 Chest pain, unspecified: Principal | ICD-10-CM | POA: Diagnosis present

## 2012-08-11 DIAGNOSIS — I10 Essential (primary) hypertension: Secondary | ICD-10-CM | POA: Diagnosis present

## 2012-08-11 DIAGNOSIS — E538 Deficiency of other specified B group vitamins: Secondary | ICD-10-CM | POA: Diagnosis present

## 2012-08-11 DIAGNOSIS — F0280 Dementia in other diseases classified elsewhere without behavioral disturbance: Secondary | ICD-10-CM | POA: Insufficient documentation

## 2012-08-11 DIAGNOSIS — IMO0002 Reserved for concepts with insufficient information to code with codable children: Secondary | ICD-10-CM | POA: Diagnosis present

## 2012-08-11 DIAGNOSIS — E119 Type 2 diabetes mellitus without complications: Secondary | ICD-10-CM | POA: Insufficient documentation

## 2012-08-11 DIAGNOSIS — F341 Dysthymic disorder: Secondary | ICD-10-CM | POA: Diagnosis present

## 2012-08-11 DIAGNOSIS — J4489 Other specified chronic obstructive pulmonary disease: Secondary | ICD-10-CM | POA: Diagnosis present

## 2012-08-11 LAB — CBC
HCT: 34.3 % — ABNORMAL LOW (ref 36.0–46.0)
MCHC: 33.5 g/dL (ref 30.0–36.0)
MCV: 89.1 fL (ref 78.0–100.0)
Platelets: 128 10*3/uL — ABNORMAL LOW (ref 150–400)
RDW: 13.9 % (ref 11.5–15.5)

## 2012-08-11 LAB — GLUCOSE, CAPILLARY: Glucose-Capillary: 146 mg/dL — ABNORMAL HIGH (ref 70–99)

## 2012-08-11 LAB — BASIC METABOLIC PANEL
BUN: 23 mg/dL (ref 6–23)
Creatinine, Ser: 1.07 mg/dL (ref 0.50–1.10)
GFR calc Af Amer: 60 mL/min — ABNORMAL LOW (ref >90)
GFR calc non Af Amer: 52 mL/min — ABNORMAL LOW (ref >90)

## 2012-08-11 MED ORDER — FUROSEMIDE 40 MG PO TABS
40.0000 mg | ORAL_TABLET | Freq: Every day | ORAL | Status: DC
  Administered 2012-08-12 – 2012-08-13 (×2): 40 mg via ORAL
  Filled 2012-08-11 (×2): qty 1

## 2012-08-11 MED ORDER — SODIUM CHLORIDE 0.9 % IV BOLUS (SEPSIS)
1000.0000 mL | Freq: Once | INTRAVENOUS | Status: AC
  Administered 2012-08-11: 1000 mL via INTRAVENOUS

## 2012-08-11 MED ORDER — BUDESONIDE-FORMOTEROL FUMARATE 80-4.5 MCG/ACT IN AERO
2.0000 | INHALATION_SPRAY | Freq: Two times a day (BID) | RESPIRATORY_TRACT | Status: DC
  Administered 2012-08-12 – 2012-08-13 (×3): 2 via RESPIRATORY_TRACT
  Filled 2012-08-11: qty 6.9

## 2012-08-11 MED ORDER — FERROUS SULFATE 325 (65 FE) MG PO TABS
325.0000 mg | ORAL_TABLET | Freq: Every day | ORAL | Status: DC
  Administered 2012-08-12 – 2012-08-13 (×2): 325 mg via ORAL
  Filled 2012-08-11 (×2): qty 1

## 2012-08-11 MED ORDER — ACETAMINOPHEN 325 MG PO TABS
650.0000 mg | ORAL_TABLET | Freq: Four times a day (QID) | ORAL | Status: DC | PRN
  Administered 2012-08-13: 650 mg via ORAL
  Filled 2012-08-11: qty 2

## 2012-08-11 MED ORDER — GUAIFENESIN-DM 100-10 MG/5ML PO SYRP: 5.0000 mL | ORAL_SOLUTION | ORAL | Status: DC | PRN

## 2012-08-11 MED ORDER — MEMANTINE HCL 10 MG PO TABS
10.0000 mg | ORAL_TABLET | Freq: Two times a day (BID) | ORAL | Status: DC
  Administered 2012-08-11 – 2012-08-13 (×4): 10 mg via ORAL
  Filled 2012-08-11 (×5): qty 1

## 2012-08-11 MED ORDER — ACETAMINOPHEN 650 MG RE SUPP: 650.0000 mg | Freq: Four times a day (QID) | RECTAL | Status: DC | PRN

## 2012-08-11 MED ORDER — INSULIN ASPART 100 UNIT/ML ~~LOC~~ SOLN
0.0000 [IU] | Freq: Three times a day (TID) | SUBCUTANEOUS | Status: DC
  Administered 2012-08-12: 1 [IU] via SUBCUTANEOUS
  Administered 2012-08-12 (×2): 2 [IU] via SUBCUTANEOUS
  Administered 2012-08-13: 5 [IU] via SUBCUTANEOUS

## 2012-08-11 MED ORDER — INSULIN GLARGINE 100 UNIT/ML ~~LOC~~ SOLN
50.0000 [IU] | Freq: Every day | SUBCUTANEOUS | Status: DC
  Administered 2012-08-11: 50 [IU] via SUBCUTANEOUS

## 2012-08-11 MED ORDER — LEVOTHYROXINE SODIUM 88 MCG PO TABS
88.0000 ug | ORAL_TABLET | Freq: Every day | ORAL | Status: DC
  Administered 2012-08-12 – 2012-08-13 (×2): 88 ug via ORAL
  Filled 2012-08-11 (×2): qty 1

## 2012-08-11 MED ORDER — EZETIMIBE 10 MG PO TABS
10.0000 mg | ORAL_TABLET | Freq: Every day | ORAL | Status: DC
  Administered 2012-08-12 – 2012-08-13 (×2): 10 mg via ORAL
  Filled 2012-08-11 (×2): qty 1

## 2012-08-11 MED ORDER — LINAGLIPTIN 5 MG PO TABS
5.0000 mg | ORAL_TABLET | Freq: Every day | ORAL | Status: DC
  Administered 2012-08-12 – 2012-08-13 (×2): 5 mg via ORAL
  Filled 2012-08-11 (×2): qty 1

## 2012-08-11 MED ORDER — OMEGA-3-ACID ETHYL ESTERS 1 G PO CAPS
1.0000 g | ORAL_CAPSULE | Freq: Two times a day (BID) | ORAL | Status: DC
  Administered 2012-08-12 – 2012-08-13 (×3): 1 g via ORAL
  Filled 2012-08-11 (×4): qty 1

## 2012-08-11 MED ORDER — ENOXAPARIN SODIUM 40 MG/0.4ML ~~LOC~~ SOLN
40.0000 mg | SUBCUTANEOUS | Status: DC
  Administered 2012-08-11 – 2012-08-12 (×2): 40 mg via SUBCUTANEOUS
  Filled 2012-08-11 (×3): qty 0.4

## 2012-08-11 MED ORDER — METFORMIN HCL 500 MG PO TABS
1000.0000 mg | ORAL_TABLET | Freq: Two times a day (BID) | ORAL | Status: DC
  Administered 2012-08-12 (×2): 1000 mg via ORAL
  Filled 2012-08-11 (×5): qty 2

## 2012-08-11 MED ORDER — PANTOPRAZOLE SODIUM 40 MG PO TBEC
40.0000 mg | DELAYED_RELEASE_TABLET | Freq: Every day | ORAL | Status: DC
  Administered 2012-08-12 – 2012-08-13 (×2): 40 mg via ORAL
  Filled 2012-08-11 (×2): qty 1

## 2012-08-11 MED ORDER — OLANZAPINE 5 MG PO TABS
5.0000 mg | ORAL_TABLET | Freq: Every day | ORAL | Status: DC
  Administered 2012-08-11 – 2012-08-12 (×2): 5 mg via ORAL
  Filled 2012-08-11 (×3): qty 1

## 2012-08-11 MED ORDER — TIOTROPIUM BROMIDE MONOHYDRATE 18 MCG IN CAPS
18.0000 ug | ORAL_CAPSULE | Freq: Every day | RESPIRATORY_TRACT | Status: DC
  Administered 2012-08-12 – 2012-08-13 (×2): 18 ug via RESPIRATORY_TRACT
  Filled 2012-08-11: qty 5

## 2012-08-11 MED ORDER — CLONAZEPAM 0.5 MG PO TABS
0.5000 mg | ORAL_TABLET | Freq: Every day | ORAL | Status: DC
  Administered 2012-08-11 – 2012-08-12 (×2): 0.5 mg via ORAL
  Filled 2012-08-11 (×2): qty 1

## 2012-08-11 MED ORDER — AZELASTINE HCL 0.1 % NA SOLN
1.0000 | Freq: Two times a day (BID) | NASAL | Status: DC
  Administered 2012-08-11 – 2012-08-13 (×4): 1 via NASAL
  Filled 2012-08-11 (×2): qty 30

## 2012-08-11 MED ORDER — SODIUM CHLORIDE 0.9 % IJ SOLN
3.0000 mL | Freq: Two times a day (BID) | INTRAMUSCULAR | Status: DC
  Administered 2012-08-11 – 2012-08-13 (×4): 3 mL via INTRAVENOUS

## 2012-08-11 MED ORDER — IRBESARTAN 75 MG PO TABS
75.0000 mg | ORAL_TABLET | Freq: Every day | ORAL | Status: DC
  Administered 2012-08-12 – 2012-08-13 (×2): 75 mg via ORAL
  Filled 2012-08-11 (×2): qty 1

## 2012-08-11 NOTE — ED Notes (Signed)
Urine sample has been provide if needed.

## 2012-08-11 NOTE — ED Provider Notes (Signed)
History     CSN: [id number]  Arrival date & time 08/11/12  1551   First MD Initiated Contact with Patient 08/11/12 1635      Chief Complaint  Patient presents with  . Chest Pain    (Consider location/radiation/quality/duration/timing/severity/associated sxs/prior treatment) Patient is a 66 y.o. female presenting with chest pain. The history is provided by the patient and medical records. No language interpreter was used.  Chest Pain The chest pain began 1 - 2 hours ago. Chest pain occurs constantly. The chest pain is resolved. The pain is associated with exertion. At its most intense, the pain is at 8/10. The pain is currently at 0/10. The severity of the pain is severe. The quality of the pain is described as dull. The pain radiates to the left jaw and right jaw. Primary symptoms include shortness of breath. Pertinent negatives for primary symptoms include no fever.  The shortness of breath began today. The shortness of breath developed suddenly. The shortness of breath is moderate. The patient's medical history is significant for COPD.  Pertinent negatives for associated symptoms include no diaphoresis and no lower extremity edema. She tried nitroglycerin for the symptoms. Risk factors include being elderly, obesity, post-menopausal, sedentary lifestyle and smoking/tobacco exposure.  Her past medical history is significant for COPD, diabetes and hypertension.     Past Medical History  Diagnosis Date  . Personal history of colonic polyps   . Iron deficiency anemia, unspecified   . Other malaise and fatigue   . Chest pain, unspecified   . Other B-complex deficiencies   . Arthritis     Right Sacroiliac joint  . Other and unspecified hyperlipidemia   . Anxiety and depression   . HTN (hypertension)   . Unspecified hypothyroidism   . Type II or unspecified type diabetes mellitus without mention of complication, not stated as uncontrolled   . Allergic rhinitis, cause unspecified   .  Unspecified asthma   . Chronic airway obstruction, not elsewhere classified   . Abnormal stress test 08/21/2008    Neg  . GERD (gastroesophageal reflux disease)     Past Surgical History  Procedure Date  . Foot surgery     hammer toe 2004, 2005  . Abdominal hysterectomy 1982    menometorrhagia  . Carpal tunnel release 03/05/2009    bilateral    Family History  Problem Relation Age of Onset  . Hypertension Mother   . Diabetes Mother   . Heart disease Mother     CHF  . Coronary artery disease Mother   . Lung cancer Father   . Hypertension Father   . Diabetes Father   . Liver cancer Father     w/mets  . Prostate cancer Father   . Breast cancer Sister   . Lung disease Brother     Agent Orange Related  . Asthma Brother   . Allergies Brother   . Colon cancer Neg Hx     History  Substance Use Topics  . Smoking status: Former Smoker    Quit date: 06/01/1997  . Smokeless tobacco: Never Used   Comment: Quit 9 years ago.  . Alcohol Use: No    OB History    Grav Para Term Preterm Abortions TAB SAB Ect Mult Living                  Review of Systems  Constitutional: Negative for fever and diaphoresis.  Respiratory: Positive for shortness of breath.   Cardiovascular: Positive for chest  pain.  All other systems reviewed and are negative.    Allergies  Aspirin; Codeine; Demerol; Latex; and Lisinopril  Home Medications   Current Outpatient Rx  Name Route Sig Dispense Refill  . AZELASTINE HCL 137 MCG/SPRAY NA SOLN Nasal Place 1 spray into the nose 2 (two) times daily. Use in each nostril as directed    . BUDESONIDE-FORMOTEROL FUMARATE 80-4.5 MCG/ACT IN AERO Inhalation Inhale 2 puffs into the lungs 2 (two) times daily. 1 Inhaler 11  . CLONAZEPAM 1 MG PO TABS Oral Take 0.5 tablets (0.5 mg total) by mouth at bedtime as needed. Anxiety 30 tablet 5  . DICYCLOMINE HCL 10 MG PO CAPS Oral Take 10 mg by mouth 2 (two) times daily as needed. cramping and spasms.    Marland Kitchen  DOXYCYCLINE HYCLATE 100 MG PO CAPS  TAKE 2 CAPSULES TODAY THEN 1 CAPSULE BY MOUTH DAILY UNTIL GONE 8 capsule 4  . ESOMEPRAZOLE MAGNESIUM 40 MG PO CPDR Oral Take 40 mg by mouth daily.     Marland Kitchen EZETIMIBE 10 MG PO TABS Oral Take 1 tablet (10 mg total) by mouth daily. 90 tablet 1  . FERROUS SULFATE 325 (65 FE) MG PO TBEC Oral Take 325 mg by mouth daily.     . FUROSEMIDE 40 MG PO TABS Oral Take 80 mg by mouth daily. For HTN.    . INSULIN GLARGINE 100 UNIT/ML [place] SOLN Subcutaneous Inject 50 Units into the skin at bedtime.    . IVERMECTIN 3 MG PO TABS Oral Take 1 tablet (3 mg total) by mouth once. Take 18 mg (222mcg/kg, 100 kg) x 1, repeat in 2 weeks. 6 tablet 1  . LEVOTHYROXINE SODIUM 88 MCG PO TABS Oral Take 1 tablet (88 mcg total) by mouth daily. 90 tablet 3  . MEMANTINE HCL 10 MG PO TABS Oral Take 1 tablet (10 mg total) by mouth 2 (two) times daily. 60 tablet 11  . METFORMIN HCL 1000 MG PO TABS Oral Take 1 tablet (1,000 mg total) by mouth 2 (two) times daily with a meal. 180 tablet 3  . CENTRUM SILVER PO Oral Take 1 tablet by mouth daily.    Marland Kitchen OLANZAPINE 5 MG PO TABS Oral Take 1 tablet (5 mg total) by mouth at bedtime. 30 tablet 3  . OMEGA-3-ACID ETHYL ESTERS 1 G PO CAPS Oral Take 1 g by mouth 2 (two) times daily.    Marland Kitchen SITAGLIPTIN PHOSPHATE 100 MG PO TABS Oral Take 1 tablet (100 mg total) by mouth daily. 30 tablet 11  . TIOTROPIUM BROMIDE MONOHYDRATE 18 MCG IN CAPS Inhalation Place 1 capsule (18 mcg total) into inhaler and inhale daily. 90 capsule 3  . VALSARTAN 160 MG PO TABS Oral Take 1 tablet (160 mg total) by mouth daily. 90 tablet 3    BP 123/48  Pulse 73  Temp 98.4 F (36.9 C) (Oral)  Resp 17  SpO2 98%  Physical Exam  Nursing note and vitals reviewed. Constitutional: She is oriented to person, place, and time. She appears well-developed and well-nourished. No distress.  HENT:  Head: Normocephalic and atraumatic.  Right Ear: External ear normal.  Left Ear: External ear normal.  Nose:  Nose normal.  Mouth/Throat: Oropharynx is clear and moist.  Eyes: Conjunctivae normal and EOM are normal. Pupils are equal, round, and reactive to light.  Neck: Normal range of motion. Neck supple.  Cardiovascular: Normal rate, regular rhythm, normal heart sounds and intact distal pulses.   Pulmonary/Chest: Effort normal and breath sounds normal.  Abdominal: Soft. Bowel sounds are normal.  Musculoskeletal: Normal range of motion. She exhibits no edema and no tenderness.  Neurological: She is alert and oriented to person, place, and time. No cranial nerve deficit.  Skin: Skin is warm and dry.  Psychiatric: She has a normal mood and affect.    ED Course  Procedures (including critical care time)  Labs Reviewed  CBC - Abnormal; Notable for the following:    RBC 3.85 (*)     Hemoglobin 11.5 (*)     HCT 34.3 (*)     Platelets 128 (*)     All other components within normal limits  BASIC METABOLIC PANEL - Abnormal; Notable for the following:    Glucose, Bld 110 (*)     GFR calc non Af Amer 52 (*)     GFR calc Af Amer 60 (*)     All other components within normal limits  GLUCOSE, CAPILLARY - Abnormal; Notable for the following:    Glucose-Capillary 146 (*)     All other components within normal limits  GLUCOSE, CAPILLARY - Abnormal; Notable for the following:    Glucose-Capillary 142 (*)     All other components within normal limits  POCT I-STAT TROPONIN I  TROPONIN I  TROPONIN I  TROPONIN I   Dg Chest Port 1 View  08/11/2012  *RADIOLOGY REPORT*  Clinical Data: . Chest pain  PORTABLE CHEST - 1 VIEW  Comparison: 05/12/2012  Findings: Midline trachea.  Normal heart size for level of inspiration.  Atherosclerosis in the transverse aorta. No pleural effusion or pneumothorax.  Numerous leads and wires project over the chest.  Clear lungs.  IMPRESSION: No acute cardiopulmonary disease.   Original Report Authenticated By: Consuello Bossier, M.D.        Date: 08/11/2012  Rate: 71  Rhythm:  normal sinus rhythm  QRS Axis: normal  Intervals: normal  ST/T Wave abnormalities: nonspecific T wave changes  Conduction Disutrbances:left anterior fascicular block  Narrative Interpretation:   Old EKG Reviewed: changes noted   1. Chest pain, unspecified   2. Asthma with COPD   3. Dementia of the Alzheimer's type, with early onset, with delusions   4. Unspecified hypothyroidism   5. Type II or unspecified type diabetes mellitus without mention of complication, not stated as uncontrolled       MDM    Patient is a 66 year old female with PMH relevant for COPD, HTN, and DM2 who presents to the ED with complaints of chest pain and SOB.  Patient states that for several weeks she has had multiple, intermittent bouts of CP.  Prior to arrival patient given NTG which resolved symptoms.  AF and VS WNL.  On exam, patient with no signs of volume overload, normal heath/lung sounds, and otherwise as above.  Concern for ACS and thus EKG, CXR, and labs completed.  Review of results showed no acute abnormality requiring acute intervention.  However, for unstable angina workup patient felt to warrant admission.  Patient admitted to hospitalist service without acute events.          Johnney Ou, MD 08/12/12 [phone]

## 2012-08-11 NOTE — ED Notes (Signed)
[name] [phone] [name] [phone]

## 2012-08-11 NOTE — H&P (Signed)
[facility] History and Physical  [name]:[id number] DOB: August 27, 1946 DOA: 08/11/2012   PCP: Illene Regulus, MD   Chief Complaint:  Chief Complaint  Patient presents with  . Chest Pain     HPI: [name] is a 66 y.o. female with hx of DM, tobacco abuse , COPD, mild dementia who presented to the ED today c/o chest pain , across the chest, 10/10, pressure like, had some relief with NTG. Her ability to recall symptoms and describe them correctly is impaired by her memory and cognitive deficits. Initial evaluation in emergency room did not reveal EKG changes and troponins were negative so the patient placed on observation through the night on telemetry monitoring. Currently the patient says that the chest pain has resolved   Review of Systems:  Patient reports she had a chronic cough  The patient denies anorexia, fever, weight loss,, vision loss, decreased hearing, hoarseness, syncope, dyspnea on exertion, peripheral edema, balance deficits, hemoptysis, abdominal pain, melena, hematochezia, severe indigestion/heartburn, hematuria, incontinence, genital sores, muscle weakness, suspicious skin lesions, transient blindness, difficulty walking, depression, unusual weight change, abnormal bleeding, enlarged lymph nodes, angioedema, and breast masses.    Past Medical History  Diagnosis Date  . Personal history of colonic polyps   . Iron deficiency anemia, unspecified   . Other malaise and fatigue   . Chest pain, unspecified   . Other B-complex deficiencies   . Arthritis     Right Sacroiliac joint  . Other and unspecified hyperlipidemia   . Anxiety and depression   . HTN (hypertension)   . Unspecified hypothyroidism   . Type II or unspecified type diabetes mellitus without mention of complication, not stated as uncontrolled   . Allergic rhinitis, cause unspecified   . Unspecified asthma   . Chronic airway obstruction, not elsewhere classified   . Abnormal stress  test 08/21/2008    Neg  . GERD (gastroesophageal reflux disease)    Past Surgical History  Procedure Date  . Foot surgery     hammer toe 2004, 2005  . Abdominal hysterectomy 1982    menometorrhagia  . Carpal tunnel release 03/05/2009    bilateral   Social History:  reports that she quit smoking about 15 years ago. She has never used smokeless tobacco. She reports that she does not drink alcohol or use illicit drugs. Patient lives at home with her daughter-in-law supervises her 24 hours a day Allergies  Allergen Reactions  . Aspirin Itching  . Codeine Itching  . Demerol Itching    Unknown-unconscious.  . Latex Itching  . Lisinopril Itching and Cough    REACTION: causes cough    Family History  Problem Relation Age of Onset  . Hypertension Mother   . Diabetes Mother   . Heart disease Mother     CHF  . Coronary artery disease Mother   . Lung cancer Father   . Hypertension Father   . Diabetes Father   . Liver cancer Father     w/mets  . Prostate cancer Father   . Breast cancer Sister   . Lung disease Brother     Agent Orange Related  . Asthma Brother   . Allergies Brother   . Colon cancer Neg Hx      Prior to Admission medications   Medication Sig Start Date End Date Taking? Authorizing Provider  azelastine (ASTELIN) 137 MCG/SPRAY nasal spray Place 1 spray into the nose 2 (two) times daily. Use in each nostril as directed 12/21/11  Yes Jacques Navy, MD  budesonide-formoterol (SYMBICORT) 80-4.5 MCG/ACT inhaler Inhale 2 puffs into the lungs 2 (two) times daily. 07/27/11  Yes Jacques Navy, MD  clonazePAM (KLONOPIN) 1 MG tablet Take 0.5 tablets (0.5 mg total) by mouth at bedtime as needed. Anxiety 05/13/12  Yes Etta Grandchild, MD  dicyclomine (BENTYL) 10 MG capsule Take 10 mg by mouth 2 (two) times daily as needed. cramping and spasms. 06/01/12  Yes Meredith Pel, NP  doxycycline (VIBRAMYCIN) 100 MG capsule TAKE 2 CAPSULES TODAY THEN 1 CAPSULE BY MOUTH DAILY UNTIL  GONE 06/30/12  Yes Waymon Budge, MD  esomeprazole (NEXIUM) 40 MG capsule Take 40 mg by mouth daily.    Yes Historical Provider, MD  ezetimibe (ZETIA) 10 MG tablet Take 1 tablet (10 mg total) by mouth daily. 03/18/12  Yes Jacques Navy, MD  ferrous sulfate 325 (65 FE) MG EC tablet Take 325 mg by mouth daily.    Yes Historical Provider, MD  furosemide (LASIX) 40 MG tablet Take 80 mg by mouth daily. For HTN. 07/26/12  Yes Jacques Navy, MD  insulin glargine (LANTUS) 100 UNIT/ML injection Inject 50 Units into the skin at bedtime. 06/28/12  Yes Jacques Navy, MD  ivermectin (STROMECTOL) 3 MG TABS Take 1 tablet (3 mg total) by mouth once. Take 18 mg (219mcg/kg, 100 kg) x 1, repeat in 2 weeks. 07/19/12  Yes Jacques Navy, MD  levothyroxine (SYNTHROID) 88 MCG tablet Take 1 tablet (88 mcg total) by mouth daily. 07/21/12 07/21/13 Yes Jacques Navy, MD  memantine (NAMENDA) 10 MG tablet Take 1 tablet (10 mg total) by mouth 2 (two) times daily. 07/12/12 07/12/13 Yes Jacques Navy, MD  metFORMIN (GLUCOPHAGE) 1000 MG tablet Take 1 tablet (1,000 mg total) by mouth 2 (two) times daily with a meal. 08/24/11  Yes Jacques Navy, MD  Multiple Vitamins-Minerals (CENTRUM SILVER PO) Take 1 tablet by mouth daily.   Yes Historical Provider, MD  OLANZapine (ZYPREXA) 5 MG tablet Take 1 tablet (5 mg total) by mouth at bedtime. 07/12/12 08/11/12 Yes Jacques Navy, MD  omega-3 acid ethyl esters (LOVAZA) 1 G capsule Take 1 g by mouth 2 (two) times daily.   Yes Historical Provider, MD  sitaGLIPtin (JANUVIA) 100 MG tablet Take 1 tablet (100 mg total) by mouth daily. 06/09/12 06/09/13 Yes Jacques Navy, MD  tiotropium (SPIRIVA HANDIHALER) 18 MCG inhalation capsule Place 1 capsule (18 mcg total) into inhaler and inhale daily. 07/27/11  Yes Jacques Navy, MD  valsartan (DIOVAN) 160 MG tablet Take 1 tablet (160 mg total) by mouth daily. 06/09/12  Yes Jacques Navy, MD   Physical Exam: Filed Vitals:   08/11/12 1551  08/11/12 1556 08/11/12 1702  BP:  123/48 98/53  Pulse:  73 73  Temp:  98.4 F (36.9 C)   TempSrc:  Oral   Resp:  17 16  SpO2: 96% 98% 100%     General:  Alert, oriented to place and time  Eyes: PERRLA, EOMI  ENT: clear pharynx, no exudate   Neck: no JVd  Cardiovascular: irregular, no murmurs   Respiratory: CTAB  Abdomen: soft, NT, BS present  Skin: pale, dry  Musculoskeletal: muscle weakness generalized   Psychiatric: seems flat and anxious  Neurologic: cn 2-12 intact, strength 5/5,sensation intact   Labs on Admission:  Basic Metabolic Panel:  Lab 08/11/12 1914  NA 140  K 3.5  CL 105  CO2 24  GLUCOSE 110*  BUN 23  CREATININE 1.07  CALCIUM 8.8  MG --  PHOS --    CBC:  Lab 08/11/12 1656  WBC 4.4  NEUTROABS --  HGB 11.5*  HCT 34.3*  MCV 89.1  PLT 128*   Cardiac Enzymes: Results for [name], [name] (MRN [id number]) as of 08/11/2012 19:01  Ref. Range 08/11/2012 17:16  Troponin i, poc Latest Range: 0.00-0.08 ng/mL 0.01    Radiological Exams on Admission: Dg Chest Port 1 View  08/11/2012  *RADIOLOGY REPORT*  Clinical Data: . Chest pain  PORTABLE CHEST - 1 VIEW  Comparison: 05/12/2012  Findings: Midline trachea.  Normal heart size for level of inspiration.  Atherosclerosis in the transverse aorta. No pleural effusion or pneumothorax.  Numerous leads and wires project over the chest.  Clear lungs.  IMPRESSION: No acute cardiopulmonary disease.   Original Report Authenticated By: Consuello Bossier, M.D.     EKG: Independently reviewed. Sinus arrythmia vs afib,   Assessment/Plan Principal Problem:  *CHEST PAIN, LEFT Active Problems:  HYPOTHYROIDISM  DIABETES MELLITUS  VITAMIN B12 DEFICIENCY  HYPERLIPIDEMIA  ANEMIA, IRON DEFICIENCY  ANXIETY DEPRESSION  HYPERTENSION  Asthma with COPD  Dementia of the Alzheimer's type, with early onset, with delusions  Memory loss   1. Chest pain-with features of typical as well as as atypical angina. More  concerning is the patient's respective profile which includes history of heavy tobacco abuse, diabetes mellitus, family history of coronary disease. She is unable to take aspirin due to allergies. Think is prudent to observe her overnight on telemetry, recheck troponins and decide in the morning about an outpatient stress test. We are unable to add nitro or  beta blocker due to hypotension. I will not start her on an antiplatelet agent unless we have clear evidence of myocardial ischemia 2. Arrhythmia-most likely significant sinus arrhythmia. EKG is of poor quality. I cannot completely rule out possibility of atrial fibrillation. Would repeat EKG to have a better understanding about the nature of the arrhythmia. 3. COPD-without acute exacerbation-continue Symbicort and Spiriva 4. Diabetes mellitus type 2-controlled-continue Lantus and check CBGs in-house 5. Mild chronic anemia-hemoglobin is stable as compared to prior results. Continue iron 6. Hypothyroidism-resume Synthroid 7. Mild memory deficits-we'll continue Zyprexa as per home orders  Code Status: Full code Family Communication: Daughter-in-law at the bedside Disposition Plan: Home  Time spent: 40 minutes  Ronnell Clinger [facility] Pager [phone]  If 7PM-7AM, please contact night-coverage [email or web address] Password [facility] 08/11/2012, 6:53 PM

## 2012-08-11 NOTE — ED Notes (Signed)
Per EMS, pt is from home with CC of chest pain from across her chest and up into her neck. Rating 1/10, gave 1 NTG dropped pressure from 150/80 to 70/40, started bilateral 18g with NS. 600 mls in and pressure up to 100/50.

## 2012-08-12 ENCOUNTER — Telehealth: Payer: Self-pay | Admitting: General Practice

## 2012-08-12 DIAGNOSIS — E119 Type 2 diabetes mellitus without complications: Secondary | ICD-10-CM

## 2012-08-12 LAB — GLUCOSE, CAPILLARY

## 2012-08-12 MED ORDER — FLUOCINONIDE 0.05 % EX CREA
TOPICAL_CREAM | Freq: Two times a day (BID) | CUTANEOUS | Status: DC
  Administered 2012-08-12 – 2012-08-13 (×3): via TOPICAL
  Filled 2012-08-12: qty 30

## 2012-08-12 MED ORDER — MAGNESIUM HYDROXIDE 400 MG/5ML PO SUSP: 30.0000 mL | Freq: Every day | ORAL | Status: DC | PRN

## 2012-08-12 MED ORDER — DESOXIMETASONE 0.25 % EX CREA
TOPICAL_CREAM | Freq: Two times a day (BID) | CUTANEOUS | Status: DC
  Filled 2012-08-12: qty 15

## 2012-08-12 NOTE — Progress Notes (Signed)
Utilization review complete 

## 2012-08-12 NOTE — Progress Notes (Signed)
Subjective: [name], a pleasantly demented woman with DM, HTN, obesity presented wit chest pain and was admitted for r/o. She is noted to have evidence of a lateral infarct age undetermined on EKG. Her initial cardiac enzymes have been normal. She reports having had several minor, short-lived episodes of chest pain since admission. At exam she is pain free  Objective: Lab: Lab Results  Component Value Date   WBC 4.4 08/11/2012   HGB 11.5* 08/11/2012   HCT 34.3* 08/11/2012   MCV 89.1 08/11/2012   PLT 128* 08/11/2012   BMET    Component Value Date/Time   NA 140 08/11/2012 1656   K 3.5 08/11/2012 1656   CL 105 08/11/2012 1656   CO2 24 08/11/2012 1656   GLUCOSE 110* 08/11/2012 1656   BUN 23 08/11/2012 1656   CREATININE 1.07 08/11/2012 1656   CALCIUM 8.8 08/11/2012 1656   GFRNONAA 52* 08/11/2012 1656   GFRAA 60* 08/11/2012 1656   Cardiac Panel (last 3 results)  Basename 08/12/12 0319 08/11/12 2123  CKTOTAL -- --  CKMB -- --  TROPONINI <0.30 <0.30  RELINDX -- --    Imaging:  Scheduled Meds:   . azelastine  1 spray Each Nare BID  . budesonide-formoterol  2 puff Inhalation BID  . clonazePAM  0.5 mg Oral QHS  . desoximetasone   Topical BID  . enoxaparin (LOVENOX) injection  40 mg Subcutaneous Q24H  . ezetimibe  10 mg Oral Daily  . ferrous sulfate  325 mg Oral Daily  . furosemide  40 mg Oral Daily  . insulin aspart  0-9 Units Subcutaneous TID WC  . insulin glargine  50 Units Subcutaneous QHS  . irbesartan  75 mg Oral Daily  . levothyroxine  88 mcg Oral Daily  . linagliptin  5 mg Oral Daily  . memantine  10 mg Oral BID  . metFORMIN  1,000 mg Oral BID WC  . OLANZapine  5 mg Oral QHS  . omega-3 acid ethyl esters  1 g Oral BID  . pantoprazole  40 mg Oral Daily  . sodium chloride  1,000 mL Intravenous Once  . sodium chloride  3 mL Intravenous Q12H  . tiotropium  18 mcg Inhalation Daily   Continuous Infusions:  PRN Meds:.acetaminophen, acetaminophen,  guaiFENesin-dextromethorphan   Physical Exam: Filed Vitals:   08/11/12 1915  BP: 138/78  Pulse: 74  Temp:   Resp: 18   Obese white woamn sitting on the side of the bed in no distress HEENT- C&S clear Cor - 2+ radial, quiet precordium, RRR, II/VI murmur best at RSB, no JVD in sitting position, no carotid bruits Pulm - normal respirations, CTAP Abd - obese, soft, no guarding or rebound     Assessment/Plan: 1. Cardiac - typical chest pain in a high risk patient. Cardiac enzymes normal. EKG old injury. Chart reviewed - no previous testing for risk stratification. Plan -  complete cycling cardiac enzymes  dobutamine nuclear stress test in AM  2. COPD - stable with no increased work of breathing  3. DM -  Lab Results  Component Value Date   HGBA1C 8.2* 06/22/2012   CBG (last 3)   Basename 08/12/12 0600 08/11/12 2045  GLUCAP 142* 146*    Plan - continue sliding scale   4. 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

## 2012-08-13 ENCOUNTER — Observation Stay (HOSPITAL_COMMUNITY): Payer: Medicare HMO

## 2012-08-13 DIAGNOSIS — R079 Chest pain, unspecified: Secondary | ICD-10-CM

## 2012-08-13 DIAGNOSIS — I10 Essential (primary) hypertension: Secondary | ICD-10-CM

## 2012-08-13 LAB — GLUCOSE, CAPILLARY

## 2012-08-13 MED ORDER — SODIUM CHLORIDE 0.9 % IV SOLN
10.0000 ug/kg | INTRAVENOUS | Status: DC
  Administered 2012-08-13: 10 ug/kg/h via INTRAVENOUS
  Filled 2012-08-13: qty 12

## 2012-08-13 MED ORDER — FLUOCINONIDE 0.05 % EX CREA: TOPICAL_CREAM | Freq: Two times a day (BID) | CUTANEOUS | Status: DC

## 2012-08-13 MED ORDER — TECHNETIUM TC 99M SESTAMIBI - CARDIOLITE
30.0000 | Freq: Once | INTRAVENOUS | Status: AC | PRN
  Administered 2012-08-13: 30 via INTRAVENOUS

## 2012-08-13 MED ORDER — TECHNETIUM TC 99M SESTAMIBI GENERIC - CARDIOLITE
10.0000 | Freq: Once | INTRAVENOUS | Status: AC | PRN
  Administered 2012-08-13: 10 via INTRAVENOUS

## 2012-08-13 MED ORDER — SODIUM CHLORIDE 0.9 % IV SOLN
10.0000 ug/kg | INTRAVENOUS | Status: DC
  Administered 2012-08-13: 10 ug/kg/min via INTRAVENOUS
  Filled 2012-08-13: qty 12

## 2012-08-13 MED ORDER — FUROSEMIDE 40 MG PO TABS: 40.0000 mg | ORAL_TABLET | Freq: Every day | ORAL | Status: DC

## 2012-08-13 NOTE — Progress Notes (Signed)
08/13/2012 4:28 PM Nursing note Discharge avs form, medications already taken today and those due this evening given and explained to patient as well as changes to lasix dosing. Location of called in rx explained to patient. Spoke with pharmacy regarding Metformin, no dye given during Myoview, safe for pt. To continue per orders. Follow up appointments and when to call MD reviewed. D/c iv line by NT. D/c tele. D/c home per orders. Questions and concerns addressed.  Aricela Bertagnolli, Blanchard Kelch

## 2012-08-13 NOTE — Discharge Summary (Signed)
Physician Discharge Summary  Patient ID: [name] MRN: [id number] DOB/AGE: 06-Nov-1946 66 y.o.  Admit date: 08/11/2012 Discharge date: 08/13/2012  Admission Diagnoses: Chest pain with shortness of breath  Discharge Diagnoses:  Principal Problem:  *CHEST PAIN, LEFT Active Problems:  HYPOTHYROIDISM  DIABETES MELLITUS  VITAMIN B12 DEFICIENCY  HYPERLIPIDEMIA  ANEMIA, IRON DEFICIENCY  ANXIETY DEPRESSION  HYPERTENSION  Asthma with COPD  Dementia of the Alzheimer's type, with early onset, with delusions  Memory loss   Discharged Condition: good  Hospital Course: [name] is followed for multiple medical problems including diabetes, hypertension, hyperlipidemia, COPD, and dementia. She presented to the ED with c/o chest pain on the left along with increased SOB. Due to her multiple risk factors she was brought in on observation status to rule out for MI. She was on a telemetry floor and demonstrated no arrhytmias. Cardiac enzymes were cycled and were negative:  Cardiac Panel (last 3 results)  Basename 08/12/12 0918 08/12/12 0319 08/11/12 2123  CKTOTAL -- -- --  CKMB -- -- --  TROPONINI <0.30 <0.30 <0.30  RELINDX -- -- --   She did have an abnormal EKG with evidence of possible lateral injury. With her multiple risk factors a dobutamine nuclear stress test was ordered but could not be done until the morning of Sept. 14th. This test revealed no evidence of inducible ischemia.   With the patient having no further chest pain, negative enzyme and a normal stress test she is felt ready for discharge home.  All of her other medical problems remained stable.   Consults: None  Significant Diagnostic Studies: labs: negative cardiac enzymes and nuclear medicine: negative dobutamine stress test  Treatments: telemetry observation and cardiac work-up  Discharge Exam: Blood pressure 122/42, pulse 72, temperature 97.7 F (36.5 C), temperature source Oral, resp. rate 18, height 5'  3" (1.6 m), weight 200 lb (90.719 kg), SpO2 98.00%.  At rounds patient was in nuclear med/radiology. Vital signs remain stable.  Exam Sept 13th : Obese white woamn sitting on the side of the bed in no distress  HEENT- C&S clear  Cor - 2+ radial, quiet precordium, RRR, II/VI murmur best at RSB, no JVD in sitting position, no carotid bruits  Pulm - normal respirations, CTAP  Abd - obese, soft, no guarding or rebound  Disposition: 01-Home or Self Care  Discharge Orders    Future Appointments: Provider: Department: Dept Phone: Center:   11/28/2012 1:30 PM Waymon Budge, MD [facility] [phone] None     Future Orders Please Complete By Expires   Diet - low sodium heart healthy      Diet Carb Modified      Discharge instructions      Comments:   1. All blood tests, EKG and nuclear stress test were negative for any heart problem or evidence of coronary artery blockage 2. Resume all your home medications. 3. Office follow-up in 5-7 days: office will call with appointment   Activity as tolerated - No restrictions      Call MD for:  severe uncontrolled pain      Call MD for:  difficulty breathing, headache or visual disturbances          Medication List     As of 08/13/2012  3:50 PM    TAKE these medications         azelastine 137 MCG/SPRAY nasal spray   Commonly known as: ASTELIN   Place 1 spray into the nose 2 (two) times daily. Use  in each nostril as directed      budesonide-formoterol 80-4.5 MCG/ACT inhaler   Commonly known as: SYMBICORT   Inhale 2 puffs into the lungs 2 (two) times daily.      CENTRUM SILVER PO   Take 1 tablet by mouth daily.      clonazePAM 1 MG tablet   Commonly known as: KLONOPIN   Take 0.5 tablets (0.5 mg total) by mouth at bedtime as needed. Anxiety      dicyclomine 10 MG capsule   Commonly known as: BENTYL   Take 10 mg by mouth 2 (two) times daily as needed. cramping and spasms.      doxycycline 100 MG capsule   Commonly known as:  VIBRAMYCIN   TAKE 2 CAPSULES TODAY THEN 1 CAPSULE BY MOUTH DAILY UNTIL GONE      esomeprazole 40 MG capsule   Commonly known as: NEXIUM   Take 40 mg by mouth daily.      ezetimibe 10 MG tablet   Commonly known as: ZETIA   Take 1 tablet (10 mg total) by mouth daily.      ferrous sulfate 325 (65 FE) MG EC tablet   Take 325 mg by mouth daily.      fluocinonide cream 0.05 %   Commonly known as: LIDEX   Apply topically 2 (two) times daily.      furosemide 40 MG tablet   Commonly known as: LASIX   Take 80 mg by mouth daily. For HTN.      furosemide 40 MG tablet   Commonly known as: LASIX   Take 1 tablet (40 mg total) by mouth daily. For HTN.      insulin glargine 100 UNIT/ML injection   Commonly known as: LANTUS   Inject 50 Units into the skin at bedtime.      ivermectin 3 MG Tabs   Commonly known as: STROMECTOL   Take 1 tablet (3 mg total) by mouth once. Take 18 mg (223mcg/kg, 100 kg) x 1, repeat in 2 weeks.      levothyroxine 88 MCG tablet   Commonly known as: SYNTHROID, LEVOTHROID   Take 1 tablet (88 mcg total) by mouth daily.      memantine 10 MG tablet   Commonly known as: NAMENDA   Take 1 tablet (10 mg total) by mouth 2 (two) times daily.      metFORMIN 1000 MG tablet   Commonly known as: GLUCOPHAGE   Take 1 tablet (1,000 mg total) by mouth 2 (two) times daily with a meal.      OLANZapine 5 MG tablet   Commonly known as: ZYPREXA   Take 1 tablet (5 mg total) by mouth at bedtime.      omega-3 acid ethyl esters 1 G capsule   Commonly known as: LOVAZA   Take 1 g by mouth 2 (two) times daily.      sitaGLIPtin 100 MG tablet   Commonly known as: JANUVIA   Take 1 tablet (100 mg total) by mouth daily.      tiotropium 18 MCG inhalation capsule   Commonly known as: SPIRIVA   Place 1 capsule (18 mcg total) into inhaler and inhale daily.      valsartan 160 MG tablet   Commonly known as: DIOVAN   Take 1 tablet (160 mg total) by mouth daily.          SignedIllene Regulus 08/13/2012, 3:50 PM

## 2012-08-15 ENCOUNTER — Ambulatory Visit: Payer: Medicare Other | Admitting: Internal Medicine

## 2012-08-15 ENCOUNTER — Telehealth: Payer: Self-pay | Admitting: Internal Medicine

## 2012-08-15 MED ORDER — ALBUTEROL SULFATE (2.5 MG/3ML) 0.083% IN NEBU: 2.5000 mg | INHALATION_SOLUTION | Freq: Four times a day (QID) | RESPIRATORY_TRACT | Status: DC

## 2012-08-15 NOTE — Telephone Encounter (Signed)
I spoke with pt and she is wanting refill on albuterol nebulizer medication. I do not see this on her current medication list. Only see that this was rx'd 05/2011 by Dr. Fabiola Backer else in system. Pt stated she uses this PRN. Please advise if okat to send in RX for this Dr. Maple Hudson thanks.  Last OV 05/27/12

## 2012-08-15 NOTE — Telephone Encounter (Signed)
rx sent. Pt is aware. Tanner Vigna, CMA  

## 2012-08-15 NOTE — Telephone Encounter (Signed)
Per CY-okay to RX Albuterol 0.083% #50 1 qod prn with prn refills.

## 2012-08-15 NOTE — ED Provider Notes (Signed)
I saw and evaluated the patient, reviewed the resident's note and I agree with the findings and plan and agree with their ECG interpretation. Chest pain. Worrisome for cardiac cause. Admitted to medicine  [name]. Rubin Payor, MD 08/15/12 1538

## 2012-08-16 ENCOUNTER — Ambulatory Visit (INDEPENDENT_AMBULATORY_CARE_PROVIDER_SITE_OTHER): Payer: Medicare HMO | Admitting: General Practice

## 2012-08-16 ENCOUNTER — Ambulatory Visit (INDEPENDENT_AMBULATORY_CARE_PROVIDER_SITE_OTHER): Payer: Medicare HMO

## 2012-08-16 DIAGNOSIS — E538 Deficiency of other specified B group vitamins: Secondary | ICD-10-CM

## 2012-08-16 DIAGNOSIS — J309 Allergic rhinitis, unspecified: Secondary | ICD-10-CM

## 2012-08-16 MED ORDER — CYANOCOBALAMIN 1000 MCG/ML IJ SOLN
1000.0000 ug | Freq: Once | INTRAMUSCULAR | Status: AC
  Administered 2012-08-16: 1000 ug via INTRAMUSCULAR

## 2012-08-16 NOTE — Telephone Encounter (Signed)
Opened in error

## 2012-08-18 ENCOUNTER — Telehealth: Payer: Self-pay | Admitting: Internal Medicine

## 2012-08-18 ENCOUNTER — Telehealth: Payer: Self-pay | Admitting: *Deleted

## 2012-08-18 MED ORDER — AZITHROMYCIN 250 MG PO TABS: 250.0000 mg | ORAL_TABLET | ORAL | Status: DC

## 2012-08-18 NOTE — Telephone Encounter (Signed)
Patient is requesting refill on fluconazole. Last refill 05/12/2012. ? Please advise

## 2012-08-18 NOTE — Telephone Encounter (Signed)
Should not require repeat dosing unless she is having a heavy discharge

## 2012-08-18 NOTE — Telephone Encounter (Signed)
Pt requests nebulizer meds be called into [facility].  Pt states she requested this last wk  & we did not call anything into [facility].  Pt would like to know when this will be done.  Pt states she is in need of a ZPak as previously requested.  Antionette Fairy

## 2012-08-18 NOTE — Telephone Encounter (Signed)
Ok to send Z pak 

## 2012-08-18 NOTE — Telephone Encounter (Signed)
[name] states that [facility] advised they did not receive our fax for her nebulizer meds.  Requests that we re-fax to [phone].  Antionette Fairy

## 2012-08-18 NOTE — Telephone Encounter (Signed)
Rx was sent to pharm and I spoke with pt and notified that this was done.

## 2012-08-18 NOTE — Telephone Encounter (Signed)
Spoke with pt and advised that her rx was already sent to [facility]  She is c/o non prod cough, PND, sinus pressure/HA and wheezing x 1 wk. She started taking doxy 100 that she had on hand and has taken 4 doses and is not improving. She is requesting that we call in zpack. Please advise, thanks! Last ov 05-27-12 Next ov 11-28-12 Allergies  Allergen Reactions  . Aspirin Itching  . Codeine Itching  . Demerol Itching    Unknown-unconscious.  . Latex Itching  . Lisinopril Itching and Cough    REACTION: causes cough

## 2012-08-19 ENCOUNTER — Other Ambulatory Visit: Payer: Self-pay | Admitting: *Deleted

## 2012-08-19 MED ORDER — FLUOCINONIDE 0.05 % EX CREA: TOPICAL_CREAM | Freq: Two times a day (BID) | CUTANEOUS | Status: DC

## 2012-08-19 NOTE — Telephone Encounter (Signed)
PHARMACY REQUEST REFILL DENIED PER DR. Debby Bud, PATIENT WILL NEED APPT. WITH HIM IF CONTINUING WITH PROBLEM OF NEED FOR THIS MEDICAITON

## 2012-08-19 NOTE — Telephone Encounter (Signed)
Pharmacy notified of this and to inform patient will need appt. To follow up with Dr, Debby Bud.

## 2012-08-23 ENCOUNTER — Ambulatory Visit (INDEPENDENT_AMBULATORY_CARE_PROVIDER_SITE_OTHER): Payer: Medicaid Other | Admitting: Internal Medicine

## 2012-08-23 ENCOUNTER — Encounter: Payer: Self-pay | Admitting: Internal Medicine

## 2012-08-23 VITALS — BP 128/70 | HR 70 | Temp 96.8°F | Resp 16 | Wt 213.0 lb

## 2012-08-23 DIAGNOSIS — N3281 Overactive bladder: Secondary | ICD-10-CM

## 2012-08-23 DIAGNOSIS — N318 Other neuromuscular dysfunction of bladder: Secondary | ICD-10-CM

## 2012-08-23 DIAGNOSIS — R0789 Other chest pain: Secondary | ICD-10-CM

## 2012-08-23 NOTE — Assessment & Plan Note (Signed)
Symptoms of frequency, urgency, nocturia in the absence of infectious symptoms. (Sept '13)  Plan - trial of Myrbetrig 25 mg (7 days sample)

## 2012-08-23 NOTE — Patient Instructions (Addendum)
Cardiac issues - you had no evidence of heart related pain based on the lab work and the negative nuclear stress test.   Urinary frequency and urgency - symptoms suggest Over Active Bladder (OAB) also called irritable bladder syndrome. Plan - trial of Myrbitrig once a day - if it works this makes the diagnosis  Continue all your medications.

## 2012-08-23 NOTE — Progress Notes (Signed)
Subjective:    Patient ID: [name], female    DOB: [birth year], 66 y.o.   MRN: [id number]  HPI [name] was recently hospitalized for atypical chest pain. Hospital notes and studies reviewed in detail with the patient and her family. She had negative cardiac enzymes and did have a negative nuclear stress test. Since d/c she has not had any recurrent episodes of chest pain.  She does report that she is having frequency with some urgency but small volume many times a day.  No fever, dysuria, or change in appearance of urine  PMH, FamHx and SocHx reviewed for any changes and relevance.  Current Outpatient Prescriptions on File Prior to Visit  Medication Sig Dispense Refill  . albuterol (PROVENTIL) (2.5 MG/3ML) 0.083% nebulizer solution Take 3 mLs (2.5 mg total) by nebulization 4 (four) times daily.  50 mL  12  . azelastine (ASTELIN) 137 MCG/SPRAY nasal spray Place 1 spray into the nose 2 (two) times daily. Use in each nostril as directed      . azithromycin (ZITHROMAX) 250 MG tablet Take 1 tablet (250 mg total) by mouth as directed.  6 tablet  0  . budesonide-formoterol (SYMBICORT) 80-4.5 MCG/ACT inhaler Inhale 2 puffs into the lungs 2 (two) times daily.  1 Inhaler  11  . clonazePAM (KLONOPIN) 1 MG tablet Take 0.5 tablets (0.5 mg total) by mouth at bedtime as needed. Anxiety  30 tablet  5  . dicyclomine (BENTYL) 10 MG capsule Take 10 mg by mouth 2 (two) times daily as needed. cramping and spasms.      Marland Kitchen doxycycline (VIBRAMYCIN) 100 MG capsule TAKE 2 CAPSULES TODAY THEN 1 CAPSULE BY MOUTH DAILY UNTIL GONE  8 capsule  4  . esomeprazole (NEXIUM) 40 MG capsule Take 40 mg by mouth daily.       Marland Kitchen ezetimibe (ZETIA) 10 MG tablet Take 1 tablet (10 mg total) by mouth daily.  90 tablet  1  . ferrous sulfate 325 (65 FE) MG EC tablet Take 325 mg by mouth daily.       . fluocinonide cream (LIDEX) 0.05 % Apply topically 2 (two) times daily.  30 g  1  . furosemide (LASIX) 40 MG tablet Take 80 mg by  mouth daily. For HTN.      . furosemide (LASIX) 40 MG tablet Take 1 tablet (40 mg total) by mouth daily. For HTN.  30 tablet  5  . insulin glargine (LANTUS) 100 UNIT/ML injection Inject 50 Units into the skin at bedtime.      Marland Kitchen ivermectin (STROMECTOL) 3 MG TABS Take 1 tablet (3 mg total) by mouth once. Take 18 mg (267mcg/kg, 100 kg) x 1, repeat in 2 weeks.  6 tablet  1  . levothyroxine (SYNTHROID) 88 MCG tablet Take 1 tablet (88 mcg total) by mouth daily.  90 tablet  3  . memantine (NAMENDA) 10 MG tablet Take 1 tablet (10 mg total) by mouth 2 (two) times daily.  60 tablet  11  . metFORMIN (GLUCOPHAGE) 1000 MG tablet Take 1 tablet (1,000 mg total) by mouth 2 (two) times daily with a meal.  180 tablet  3  . Multiple Vitamins-Minerals (CENTRUM SILVER PO) Take 1 tablet by mouth daily.      Marland Kitchen omega-3 acid ethyl esters (LOVAZA) 1 G capsule Take 1 g by mouth 2 (two) times daily.      . sitaGLIPtin (JANUVIA) 100 MG tablet Take 1 tablet (100 mg total) by mouth daily.  30  tablet  11  . tiotropium (SPIRIVA HANDIHALER) 18 MCG inhalation capsule Place 1 capsule (18 mcg total) into inhaler and inhale daily.  90 capsule  3  . valsartan (DIOVAN) 160 MG tablet Take 1 tablet (160 mg total) by mouth daily.  90 tablet  3  . OLANZapine (ZYPREXA) 5 MG tablet Take 1 tablet (5 mg total) by mouth at bedtime.  30 tablet  3  . DISCONTD: Calcium Carbonate-Vitamin D (CALCIUM 600/VITAMIN D) 600-400 MG-UNIT per tablet Take 1 tablet by mouth 2 (two) times daily.        Marland Kitchen DISCONTD: sodium chloride (OCEAN) 0.65 % nasal spray Place 2 sprays into the nose as needed. TO OPEN NASAL BREATHING          Review of Systems System review is negative for any constitutional, cardiac, pulmonary, GI or neuro symptoms or complaints other than as described in the HPI.     Objective:   Physical Exam Filed Vitals:   08/23/12 1056  BP: 128/70  Pulse: 70  Temp: 96.8 F (36 C)  Resp: 16   Wt Readings from Last 3 Encounters:  08/23/12  213 lb (96.616 kg)  08/11/12 200 lb (90.719 kg)  08/02/12 213 lb (96.616 kg)   Gen'l- older white woman in no distress HEENT- C&S clear Cor- 2+ radial RRR Pulm - normal respirations. Neuro - A&O x 3, speech clear, ambulates independently       Assessment & Plan:  Atypical chest pain - patient ruled out by cardiac enzymes and had a normal adenosine nuclear stress test.

## 2012-08-24 ENCOUNTER — Other Ambulatory Visit: Payer: Self-pay | Admitting: Internal Medicine

## 2012-08-24 NOTE — Telephone Encounter (Signed)
Caller: Melany/Patient; Patient Name: [name]; PCP: Illene Regulus (Adults only); Best Callback Phone Number: [phone]; Call regarding; Blood glucose test strips; daughter bought her a new meter- Contour Extra; needs a rx sent to [facility] on [address] for the test strips

## 2012-08-26 ENCOUNTER — Telehealth: Payer: Self-pay | Admitting: Internal Medicine

## 2012-08-26 MED ORDER — INSULIN GLARGINE 100 UNIT/ML ~~LOC~~ SOLN: 50.0000 [IU] | Freq: Every day | SUBCUTANEOUS | Status: DC

## 2012-08-26 NOTE — Telephone Encounter (Signed)
Caller: [name]/Child; Patient Name: [name]; PCP: Illene Regulus (Adults only); Best Callback Phone Number: [phone].  Daughter calling about lantus Rx.  States [facility] only has Rx 40u at bedtime, so they are telling her that it is too early to fill Rx for more insulin.  States Dr. Debby Bud increased her lantus to 50 units at bedtime recently while in the hospital.  Info to office for clarification/new Rx.   Uses [facility] [phone].   May reach family at [phone].

## 2012-09-01 MED ORDER — GLUCOSE BLOOD VI STRP: ORAL_STRIP | Status: DC

## 2012-09-01 NOTE — Telephone Encounter (Signed)
GLUCOSE CONTOUR  EXTRA TEST STRIPS SENT TO [facility].

## 2012-09-02 ENCOUNTER — Ambulatory Visit (INDEPENDENT_AMBULATORY_CARE_PROVIDER_SITE_OTHER): Payer: Medicare HMO

## 2012-09-02 DIAGNOSIS — J309 Allergic rhinitis, unspecified: Secondary | ICD-10-CM

## 2012-09-03 ENCOUNTER — Other Ambulatory Visit: Payer: Self-pay | Admitting: Internal Medicine

## 2012-09-03 ENCOUNTER — Other Ambulatory Visit: Payer: Self-pay | Admitting: Nurse Practitioner

## 2012-09-06 NOTE — Telephone Encounter (Signed)
Ok to refill doxycycline 

## 2012-09-06 NOTE — Telephone Encounter (Signed)
Please advise if okay to refill. Thanks.  

## 2012-09-07 ENCOUNTER — Telehealth: Payer: Self-pay | Admitting: Internal Medicine

## 2012-09-07 MED ORDER — DOXYCYCLINE HYCLATE 100 MG PO CAPS: ORAL_CAPSULE | ORAL | Status: DC

## 2012-09-07 NOTE — Telephone Encounter (Signed)
Patient notified of refill for Doxycycline and this has been sent to her pharmacy.

## 2012-09-07 NOTE — Telephone Encounter (Signed)
Will forward to CDY now, ok per Katie 

## 2012-09-07 NOTE — Telephone Encounter (Signed)
Per CY-okay to give Doxycycline 100 mg #8 take 2 today then 1 daily until gone no refills.  

## 2012-09-07 NOTE — Telephone Encounter (Signed)
Spoke with pt. She is c/o fever, HA, runny nose and prod cough with minimal clear sputum- onset 1 wk ago. Taking otc cold med (unsure of name) without relief. She is requesting rx for doxycycline. Will forward to doc of the day since CDY not here. Please advise, thanks! Allergies  Allergen Reactions  . Aspirin Itching  . Codeine Itching  . Demerol Itching    Unknown-unconscious.  . Latex Itching  . Lisinopril Itching and Cough    REACTION: causes cough

## 2012-09-08 NOTE — Telephone Encounter (Signed)
This as already been sent through phone note.

## 2012-09-09 ENCOUNTER — Ambulatory Visit (INDEPENDENT_AMBULATORY_CARE_PROVIDER_SITE_OTHER): Payer: Medicare HMO

## 2012-09-09 DIAGNOSIS — J309 Allergic rhinitis, unspecified: Secondary | ICD-10-CM

## 2012-09-15 ENCOUNTER — Other Ambulatory Visit: Payer: Self-pay | Admitting: *Deleted

## 2012-09-15 ENCOUNTER — Ambulatory Visit: Payer: Medicare HMO

## 2012-09-19 ENCOUNTER — Ambulatory Visit (INDEPENDENT_AMBULATORY_CARE_PROVIDER_SITE_OTHER): Payer: Medicare HMO

## 2012-09-19 ENCOUNTER — Telehealth: Payer: Self-pay | Admitting: *Deleted

## 2012-09-19 DIAGNOSIS — J309 Allergic rhinitis, unspecified: Secondary | ICD-10-CM

## 2012-09-19 MED ORDER — METFORMIN HCL 1000 MG PO TABS: 1000.0000 mg | ORAL_TABLET | Freq: Two times a day (BID) | ORAL | Status: DC

## 2012-09-19 MED ORDER — ALBUTEROL SULFATE HFA 108 (90 BASE) MCG/ACT IN AERS: 2.0000 | INHALATION_SPRAY | Freq: Four times a day (QID) | RESPIRATORY_TRACT | Status: DC | PRN

## 2012-09-19 NOTE — Telephone Encounter (Signed)
PATIENT REQUEST REFILL ON pROAIR inh (200 PUFFS) 8.5MG  . NOT SEEN ON MED LIST. IS THIS OK. PLEASE ADVISE.

## 2012-09-19 NOTE — Telephone Encounter (Signed)
Ok for albuterol - ProAir - 2 puffs 4 times a day as needed.

## 2012-09-21 ENCOUNTER — Other Ambulatory Visit: Payer: Self-pay | Admitting: *Deleted

## 2012-09-21 MED ORDER — TIOTROPIUM BROMIDE MONOHYDRATE 18 MCG IN CAPS: 18.0000 ug | ORAL_CAPSULE | Freq: Every day | RESPIRATORY_TRACT | Status: DC

## 2012-09-29 ENCOUNTER — Ambulatory Visit (INDEPENDENT_AMBULATORY_CARE_PROVIDER_SITE_OTHER): Payer: Medicare HMO

## 2012-09-29 ENCOUNTER — Other Ambulatory Visit: Payer: Self-pay | Admitting: *Deleted

## 2012-09-29 DIAGNOSIS — J309 Allergic rhinitis, unspecified: Secondary | ICD-10-CM

## 2012-09-29 MED ORDER — BUDESONIDE-FORMOTEROL FUMARATE 80-4.5 MCG/ACT IN AERO: 2.0000 | INHALATION_SPRAY | Freq: Two times a day (BID) | RESPIRATORY_TRACT | Status: DC

## 2012-10-03 ENCOUNTER — Other Ambulatory Visit: Payer: Self-pay | Admitting: Internal Medicine

## 2012-10-04 NOTE — Telephone Encounter (Signed)
Would this okay to fill? Please advise.

## 2012-10-05 ENCOUNTER — Other Ambulatory Visit: Payer: Self-pay | Admitting: Internal Medicine

## 2012-10-05 ENCOUNTER — Ambulatory Visit: Payer: Medicare HMO | Admitting: Internal Medicine

## 2012-10-05 DIAGNOSIS — Z0289 Encounter for other administrative examinations: Secondary | ICD-10-CM

## 2012-10-05 NOTE — Telephone Encounter (Signed)
Pt is not coming to today's appt.  She wants to go back on hydrocodone for arthritis.  The arthritis is worse. [facility] [address].

## 2012-10-06 ENCOUNTER — Other Ambulatory Visit: Payer: Self-pay | Admitting: Nurse Practitioner

## 2012-10-06 MED ORDER — HYDROCODONE-ACETAMINOPHEN 5-500 MG PO CAPS: 1.0000 | ORAL_CAPSULE | Freq: Three times a day (TID) | ORAL | Status: DC | PRN

## 2012-10-06 NOTE — Telephone Encounter (Signed)
Ok to refill hydrocodone with 2 refills

## 2012-10-06 NOTE — Telephone Encounter (Signed)
Rx for hydrocodone/APAP  Called into pharmacy [facility] [address].

## 2012-10-07 ENCOUNTER — Other Ambulatory Visit: Payer: Self-pay | Admitting: *Deleted

## 2012-10-07 ENCOUNTER — Telehealth: Payer: Self-pay | Admitting: *Deleted

## 2012-10-07 ENCOUNTER — Telehealth: Payer: Self-pay | Admitting: Internal Medicine

## 2012-10-07 MED ORDER — OLANZAPINE 5 MG PO TABS: 5.0000 mg | ORAL_TABLET | Freq: Every day | ORAL | Status: DC

## 2012-10-07 NOTE — Telephone Encounter (Signed)
Ok for doxy, to er over w/e if worsen

## 2012-10-07 NOTE — Telephone Encounter (Signed)
Faxed hydrocodone / APAP 5-500 refill to [facility] [address].

## 2012-10-07 NOTE — Telephone Encounter (Signed)
Pt aware that RX has been sent(one in my in-basket)and go to ER if worsens.

## 2012-10-07 NOTE — Telephone Encounter (Signed)
Called, spoke with pt.  C/o sinus drainage, "seasonal allergies," chest congestion, wheezing, some chest tightness, and coughing a lot with a small amount of yellow mucus x 1 wk.  Also reports increased SOB at rest and with exertion and chills.  Using Astelin.  Offered OV -- requesting doxy rx to be called in ASAP.  As Dr. Maple Hudson is off, will route to doc of the day.  Dr. Sherene Sires, pls advise.  Thank you.  Allergies verified: Allergies  Allergen Reactions  . Aspirin Itching  . Codeine Itching  . Demerol Itching    Unknown-unconscious.  . Latex Itching  . Lisinopril Itching and Cough    REACTION: causes cough    [employer]

## 2012-10-10 ENCOUNTER — Ambulatory Visit (INDEPENDENT_AMBULATORY_CARE_PROVIDER_SITE_OTHER): Payer: Medicare HMO

## 2012-10-10 DIAGNOSIS — J309 Allergic rhinitis, unspecified: Secondary | ICD-10-CM

## 2012-10-11 ENCOUNTER — Ambulatory Visit: Payer: Medicare HMO | Admitting: Internal Medicine

## 2012-10-14 ENCOUNTER — Other Ambulatory Visit: Payer: Self-pay | Admitting: *Deleted

## 2012-10-14 MED ORDER — EZETIMIBE 10 MG PO TABS: 10.0000 mg | ORAL_TABLET | Freq: Every day | ORAL | Status: DC

## 2012-10-21 ENCOUNTER — Other Ambulatory Visit: Payer: Self-pay | Admitting: *Deleted

## 2012-10-21 MED ORDER — AZELASTINE HCL 0.1 % NA SOLN: 1.0000 | Freq: Two times a day (BID) | NASAL | Status: DC

## 2012-10-25 ENCOUNTER — Ambulatory Visit (INDEPENDENT_AMBULATORY_CARE_PROVIDER_SITE_OTHER): Payer: Medicare HMO

## 2012-10-25 DIAGNOSIS — J309 Allergic rhinitis, unspecified: Secondary | ICD-10-CM

## 2012-10-31 ENCOUNTER — Ambulatory Visit: Payer: Medicare HMO | Admitting: Internal Medicine

## 2012-11-03 ENCOUNTER — Other Ambulatory Visit: Payer: Self-pay | Admitting: *Deleted

## 2012-11-03 NOTE — Telephone Encounter (Signed)
This request can wait for Dr. Debby Bud to return

## 2012-11-03 NOTE — Telephone Encounter (Signed)
Message from [facility]: Pt is requesting if she can get a new Rx for a 90 day supply of hydorcodone/acetaminophen 5-500, due to transportation issues. Please advise.

## 2012-11-04 ENCOUNTER — Telehealth: Payer: Self-pay | Admitting: Internal Medicine

## 2012-11-04 MED ORDER — DOXYCYCLINE HYCLATE 100 MG PO TABS: ORAL_TABLET | ORAL | Status: DC

## 2012-11-04 NOTE — Telephone Encounter (Signed)
Per CY, Doxy 100 #8 Take 2 tabs today and then 1 daily until finish. [facility] [address] Left detailed msg on machine for patient to pick up medication. Unable to reach patient on alternative #s in chart.

## 2012-11-04 NOTE — Telephone Encounter (Signed)
LM with pt to call back.

## 2012-11-04 NOTE — Telephone Encounter (Signed)
Pt returned call.  Pt also needs refill on Spiriva.  [name]

## 2012-11-04 NOTE — Telephone Encounter (Signed)
Pt states that she needs Doxy refilled because she feels like she is getting a sinus infection. She has a congested nose and is coughing with no production of mucus.  She is aware that we will have to talk to [facility] about this. Spiriva will be sent to her pharmacy, she is aware.  Please advise.

## 2012-11-07 ENCOUNTER — Ambulatory Visit (INDEPENDENT_AMBULATORY_CARE_PROVIDER_SITE_OTHER): Payer: Medicare HMO

## 2012-11-07 DIAGNOSIS — J309 Allergic rhinitis, unspecified: Secondary | ICD-10-CM

## 2012-11-11 ENCOUNTER — Telehealth: Payer: Self-pay | Admitting: Internal Medicine

## 2012-11-11 MED ORDER — DOXYCYCLINE HYCLATE 100 MG PO CAPS: ORAL_CAPSULE | ORAL | Status: DC

## 2012-11-11 NOTE — Telephone Encounter (Signed)
Per CY-- Doxycycline 100mg  capsules, one twice daily x7days #14

## 2012-11-11 NOTE — Telephone Encounter (Signed)
Daughter in law Martie Lee calls regarding lost prescription for Clonazepam.  Bottle was lost or stolen 2- 4 days ago and is requesting and early refill.  RN called [facility] on [address] and spoke to pharmacist who notes last refill was gotten on 08/21/12 for 90 days and is not due for refill until 11/20/12 by Dr. Debby Bud.  RN notes in med list under chart review it notes Dose: 0.5 mg Route: Oral Frequency: At bedtime PRN Dispense Quantity: 30 tablet Refills: 5 Fills Remaining: 5 Sig: Take 0.5 tablets (0.5 mg total) by mouth at bedtime as needed. Anxiety Written Date: 05/13/12 Expiration Date: 11/09/12 Start Date: 05/13/12 End Date: -- Prescribed by: Etta Grandchild, MD Authorized by: Etta Grandchild, MD;   Daughter in laws says she is needed it for sleep, but declined triaged.  Last office appointment was 08/23/12 with Dr. Debby Bud  Please follow up with patient as appropriate. Thanks

## 2012-11-11 NOTE — Telephone Encounter (Signed)
Called and spoke with pt and she stated that the doxy that was given to her on 12/6 by CY did not clear all of the congestion up.  She is requesting a refill of the doxy but would like the capsules called in b/c these seem to help her better.  CY please advise. Thanks  Last ov--02/02/2012 Next ov--11/28/2012  Allergies  Allergen Reactions  . Aspirin Itching  . Codeine Itching  . Demerol Itching    Unknown-unconscious.  . Latex Itching  . Lisinopril Itching and Cough    REACTION: causes cough

## 2012-11-12 ENCOUNTER — Other Ambulatory Visit: Payer: Self-pay | Admitting: Nurse Practitioner

## 2012-11-16 ENCOUNTER — Ambulatory Visit (INDEPENDENT_AMBULATORY_CARE_PROVIDER_SITE_OTHER): Payer: Medicare HMO

## 2012-11-16 ENCOUNTER — Telehealth: Payer: Self-pay | Admitting: Internal Medicine

## 2012-11-16 DIAGNOSIS — J309 Allergic rhinitis, unspecified: Secondary | ICD-10-CM

## 2012-11-16 NOTE — Telephone Encounter (Signed)
Daughter in law is calling to see if they can get a refill now on her Klonopin.  She has been without it several days and is not sleeping.

## 2012-11-16 NOTE — Telephone Encounter (Signed)
Ok to refill Klonopin x 5 refills

## 2012-11-17 ENCOUNTER — Other Ambulatory Visit: Payer: Self-pay | Admitting: *Deleted

## 2012-11-17 MED ORDER — CLONAZEPAM 1 MG PO TABS: 0.5000 mg | ORAL_TABLET | Freq: Every evening | ORAL | Status: DC | PRN

## 2012-11-20 ENCOUNTER — Telehealth: Payer: Self-pay | Admitting: Pulmonary Disease

## 2012-11-20 NOTE — Telephone Encounter (Signed)
Pt called with URI symptoms of runny nose, sneezing, sore throat.  Wanting abx called in.  She does have moderate copd,and has some cough with discolored mucus.  No f/c/s No significantly increased sob.   She has had doxy 12/6 and 12/13 this month.  I have explained this is likely a viral process, and recommended trying tylenol cold and sinus.  I also asked her to call the office in the am to get an apptm to be seen.  She is to go to ER if breathing worsens in interim.

## 2012-11-21 ENCOUNTER — Telehealth: Payer: Self-pay | Admitting: Internal Medicine

## 2012-11-21 MED ORDER — AMOXICILLIN-POT CLAVULANATE 875-125 MG PO TABS: 1.0000 | ORAL_TABLET | Freq: Two times a day (BID) | ORAL | Status: DC

## 2012-11-21 NOTE — Telephone Encounter (Signed)
Per CY-okay to give Augmentin 875mg  #14 take 1 po bid x 7 days no refills and Mucinex DM.

## 2012-11-21 NOTE — Telephone Encounter (Signed)
Pt aware of cdy recs. i have sent this to the pharmacy. Nothing further was needed

## 2012-11-21 NOTE — Telephone Encounter (Signed)
I spoke with [name]. C/o cough w/ green phlem, nasal congestion, sneezing, PND, wheezing, chest tx, slight sinus pressure and HA, right ear pain x 6 days. No f/c/s/n/v. She took otc flu and severe cold medication. She is requesting to have something called in. Please advise Dr. Maple Hudson thanks Last OV 05/27/12 Pending OV 11/28/12 [facility] [address] Allergies  Allergen Reactions  . Aspirin Itching  . Codeine Itching  . Demerol Itching    Unknown-unconscious.  . Latex Itching  . Lisinopril Itching and Cough    REACTION: causes cough

## 2012-11-22 ENCOUNTER — Other Ambulatory Visit: Payer: Self-pay | Admitting: *Deleted

## 2012-11-23 MED ORDER — CLONAZEPAM 1 MG PO TABS: 0.5000 mg | ORAL_TABLET | Freq: Every evening | ORAL | Status: DC | PRN

## 2012-11-28 ENCOUNTER — Ambulatory Visit (INDEPENDENT_AMBULATORY_CARE_PROVIDER_SITE_OTHER): Payer: Medicare HMO

## 2012-11-28 ENCOUNTER — Encounter: Payer: Self-pay | Admitting: Internal Medicine

## 2012-11-28 ENCOUNTER — Ambulatory Visit (INDEPENDENT_AMBULATORY_CARE_PROVIDER_SITE_OTHER): Payer: Medicare HMO | Admitting: Internal Medicine

## 2012-11-28 VITALS — BP 140/82 | HR 70 | Ht 63.0 in | Wt 204.0 lb

## 2012-11-28 DIAGNOSIS — J309 Allergic rhinitis, unspecified: Secondary | ICD-10-CM

## 2012-11-28 DIAGNOSIS — J449 Chronic obstructive pulmonary disease, unspecified: Secondary | ICD-10-CM

## 2012-11-28 DIAGNOSIS — J301 Allergic rhinitis due to pollen: Secondary | ICD-10-CM

## 2012-11-28 MED ORDER — LEVALBUTEROL HCL 0.63 MG/3ML IN NEBU
0.6300 mg | INHALATION_SOLUTION | Freq: Once | RESPIRATORY_TRACT | Status: AC
  Administered 2012-11-28: 0.63 mg via RESPIRATORY_TRACT

## 2012-11-28 MED ORDER — LEVALBUTEROL HCL 0.63 MG/3ML IN NEBU: 1.0000 | INHALATION_SOLUTION | Freq: Four times a day (QID) | RESPIRATORY_TRACT | Status: DC

## 2012-11-28 MED ORDER — METHYLPREDNISOLONE ACETATE 80 MG/ML IJ SUSP
80.0000 mg | Freq: Once | INTRAMUSCULAR | Status: AC
  Administered 2012-11-28: 80 mg via INTRAMUSCULAR

## 2012-11-28 NOTE — Progress Notes (Signed)
Patient ID: [name], female    DOB: [birth year], 66 y.o.   MRN: [id number]  HPI 05/19/11- 89 yoF former heavy smoker followed for asthma/ COPD, hx food intolerance, allergic rhinitis, complicated by obesity, DM Last here November 20, 2010- note reviewed Allergy vaccine 1:10 GH Says she has been doing well. Spring pollen bothered nose and chest in March. Had some infection then, helped by doxycycline.  Reports no change in dry hacking cough, not relieved by cough syrup. Occasional, infrequent heart burn. CXR 02/08/11- [place]- lungs clear, ASVD We discussed updating PFT. She had smoked up to 3 PPD  07/13/11- 65 yoF former heavy smoker followed for asthma/ COPD, hx food intolerance, allergic rhinitis, complicated by obesity, DM Did not schedule PFT after last visit as intended, but has done so now.  She feels well. Remains off cigarettes.  Coughs only after using her inhaler- only a dry cough. No longer experiences shortness of breath.  Allergy vaccine- she feels they help her a lot. Went to ENT for ear pain- blamed throat congestion on reflux and Rx'd bid Nexium- "helps a lot".   09/18/11-  65 yoF former heavy smoker followed for asthma/ COPD, hx food intolerance, allergic rhinitis, complicated by obesity, DM Has had flu vaccine. She had done fairly well since here in August. Last night she had onset of dry cough and head congestion without fever or sore throat. Intermittent pain in the right side of the neck at the angle of the jaw comes and goes. It is not associated with popping, vertigo, pain on chewing or altered hearing. History of smoking 3 packs per day. PFT 08/26/2011-FEV1 1.45/76%, FEV1/FVC 0.7 no, FEF 25-75% was 35% of predicted with slight response to bronchodilator. TLC 100% RV 131% DLCO 54%. Moderate obstruction with slight response to bronchodilator and air trapping. This is probably mostly some emphysema.  CXR 01/29/2011-atherosclerosis with clear lung fields.  02/12/12-   65 yoF former heavy smoker followed for asthma/ COPD, hx food intolerance, allergic rhinitis, complicated by obesity, DM Continues allergy vaccine without problems. She is sure it helps. She used her last doxycycline about 2 weeks ago for chest congestion. We discussed this. With some vertigo and pressure in right ear. Often has pain in right side of neck. Occasional minor cough. She is on lisinopril and we discussed this carefully. Using rescue inhaler twice a week. Last PFT was 08/26/2011. Charted.  05/27/12-   65 yoF former heavy smoker followed for asthma/ COPD, hx food intolerance, allergic rhinitis, complicated by obesity, DM    PCP Dr Debby Bud Still on Allergy Vaccine 1:10 here; states rash on body from thighs to neck-just itchy all the time; Denies any wheezing,cough,SOB, or congestion. Has had pruritic rash on trunk and arms for 3 weeks, cause unknown. Cortisone cream has not seemed to help. She had taken cephalexin for UTI last week the rash began before that. Breathing has been good with no recent exacerbation and insignificant cough and wheeze.  11/28/12-  65 yoF former heavy smoker followed for asthma/ COPD, hx food intolerance, allergic rhinitis, complicated by obesity, DM    PCP Dr Debby Bud FOLLOWS FOR: still on vaccine. having lots of congestion and wheezing. For past 3 weeks acute illness. Sore throat moved to chest congestion with much wheeze, cough productive of clear mucus. Denies nasal congestion. She tried Augmentin which did not help. Did have flu vaccine. Family members living in the same house are very sick with similar illness. She has continued allergy  vaccine 1:10 GH without problems CXR 08/11/12 IMPRESSION:  No acute cardiopulmonary disease.  Original Report Authenticated By: Consuello Bossier, M.D.    Review of Systems- see HPI Constitutional:   No-   weight loss, night sweats, fevers, chills, fatigue, lassitude. HEENT:   No-  headaches, difficulty swallowing, tooth/dental  problems, sore throat,       No-  sneezing, itching, +ear ache, nasal congestion, post nasal drip,  CV:  No-   chest pain, orthopnea, PND, swelling in lower extremities, anasarca, dizziness, palpitations Resp: Some  shortness of breath with exertion or at rest.              + productive cough,  + non-productive cough,  No-  coughing up of blood.              No-   change in color of mucus.  + wheezing.   Skin: HPI GI:  No-   heartburn, indigestion, abdominal pain, nausea, vomiting, GU: MS:  No-   joint pain or swelling.  Neuro- nothing unusual  Psych:  No- change in mood or affect. No depression or anxiety.  No memory loss.   Objective:   Physical Exam General- Alert, Oriented, Affect-appropriate, Distress- none acute    obese Skin- dry skin, very faint slightly papular rash with some excoriation demonstrated on arms and low back Lymphadenopathy- none Head- atraumatic            Eyes- Gross vision intact, PERRLA, conjunctivae clear secretions            Ears- Hearing, canals- normal            Nose- Clear, No- Septal dev, mucus, polyps, erosion, perforation             Throat- Mallampati III , mucosa clear , drainage- none, tonsils- atrophic      dentures Neck- flexible , trachea midline, no stridor , thyroid nl, carotid no bruit, nontender Chest - symmetrical excursion , unlabored           Heart/CV- RRR , no murmur , no gallop  , no rub, nl s1 s2                           - JVD- none , edema- none, stasis changes- none, varices- none           Lung- clear , wheeze- none, + light cough , dullness-none, rub- none           Chest wall-  Abd- Br/ Gen/ Rectal- Not done, not indicated Extrem- cyanosis- none, clubbing, none, atrophy- none, strength- nl Neuro- resting tremor/ head bob

## 2012-11-28 NOTE — Patient Instructions (Addendum)
Neb xop 0.63  Depo 80  We can continue allergy vaccine

## 2012-12-01 ENCOUNTER — Encounter: Payer: Self-pay | Admitting: Internal Medicine

## 2012-12-01 ENCOUNTER — Other Ambulatory Visit (INDEPENDENT_AMBULATORY_CARE_PROVIDER_SITE_OTHER): Payer: Medicare HMO

## 2012-12-01 ENCOUNTER — Ambulatory Visit (INDEPENDENT_AMBULATORY_CARE_PROVIDER_SITE_OTHER): Payer: Medicare HMO | Admitting: Internal Medicine

## 2012-12-01 VITALS — BP 134/88 | HR 82 | Temp 97.6°F | Resp 12 | Wt 200.0 lb

## 2012-12-01 DIAGNOSIS — D509 Iron deficiency anemia, unspecified: Secondary | ICD-10-CM

## 2012-12-01 DIAGNOSIS — E538 Deficiency of other specified B group vitamins: Secondary | ICD-10-CM

## 2012-12-01 DIAGNOSIS — E119 Type 2 diabetes mellitus without complications: Secondary | ICD-10-CM

## 2012-12-01 DIAGNOSIS — E785 Hyperlipidemia, unspecified: Secondary | ICD-10-CM

## 2012-12-01 DIAGNOSIS — E039 Hypothyroidism, unspecified: Secondary | ICD-10-CM

## 2012-12-01 DIAGNOSIS — I10 Essential (primary) hypertension: Secondary | ICD-10-CM

## 2012-12-01 DIAGNOSIS — R42 Dizziness and giddiness: Secondary | ICD-10-CM

## 2012-12-01 LAB — LIPID PANEL
Cholesterol: 211 mg/dL — ABNORMAL HIGH (ref 0–200)
Total CHOL/HDL Ratio: 4
VLDL: 48 mg/dL — ABNORMAL HIGH (ref 0.0–40.0)

## 2012-12-01 LAB — CBC WITH DIFFERENTIAL/PLATELET
Basophils Absolute: 0 10*3/uL (ref 0.0–0.1)
Eosinophils Absolute: 0 10*3/uL (ref 0.0–0.7)
Eosinophils Relative: 0.3 % (ref 0.0–5.0)
HCT: 40.4 % (ref 36.0–46.0)
Hemoglobin: 13.8 g/dL (ref 12.0–15.0)
MCHC: 34.2 g/dL (ref 30.0–36.0)
Monocytes Absolute: 0.6 10*3/uL (ref 0.1–1.0)
Neutrophils Relative %: 63.9 % (ref 43.0–77.0)
Platelets: 275 10*3/uL (ref 150.0–400.0)
RBC: 4.61 Mil/uL (ref 3.87–5.11)
RDW: 13.4 % (ref 11.5–14.6)

## 2012-12-01 LAB — HEMOGLOBIN A1C: Hgb A1c MFr Bld: 7.3 % — ABNORMAL HIGH (ref 4.6–6.5)

## 2012-12-01 LAB — COMPREHENSIVE METABOLIC PANEL
ALT: 24 U/L (ref 0–35)
AST: 29 U/L (ref 0–37)
Albumin: 3.6 g/dL (ref 3.5–5.2)
Alkaline Phosphatase: 62 U/L (ref 39–117)
Glucose, Bld: 106 mg/dL — ABNORMAL HIGH (ref 70–99)
Potassium: 2.8 mEq/L — CL (ref 3.5–5.1)
Sodium: 141 mEq/L (ref 135–145)
Total Protein: 6.8 g/dL (ref 6.0–8.3)

## 2012-12-01 LAB — HEPATIC FUNCTION PANEL
ALT: 24 U/L (ref 0–35)
Albumin: 3.6 g/dL (ref 3.5–5.2)
Total Protein: 6.8 g/dL (ref 6.0–8.3)

## 2012-12-01 LAB — VITAMIN B12: Vitamin B-12: 229 pg/mL (ref 211–911)

## 2012-12-01 MED ORDER — CLONAZEPAM 1 MG PO TABS: 1.0000 mg | ORAL_TABLET | Freq: Every evening | ORAL | Status: DC | PRN

## 2012-12-01 MED ORDER — ESOMEPRAZOLE MAGNESIUM 40 MG PO CPDR: 40.0000 mg | DELAYED_RELEASE_CAPSULE | Freq: Every day | ORAL | Status: DC

## 2012-12-01 NOTE — Progress Notes (Signed)
Subjective:    Patient ID: [name], female    DOB: Apr 02, 1946, 67 y.o.   MRN: [id number]  HPI [name] presents reporting dizziness: in the AM when she first gets up she will have dizziness that lasts about 5 minutes  Past Medical History  Diagnosis Date  . Personal history of colonic polyps   . Iron deficiency anemia, unspecified   . Other malaise and fatigue   . Chest pain, unspecified   . Other B-complex deficiencies   . Arthritis     Right Sacroiliac joint  . Other and unspecified hyperlipidemia   . Anxiety and depression   . HTN (hypertension)   . Unspecified hypothyroidism   . Type II or unspecified type diabetes mellitus without mention of complication, not stated as uncontrolled   . Allergic rhinitis, cause unspecified   . Unspecified asthma   . Chronic airway obstruction, not elsewhere classified   . Abnormal stress test 08/21/2008    Neg  . GERD (gastroesophageal reflux disease)    Past Surgical History  Procedure Date  . Foot surgery     hammer toe 2004, 2005  . Abdominal hysterectomy 1982    menometorrhagia  . Carpal tunnel release 03/05/2009    bilateral   Family History  Problem Relation Age of Onset  . Hypertension Mother   . Diabetes Mother   . Heart disease Mother     CHF  . Coronary artery disease Mother   . Lung cancer Father   . Hypertension Father   . Diabetes Father   . Liver cancer Father     w/mets  . Prostate cancer Father   . Breast cancer Sister   . Lung disease Brother     Agent Orange Related  . Asthma Brother   . Allergies Brother   . Colon cancer Neg Hx    History   Social History  . Marital Status: Legally Separated    Spouse Name: N/A    Number of Children: 6  . Years of Education: N/A   Occupational History  .     Social History Main Topics  . Smoking status: Former Smoker    Quit date: 06/01/1997  . Smokeless tobacco: Never Used     Comment: Quit 9 years ago.  . Alcohol Use: No  . Drug Use: No  .  Sexually Active: Not on file   Other Topics Concern  . 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 Looking for work but can't find a jobEnvironment: House with crawl space, central air conditioning, hard wood. No feather bedding, no mold. Son smokes. Pets including dogs, cats, 3 birds. Angioedema with Aspirin.Daily caffeine use two cups a dayRegular Exercise-yes    Current Outpatient Prescriptions on File Prior to Visit  Medication Sig Dispense Refill  . albuterol (PROAIR HFA) 108 (90 BASE) MCG/ACT inhaler Inhale 2 puffs into the lungs every 6 (six) hours as needed. For SOB.  1 Inhaler  5  . albuterol (PROVENTIL) (2.5 MG/3ML) 0.083% nebulizer solution Take 3 mLs (2.5 mg total) by nebulization 4 (four) times daily.  50 mL  12  . azelastine (ASTELIN) 137 MCG/SPRAY nasal spray Place 1 spray into the nose 2 (two) times daily. Use in each nostril as directed  30 mL  1  . budesonide-formoterol (SYMBICORT) 80-4.5 MCG/ACT inhaler Inhale 2 puffs into the lungs 2 (two) times daily.  10.2 g  11  . dicyclomine (BENTYL) 10 MG capsule TAKE ONE CAPSULE BY MOUTH TWICE DAILY FOR CRAMPING AND SPASMS  60 capsule  0  . esomeprazole (NEXIUM) 40 MG capsule Take 1 capsule (40 mg total) by mouth daily.  30 capsule  11  . ezetimibe (ZETIA) 10 MG tablet Take 1 tablet (10 mg total) by mouth daily.  90 tablet  1  . ferrous sulfate 325 (65 FE) MG EC tablet Take 325 mg by mouth daily.       . fluocinonide cream (LIDEX) 0.05 % Apply topically 2 (two) times daily.  30 g  1  . furosemide (LASIX) 40 MG tablet Take 1 tablet (40 mg total) by mouth daily. For HTN.  30 tablet  5  . glucose blood test strip .Marland KitchenUse as instructed CONTOUR EXTRA TEST STRIPS , USE AS DIRECTED ONE TIME A DAY,   DIAGNOSIS CODE 250.0.  100 each  6  . hydrocodone-acetaminophen (LORCET-HD) 5-500 MG per capsule Take 1 capsule by mouth every 8 (eight) hours as needed.  90 capsule  2  . insulin glargine (LANTUS) 100 UNIT/ML injection Inject 50 Units into the skin at bedtime.  45 mL  1  . levalbuterol (XOPENEX) 0.63 MG/3ML nebulizer solution Take 3 mLs (0.63 mg total) by nebulization 4 (four) times daily.  50 mL  12  . levothyroxine (SYNTHROID) 88 MCG tablet Take 1 tablet (88 mcg total) by mouth daily.  90 tablet  3  . memantine (NAMENDA) 10 MG tablet Take 1 tablet (10 mg total) by mouth 2 (two) times daily.  60 tablet  11  . metFORMIN (GLUCOPHAGE) 1000 MG tablet Take 1 tablet (1,000 mg total) by mouth 2 (two) times daily with a meal.  180 tablet  1  . Multiple Vitamins-Minerals (CENTRUM SILVER PO) Take 1 tablet by mouth daily.      Marland Kitchen OLANZapine (ZYPREXA) 5 MG tablet Take 5 mg by mouth at bedtime.      Marland Kitchen omega-3 acid ethyl esters (LOVAZA) 1 G capsule Take 1 g by mouth 2 (two) times daily.      . sitaGLIPtin (JANUVIA) 100 MG tablet Take 1 tablet (100 mg total) by mouth daily.  30 tablet  11  . tiotropium (SPIRIVA HANDIHALER) 18 MCG inhalation capsule Place 1 capsule (18 mcg total) into inhaler and inhale daily.  90 capsule  3  . valsartan (DIOVAN) 160 MG tablet Take 1 tablet (160 mg total) by mouth daily.  90 tablet  3  . [DISCONTINUED] Calcium Carbonate-Vitamin D (CALCIUM 600/VITAMIN D) 600-400 MG-UNIT per tablet Take 1 tablet by mouth 2 (two) times daily.        . [DISCONTINUED] sodium chloride (OCEAN) 0.65 % nasal spray Place 2 sprays into the nose as needed. TO OPEN NASAL BREATHING        Review of Systems System review is negative for any constitutional, cardiac, pulmonary, GI or neuro symptoms or complaints other than as described in the HPI.     Objective:   Physical Exam Filed Vitals:   12/01/12 1033  BP: 134/88  Pulse: 82  Temp: 97.6 F (36.4 C)  Resp: 12   Gen'l - WNWD white woman in no  distress HEENT- C&S clear Cor- 2+ radial, RRR Pulm - normal respirations Neuro - A&O x 3, chronic tremor, normal gait, poor tandem gait.  Assessment & Plan:

## 2012-12-01 NOTE — Patient Instructions (Addendum)
Blood pressure looks good today. You may want to check you blood pressure at home, especially in the AM  Dizziness - what you describe is most like positional vertigo - see handout  For follow up lab for diabetes, cholesterol, anemia and B12 today.  Benign Positional Vertigo Vertigo means you feel like you or your surroundings are moving when they are not. Benign positional vertigo is the most common form of vertigo. Benign means that the cause of your condition is not serious. Benign positional vertigo is more common in older adults. CAUSES   Benign positional vertigo is the result of an upset in the labyrinth system. This is an area in the middle ear that helps control your balance. This may be caused by a viral infection, head injury, or repetitive motion. However, often no specific cause is found. SYMPTOMS   Symptoms of benign positional vertigo occur when you move your head or eyes in different directions. Some of the symptoms may include:  Loss of balance and falls.   Vomiting.   Blurred vision.   Dizziness.   Nausea.   Involuntary eye movements (nystagmus).  DIAGNOSIS   Benign positional vertigo is usually diagnosed by physical exam. If the specific cause of your benign positional vertigo is unknown, your caregiver may perform imaging tests, such as magnetic resonance imaging (MRI) or computed tomography (CT). TREATMENT   Your caregiver may recommend movements or procedures to correct the benign positional vertigo. Medicines such as meclizine, benzodiazepines, and medicines for nausea may be used to treat your symptoms. In rare cases, if your symptoms are caused by certain conditions that affect the inner ear, you may need surgery. HOME CARE INSTRUCTIONS    Follow your caregiver's instructions.   Move slowly. Do not make sudden body or head movements.   Avoid driving.   Avoid operating heavy machinery.   Avoid performing any tasks that would be dangerous to you or others  during a vertigo episode.   Drink enough fluids to keep your urine clear or pale yellow.  SEEK IMMEDIATE MEDICAL CARE IF:    You develop problems with walking, weakness, numbness, or using your arms, hands, or legs.   You have difficulty speaking.   You develop severe headaches.   Your nausea or vomiting continues or gets worse.   You develop visual changes.   Your family or friends notice any behavioral changes.   Your condition gets worse.   You have a fever.   You develop a stiff neck or sensitivity to light.  MAKE SURE YOU:    Understand these instructions.   Will watch your condition.   Will get help right away if you are not doing well or get worse.  Document Released: 08/24/2006 Document Revised: 02/08/2012 Document Reviewed: 08/06/2011 [facility] Patient Information 2013 [place], [place].

## 2012-12-02 NOTE — Assessment & Plan Note (Signed)
Recurrent problem. Reviewed chart - has had normal neuro-imaging. 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  Plan No indication for recurrent neuro-imaging  Patient educations - see AVS

## 2012-12-02 NOTE — Assessment & Plan Note (Signed)
For follow up labs - recommendations to follow

## 2012-12-02 NOTE — Assessment & Plan Note (Signed)
For follow up B12 level with recommendations to follow

## 2012-12-05 ENCOUNTER — Encounter: Payer: Self-pay | Admitting: Internal Medicine

## 2012-12-05 DIAGNOSIS — E785 Hyperlipidemia, unspecified: Secondary | ICD-10-CM

## 2012-12-06 ENCOUNTER — Other Ambulatory Visit: Payer: Self-pay | Admitting: *Deleted

## 2012-12-06 MED ORDER — EZETIMIBE 10 MG PO TABS: 10.0000 mg | ORAL_TABLET | Freq: Every day | ORAL | Status: DC

## 2012-12-09 ENCOUNTER — Ambulatory Visit (INDEPENDENT_AMBULATORY_CARE_PROVIDER_SITE_OTHER): Payer: Medicare HMO

## 2012-12-09 DIAGNOSIS — J309 Allergic rhinitis, unspecified: Secondary | ICD-10-CM

## 2012-12-09 NOTE — Assessment & Plan Note (Signed)
Acute bronchitic exacerbation. Plan Xopenex nebulizer treatment, Depo-Medrol, fluids

## 2012-12-09 NOTE — Assessment & Plan Note (Signed)
Continues allergy vaccine. She appreciates that it helps her in the pollen seasons. We have discussed expectations.

## 2012-12-15 ENCOUNTER — Telehealth: Payer: Self-pay | Admitting: Internal Medicine

## 2012-12-15 MED ORDER — DOXYCYCLINE HYCLATE 100 MG PO CPEP: ORAL_CAPSULE | ORAL | Status: DC

## 2012-12-15 NOTE — Telephone Encounter (Signed)
The pt c/o increased PND, sore throat, dry cough and sinus pressure x 1 week. She has been using her Astelin and Saline nasal spray with little relief. CY, pls advise. Allergies  Allergen Reactions  . Aspirin Itching  . Codeine Itching  . Demerol Itching    Unknown-unconscious.  . Latex Itching  . Lisinopril Itching and Cough    REACTION: causes cough

## 2012-12-15 NOTE — Telephone Encounter (Signed)
Pt says the last time she took Augmentin she seemed to wheeze for an extended period of time while on the medication and after she had finished it and wants to know if CY will give her Doxycycline instead. She would also like an RX for Benadryl capsules to help with her drainage. CY, pls advise. Allergies  Allergen Reactions  . Aspirin Itching  . Codeine Itching  . Demerol Itching    Unknown-unconscious.  . Latex Itching  . Lisinopril Itching and Cough    REACTION: causes cough

## 2012-12-15 NOTE — Telephone Encounter (Signed)
Pt called back again re: same. Kathleen W Perdue  °

## 2012-12-15 NOTE — Telephone Encounter (Signed)
Per CY-okay to give Augmentin 875 mg # 14 tae 1 po bid no refills; have patient use Mucinex DM OTC as well.

## 2012-12-15 NOTE — Telephone Encounter (Signed)
Per CY-okay to change Augmentin to Doxycycline 100 mg #8 take 2 today then 1 daily no refills and Benadryl is OTC.

## 2012-12-15 NOTE — Telephone Encounter (Signed)
Pt aware and wants rx called to [facility] on [address].

## 2012-12-16 ENCOUNTER — Ambulatory Visit (INDEPENDENT_AMBULATORY_CARE_PROVIDER_SITE_OTHER): Payer: Medicare HMO | Admitting: *Deleted

## 2012-12-16 ENCOUNTER — Ambulatory Visit (INDEPENDENT_AMBULATORY_CARE_PROVIDER_SITE_OTHER): Payer: Medicare HMO

## 2012-12-16 ENCOUNTER — Other Ambulatory Visit: Payer: Self-pay | Admitting: Nurse Practitioner

## 2012-12-16 DIAGNOSIS — J309 Allergic rhinitis, unspecified: Secondary | ICD-10-CM

## 2012-12-16 DIAGNOSIS — E538 Deficiency of other specified B group vitamins: Secondary | ICD-10-CM

## 2012-12-16 MED ORDER — CYANOCOBALAMIN 1000 MCG/ML IJ SOLN
1000.0000 ug | Freq: Once | INTRAMUSCULAR | Status: AC
  Administered 2012-12-16: 1000 ug via INTRAMUSCULAR

## 2012-12-23 ENCOUNTER — Ambulatory Visit: Payer: Medicare HMO

## 2012-12-29 ENCOUNTER — Encounter: Payer: Self-pay | Admitting: Internal Medicine

## 2012-12-30 ENCOUNTER — Ambulatory Visit (INDEPENDENT_AMBULATORY_CARE_PROVIDER_SITE_OTHER): Payer: Medicare HMO

## 2012-12-30 DIAGNOSIS — J309 Allergic rhinitis, unspecified: Secondary | ICD-10-CM

## 2013-01-03 ENCOUNTER — Encounter: Payer: Self-pay | Admitting: Internal Medicine

## 2013-01-03 ENCOUNTER — Ambulatory Visit (INDEPENDENT_AMBULATORY_CARE_PROVIDER_SITE_OTHER): Payer: Medicare HMO | Admitting: Internal Medicine

## 2013-01-03 ENCOUNTER — Telehealth: Payer: Self-pay | Admitting: Internal Medicine

## 2013-01-03 VITALS — BP 118/64 | HR 76 | Temp 97.2°F | Resp 10 | Wt 193.0 lb

## 2013-01-03 DIAGNOSIS — R079 Chest pain, unspecified: Secondary | ICD-10-CM

## 2013-01-03 DIAGNOSIS — I10 Essential (primary) hypertension: Secondary | ICD-10-CM

## 2013-01-03 DIAGNOSIS — R42 Dizziness and giddiness: Secondary | ICD-10-CM

## 2013-01-03 MED ORDER — ICAPS LUTEIN-ZEAXANTHIN PO TBCR: 1.0000 | EXTENDED_RELEASE_TABLET | Freq: Two times a day (BID) | ORAL | Status: DC

## 2013-01-03 NOTE — Patient Instructions (Addendum)
The episodes of feeling dizzy, spots in your vision, near syncope sounds like it could be low blood pressure. Plan When you have an episode try to check your blood pressure  Chest discomfort: reviewed your chart - normal dobutamine stress test in September; symptoms do not suggest cardiac cause.  Plan - when you have an episode of chest discomfort try taking a liquid antacid, e.g. Mylanta or Maalox  May also be related to drop in blood pressure.  It is a good idea to take 81 mg aspirin 3 times a week.  Continue all your other medications.

## 2013-01-03 NOTE — Telephone Encounter (Signed)
[facility] Triage Call Report Triage Record Num: [id number] Operator: Lesli Albee Patient Name: [name] Call Date & Time: 01/03/2013 8:02:51AM Patient Phone: [phone] PCP: Illene Regulus Patient Gender: Female PCP Fax : [phone] Patient DOB: [birth year] Practice Name: [facility] Reason for Call: Caller: [name]/Other; PCP: Illene Regulus (Adults only); CB#: [id number]; Call regarding Dizziness; Pt has a hx of vertigo. She had an episode yesterday that was intermittent. None today. When the episode happens she has pains in her chest. Last episode was yesterday. Pt advised for earlier appt today d/t triage but pt only wants to see Dr. Arthur Holms. Appt scheduled at 4pm. Protocol(s) Used: Dizziness or Vertigo Recommended Outcome per Protocol: See Provider within 4 hours Reason for Outcome: Having sensations of turning or spinning that affects balance AND not responsive to 4 hours of home care

## 2013-01-03 NOTE — Progress Notes (Signed)
Subjective:    Patient ID: [name], female    DOB: Sep 09, 1946, 67 y.o.   MRN: [id number]  HPI [name] presents for intermittent episodes of concurrent anterior chest - achy in nature, no radiation, no diaphoresis. Duration up to 1/2 hour. She has not tried any medication to relieve her discomfort. She does get relief when she lays down. Had a dobutamine GXT Sept '13 - negative study. Symptoms are not exertional.  Along with the chest pain she will get light-headed, dysequilibrium, will have dark scotoma, no nausea, no paresthesia, no ringing in the ears. These symptoms will last about 30 minutes No peripheral weakness, no peripheral paresthesia, no focal symptoms  These events are occuring 2-3 times a week.   Past Medical History  Diagnosis Date  . Personal history of colonic polyps   . Iron deficiency anemia, unspecified   . Other malaise and fatigue   . Chest pain, unspecified   . Other B-complex deficiencies   . Arthritis     Right Sacroiliac joint  . Other and unspecified hyperlipidemia   . Anxiety and depression   . HTN (hypertension)   . Unspecified hypothyroidism   . Type II or unspecified type diabetes mellitus without mention of complication, not stated as uncontrolled   . Allergic rhinitis, cause unspecified   . Unspecified asthma   . Chronic airway obstruction, not elsewhere classified   . Abnormal stress test 08/21/2008    Neg  . GERD (gastroesophageal reflux disease)    Past Surgical History  Procedure Date  . Foot surgery     hammer toe 2004, 2005  . Abdominal hysterectomy 1982    menometorrhagia  . Carpal tunnel release 03/05/2009    bilateral   Family History  Problem Relation Age of Onset  . Hypertension Mother   . Diabetes Mother   . Heart disease Mother     CHF  . Coronary artery disease Mother   . Lung cancer Father   . Hypertension Father   . Diabetes Father   . Liver cancer Father     w/mets  . Prostate cancer Father   . Breast  cancer Sister   . Lung disease Brother     Agent Orange Related  . Asthma Brother   . Allergies Brother   . Colon cancer Neg Hx    History   Social History  . Marital Status: Legally Separated    Spouse Name: N/A    Number of Children: 6  . Years of Education: N/A   Occupational History  .     Social History Main Topics  . Smoking status: Former Smoker    Quit date: 06/01/1997  . Smokeless tobacco: Never Used     Comment: Quit 9 years ago.  . Alcohol Use: No  . Drug Use: No  . Sexually Active: Not on file   Other Topics Concern  . Not on file   Social History Narrative   10th gradeMarried - 1965, Divorced after 5 years; married 1971-divorced 2 yrs; married [id number] sons - '68, '71, '79; 3 daughters - '69, '71, '79Grandchildren 10; 2 great-grandchildrenDisability - was a CNA, disability ended with Medicare/retirement. Looking for work but can't find a jobEnvironment: House with crawl space, central air conditioning, hard wood. No feather bedding, no mold. Son smokes. Pets including dogs, cats, 3 birds. Angioedema with Aspirin.Daily caffeine use two cups a dayRegular Exercise-yes    Current Outpatient Prescriptions on File Prior to Visit  Medication Sig Dispense Refill  .  albuterol (PROAIR HFA) 108 (90 BASE) MCG/ACT inhaler Inhale 2 puffs into the lungs every 6 (six) hours as needed. For SOB.  1 Inhaler  5  . azelastine (ASTELIN) 137 MCG/SPRAY nasal spray Place 1 spray into the nose 2 (two) times daily. Use in each nostril as directed  30 mL  1  . budesonide-formoterol (SYMBICORT) 80-4.5 MCG/ACT inhaler Inhale 2 puffs into the lungs 2 (two) times daily.  10.2 g  11  . clonazePAM (KLONOPIN) 1 MG tablet Take 1 tablet (1 mg total) by mouth at bedtime as needed. Anxiety  30 tablet  5  . dicyclomine (BENTYL) 10 MG capsule TAKE ONE CAPSULE BY MOUTH TWICE DAILY FOR CRAMPING AND SPASMS  60 capsule  0  . doxycycline (DORYX) 100 MG DR capsule Take 2 capsules today then 1 daily until gone.   8 capsule  0  . esomeprazole (NEXIUM) 40 MG capsule Take 1 capsule (40 mg total) by mouth daily.  30 capsule  11  . ezetimibe (ZETIA) 10 MG tablet Take 1 tablet (10 mg total) by mouth daily.  90 tablet  1  . ferrous sulfate 325 (65 FE) MG EC tablet Take 325 mg by mouth daily.       . fluocinonide cream (LIDEX) 0.05 % Apply topically 2 (two) times daily.  30 g  1  . furosemide (LASIX) 40 MG tablet Take 1 tablet (40 mg total) by mouth daily. For HTN.  30 tablet  5  . glucose blood test strip .Marland KitchenUse as instructed CONTOUR EXTRA TEST STRIPS , USE AS DIRECTED ONE TIME A DAY,  DIAGNOSIS CODE 250.0.  100 each  6  . hydrocodone-acetaminophen (LORCET-HD) 5-500 MG per capsule Take 1 capsule by mouth every 8 (eight) hours as needed.  90 capsule  2  . insulin glargine (LANTUS) 100 UNIT/ML injection Inject 50 Units into the skin at bedtime.  45 mL  1  . levalbuterol (XOPENEX) 0.63 MG/3ML nebulizer solution Take 3 mLs (0.63 mg total) by nebulization 4 (four) times daily.  50 mL  12  . levothyroxine (SYNTHROID) 88 MCG tablet Take 1 tablet (88 mcg total) by mouth daily.  90 tablet  3  . memantine (NAMENDA) 10 MG tablet Take 1 tablet (10 mg total) by mouth 2 (two) times daily.  60 tablet  11  . metFORMIN (GLUCOPHAGE) 1000 MG tablet Take 1 tablet (1,000 mg total) by mouth 2 (two) times daily with a meal.  180 tablet  1  . Multiple Vitamins-Minerals (CENTRUM SILVER PO) Take 1 tablet by mouth daily.      Marland Kitchen OLANZapine (ZYPREXA) 5 MG tablet Take 5 mg by mouth at bedtime.      Marland Kitchen omega-3 acid ethyl esters (LOVAZA) 1 G capsule Take 1 g by mouth 2 (two) times daily.      . sitaGLIPtin (JANUVIA) 100 MG tablet Take 1 tablet (100 mg total) by mouth daily.  30 tablet  11  . tiotropium (SPIRIVA HANDIHALER) 18 MCG inhalation capsule Place 1 capsule (18 mcg total) into inhaler and inhale daily.  90 capsule  3  . valsartan (DIOVAN) 160 MG tablet Take 1 tablet (160 mg total) by mouth daily.  90 tablet  3  . [DISCONTINUED] Calcium  Carbonate-Vitamin D (CALCIUM 600/VITAMIN D) 600-400 MG-UNIT per tablet Take 1 tablet by mouth 2 (two) times daily.        . [DISCONTINUED] sodium chloride (OCEAN) 0.65 % nasal spray Place 2 sprays into the nose as needed. TO OPEN NASAL  BREATHING          Review of Systems System review is negative for any constitutional, cardiac, pulmonary, GI or neuro symptoms or complaints other than as described in the HPI.     Objective:   Physical Exam Filed Vitals:   01/03/13 1632  BP: 118/64  Pulse: 76  Temp: 97.2 F (36.2 C)  Resp: 10   Wt Readings from Last 3 Encounters:  01/03/13 193 lb 0.6 oz (87.562 kg)  12/01/12 200 lb (90.719 kg)  11/28/12 204 lb (92.534 kg)   Gen'l - overweight older woman in no distress HEENT- C&S clear Cor- 2+ radial, RRR Pulm - normal respirations, no wheezing or rales Neuro - A&O x3, speech clear, cognition at baseline. CN -PERRLA, EOMI, normal facial symmetry and movement. MS - 4/5 and equal throughout. Cerebellar - no tremor, normal gait.       Assessment & Plan:

## 2013-01-05 ENCOUNTER — Other Ambulatory Visit: Payer: Self-pay | Admitting: *Deleted

## 2013-01-05 MED ORDER — HYDROCODONE-ACETAMINOPHEN 5-500 MG PO CAPS: 1.0000 | ORAL_CAPSULE | Freq: Three times a day (TID) | ORAL | Status: DC | PRN

## 2013-01-05 NOTE — Assessment & Plan Note (Signed)
Chest discomfort: reviewed chart - normal dobutamine stress test in September; symptoms do not suggest cardiac cause.  Plan - For episode of chest discomfort try taking a liquid antacid, e.g. Mylanta or Maalox  May also be related to drop in blood pressure.

## 2013-01-05 NOTE — Assessment & Plan Note (Signed)
Patient with repeated episodes of dizziness which she describes as dysequilibrium with minor associated symptoms. She has no focal complaints. Reviewed record: CT brain June '13 - ok, MRI brain July '13 - OK. Non-focal exam. Question whether symptoms are related to intermitent hypotension.  Plan - continue present medications  Check BP during episodes.

## 2013-01-05 NOTE — Assessment & Plan Note (Signed)
BP Readings from Last 3 Encounters:  01/03/13 118/64  12/01/12 134/88  11/28/12 140/82   Question whether some of her symptoms of dysequilibrium and chest discomfort may be due to hypotension.  Plan She is advised to check BP at times when she is having episodes of "dizziness" or weakness.

## 2013-01-06 ENCOUNTER — Ambulatory Visit (INDEPENDENT_AMBULATORY_CARE_PROVIDER_SITE_OTHER): Payer: Medicare HMO

## 2013-01-06 DIAGNOSIS — J309 Allergic rhinitis, unspecified: Secondary | ICD-10-CM

## 2013-01-09 ENCOUNTER — Telehealth: Payer: Self-pay | Admitting: Internal Medicine

## 2013-01-09 NOTE — Telephone Encounter (Signed)
Patient saw a commercial on TV about one of her medications and she would like to speak with someone about the possible side effects

## 2013-01-10 NOTE — Telephone Encounter (Signed)
Please read phone note below and advise. 

## 2013-01-11 ENCOUNTER — Telehealth: Payer: Self-pay | Admitting: Internal Medicine

## 2013-01-11 MED ORDER — DOXYCYCLINE HYCLATE 100 MG PO CPEP: ORAL_CAPSULE | ORAL | Status: DC

## 2013-01-11 NOTE — Telephone Encounter (Signed)
I spoke with pt relative and she pt is c/o cough w/ yellow phlem, PND, chills, sneezing, HA, runny nose, wheezing x 4 days. No fever. She is requesting refill on doxy. Please advise Dr. Maple Hudson thanks Last OV 11/28/12 Allergies  Allergen Reactions  . Aspirin Itching  . Codeine Itching  . Demerol Itching    Unknown-unconscious.  . Latex Itching  . Lisinopril Itching and Cough    REACTION: causes cough

## 2013-01-11 NOTE — Telephone Encounter (Signed)
I spoke with [name] and is aware rx has been sent to the pharmacy. Nothing further was needed.

## 2013-01-11 NOTE — Telephone Encounter (Signed)
Need to know the medication she is concerned about

## 2013-01-11 NOTE — Telephone Encounter (Signed)
Per CY-okay to give Doxycycline 100 mg #8 take 2 today then 1 daily no refills.  

## 2013-01-13 ENCOUNTER — Ambulatory Visit: Payer: Medicare HMO

## 2013-01-13 ENCOUNTER — Other Ambulatory Visit: Payer: Self-pay | Admitting: Nurse Practitioner

## 2013-01-17 ENCOUNTER — Ambulatory Visit (INDEPENDENT_AMBULATORY_CARE_PROVIDER_SITE_OTHER): Payer: Medicare HMO | Admitting: *Deleted

## 2013-01-17 ENCOUNTER — Telehealth: Payer: Self-pay | Admitting: *Deleted

## 2013-01-17 ENCOUNTER — Ambulatory Visit (INDEPENDENT_AMBULATORY_CARE_PROVIDER_SITE_OTHER): Payer: Medicare HMO

## 2013-01-17 DIAGNOSIS — Z23 Encounter for immunization: Secondary | ICD-10-CM

## 2013-01-17 DIAGNOSIS — J309 Allergic rhinitis, unspecified: Secondary | ICD-10-CM

## 2013-01-17 DIAGNOSIS — E538 Deficiency of other specified B group vitamins: Secondary | ICD-10-CM

## 2013-01-17 MED ORDER — CYANOCOBALAMIN 1000 MCG/ML IJ SOLN
1000.0000 ug | Freq: Once | INTRAMUSCULAR | Status: AC
  Administered 2013-01-17: 1000 ug via INTRAMUSCULAR

## 2013-01-17 NOTE — Telephone Encounter (Signed)
See other note

## 2013-01-17 NOTE — Telephone Encounter (Signed)
Pt called back stating that she was also concerned because the commercial for the Januvia states there is a risk of pancreatic cancer and she lost a brother last year to pancreatic cancer. Please advise.

## 2013-01-17 NOTE — Telephone Encounter (Signed)
neglible risk and family history plays no role. May have ov to change therapy if she wants to change

## 2013-01-17 NOTE — Telephone Encounter (Signed)
Spoke with pt. She said that her concern is with Januvia. She is concerned because her daughter in law told her a commercial stated that there is a risk for having a heart attack or stroke while on this medication. Please advise.

## 2013-01-17 NOTE — Telephone Encounter (Signed)
Called pt per Dr Debby Bud and told her there was a neglible risk and family history plays no role. May have ov to change therapy if she wants to change. Pt states she would like to think about it and she will call back if she'd like to schedule an ov to discuss a change.

## 2013-01-21 ENCOUNTER — Emergency Department (HOSPITAL_COMMUNITY): Payer: Medicare HMO

## 2013-01-21 ENCOUNTER — Emergency Department (HOSPITAL_COMMUNITY)
Admission: EM | Admit: 2013-01-21 | Discharge: 2013-01-21 | Disposition: A | Discharge status: Home / Self Care | Payer: Medicare HMO | Attending: Emergency Medicine | Admitting: Emergency Medicine

## 2013-01-21 ENCOUNTER — Encounter (HOSPITAL_COMMUNITY): Payer: Self-pay | Admitting: Emergency Medicine

## 2013-01-21 DIAGNOSIS — E039 Hypothyroidism, unspecified: Secondary | ICD-10-CM | POA: Insufficient documentation

## 2013-01-21 DIAGNOSIS — J4489 Other specified chronic obstructive pulmonary disease: Secondary | ICD-10-CM | POA: Insufficient documentation

## 2013-01-21 DIAGNOSIS — E119 Type 2 diabetes mellitus without complications: Secondary | ICD-10-CM | POA: Insufficient documentation

## 2013-01-21 DIAGNOSIS — M129 Arthropathy, unspecified: Secondary | ICD-10-CM | POA: Insufficient documentation

## 2013-01-21 DIAGNOSIS — E785 Hyperlipidemia, unspecified: Secondary | ICD-10-CM | POA: Insufficient documentation

## 2013-01-21 DIAGNOSIS — R197 Diarrhea, unspecified: Secondary | ICD-10-CM | POA: Insufficient documentation

## 2013-01-21 DIAGNOSIS — D509 Iron deficiency anemia, unspecified: Secondary | ICD-10-CM | POA: Insufficient documentation

## 2013-01-21 DIAGNOSIS — Z79899 Other long term (current) drug therapy: Secondary | ICD-10-CM | POA: Insufficient documentation

## 2013-01-21 DIAGNOSIS — Z794 Long term (current) use of insulin: Secondary | ICD-10-CM | POA: Insufficient documentation

## 2013-01-21 DIAGNOSIS — Z860100 Personal history of colon polyps, unspecified: Secondary | ICD-10-CM | POA: Insufficient documentation

## 2013-01-21 DIAGNOSIS — R112 Nausea with vomiting, unspecified: Secondary | ICD-10-CM | POA: Insufficient documentation

## 2013-01-21 DIAGNOSIS — K219 Gastro-esophageal reflux disease without esophagitis: Secondary | ICD-10-CM | POA: Insufficient documentation

## 2013-01-21 DIAGNOSIS — Z87891 Personal history of nicotine dependence: Secondary | ICD-10-CM | POA: Insufficient documentation

## 2013-01-21 DIAGNOSIS — IMO0002 Reserved for concepts with insufficient information to code with codable children: Secondary | ICD-10-CM | POA: Insufficient documentation

## 2013-01-21 DIAGNOSIS — I10 Essential (primary) hypertension: Secondary | ICD-10-CM | POA: Insufficient documentation

## 2013-01-21 LAB — URINALYSIS, ROUTINE W REFLEX MICROSCOPIC
Glucose, UA: NEGATIVE mg/dL
Ketones, ur: NEGATIVE mg/dL
Leukocytes, UA: NEGATIVE
Protein, ur: NEGATIVE mg/dL
Urobilinogen, UA: 0.2 mg/dL (ref 0.0–1.0)

## 2013-01-21 LAB — CBC WITH DIFFERENTIAL/PLATELET
Basophils Absolute: 0 10*3/uL (ref 0.0–0.1)
Eosinophils Relative: 0 % (ref 0–5)
HCT: 38.7 % (ref 36.0–46.0)
Lymphocytes Relative: 7 % — ABNORMAL LOW (ref 12–46)
Lymphs Abs: 0.7 10*3/uL (ref 0.7–4.0)
MCV: 87.8 fL (ref 78.0–100.0)
Monocytes Absolute: 0.5 10*3/uL (ref 0.1–1.0)
Neutro Abs: 8.3 10*3/uL — ABNORMAL HIGH (ref 1.7–7.7)
RBC: 4.41 MIL/uL (ref 3.87–5.11)
WBC: 9.5 10*3/uL (ref 4.0–10.5)

## 2013-01-21 LAB — COMPREHENSIVE METABOLIC PANEL
Albumin: 3.4 g/dL — ABNORMAL LOW (ref 3.5–5.2)
Alkaline Phosphatase: 63 U/L (ref 39–117)
BUN: 23 mg/dL (ref 6–23)
CO2: 26 mEq/L (ref 19–32)
Chloride: 102 mEq/L (ref 96–112)
Creatinine, Ser: 0.94 mg/dL (ref 0.50–1.10)
GFR calc Af Amer: 71 mL/min — ABNORMAL LOW (ref >90)
GFR calc non Af Amer: 61 mL/min — ABNORMAL LOW (ref >90)
Glucose, Bld: 208 mg/dL — ABNORMAL HIGH (ref 70–99)
Potassium: 3.2 mEq/L — ABNORMAL LOW (ref 3.5–5.1)
Total Bilirubin: 0.8 mg/dL (ref 0.3–1.2)

## 2013-01-21 LAB — OCCULT BLOOD, POC DEVICE: Fecal Occult Bld: POSITIVE — AB

## 2013-01-21 LAB — POCT I-STAT, CHEM 8
BUN: 24 mg/dL — ABNORMAL HIGH (ref 6–23)
Calcium, Ion: 1.24 mmol/L (ref 1.13–1.30)
Hemoglobin: 13.9 g/dL (ref 12.0–15.0)
Sodium: 141 mEq/L (ref 135–145)
TCO2: 27 mmol/L (ref 0–100)

## 2013-01-21 LAB — LIPASE, BLOOD: Lipase: 21 U/L (ref 11–59)

## 2013-01-21 MED ORDER — IOHEXOL 300 MG/ML  SOLN
100.0000 mL | Freq: Once | INTRAMUSCULAR | Status: AC | PRN
  Administered 2013-01-21: 100 mL via INTRAVENOUS

## 2013-01-21 MED ORDER — HYDROMORPHONE HCL PF 1 MG/ML IJ SOLN
1.0000 mg | Freq: Once | INTRAMUSCULAR | Status: AC
  Administered 2013-01-21: 1 mg via INTRAVENOUS
  Filled 2013-01-21: qty 1

## 2013-01-21 MED ORDER — IOHEXOL 300 MG/ML  SOLN
50.0000 mL | Freq: Once | INTRAMUSCULAR | Status: AC | PRN
  Administered 2013-01-21: 50 mL via ORAL

## 2013-01-21 MED ORDER — SODIUM CHLORIDE 0.9 % IV BOLUS (SEPSIS)
1000.0000 mL | Freq: Once | INTRAVENOUS | Status: AC
  Administered 2013-01-21: 1000 mL via INTRAVENOUS

## 2013-01-21 MED ORDER — ONDANSETRON HCL 4 MG PO TABS: 4.0000 mg | ORAL_TABLET | Freq: Four times a day (QID) | ORAL | Status: DC

## 2013-01-21 MED ORDER — ONDANSETRON HCL 4 MG/2ML IJ SOLN
4.0000 mg | Freq: Once | INTRAMUSCULAR | Status: AC
  Administered 2013-01-21: 4 mg via INTRAVENOUS
  Filled 2013-01-21: qty 2

## 2013-01-21 NOTE — ED Notes (Signed)
Patient transported to CT 

## 2013-01-21 NOTE — ED Notes (Signed)
Returned from ct scan 

## 2013-01-21 NOTE — ED Notes (Signed)
The pt has refused to be admitted.  She has signed a ama form and is leaving with her son

## 2013-01-21 NOTE — ED Notes (Signed)
Pt c/o generalized abdominal with  N/V/D onset today. Abdomen distended, soft, tender to touch in lower quadrants.

## 2013-01-21 NOTE — ED Provider Notes (Addendum)
History     CSN: [id number]  Arrival date & time 01/21/13  1559   First MD Initiated Contact with Patient 01/21/13 1625      Chief Complaint  Patient presents with  . Abdominal Pain    (Consider location/radiation/quality/duration/timing/severity/associated sxs/prior treatment) Patient is a 67 y.o. female presenting with abdominal pain.  Abdominal Pain  67 year old female who comes in today complaining of nausea, vomiting, diarrhea, abdominal pain, and weakness. She states that this began at approximately noon today. She has had some chills but has not had fever. She did eat earlier today and states the emesis was mostly food. The stool consisted of loose stool without blood or mucus. She denies any headache, chest pain, dyspnea, or fever. She lives alone in her in her home and had a friend drive her in. Her primary care Dr. is Dr. Deeann Dowse.  Past Medical History  Diagnosis Date  . Personal history of colonic polyps   . Iron deficiency anemia, unspecified   . Other malaise and fatigue   . Chest pain, unspecified   . Other B-complex deficiencies   . Arthritis     Right Sacroiliac joint  . Other and unspecified hyperlipidemia   . Anxiety and depression   . HTN (hypertension)   . Unspecified hypothyroidism   . Type II or unspecified type diabetes mellitus without mention of complication, not stated as uncontrolled   . Allergic rhinitis, cause unspecified   . Unspecified asthma   . Chronic airway obstruction, not elsewhere classified   . Abnormal stress test 08/21/2008    Neg  . GERD (gastroesophageal reflux disease)     Past Surgical History  Procedure Laterality Date  . Foot surgery      hammer toe 2004, 2005  . Abdominal hysterectomy  1982    menometorrhagia  . Carpal tunnel release  03/05/2009    bilateral    Family History  Problem Relation Age of Onset  . Hypertension Mother   . Diabetes Mother   . Heart disease Mother     CHF  . Coronary artery disease  Mother   . Lung cancer Father   . Hypertension Father   . Diabetes Father   . Liver cancer Father     w/mets  . Prostate cancer Father   . Breast cancer Sister   . Lung disease Brother     Agent Orange Related  . Asthma Brother   . Allergies Brother   . Colon cancer Neg Hx     History  Substance Use Topics  . Smoking status: Former Smoker    Quit date: 06/01/1997  . Smokeless tobacco: Never Used     Comment: Quit 9 years ago.  . Alcohol Use: No    OB History   Grav Para Term Preterm Abortions TAB SAB Ect Mult Living                  Review of Systems  Gastrointestinal: Positive for abdominal pain.    Allergies  Aspirin; Codeine; Demerol; Latex; and Lisinopril  Home Medications   Current Outpatient Rx  Name  Route  Sig  Dispense  Refill  . albuterol (PROAIR HFA) 108 (90 BASE) MCG/ACT inhaler   Inhalation   Inhale 2 puffs into the lungs every 6 (six) hours as needed. For SOB.   1 Inhaler   5   . azelastine (ASTELIN) 137 MCG/SPRAY nasal spray   Nasal   Place 1 spray into the nose 2 (two) times daily.  Use in each nostril as directed   30 mL   1     PT NEEDS TO SCHEDULE FOLLOW UP APPT   . budesonide-formoterol (SYMBICORT) 80-4.5 MCG/ACT inhaler   Inhalation   Inhale 2 puffs into the lungs 2 (two) times daily.   10.2 g   11   . clonazePAM (KLONOPIN) 1 MG tablet   Oral   Take 1 tablet (1 mg total) by mouth at bedtime as needed. Anxiety   30 tablet   5   . dicyclomine (BENTYL) 10 MG capsule      TAKE ONE CAPSULE BY MOUTH TWICE DAILY FOR CRAMPING AND SPASMS   60 capsule   0   . doxycycline (DORYX) 100 MG DR capsule      Take 2 capsules today then 1 daily until gone.   8 capsule   0   . esomeprazole (NEXIUM) 40 MG capsule   Oral   Take 1 capsule (40 mg total) by mouth daily.   30 capsule   11   . ezetimibe (ZETIA) 10 MG tablet   Oral   Take 1 tablet (10 mg total) by mouth daily.   90 tablet   1   . ferrous sulfate 325 (65 FE) MG EC  tablet   Oral   Take 325 mg by mouth daily.          . fluocinonide cream (LIDEX) 0.05 %   Topical   Apply topically 2 (two) times daily.   30 g   1   . furosemide (LASIX) 40 MG tablet   Oral   Take 1 tablet (40 mg total) by mouth daily. For HTN.   30 tablet   5   . glucose blood test strip      .Marland KitchenUse as instructed CONTOUR EXTRA TEST STRIPS , USE AS DIRECTED ONE TIME A DAY,  DIAGNOSIS CODE 250.0.   100 each   6   . hydrocodone-acetaminophen (LORCET-HD) 5-500 MG per capsule   Oral   Take 1 capsule by mouth every 8 (eight) hours as needed.   90 capsule   2   . insulin glargine (LANTUS) 100 UNIT/ML injection   Subcutaneous   Inject 50 Units into the skin at bedtime.   45 mL   1   . levalbuterol (XOPENEX) 0.63 MG/3ML nebulizer solution   Nebulization   Take 3 mLs (0.63 mg total) by nebulization 4 (four) times daily.   50 mL   12   . levothyroxine (SYNTHROID) 88 MCG tablet   Oral   Take 1 tablet (88 mcg total) by mouth daily.   90 tablet   3   . memantine (NAMENDA) 10 MG tablet   Oral   Take 1 tablet (10 mg total) by mouth 2 (two) times daily.   60 tablet   11   . metFORMIN (GLUCOPHAGE) 1000 MG tablet   Oral   Take 1 tablet (1,000 mg total) by mouth 2 (two) times daily with a meal.   180 tablet   1   . Multiple Vitamins-Minerals (CENTRUM SILVER PO)   Oral   Take 1 tablet by mouth daily.         Marland Kitchen OLANZapine (ZYPREXA) 5 MG tablet   Oral   Take 5 mg by mouth at bedtime.         Marland Kitchen omega-3 acid ethyl esters (LOVAZA) 1 G capsule   Oral   Take 1 g by mouth 2 (two) times daily.         Marland Kitchen  sitaGLIPtin (JANUVIA) 100 MG tablet   Oral   Take 1 tablet (100 mg total) by mouth daily.   30 tablet   11   . Specialty Vitamins Products (ICAPS LUTEIN-ZEAXANTHIN) TBCR   Oral   Take 1 tablet by mouth 2 (two) times daily.      0   . tiotropium (SPIRIVA HANDIHALER) 18 MCG inhalation capsule   Inhalation   Place 1 capsule (18 mcg total) into inhaler and  inhale daily.   90 capsule   3   . valsartan (DIOVAN) 160 MG tablet   Oral   Take 1 tablet (160 mg total) by mouth daily.   90 tablet   3     BP 83/43  Pulse 85  Temp(Src) 98.4 F (36.9 C) (Oral)  Resp 18  SpO2 96%  Physical Exam  Nursing note and vitals reviewed. Constitutional: She is oriented to person, place, and time. She appears well-developed and well-nourished.  Morbidly obese female  HENT:  Head: Normocephalic and atraumatic.  Right Ear: External ear normal.  Left Ear: External ear normal.  Nose: Nose normal.  Mouth/Throat: Oropharynx is clear and moist.  Eyes: Conjunctivae and EOM are normal. Pupils are equal, round, and reactive to light.  Neck: Normal range of motion. Neck supple.  Cardiovascular: Normal rate, regular rhythm, normal heart sounds and intact distal pulses.   Pulmonary/Chest: Effort normal and breath sounds normal.  Abdominal: Soft. Bowel sounds are normal.  Musculoskeletal: Normal range of motion.  Neurological: She is alert and oriented to person, place, and time.  Skin: Skin is warm and dry.  Psychiatric: She has a normal mood and affect. Her behavior is normal. Thought content normal.    ED Course  Procedures (including critical care time)  Labs Reviewed  CBC WITH DIFFERENTIAL  LIPASE, BLOOD  COMPREHENSIVE METABOLIC PANEL  URINALYSIS, ROUTINE W REFLEX MICROSCOPIC   No results found.   No diagnosis found.  Results for orders placed during the hospital encounter of 01/21/13  CBC WITH DIFFERENTIAL      Result Value Range   WBC 9.5  4.0 - 10.5 K/uL   RBC 4.41  3.87 - 5.11 MIL/uL   Hemoglobin 13.4  12.0 - 15.0 g/dL   HCT 09.8  11.9 - 14.7 %   MCV 87.8  78.0 - 100.0 fL   MCH 30.4  26.0 - 34.0 pg   MCHC 34.6  30.0 - 36.0 g/dL   RDW 82.9  56.2 - 13.0 %   Platelets 211  150 - 400 K/uL   Neutrophils Relative 87 (*) 43 - 77 %   Neutro Abs 8.3 (*) 1.7 - 7.7 K/uL   Lymphocytes Relative 7 (*) 12 - 46 %   Lymphs Abs 0.7  0.7 - 4.0  K/uL   Monocytes Relative 5  3 - 12 %   Monocytes Absolute 0.5  0.1 - 1.0 K/uL   Eosinophils Relative 0  0 - 5 %   Eosinophils Absolute 0.0  0.0 - 0.7 K/uL   Basophils Relative 0  0 - 1 %   Basophils Absolute 0.0  0.0 - 0.1 K/uL  LIPASE, BLOOD      Result Value Range   Lipase 21  11 - 59 U/L  COMPREHENSIVE METABOLIC PANEL      Result Value Range   Sodium 140  135 - 145 mEq/L   Potassium 3.2 (*) 3.5 - 5.1 mEq/L   Chloride 102  96 - 112 mEq/L   CO2 26  19 - 32 mEq/L   Glucose, Bld 208 (*) 70 - 99 mg/dL   BUN 23  6 - 23 mg/dL   Creatinine, Ser 1.61  0.50 - 1.10 mg/dL   Calcium 9.4  8.4 - 09.6 mg/dL   Total Protein 6.6  6.0 - 8.3 g/dL   Albumin 3.4 (*) 3.5 - 5.2 g/dL   AST 15  0 - 37 U/L   ALT 11  0 - 35 U/L   Alkaline Phosphatase 63  39 - 117 U/L   Total Bilirubin 0.8  0.3 - 1.2 mg/dL   GFR calc non Af Amer 61 (*) >90 mL/min   GFR calc Af Amer 71 (*) >90 mL/min  URINALYSIS, ROUTINE W REFLEX MICROSCOPIC      Result Value Range   Color, Urine YELLOW  YELLOW   APPearance CLEAR  CLEAR   Specific Gravity, Urine <1.005 (*) 1.005 - 1.030   pH 5.5  5.0 - 8.0   Glucose, UA NEGATIVE  NEGATIVE mg/dL   Hgb urine dipstick NEGATIVE  NEGATIVE   Bilirubin Urine NEGATIVE  NEGATIVE   Ketones, ur NEGATIVE  NEGATIVE mg/dL   Protein, ur NEGATIVE  NEGATIVE mg/dL   Urobilinogen, UA 0.2  0.0 - 1.0 mg/dL   Nitrite NEGATIVE  NEGATIVE   Leukocytes, UA NEGATIVE  NEGATIVE  POCT I-STAT, CHEM 8      Result Value Range   Sodium 141  135 - 145 mEq/L   Potassium 3.3 (*) 3.5 - 5.1 mEq/L   Chloride 105  96 - 112 mEq/L   BUN 24 (*) 6 - 23 mg/dL   Creatinine, Ser 0.45  0.50 - 1.10 mg/dL   Glucose, Bld 409 (*) 70 - 99 mg/dL   Calcium, Ion 8.11  9.14 - 1.30 mmol/L   TCO2 27  0 - 100 mmol/L   Hemoglobin 13.9  12.0 - 15.0 g/dL   HCT 78.2  95.6 - 21.3 %  OCCULT BLOOD, POC DEVICE      Result Value Range   Fecal Occult Bld POSITIVE (*) NEGATIVE    Date: 01/21/2013  Rate: 78  Rhythm: normal sinus rhythm   QRS Axis: left  Intervals: normal  ST/T Wave abnormalities: normal  Conduction Disutrbances:none  Narrative Interpretation:   Old EKG Reviewed: rate has increased from 59 to 78 nsst changes resolved    MDM  67 y.o. Female with multiple medical problems who presents today with n/v/d and hypotension.  Patient given iv fluids and bp improved but continues with copious stooling and abdominal cramping.  Stool is heme positive but not grossly bloody and hemoglobin stable.        Hilario Quarry, MD 01/21/13 2156   Patient advised to stay in hospital but refuses. She is signing out AGAINST MEDICAL ADVICE. Her son has been present and has also been advised that she should stay. She spoke with the hospitalist who also advised her stay in hospital and has again refused to  Hilario Quarry, MD 01/22/13 1654

## 2013-01-23 ENCOUNTER — Telehealth: Payer: Self-pay | Admitting: Internal Medicine

## 2013-01-23 NOTE — Telephone Encounter (Signed)
The patient called and is hoping to get some understanding on if she can be seen tomorrow.  She states she has a new medicaid card, but is unsure of what type.  She states she does not have 125.00 to pay for the appointment.  Please advise if the patient's appointment needs to be canceled.    Callback - [phone]

## 2013-01-24 NOTE — Telephone Encounter (Signed)
It is my understanding that as a dual eligible patient, [employer], she is covered to see [place]. Confirm with Mr. Alto Denver

## 2013-01-26 ENCOUNTER — Ambulatory Visit (INDEPENDENT_AMBULATORY_CARE_PROVIDER_SITE_OTHER): Payer: Medicaid Other | Admitting: Internal Medicine

## 2013-01-26 ENCOUNTER — Encounter: Payer: Self-pay | Admitting: Internal Medicine

## 2013-01-26 ENCOUNTER — Ambulatory Visit (INDEPENDENT_AMBULATORY_CARE_PROVIDER_SITE_OTHER): Payer: Medicare HMO

## 2013-01-26 VITALS — BP 126/70 | HR 71 | Temp 97.9°F | Resp 10 | Wt 197.0 lb

## 2013-01-26 MED ORDER — INSULIN GLARGINE 100 UNIT/ML ~~LOC~~ SOLN: 50.0000 [IU] | Freq: Every day | SUBCUTANEOUS | Status: DC

## 2013-01-26 NOTE — Patient Instructions (Addendum)
Reviewed records from the ED Feb 22nd: labs were normal including Lipase - the test for pancreatitis. The CT scan was also normal except for minor inflammation that is seen with viral gastroenteritis. The pancrease appeared normal.  Searched the FDA site and the manufacturer's site: NO relation to pancreatic cancer. There is a risk of pancreatitis (rare) especially in people with gallstone, alcohol abuse, very high triglycerides (which you don't) have.  Recommendation: continue with your present treatment: metfomin twice a day, Lantus at bedtime and Januvia once a day. On this regimen your A1c was 7.3% in January 2014 - very good control.

## 2013-01-28 NOTE — Progress Notes (Signed)
Subjective:    Patient ID: [name], female    DOB: 10-03-1946, 67 y.o.   MRN: [id number]  HPI Mrs. Galvis presents to discuss her diabetes management. She has seen TV ads noting the risks associated with Januvia. Her friends told her this meant a risk of pancreatic cancer.  PMH, FamHx and SocHx reviewed for any changes and relevance.  Current Outpatient Prescriptions on File Prior to Visit  Medication Sig Dispense Refill  . albuterol (PROAIR HFA) 108 (90 BASE) MCG/ACT inhaler Inhale 2 puffs into the lungs every 6 (six) hours as needed. For SOB.  1 Inhaler  5  . azelastine (ASTELIN) 137 MCG/SPRAY nasal spray Place 1 spray into the nose 2 (two) times daily. Use in each nostril as directed  30 mL  1  . budesonide-formoterol (SYMBICORT) 80-4.5 MCG/ACT inhaler Inhale 2 puffs into the lungs 2 (two) times daily.  10.2 g  11  . clonazePAM (KLONOPIN) 1 MG tablet Take 1 mg by mouth at bedtime.      . dicyclomine (BENTYL) 10 MG capsule Take 10 mg by mouth 2 (two) times daily.      Marland Kitchen doxycycline (DORYX) 100 MG DR capsule Take 2 capsules today then 1 daily until gone.  8 capsule  0  . esomeprazole (NEXIUM) 40 MG capsule Take 1 capsule (40 mg total) by mouth daily.  30 capsule  11  . ezetimibe (ZETIA) 10 MG tablet Take 1 tablet (10 mg total) by mouth daily.  90 tablet  1  . ferrous sulfate 325 (65 FE) MG EC tablet Take 325 mg by mouth daily.       . fluocinonide cream (LIDEX) 0.05 % Apply 1 application topically 2 (two) times daily as needed. For dry skin      . furosemide (LASIX) 40 MG tablet Take 1 tablet (40 mg total) by mouth daily. For HTN.  30 tablet  5  . glucose blood test strip .Marland KitchenUse as instructed CONTOUR EXTRA TEST STRIPS , USE AS DIRECTED ONE TIME A DAY,  DIAGNOSIS CODE 250.0.  100 each  6  . hydrocodone-acetaminophen (LORCET-HD) 5-500 MG per capsule Take 1 capsule by mouth every 8 (eight) hours as needed. For pain      . levalbuterol (XOPENEX) 0.63 MG/3ML nebulizer solution Take 1  ampule by nebulization every 6 (six) hours as needed. For wheezing      . levothyroxine (SYNTHROID) 88 MCG tablet Take 1 tablet (88 mcg total) by mouth daily.  90 tablet  3  . memantine (NAMENDA) 10 MG tablet Take 1 tablet (10 mg total) by mouth 2 (two) times daily.  60 tablet  11  . metFORMIN (GLUCOPHAGE) 1000 MG tablet Take 1 tablet (1,000 mg total) by mouth 2 (two) times daily with a meal.  180 tablet  1  . Multiple Vitamins-Minerals (CENTRUM SILVER PO) Take 1 tablet by mouth daily.      Marland Kitchen OLANZapine (ZYPREXA) 5 MG tablet Take 5 mg by mouth at bedtime.      Marland Kitchen omega-3 acid ethyl esters (LOVAZA) 1 G capsule Take 1 g by mouth 2 (two) times daily.      . ondansetron (ZOFRAN) 4 MG tablet Take 1 tablet (4 mg total) by mouth every 6 (six) hours.  12 tablet  0  . sitaGLIPtin (JANUVIA) 100 MG tablet Take 1 tablet (100 mg total) by mouth daily.  30 tablet  11  . Specialty Vitamins Products (ICAPS LUTEIN-ZEAXANTHIN) TBCR Take 1 tablet by mouth daily.      Marland Kitchen  tiotropium (SPIRIVA HANDIHALER) 18 MCG inhalation capsule Place 1 capsule (18 mcg total) into inhaler and inhale daily.  90 capsule  3  . valsartan (DIOVAN) 160 MG tablet Take 1 tablet (160 mg total) by mouth daily.  90 tablet  3  . [DISCONTINUED] Calcium Carbonate-Vitamin D (CALCIUM 600/VITAMIN D) 600-400 MG-UNIT per tablet Take 1 tablet by mouth 2 (two) times daily.        . [DISCONTINUED] sodium chloride (OCEAN) 0.65 % nasal spray Place 2 sprays into the nose as needed. TO OPEN NASAL BREATHING       No current facility-administered medications on file prior to visit.      Review of Systems  System review is negative for any constitutional, cardiac, pulmonary, GI or neuro symptoms or complaints other than as described in the HPI.  Physical Exam: Filed Vitals:   01/26/13 1522  BP: 126/70  Pulse: 71  Temp: 97.9 F (36.6 C)  Resp: 10   Gen'l- overweight white woman in no distress Neuro - A&O x 3        [employer]  IM (o) [phone]; (c) [phone] Call-grp - [facility] IM  Tele: [phone]  01/28/2013, 5:06 PM       Objective:   Physical Exam        Assessment & Plan:

## 2013-01-28 NOTE — Assessment & Plan Note (Signed)
Reviewed FDA web site for drug advisory related to [place] - no contra indications, black box for cancer risk. Reviewed manufacturers information page on side effects. Compared to all othe DPP4 drugs.   Plan Continue present medical regimen which is working with adequate A1C.  (greater than 50% of  30 visit spent on education and counseling)

## 2013-01-30 ENCOUNTER — Telehealth: Payer: Self-pay | Admitting: Internal Medicine

## 2013-01-30 NOTE — Telephone Encounter (Signed)
Clonazepam is not a sleep medication, it is for anxiety. Will not change already high dose for age.   For sleep may try tylenol PM

## 2013-01-30 NOTE — Telephone Encounter (Signed)
Patient Information:  Caller Name: Martie Lee -. Daughter in J[place]  Phone: [phone]  Patient: [name]  Gender: Female  DOB: 1946/05/31  Age: 67 Years  PCP: Illene Regulus (Adults only)  Office Follow Up:  Does the office need to follow up with this patient?: Yes  Instructions For The Office: Triages to be seen in 2 weeks.  Requesting increase in Klonopin dosage. Please contact patient  RN Note:  Pharmacy on [place].  Requesting increase in medication for sleep.  Home care instructions and call back parameters reviewed.  Symptoms  Reason For Call & Symptoms: Daughter in law ,Martie Lee, is calling on behalf of the patient. She states that her Klonopin dosage is needing to be increased.  Klonopin 1mg  at h.s..  She reports that she is waking up 1:00 a.m. and not able to fall back asleep  Reviewed Health History In EMR: Yes  Reviewed Medications In EMR: Yes  Reviewed Allergies In EMR: Yes  Reviewed Surgeries / Procedures: No  Date of Onset of Symptoms: 01/16/2013  Guideline(s) Used:  Insomnia  Disposition Per Guideline:   See Within 2 Weeks in Office  Reason For Disposition Reached:   Insomnia is an ongoing problem (> 2 weeks)  Advice Given:  Reassurance:  Many people have difficulty sleeping.  There are a number of tips for healthy sleep that may help you.  Tips For Good Sleep:  Use your bed only for sleeping and sex. Do not use your bed for eating, reading or watching TV.  Regular exercise is good for your body and helps you sleep. Do not exercise right before bedtime.  Drink a small glass of warm milk at bedtime.  Take a warm bath or shower before bedtime.  Try to go to bed at the same time each night. More importantly, get up at the same time each morning (don't sleep in).  Tips For Good Sleep - Your Bedroom:  Keep bedroom temperature cool, not warm or cold.  Keep bedroom quiet and dark.  Use a comfortable mattress.  Tips For Good Sleep - When You Can  If you  can't fall asleep after 30 minutes, get out of bed and do something relaxing.  Read a book or listen to some soothing music.  When you feel sleepy, go back to bed.  Repeat these steps as needed.  Tips For Good Sleep - What To Avoid:  Avoid caffeine within 6 hours of bedtime.  Avoid alcohol within 4 hours of bedtime.  Avoid nicotine within 2 hours of bedtime (e.g., cigarettes).  Avoid heavy meals just before bedtime. Eating a light snack is OK.  Do not drink over 1 glass of liquid at bedtime (Reason: will need to get up to urinate).  Call Back If:  Insomnia symptoms persist over 2 weeks  You become worse.

## 2013-01-31 NOTE — Telephone Encounter (Signed)
Pt's daughter-in-law Martie Lee informed and expressed understanding.

## 2013-02-06 ENCOUNTER — Ambulatory Visit (INDEPENDENT_AMBULATORY_CARE_PROVIDER_SITE_OTHER): Payer: Medicare HMO

## 2013-02-06 ENCOUNTER — Telehealth: Payer: Self-pay

## 2013-02-06 DIAGNOSIS — J309 Allergic rhinitis, unspecified: Secondary | ICD-10-CM

## 2013-02-06 MED ORDER — HYDROCODONE-ACETAMINOPHEN 5-325 MG PO TABS: 1.0000 | ORAL_TABLET | Freq: Three times a day (TID) | ORAL | Status: DC | PRN

## 2013-02-06 NOTE — Telephone Encounter (Signed)
Rx called in, pt informed

## 2013-02-06 NOTE — Telephone Encounter (Signed)
Pt's daughter in las called stating that pt's current dose of Hydrocodone is no longer available (500 mg of tylenol is too high). Pt's daughter is requesting an alternative dosage

## 2013-02-06 NOTE — Telephone Encounter (Signed)
May change to product with 5/325 APAP- same sig, same count

## 2013-02-09 ENCOUNTER — Other Ambulatory Visit: Payer: Self-pay | Admitting: Nurse Practitioner

## 2013-02-10 ENCOUNTER — Telehealth: Payer: Self-pay | Admitting: Internal Medicine

## 2013-02-10 MED ORDER — AMOXICILLIN-POT CLAVULANATE 875-125 MG PO TABS: 1.0000 | ORAL_TABLET | Freq: Two times a day (BID) | ORAL | Status: DC

## 2013-02-10 NOTE — Telephone Encounter (Signed)
I spoke with pt and is aware of CDY recs. RX has been sent to the pharmacy. Nothing further was needed

## 2013-02-10 NOTE — Telephone Encounter (Signed)
Per CY-give patient Augmentin 875 mg #20 take 1 po BID no refills.

## 2013-02-10 NOTE — Telephone Encounter (Signed)
Spoke with patient, she c/o having a sinus infection and congestion x 2-3 weeks. Denies any f/c/s. States she has been taking the generic mucinex with no improvement.  Patient requesting recs from Dr. Maple Hudson, please advise, thank you!  Last OV:11/28/12 Next OV: 05/29/13  Allergies  Allergen Reactions  . Aspirin Itching  . Codeine Itching  . Demerol Itching    Unknown-unconscious.  . Latex Itching  . Lisinopril Itching and Cough    REACTION: causes cough

## 2013-02-15 ENCOUNTER — Other Ambulatory Visit: Payer: Self-pay

## 2013-02-15 MED ORDER — GLUCOSE BLOOD VI STRP: ORAL_STRIP | Status: DC

## 2013-02-16 ENCOUNTER — Ambulatory Visit (INDEPENDENT_AMBULATORY_CARE_PROVIDER_SITE_OTHER): Payer: Medicare HMO

## 2013-02-24 ENCOUNTER — Ambulatory Visit (INDEPENDENT_AMBULATORY_CARE_PROVIDER_SITE_OTHER): Payer: Medicare HMO

## 2013-02-24 DIAGNOSIS — J309 Allergic rhinitis, unspecified: Secondary | ICD-10-CM

## 2013-03-01 ENCOUNTER — Other Ambulatory Visit: Payer: Self-pay

## 2013-03-01 ENCOUNTER — Ambulatory Visit (INDEPENDENT_AMBULATORY_CARE_PROVIDER_SITE_OTHER): Payer: Medicare Other

## 2013-03-01 ENCOUNTER — Ambulatory Visit (INDEPENDENT_AMBULATORY_CARE_PROVIDER_SITE_OTHER)
Admission: RE | Admit: 2013-03-01 | Discharge: 2013-03-01 | Disposition: A | Discharge status: Home / Self Care | Payer: Medicaid Other | Source: Ambulatory Visit | Attending: Internal Medicine | Admitting: Internal Medicine

## 2013-03-01 ENCOUNTER — Telehealth: Payer: Self-pay | Admitting: Internal Medicine

## 2013-03-01 DIAGNOSIS — J209 Acute bronchitis, unspecified: Secondary | ICD-10-CM

## 2013-03-01 DIAGNOSIS — J309 Allergic rhinitis, unspecified: Secondary | ICD-10-CM

## 2013-03-01 MED ORDER — AMOXICILLIN-POT CLAVULANATE 875-125 MG PO TABS: 1.0000 | ORAL_TABLET | Freq: Two times a day (BID) | ORAL | Status: DC

## 2013-03-01 MED ORDER — GLUCOSE BLOOD VI STRP: ORAL_STRIP | Status: DC

## 2013-03-01 NOTE — Telephone Encounter (Signed)
Spoke with patient patient has two issues: 1. Patient c/o chest infection with prod cough with yellow mucus  x4 days. Reports having some SOB, fever and chills. Has only taken mucinex this morning for symptoms.  Requesting antibiotic sent in. Stated she had Augmentin 875 #20 1 BID #20  last time (02/10/13 via telephone note ) and this seemed to help.   2. Patient also c/o pain at both ribs when she breathes. States is doesn't have to be a deep breath, just a normal breath at all time-- and it hurts.  Patient unable to decipher if this was dull pain or sharp pain just classified it as "arthritis like pain." This has been occuring x2 weeks.  Dr. Maple Hudson please advise, thank you  [facility]: [facility]  Last OV: 11/28/13 w 6 mo f/u Next OV: 05/29/13  Allergies  Allergen Reactions  . Aspirin Itching  . Codeine Itching  . Demerol Itching    Unknown-unconscious.  . Latex Itching  . Lisinopril Itching and Cough    REACTION: causes cough

## 2013-03-01 NOTE — Telephone Encounter (Signed)
Spoke with pt and advised that Dr Maple Hudson is precsribing another round of Augmentin and this was sent to Pharmacy.  Pt will come tomorrow for cxr.

## 2013-03-01 NOTE — Telephone Encounter (Signed)
Per CY-okay to repeat Augmentin 875 mg #20 take 1 po BID no refills, see if she will come by office for CXR Dx Acute Bronchitis and Bilateral Chest wall pain. Thanks.

## 2013-03-09 ENCOUNTER — Other Ambulatory Visit: Payer: Self-pay | Admitting: Nurse Practitioner

## 2013-03-09 NOTE — Telephone Encounter (Signed)
I called the patient to notify her that we did send for her refill on the Bentyl.  I asked if she was having problems.  She said now and then she has cramping.  I told her before another refill she really needs to see either Dr. Arlyce Dice or Willette Cluster NP.  She asked if either of them had an opening on 05-16-2013. She has an appointment with Dr. Debby Bud that day and she has to rely someone to bring her.  I looked at [facility] schedule and he will be in the [facility] that day.  Paula's schedule is not in yet. I told her I would set a reminder flag for myself and call her when the extenders schedule is in.  I told her to call me sooner if she has any worsening problems.

## 2013-03-10 ENCOUNTER — Other Ambulatory Visit: Payer: Self-pay

## 2013-03-10 ENCOUNTER — Ambulatory Visit (INDEPENDENT_AMBULATORY_CARE_PROVIDER_SITE_OTHER): Payer: Medicare Other

## 2013-03-10 DIAGNOSIS — J309 Allergic rhinitis, unspecified: Secondary | ICD-10-CM

## 2013-03-10 MED ORDER — AZELASTINE HCL 0.1 % NA SOLN: NASAL | Status: DC

## 2013-03-21 ENCOUNTER — Telehealth: Payer: Self-pay | Admitting: Internal Medicine

## 2013-03-21 MED ORDER — DOXYCYCLINE HYCLATE 100 MG PO CPEP: ORAL_CAPSULE | ORAL | Status: DC

## 2013-03-21 NOTE — Telephone Encounter (Signed)
Per CY--  Ok to refill the doxy.   Called and spoke with pt and she is aware that this rx has been sent in to the pharmacy. Nothing further is needed.

## 2013-03-21 NOTE — Telephone Encounter (Signed)
Called spoke with patient who c/o HA, sneezing, head congestion with green/clear mucus, PND, wheezing and some increased SOB, chest tightness at bedtime, body aches/chills x3-4days.  Requesting another round of doxycycline.  Received doxycycline for similar symptoms 2.12.14 Recently finished two rounds of augmentin 3.14.14 and 4.2.14 for COPD exacerbation CY w/ no openings this week TP w/ no openings until 4.24.14 Pharmacy: [facility]  Last OV: 11/28/13 w 6 mo f/u  Next OV: 05/29/13  Dr Maple Hudson please advise, thanks!  Allergies  Allergen Reactions  . Aspirin Itching  . Codeine Itching  . Demerol Itching    Unknown-unconscious.  . Latex Itching  . Lisinopril Itching and Cough    REACTION: causes cough

## 2013-03-22 ENCOUNTER — Other Ambulatory Visit: Payer: Self-pay

## 2013-03-22 MED ORDER — INSULIN GLARGINE 100 UNIT/ML ~~LOC~~ SOLN: 50.0000 [IU] | Freq: Every day | SUBCUTANEOUS | Status: DC

## 2013-03-24 ENCOUNTER — Ambulatory Visit (INDEPENDENT_AMBULATORY_CARE_PROVIDER_SITE_OTHER): Payer: Medicare Other

## 2013-03-24 DIAGNOSIS — J309 Allergic rhinitis, unspecified: Secondary | ICD-10-CM

## 2013-03-31 ENCOUNTER — Telehealth: Payer: Self-pay | Admitting: Internal Medicine

## 2013-03-31 ENCOUNTER — Ambulatory Visit (INDEPENDENT_AMBULATORY_CARE_PROVIDER_SITE_OTHER): Payer: Medicare Other

## 2013-03-31 DIAGNOSIS — J309 Allergic rhinitis, unspecified: Secondary | ICD-10-CM

## 2013-03-31 MED ORDER — DOXYCYCLINE HYCLATE 100 MG PO CAPS: ORAL_CAPSULE | ORAL | Status: DC

## 2013-03-31 NOTE — Telephone Encounter (Signed)
I spoke with pt. She c/o nasal congestion, fever of 101, wheezing, chest tx, facial pressure, HA x yesterday. No cough, n/v/ no increase SOB. Pt is requesting another round of doxy. Pt stated last time she received the tablets. i advised her according to our records we did send in capsules for her. Please advise Dr. Maple Hudson thanks Last OV 11/28/12 Pending 05/29/13 Allergies  Allergen Reactions  . Aspirin Itching  . Codeine Itching  . Demerol Itching    Unknown-unconscious.  . Latex Itching  . Lisinopril Itching and Cough    REACTION: causes cough

## 2013-03-31 NOTE — Telephone Encounter (Signed)
Pt aware rx has been sent 

## 2013-03-31 NOTE — Telephone Encounter (Signed)
Per CY-okay to give another rd of Doxycycline and send for the capsules. Thanks.

## 2013-04-03 ENCOUNTER — Telehealth: Payer: Self-pay

## 2013-04-03 NOTE — Telephone Encounter (Signed)
Received a form from [facility] that pt's insurance plan [employer] Dual complete (HMO SNP) has provided pt with a temporary supply of Astelin Nasal spray 137 mcg since within the first 90 days of coverage this plan year. Per Dr Felicity Coyer okay to change to Azelastine (tier 3) instead of initiating prior authorization for Astelin. This information has been scanned in pt's chart

## 2013-04-06 ENCOUNTER — Telehealth: Payer: Self-pay | Admitting: Internal Medicine

## 2013-04-06 NOTE — Telephone Encounter (Addendum)
Pt states that she has been trying to have her Astelin covered by Medicare.  Pt states that she was denied the coverage and they state that they sent our office a denial letter and she is wanting to know why it was denied. When i looked back in chart it looks as though the pts PCP office has been handling this claim and they rec change to generic form Astelin. Pt advised to call PCP and speak with them about this issue.

## 2013-04-13 ENCOUNTER — Other Ambulatory Visit: Payer: Self-pay

## 2013-04-13 MED ORDER — METFORMIN HCL 1000 MG PO TABS: 1000.0000 mg | ORAL_TABLET | Freq: Two times a day (BID) | ORAL | Status: DC

## 2013-04-20 ENCOUNTER — Ambulatory Visit (INDEPENDENT_AMBULATORY_CARE_PROVIDER_SITE_OTHER): Payer: Medicare Other

## 2013-04-20 ENCOUNTER — Ambulatory Visit: Payer: Medicare HMO | Admitting: Internal Medicine

## 2013-04-20 DIAGNOSIS — J309 Allergic rhinitis, unspecified: Secondary | ICD-10-CM

## 2013-04-22 ENCOUNTER — Other Ambulatory Visit: Payer: Self-pay | Admitting: Nurse Practitioner

## 2013-04-25 ENCOUNTER — Telehealth: Payer: Self-pay | Admitting: *Deleted

## 2013-04-25 NOTE — Telephone Encounter (Signed)
I called the patient to advise per Willette Cluster ACNP, that we can refill the Bentyl 10 mg and she needs to see Dr. Arlyce Dice .  Her last office visit was 05/2012 with Willette Cluster ACNP..  I made an appointment with Dr. Arlyce Dice for her on 06-05-2013 at 9:45 Am.  The patient was in agreement with the appointment.  I sent the refill to [facility] [address].

## 2013-04-28 ENCOUNTER — Ambulatory Visit: Payer: Medicare Other

## 2013-04-29 ENCOUNTER — Other Ambulatory Visit: Payer: Self-pay | Admitting: Internal Medicine

## 2013-05-01 ENCOUNTER — Telehealth: Payer: Self-pay | Admitting: Internal Medicine

## 2013-05-01 NOTE — Telephone Encounter (Signed)
TP is not in the office. Please advise thanks

## 2013-05-01 NOTE — Telephone Encounter (Signed)
Per CY-let's bring patient in to see TP.

## 2013-05-01 NOTE — Telephone Encounter (Signed)
Spoke with pt and notified of recs per CDY She verbalized understanding

## 2013-05-01 NOTE — Telephone Encounter (Signed)
Returning call can be reached at [phone].[name]

## 2013-05-01 NOTE — Telephone Encounter (Signed)
ATC NA WCB 

## 2013-05-01 NOTE — Telephone Encounter (Signed)
Spoke with patient,  Patient is scheduled to see Dr. Arthur Holms Wed June 11 @10am  Patient is requesting to also be seen by Dr. Maple Hudson around 11-12 pm on same day after Dr. Debby Bud appt  Patient has appt scheduled for 05/29/13 but states she is having pain at rib she would like Dr.Young to look at Patient states she has difficulty getting to our office and if she could come to both on same day that would help. I have advised patient Dr. Sinclair Ship schedule is full, but would forward message to Dr. Maple Hudson to see if this could be done. Dr. Maple Hudson please advise if patient can be worked into schedule. Thank you  Last OV:11/28/13 Next OV:05/29/13

## 2013-05-01 NOTE — Telephone Encounter (Signed)
Per CY-pt can see Dr Arthur Holms' for the rib pain and keep 05-29-13 appt with [place].

## 2013-05-02 NOTE — Telephone Encounter (Signed)
Hydrocodone called to pharmacy  

## 2013-05-04 ENCOUNTER — Other Ambulatory Visit: Payer: Self-pay | Admitting: Internal Medicine

## 2013-05-05 ENCOUNTER — Ambulatory Visit (INDEPENDENT_AMBULATORY_CARE_PROVIDER_SITE_OTHER): Payer: Medicare Other

## 2013-05-05 DIAGNOSIS — J309 Allergic rhinitis, unspecified: Secondary | ICD-10-CM

## 2013-05-05 NOTE — Telephone Encounter (Signed)
Clonazepam sent to pharmacy 

## 2013-05-10 ENCOUNTER — Ambulatory Visit (INDEPENDENT_AMBULATORY_CARE_PROVIDER_SITE_OTHER): Payer: Medicare Other | Admitting: Internal Medicine

## 2013-05-10 ENCOUNTER — Encounter: Payer: Self-pay | Admitting: Internal Medicine

## 2013-05-10 ENCOUNTER — Other Ambulatory Visit (INDEPENDENT_AMBULATORY_CARE_PROVIDER_SITE_OTHER): Payer: Medicare Other

## 2013-05-10 ENCOUNTER — Ambulatory Visit (INDEPENDENT_AMBULATORY_CARE_PROVIDER_SITE_OTHER): Payer: Medicare Other

## 2013-05-10 VITALS — BP 138/58 | HR 55 | Temp 97.7°F | Resp 12 | Ht 63.5 in | Wt 189.4 lb

## 2013-05-10 DIAGNOSIS — E785 Hyperlipidemia, unspecified: Secondary | ICD-10-CM

## 2013-05-10 DIAGNOSIS — D509 Iron deficiency anemia, unspecified: Secondary | ICD-10-CM

## 2013-05-10 DIAGNOSIS — G309 Alzheimer's disease, unspecified: Secondary | ICD-10-CM

## 2013-05-10 DIAGNOSIS — J309 Allergic rhinitis, unspecified: Secondary | ICD-10-CM

## 2013-05-10 DIAGNOSIS — E538 Deficiency of other specified B group vitamins: Secondary | ICD-10-CM

## 2013-05-10 DIAGNOSIS — E119 Type 2 diabetes mellitus without complications: Secondary | ICD-10-CM

## 2013-05-10 DIAGNOSIS — I10 Essential (primary) hypertension: Secondary | ICD-10-CM

## 2013-05-10 DIAGNOSIS — F0281 Dementia in other diseases classified elsewhere with behavioral disturbance: Secondary | ICD-10-CM

## 2013-05-10 DIAGNOSIS — F0280 Dementia in other diseases classified elsewhere without behavioral disturbance: Secondary | ICD-10-CM

## 2013-05-10 DIAGNOSIS — E039 Hypothyroidism, unspecified: Secondary | ICD-10-CM

## 2013-05-10 DIAGNOSIS — J449 Chronic obstructive pulmonary disease, unspecified: Secondary | ICD-10-CM

## 2013-05-10 DIAGNOSIS — Z0000 Encounter for general adult medical examination without abnormal findings: Secondary | ICD-10-CM

## 2013-05-10 LAB — LIPID PANEL
LDL Cholesterol: 115 mg/dL — ABNORMAL HIGH (ref 0–99)
Total CHOL/HDL Ratio: 4
Triglycerides: 104 mg/dL (ref 0.0–149.0)
VLDL: 20.8 mg/dL (ref 0.0–40.0)

## 2013-05-10 LAB — MICROALBUMIN / CREATININE URINE RATIO: Creatinine,U: 231.8 mg/dL

## 2013-05-10 NOTE — Assessment & Plan Note (Signed)
Appears stable without clear signs of progression. She does report any hallucination or abnormal thought patters.  Plan   continue all meds  Check meds at home - is she taking zyprexa

## 2013-05-10 NOTE — Assessment & Plan Note (Signed)
A1C reveals fabulous control.  Plan Continue present regimen

## 2013-05-10 NOTE — Assessment & Plan Note (Signed)
Lab Results  Component Value Date   HGB 13.9 01/21/2013   Normal Hgb

## 2013-05-10 NOTE — Assessment & Plan Note (Signed)
Interval history - no major illness or problems. Physical exam, sans breast and pelvic, is OK except for weight. Labs are excellent. She is current with colorectal and breast cancer screening. Immunizations are up to date.  In summary - [name] is doing very well and is medically stable. She will need to return in 6 months for interval exam.

## 2013-05-10 NOTE — Assessment & Plan Note (Signed)
Lab Results  Component Value Date   TSH 3.57 12/01/2012   Good control - due for follow up lab Jan '15

## 2013-05-10 NOTE — Progress Notes (Signed)
Subjective:    Patient ID: [name], female    DOB: 1946/08/29, 67 y.o.   MRN: [id number]  HPI The patient is here for annual Medicare wellness examination and management of other chronic and acute problems. She does not have any specific complaints at this time except for knots on her chest and pain at the costo-sternal junction left.    The risk factors are reflected in the social history.  The roster of all physicians providing medical care to patient - is listed in the Snapshot section of the chart.  Activities of daily living:  The patient is 100% inedpendent in all ADLs: dressing, toileting, feeding as well as independent mobility  Home safety : The patient has smoke detectors in the home. Falls - no falls in the last six months. They wear seatbelts. No firearms at home. There is no violence in the home.   There is no risks for hepatitis, STDs or HIV. There is no history of blood transfusion. They have no travel history to infectious disease endemic areas of the world.  The patient has not seen their dentist in the last six month - has full dentures that fit well. They have not seen their eye doctor in the last year. They deny any hearing difficulty and have not had audiologic testing in the last year.  They do not  have excessive sun exposure. Discussed the need for sun protection: hats, long sleeves and use of sunscreen if there is significant sun exposure.   Diet: the importance of a healthy diet is discussed. They do have a healthy, diabetic diet.  The patient has no regular exercise program.  The benefits of regular aerobic exercise were discussed.  Depression screen: there are no signs or vegative symptoms of depression- irritability, change in appetite, anhedonia, sadness/tearfullness.  Cognitive assessment: the patient manages all their financial and personal affairs and is actively engaged. She has had MMSE in the past - fair performance. She is forgetful.  The  following portions of the patient's history were reviewed and updated as appropriate: allergies, current medications, past family history, past medical history,  past surgical history, past social history  and problem list.  Vision, hearing, body mass index were assessed and reviewed.   During the course of the visit the patient was educated and counseled about appropriate screening and preventive services including : fall prevention , diabetes screening, nutrition counseling, colorectal cancer screening, and recommended immunizations.  Past Medical History  Diagnosis Date  . Personal history of colonic polyps   . Iron deficiency anemia, unspecified   . Other malaise and fatigue   . Chest pain, unspecified   . Other B-complex deficiencies   . Arthritis     Right Sacroiliac joint  . Other and unspecified hyperlipidemia   . Anxiety and depression   . HTN (hypertension)   . Unspecified hypothyroidism   . Type II or unspecified type diabetes mellitus without mention of complication, not stated as uncontrolled   . Allergic rhinitis, cause unspecified   . Unspecified asthma(493.90)   . Chronic airway obstruction, not elsewhere classified   . Abnormal stress test 08/21/2008    Neg  . GERD (gastroesophageal reflux disease)    Past Surgical History  Procedure Laterality Date  . Foot surgery      hammer toe 2004, 2005  . Abdominal hysterectomy  1982    menometorrhagia  . Carpal tunnel release  03/05/2009    bilateral   Family History  Problem Relation  Age of Onset  . Hypertension Mother   . Diabetes Mother   . Heart disease Mother     CHF  . Coronary artery disease Mother   . Lung cancer Father   . Hypertension Father   . Diabetes Father   . Liver cancer Father     w/mets  . Prostate cancer Father   . Breast cancer Sister   . Lung disease Brother     Agent Orange Related  . Asthma Brother   . Allergies Brother   . Colon cancer Neg Hx    History   Social History  .  Marital Status: Legally Separated    Spouse Name: N/A    Number of Children: 6  . Years of Education: N/A   Occupational History  .     Social History Main Topics  . Smoking status: Former Smoker    Quit date: 06/01/1997  . Smokeless tobacco: Never Used     Comment: Quit 9 years ago.  . Alcohol Use: No  . Drug Use: No  . Sexually Active: Not on file   Other Topics Concern  . 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 No feather bedding, no mold. Son smokes. Pets including dogs, cats, 3 birds.    Angioedema with Aspirin.   Daily caffeine use two cups a day   Regular Exercise-yes             Current Outpatient Prescriptions on File Prior to Visit  Medication Sig Dispense Refill  . albuterol (PROAIR HFA) 108 (90 BASE) MCG/ACT inhaler Inhale 2 puffs into the lungs every 6 (six) hours as needed. For SOB.  1 Inhaler  5  . azelastine (ASTELIN) 137 MCG/SPRAY nasal spray Place 1 spray into the nose at bedtime. Use in each nostril as directed.  30 mL  1  . budesonide-formoterol (SYMBICORT) 80-4.5 MCG/ACT inhaler Inhale 2 puffs into the lungs 2 (two) times daily.  10.2 g  11  . clonazePAM (KLONOPIN) 1 MG tablet TAKE 1 TABLET BY MOUTH EVERY NIGHT AT BEDTIME AS NEEDED FOR ANXIETY  30 tablet  0  . dicyclomine (BENTYL) 10 MG capsule TAKE 1 CAPSULE BY MOUTH TWICE DAILY FOR CRAMPING AND SPASMS  60 capsule  0  . esomeprazole (NEXIUM) 40 MG capsule Take 1 capsule (40 mg total) by mouth daily.  30 capsule  11  . ezetimibe (ZETIA) 10 MG tablet Take 1 tablet (10 mg total) by mouth daily.  90 tablet  1  . ferrous sulfate 325 (65  FE) MG EC tablet Take 325 mg by mouth daily.       . fluocinonide cream (LIDEX) 0.05 % Apply 1 application topically 2 (two) times daily as needed. For dry skin      . furosemide (LASIX) 40 MG tablet Take 1 tablet (40 mg total) by mouth daily. For HTN.  30 tablet  5  . glucose blood test strip Use as instructed ACCUCHECK AVIVA TEST STRIPS , USE AS DIRECTED ONE TIME A DAY,  DIAGNOSIS CODE 250.0.  100 each  6  . HYDROcodone-acetaminophen (  NORCO/VICODIN) 5-325 MG per tablet TAKE 1 TABLET BY MOUTH EVERY 8 HOURS  90 tablet  0  . insulin glargine (LANTUS) 100 UNIT/ML injection Inject 0.5 mLs (50 Units total) into the skin at bedtime.  40 mL  1  . levalbuterol (XOPENEX) 0.63 MG/3ML nebulizer solution Take 1 ampule by nebulization every 6 (six) hours as needed. For wheezing      . levothyroxine (SYNTHROID) 88 MCG tablet Take 1 tablet (88 mcg total) by mouth daily.  90 tablet  3  . memantine (NAMENDA) 10 MG tablet Take 1 tablet (10 mg total) by mouth 2 (two) times daily.  60 tablet  11  . metFORMIN (GLUCOPHAGE) 1000 MG tablet Take 1 tablet (1,000 mg total) by mouth 2 (two) times daily with a meal.  180 tablet  1  . Multiple Vitamins-Minerals (CENTRUM SILVER PO) Take 1 tablet by mouth daily.      Marland Kitchen OLANZapine (ZYPREXA) 5 MG tablet Take 5 mg by mouth at bedtime.      Marland Kitchen omega-3 acid ethyl esters (LOVAZA) 1 G capsule Take 1 g by mouth 2 (two) times daily.      . ondansetron (ZOFRAN) 4 MG tablet Take 1 tablet (4 mg total) by mouth every 6 (six) hours.  12 tablet  0  . sitaGLIPtin (JANUVIA) 100 MG tablet Take 1 tablet (100 mg total) by mouth daily.  30 tablet  11  . Specialty Vitamins Products (ICAPS LUTEIN-ZEAXANTHIN) TBCR Take 1 tablet by mouth daily.      Marland Kitchen tiotropium (SPIRIVA HANDIHALER) 18 MCG inhalation capsule Place 1 capsule (18 mcg total) into inhaler and inhale daily.  90 capsule  3  . valsartan (DIOVAN) 160 MG tablet Take 1 tablet (160 mg total) by mouth daily.  90 tablet  3  . [DISCONTINUED] Calcium  Carbonate-Vitamin D (CALCIUM 600/VITAMIN D) 600-400 MG-UNIT per tablet Take 1 tablet by mouth 2 (two) times daily.        . [DISCONTINUED] sodium chloride (OCEAN) 0.65 % nasal spray Place 2 sprays into the nose as needed. TO OPEN NASAL BREATHING       No current facility-administered medications on file prior to visit.      Review of Systems Constitutional:  Negative for fever, chills, activity change and unexpected weight change.  HEENT:  Negative for hearing loss, ear pain, congestion, neck stiffness and postnasal drip. Negative for sore throat or swallowing problems. Negative for dental complaints.   Eyes: Negative for vision loss or change in visual acuity.  Respiratory: Negative for chest tightness and wheezing. Negative for DOE.   Cardiovascular: Negative for chest pain or palpitations. No decreased exercise tolerance Gastrointestinal: No change in bowel habit. No bloating or gas. No reflux or indigestion Genitourinary: Negative for urgency, frequency, flank pain and difficulty urinating.  Musculoskeletal: Negative for myalgias, back pain, arthralgias and gait problem.  Neurological: Negative for dizziness, tremors, weakness and headaches.  Hematological: Negative for adenopathy.  Psychiatric/Behavioral: Negative for behavioral problems and dysphoric mood.       Objective:   Physical Exam Filed Vitals:   05/10/13 0953  BP: 138/58  Pulse: 55  Temp: 97.7 F (36.5 C)  Resp: 12   Wt Readings from Last 3 Encounters:  05/10/13 189 lb 6.4 oz (85.911 kg)  01/26/13 197 lb (89.359 kg)  01/03/13 193 lb 0.6 oz (87.562 kg)   Gen'l: well nourished, well developed, obese white Woman in no distress HEENT - [place]/AT, EACs/TMs normal, oropharynx with full dentures, no buccal  lesions,  posterior pharynx clear, mucous membranes moist. C&S clear, PERRLA, fundi - normal Neck - supple, no thyromegaly Nodes- negative submental, cervical, supraclavicular regions Chest - no deformity, no  CVAT Lungs - clear without rales, wheezes. No increased work of breathing Breast - defferred to mammogram in Jan '14 Cardiovascular - regular rate and rhythm, quiet precordium, no murmurs, rubs or gallops, 2+ radial, DP and PT pulses Abdomen - Obese, BS+ x 4,  Pelvic - deferred  Rectal - deferred  Extremities - no clubbing, cyanosis, edema or deformity.  Neuro - A&O x 3, CN II-XII normal, motor strength normal and equal, DTRs 2+ and symmetrical biceps, radial, and patellar tendons. Cerebellar - no tremor, no rigidity, fluid movement and normal gait. Derm - Head, neck, back, abdomen and extremities without suspicious lesions  Recent Results (from the past 2160 hour(s))  LIPID PANEL     Status: Abnormal   Collection Time    05/10/13 11:09 AM      Result Value Range   Cholesterol 178  0 - 200 mg/dL   Comment: ATP III Classification       Desirable:  < 200 mg/dL               Borderline High:  200 - 239 mg/dL          High:  > = 161 mg/dL   Triglycerides 096.0  0.0 - 149.0 mg/dL   Comment: Normal:  <454 mg/dLBorderline High:  150 - 199 mg/dL   HDL 09.81  >19.14 mg/dL   VLDL 78.2  0.0 - 95.6 mg/dL   LDL Cholesterol 213 (*) 0 - 99 mg/dL   Total CHOL/HDL Ratio 4     Comment:                Men          Women1/2 Average Risk     3.4          3.3Average Risk          5.0          4.42X Average Risk          9.6          7.13X Average Risk          15.0          11.0                      HEMOGLOBIN A1C     Status: None   Collection Time    05/10/13 11:09 AM      Result Value Range   Hemoglobin A1C 5.6  4.6 - 6.5 %   Comment: Glycemic Control Guidelines for People with Diabetes:Non Diabetic:  <6%Goal of Therapy: <7%Additional Action Suggested:  >8%   MICROALBUMIN / CREATININE URINE RATIO     Status: Abnormal   Collection Time    05/10/13 11:09 AM      Result Value Range   Micro[place]b, Ur 6.3 (*) 0.0 - 1.9 mg/dL   Creatinine,U 086.5     Microalb Creat Ratio 2.7  0.0 - 30.0 mg/g  VITAMIN B12      Status: Abnormal   Collection Time    05/10/13 11:09 AM      Result Value Range   Vitamin B-12 186 (*) 211 - 911 pg/mL         Assessment & Plan:

## 2013-05-10 NOTE — Assessment & Plan Note (Signed)
On zetia and fish oil LDL at 115 is above goal of 100 or less  Plan Continue present medications  Work on weight loss and continued low fat diet. Mild exercise, e.g. Walking for 30 min daily, will also be a big help.

## 2013-05-10 NOTE — Assessment & Plan Note (Signed)
BP Readings from Last 3 Encounters:  05/10/13 138/58  01/26/13 126/70  01/21/13 107/92   Good control on present medications.

## 2013-05-10 NOTE — Assessment & Plan Note (Signed)
B12 is below normal range.  Plan Try oral replacement taking B12 1,000 mcg daily

## 2013-05-10 NOTE — Assessment & Plan Note (Signed)
No respiratory distress at exam and no h/o exacerbations recently  Plan Continue present medications.

## 2013-05-10 NOTE — Patient Instructions (Addendum)
Thanks for coming in.  History and exam reveal that you are medically stable. You did have some lab in April '14 and you did have a normal chest x-ray in April.  Labs today and a report will be sent to you.  Continue all your medications.

## 2013-05-12 ENCOUNTER — Ambulatory Visit: Payer: Medicare Other

## 2013-05-17 ENCOUNTER — Telehealth: Payer: Self-pay

## 2013-05-17 NOTE — Telephone Encounter (Signed)
Phone call from patient requesting labs be mailed to her. This was done today.

## 2013-05-29 ENCOUNTER — Ambulatory Visit (INDEPENDENT_AMBULATORY_CARE_PROVIDER_SITE_OTHER): Payer: Medicare Other

## 2013-05-29 ENCOUNTER — Encounter: Payer: Self-pay | Admitting: Internal Medicine

## 2013-05-29 ENCOUNTER — Ambulatory Visit (INDEPENDENT_AMBULATORY_CARE_PROVIDER_SITE_OTHER): Payer: Medicare Other | Admitting: Internal Medicine

## 2013-05-29 VITALS — BP 104/52 | HR 75 | Ht 61.0 in | Wt 185.2 lb

## 2013-05-29 VITALS — BP 116/60 | HR 73 | Wt 185.0 lb

## 2013-05-29 DIAGNOSIS — J309 Allergic rhinitis, unspecified: Secondary | ICD-10-CM

## 2013-05-29 DIAGNOSIS — I959 Hypotension, unspecified: Secondary | ICD-10-CM

## 2013-05-29 DIAGNOSIS — R0789 Other chest pain: Secondary | ICD-10-CM

## 2013-05-29 DIAGNOSIS — R071 Chest pain on breathing: Secondary | ICD-10-CM

## 2013-05-29 MED ORDER — FUROSEMIDE 40 MG PO TABS: 20.0000 mg | ORAL_TABLET | Freq: Every day | ORAL | Status: DC

## 2013-05-29 NOTE — Progress Notes (Signed)
Subjective:    Patient ID: [name], female    DOB: 21-Jun-1946, 67 y.o.   MRN: [id number]  HPI Seen urgently on referral from pulmonary due to patient c/o dizziness and blurred vision with SBP 104. She reports BP has been low since her furosemide was increased to 40 mg. Reviewed chart to August '13 and she has been on furosemide 40 mg the whole time. Her pressures have been better.  She continues to c/o dizziness and blurred vision but is breathing better.  Past Medical History  Diagnosis Date  . Personal history of colonic polyps   . Iron deficiency anemia, unspecified   . Other malaise and fatigue   . Chest pain, unspecified   . Other B-complex deficiencies   . Arthritis     Right Sacroiliac joint  . Other and unspecified hyperlipidemia   . Anxiety and depression   . HTN (hypertension)   . Unspecified hypothyroidism   . Type II or unspecified type diabetes mellitus without mention of complication, not stated as uncontrolled   . Allergic rhinitis, cause unspecified   . Unspecified asthma(493.90)   . Chronic airway obstruction, not elsewhere classified   . Abnormal stress test 08/21/2008    Neg  . GERD (gastroesophageal reflux disease)    Past Surgical History  Procedure Laterality Date  . Foot surgery      hammer toe 2004, 2005  . Abdominal hysterectomy  1982    menometorrhagia  . Carpal tunnel release  03/05/2009    bilateral   Family History  Problem Relation Age of Onset  . Hypertension Mother   . Diabetes Mother   . Heart disease Mother     CHF  . Coronary artery disease Mother   . Lung cancer Father   . Hypertension Father   . Diabetes Father   . Liver cancer Father     w/mets  . Prostate cancer Father   . Breast cancer Sister   . Lung disease Brother     Agent Orange Related  . Asthma Brother   . Allergies Brother   . Colon cancer Neg Hx    History   Social History  . Marital Status: Legally Separated    Spouse Name: N/A    Number of  Children: 6  . Years of Education: N/A   Occupational History  .     Social History Main Topics  . Smoking status: Former Smoker    Quit date: 06/01/1997  . Smokeless tobacco: Never Used     Comment: Quit 9 years ago.  . Alcohol Use: No  . Drug Use: No  . Sexually Active: Not on file   Other Topics Concern  . 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 No feather bedding, no mold. Son smokes. Pets including dogs, cats, 3 birds.    Angioedema with Aspirin.   Daily caffeine use two cups a day   Regular Exercise-yes             Current Outpatient Prescriptions on File Prior to Visit  Medication Sig Dispense Refill  .  albuterol (PROAIR HFA) 108 (90 BASE) MCG/ACT inhaler Inhale 2 puffs into the lungs every 6 (six) hours as needed. For SOB.  1 Inhaler  5  . azelastine (ASTELIN) 137 MCG/SPRAY nasal spray Place 1 spray into the nose at bedtime. Use in each nostril as directed.  30 mL  1  . budesonide-formoterol (SYMBICORT) 80-4.5 MCG/ACT inhaler Inhale 2 puffs into the lungs 2 (two) times daily.  10.2 g  11  . clonazePAM (KLONOPIN) 1 MG tablet TAKE 1 TABLET BY MOUTH EVERY NIGHT AT BEDTIME AS NEEDED FOR ANXIETY  30 tablet  0  . dicyclomine (BENTYL) 10 MG capsule TAKE 1 CAPSULE BY MOUTH TWICE DAILY FOR CRAMPING AND SPASMS  60 capsule  0  . esomeprazole (NEXIUM) 40 MG capsule Take 1 capsule (40 mg total) by mouth daily.  30 capsule  11  . ezetimibe (ZETIA) 10 MG tablet Take 1 tablet (10 mg total) by mouth daily.  90 tablet  1  . ferrous sulfate 325 (65 FE) MG EC tablet Take 325 mg by mouth daily.       . fluocinonide  cream (LIDEX) 0.05 % Apply 1 application topically 2 (two) times daily as needed. For dry skin      . glucose blood test strip Use as instructed ACCUCHECK AVIVA TEST STRIPS , USE AS DIRECTED ONE TIME A DAY,  DIAGNOSIS CODE 250.0.  100 each  6  . HYDROcodone-acetaminophen (NORCO/VICODIN) 5-325 MG per tablet TAKE 1 TABLET BY MOUTH EVERY 8 HOURS  90 tablet  0  . insulin glargine (LANTUS) 100 UNIT/ML injection Inject 0.5 mLs (50 Units total) into the skin at bedtime.  40 mL  1  . levalbuterol (XOPENEX) 0.63 MG/3ML nebulizer solution Take 1 ampule by nebulization every 6 (six) hours as needed. For wheezing      . levothyroxine (SYNTHROID) 88 MCG tablet Take 1 tablet (88 mcg total) by mouth daily.  90 tablet  3  . memantine (NAMENDA) 10 MG tablet Take 1 tablet (10 mg total) by mouth 2 (two) times daily.  60 tablet  11  . metFORMIN (GLUCOPHAGE) 1000 MG tablet Take 1 tablet (1,000 mg total) by mouth 2 (two) times daily with a meal.  180 tablet  1  . Multiple Vitamins-Minerals (CENTRUM SILVER PO) Take 1 tablet by mouth daily.      Marland Kitchen OLANZapine (ZYPREXA) 5 MG tablet Take 5 mg by mouth at bedtime.      Marland Kitchen omega-3 acid ethyl esters (LOVAZA) 1 G capsule Take 1 g by mouth 2 (two) times daily.      . ondansetron (ZOFRAN) 4 MG tablet Take 1 tablet (4 mg total) by mouth every 6 (six) hours.  12 tablet  0  . sitaGLIPtin (JANUVIA) 100 MG tablet Take 1 tablet (100 mg total) by mouth daily.  30 tablet  11  . Specialty Vitamins Products (ICAPS LUTEIN-ZEAXANTHIN) TBCR Take 1 tablet by mouth daily.      Marland Kitchen tiotropium (SPIRIVA HANDIHALER) 18 MCG inhalation capsule Place 1 capsule (18 mcg total) into inhaler and inhale daily.  90 capsule  3  . valsartan (DIOVAN) 160 MG tablet Take 1 tablet (160 mg total) by mouth daily.  90 tablet  3  . [DISCONTINUED] Calcium Carbonate-Vitamin D (CALCIUM 600/VITAMIN D) 600-400 MG-UNIT per tablet Take 1 tablet by mouth 2 (two) times daily.        . [DISCONTINUED] sodium chloride (OCEAN) 0.65 %  nasal spray Place 2 sprays into the nose as needed. TO OPEN  NASAL BREATHING       No current facility-administered medications on file prior to visit.      Review of Systems System review is negative for any constitutional, cardiac, pulmonary, GI or neuro symptoms or complaints other than as described in the HPI.      Objective:   Physical Exam Filed Vitals:   05/29/13 1528  BP: 116/60  Pulse: 73   Cro- RRR Neuro - A&O, speech clear, gait w/o assist.       Assessment & Plan:  Low BP and dizzy -  Plan - reduce furosemide to 20 mg  Report back on improvement on lower dose.

## 2013-05-29 NOTE — Patient Instructions (Addendum)
Low BP and dizzy -  Plan - reduce furosemide to 20 mg  Report back on improvement on lower dose.

## 2013-05-29 NOTE — Progress Notes (Signed)
Patient ID: [name], female    DOB: [birth year], 67 y.o.   MRN: [id number]  HPI 05/19/11- 67 yoF former heavy smoker followed for asthma/ COPD, hx food intolerance, allergic rhinitis, complicated by obesity, DM Last here November 20, 2010- note reviewed Allergy vaccine 1:10 GH Says she has been doing well. Spring pollen bothered nose and chest in March. Had some infection then, helped by doxycycline.  Reports no change in dry hacking cough, not relieved by cough syrup. Occasional, infrequent heart burn. CXR 02/08/11- [place]- lungs clear, ASVD We discussed updating PFT. She had smoked up to 3 PPD  07/13/11- 65 yoF former heavy smoker followed for asthma/ COPD, hx food intolerance, allergic rhinitis, complicated by obesity, DM Did not schedule PFT after last visit as intended, but has done so now.  She feels well. Remains off cigarettes.  Coughs only after using her inhaler- only a dry cough. No longer experiences shortness of breath.  Allergy vaccine- she feels they help her a lot. Went to ENT for ear pain- blamed throat congestion on reflux and Rx'd bid Nexium- "helps a lot".   09/18/11-  65 yoF former heavy smoker followed for asthma/ COPD, hx food intolerance, allergic rhinitis, complicated by obesity, DM Has had flu vaccine. She had done fairly well since here in August. Last night she had onset of dry cough and head congestion without fever or sore throat. Intermittent pain in the right side of the neck at the angle of the jaw comes and goes. It is not associated with popping, vertigo, pain on chewing or altered hearing. History of smoking 3 packs per day. PFT 08/26/2011-FEV1 1.45/76%, FEV1/FVC 0.7 no, FEF 25-75% was 35% of predicted with slight response to bronchodilator. TLC 100% RV 131% DLCO 54%. Moderate obstruction with slight response to bronchodilator and air trapping. This is probably mostly some emphysema.  CXR 01/29/2011-atherosclerosis with clear lung fields.  02/12/12-   65 yoF former heavy smoker followed for asthma/ COPD, hx food intolerance, allergic rhinitis, complicated by obesity, DM Continues allergy vaccine without problems. She is sure it helps. She used her last doxycycline about 2 weeks ago for chest congestion. We discussed this. With some vertigo and pressure in right ear. Often has pain in right side of neck. Occasional minor cough. She is on lisinopril and we discussed this carefully. Using rescue inhaler twice a week. Last PFT was 08/26/2011. Charted.  05/27/12-   65 yoF former heavy smoker followed for asthma/ COPD, hx food intolerance, allergic rhinitis, complicated by obesity, DM    PCP Dr Debby Bud Still on Allergy Vaccine 1:10 here; states rash on body from thighs to neck-just itchy all the time; Denies any wheezing,cough,SOB, or congestion. Has had pruritic rash on trunk and arms for 3 weeks, cause unknown. Cortisone cream has not seemed to help. She had taken cephalexin for UTI last week the rash began before that. Breathing has been good with no recent exacerbation and insignificant cough and wheeze.  11/28/12-  65 yoF former heavy smoker followed for asthma/ COPD, hx food intolerance, allergic rhinitis, complicated by obesity, DM    PCP Dr Debby Bud FOLLOWS FOR: still on vaccine. having lots of congestion and wheezing. For past 3 weeks acute illness. Sore throat moved to chest congestion with much wheeze, cough productive of clear mucus. Denies nasal congestion. She tried Augmentin which did not help. Did have flu vaccine. Family members living in the same house are very sick with similar illness. She has continued allergy  vaccine 1:10 GH without problems CXR 08/11/12 IMPRESSION:  No acute cardiopulmonary disease.  Original Report Authenticated By: Consuello Bossier, M.D.   05/29/13- 13 yoF former heavy smoker followed for asthma/ COPD, hx food intolerance, allergic rhinitis, complicated by obesity, DM    PCP Dr Debby Bud FOLLOWS FOR: still on allergy  vaccine 1:10 GH and doing well. No longer feels much problem with her breathing even on walks.  Substernal pain just as she lay down last night, fell asleep despite it. Notes knot on anterior chest. CXR 03/01/13 IMPRESSION:  No acute abnormality.  Original Report Authenticated By: Janeece Riggers, M.D.  Review of Systems- see HPI Constitutional:   No-   weight loss, night sweats, fevers, chills, fatigue, lassitude. HEENT:   No-  headaches, difficulty swallowing, tooth/dental problems, sore throat,       No-  sneezing, itching, No-ear ache, nasal congestion, post nasal drip,  CV:  +atypical chest pain, orthopnea, PND, swelling in lower extremities, anasarca, dizziness, palpitations Resp: Some  shortness of breath with exertion or at rest.              No- productive cough,  No- non-productive cough,  No-  coughing up of blood.              No-   change in color of mucus, No- wheezing.   Skin: HPI GI:  No-   heartburn, indigestion, abdominal pain, nausea, vomiting, GU: MS:  No-   joint pain or swelling.  Neuro- nothing unusual  Psych:  No- change in mood or affect. No depression or anxiety.  No memory loss.   Objective:   Physical Exam General- Alert, Oriented, Affect-appropriate, Distress- none acute    obese Skin- dry skin, No-rash Lymphadenopathy- none Head- atraumatic            Eyes- Gross vision intact, PERRLA, conjunctivae clear secretions            Ears- Hearing, canals- normal            Nose- Clear, No- Septal dev, mucus, polyps, erosion, perforation             Throat- Mallampati III , mucosa clear , drainage- none, tonsils- atrophic      dentures Neck- flexible , trachea midline, no stridor , thyroid nl, carotid no bruit, nontender Chest - symmetrical excursion , unlabored           Heart/CV- RRR , no murmur , no gallop  , no rub, nl s1 s2                           - JVD- none , edema- none, stasis changes- none, varices- none           Lung- clear , wheeze- none, No- cough  , dullness-none, rub- none           Chest wall- Prominent costochondral joint mid R sternum, non-tender. No other nodules                       felt.  Abd- Br/ Gen/ Rectal- Not done, not indicated Extrem- cyanosis- none, clubbing, none, atrophy- none, strength- nl Neuro- resting tremor/ head bob

## 2013-05-29 NOTE — Patient Instructions (Addendum)
We can continue allergy vaccine 1:10 GH  I'm glad your breathing is so much better. It is good for you to walk.  Please call as needed

## 2013-05-29 NOTE — Assessment & Plan Note (Signed)
Pain on lying down c/w musculoskeletal pain, or possible esphageal. No overt reflux. Not cardiac pain.

## 2013-05-30 ENCOUNTER — Other Ambulatory Visit: Payer: Self-pay | Admitting: Nurse Practitioner

## 2013-05-31 ENCOUNTER — Other Ambulatory Visit: Payer: Self-pay

## 2013-05-31 MED ORDER — CLONAZEPAM 1 MG PO TABS: ORAL_TABLET | ORAL | Status: DC

## 2013-05-31 MED ORDER — HYDROCODONE-ACETAMINOPHEN 5-325 MG PO TABS: ORAL_TABLET | ORAL | Status: DC

## 2013-05-31 NOTE — Telephone Encounter (Signed)
Clonazepam and Hydrocodone called to [facility] pharmacy

## 2013-06-02 ENCOUNTER — Other Ambulatory Visit: Payer: Self-pay | Admitting: Internal Medicine

## 2013-06-05 ENCOUNTER — Other Ambulatory Visit: Payer: Self-pay | Admitting: Internal Medicine

## 2013-06-05 ENCOUNTER — Ambulatory Visit: Payer: Medicare Other | Admitting: Gastroenterology

## 2013-06-09 ENCOUNTER — Other Ambulatory Visit: Payer: Self-pay | Admitting: *Deleted

## 2013-06-09 MED ORDER — DICYCLOMINE HCL 10 MG PO CAPS: 10.0000 mg | ORAL_CAPSULE | Freq: Two times a day (BID) | ORAL | Status: DC

## 2013-06-14 ENCOUNTER — Other Ambulatory Visit: Payer: Self-pay | Admitting: Internal Medicine

## 2013-06-19 ENCOUNTER — Ambulatory Visit (INDEPENDENT_AMBULATORY_CARE_PROVIDER_SITE_OTHER): Payer: Medicare Other

## 2013-06-19 ENCOUNTER — Other Ambulatory Visit: Payer: Self-pay

## 2013-06-19 DIAGNOSIS — J309 Allergic rhinitis, unspecified: Secondary | ICD-10-CM

## 2013-06-19 MED ORDER — LEVOTHYROXINE SODIUM 88 MCG PO TABS: 88.0000 ug | ORAL_TABLET | Freq: Every day | ORAL | Status: DC

## 2013-06-22 ENCOUNTER — Ambulatory Visit: Payer: Medicare Other

## 2013-06-22 ENCOUNTER — Telehealth: Payer: Self-pay | Admitting: *Deleted

## 2013-06-22 NOTE — Telephone Encounter (Signed)
Pt called requesting an increase in her Klonopin dosage.  Please advise

## 2013-06-22 NOTE — Telephone Encounter (Signed)
Will need ov

## 2013-06-23 NOTE — Telephone Encounter (Signed)
OV scheduled for 8.4.2014 @ 3pm

## 2013-06-29 ENCOUNTER — Ambulatory Visit (INDEPENDENT_AMBULATORY_CARE_PROVIDER_SITE_OTHER): Payer: Medicare Other

## 2013-06-29 DIAGNOSIS — J309 Allergic rhinitis, unspecified: Secondary | ICD-10-CM

## 2013-07-03 ENCOUNTER — Telehealth: Payer: Self-pay | Admitting: Internal Medicine

## 2013-07-03 ENCOUNTER — Ambulatory Visit: Payer: Medicare Other | Admitting: Internal Medicine

## 2013-07-03 MED ORDER — AZITHROMYCIN 250 MG PO TABS: ORAL_TABLET | ORAL | Status: DC

## 2013-07-03 NOTE — Telephone Encounter (Signed)
Per CDY: call in zpak and take claritin 1 daily  ---  Called, spoke with pt.  Informed her of above recs per Dr. Maple Hudson.  She verbalized understanding and is aware zpak sent to pharm.  Pt to call back if symptoms do not improve or worsen.

## 2013-07-03 NOTE — Telephone Encounter (Signed)
I spoke with pt. She c/o sneezing, watery/itchy eyes, HA, PND, cough w/ very little clear phlem, nasal congestion x 3-4 days. She has not tried anything OTC. She is requesting ABX. Please advise Dr. Maple Hudson thanks Last OV 05/29/13 Pending 11/28/13 Allergies  Allergen Reactions  . Aspirin Itching  . Codeine Itching  . Demerol Itching    Unknown-unconscious.  . Latex Itching  . Lisinopril Itching and Cough    REACTION: causes cough

## 2013-07-04 ENCOUNTER — Other Ambulatory Visit: Payer: Self-pay | Admitting: Internal Medicine

## 2013-07-05 ENCOUNTER — Ambulatory Visit (INDEPENDENT_AMBULATORY_CARE_PROVIDER_SITE_OTHER): Payer: Medicare Other | Admitting: Internal Medicine

## 2013-07-05 ENCOUNTER — Encounter: Payer: Self-pay | Admitting: Internal Medicine

## 2013-07-05 VITALS — BP 142/66 | HR 61 | Temp 97.2°F | Wt 183.4 lb

## 2013-07-05 DIAGNOSIS — F0281 Dementia in other diseases classified elsewhere with behavioral disturbance: Secondary | ICD-10-CM

## 2013-07-05 DIAGNOSIS — F0280 Dementia in other diseases classified elsewhere without behavioral disturbance: Secondary | ICD-10-CM

## 2013-07-05 DIAGNOSIS — I10 Essential (primary) hypertension: Secondary | ICD-10-CM

## 2013-07-05 DIAGNOSIS — E119 Type 2 diabetes mellitus without complications: Secondary | ICD-10-CM

## 2013-07-05 NOTE — Patient Instructions (Addendum)
Thanks for coming in.  1. Zyprexa has lot of side effects including increasing tremors, balance problems, insomnia.  Plan  No need to add medication or change the dose of the clonazepam  Taper off zyprexa: take it every other day for 1 week; take it every 3rd day for 1 week and then stop.   Watch for any recurrent problems with hallucinations or things crawling on your skin.   2. Lungs - glad you are doing better.  Plan  Medications as directed by Dr. Maple Hudson  3. Diabetes - last A1C in June '14 was 5.6%  GREAT work  4. Cholesterol - last lab in June '14 the LDL was 111 with a goal of 100 or less.  Plan Continue a low fat diet.  5. Falls - very dangerous to have so many falls.   Plan Will see if the balance and tremor gets better with stopping the Zyprexa  Please use your platform cane while having trouble with balance and falls. The cane is your friend.

## 2013-07-05 NOTE — Progress Notes (Signed)
Subjective:     Patient ID: [name], female   DOB: Feb 20, 1946, 67 y.o.   MRN: [id number]  HPI Comments: 1. Ms. Wille is a 67 YO female with history of longstanding diabetes and COPD who wants to increase her dose of clonazepam.  She has had difficulty sleeping nightly for the past two months.  She goes to bed at 8 and takes a dose then, wakes up at 2 am and cannot fall back asleep.  Insomnia x2 months; misses feeling rested at night.  Took 2 Tylenol PM last night to help her sleep but is hesitant to take it since she's also taking hydrocodone.  Previously tried alprazolam to help with sleep. Initially helped, has been helping less and less.  She desires to return her leftover medicine and exchange for larger dose.   2. She has also felt lightheaded and dizzy yesterday and today.  No precipitating, exacerbating or alleviating factors.  Bothers 3-4 days per week for the last month or so.  Starts in morning and clears up by 3p.  Scares her away from getting up and walking.  Fallen "lots of times", most recently 2 weeks ago.  Bent over to cut trees, fell upon standing.  Denies SOB, heart racing, chest pain, chest tightness, tinnitus, or visual changes.    Review of Systems  HENT: Negative for hearing loss, ear pain and tinnitus.   Eyes: Positive for visual disturbance. Negative for pain, discharge, redness and itching.       Floaters, covered by ophtho  Respiratory: Negative for chest tightness and shortness of breath.   Cardiovascular: Negative for chest pain.  Genitourinary: Negative for dysuria and decreased urine volume.  Neurological: Negative for dizziness, syncope and numbness.       Objective:   Physical Exam  Constitutional: She is oriented to person, place, and time. No distress.  HENT:  Head: Normocephalic and atraumatic.  Eyes: Right eye exhibits normal extraocular motion. Left eye exhibits abnormal extraocular motion.  Upon confrontation, both eyes looked left.  Minor  strabismus.    Cardiovascular: Normal rate, regular rhythm, normal heart sounds and intact distal pulses.  Exam reveals no gallop and no friction rub.   No murmur heard. Pulses were hard to detect due to postural tremor  Pulmonary/Chest: Effort normal and breath sounds normal. No respiratory distress. She has no wheezes. She exhibits no tenderness.  o2 sat of 96%, BP of 142/66, P 61  Neurological: She is alert and oriented to person, place, and time. She displays abnormal reflex. She exhibits abnormal muscle tone. Coordination abnormal.  Postural tremor Upon confrontation, both eyes looked to her right (PPRF overdrive?  R eye poor vision?)  Skin: She is not diaphoretic.  R foot had a callus on the plantar aspect, no other ulcers/caluses/lesions.       Assessment//plan     1. Medication increase for insomnia:  Since clozapine is not indicated for insomnia and tremor and since olanzapine can cause extrapyramidal symptoms such as these, in addition to lightheaddedness, she has been counseled to taper off her use of olanzapine.  For the next week, she should use olanzapine every other day, for the following week she should use it every third day, and after that, she should stop using it entirely.  She is to be alert for hallucinations and to follow up if they occur.    2. Lightheadedness: Ms. Klippel's lightheadedness is likely a side effect of her olanzapine use.  As per #1, she has  been advised to taper its use.  3. Diabetes: Ms. Sibilia diabetes is well managed.  Her A1C is at goal (most recently 5.6).  Her foot examination was normal.  She should follow up for repeat A1C every 3-6 months (between September and December of this year).  She was counseled to continue eating a low-carb diet and examining her feet.  4. Hypertension: Ms. Splinter blood pressure was higher than optimal today (142/66).  She was counseled regarding the importance of diet and exercise.

## 2013-07-05 NOTE — Telephone Encounter (Signed)
Hydrocodone called to pharmacy  

## 2013-07-06 ENCOUNTER — Ambulatory Visit (INDEPENDENT_AMBULATORY_CARE_PROVIDER_SITE_OTHER): Payer: Medicare Other

## 2013-07-06 DIAGNOSIS — J309 Allergic rhinitis, unspecified: Secondary | ICD-10-CM

## 2013-07-07 NOTE — Assessment & Plan Note (Signed)
Hypertension: [name] blood pressure was higher than optimal today (142/66).  She was counseled regarding the importance of diet and exercise.

## 2013-07-07 NOTE — Assessment & Plan Note (Signed)
Medication increase for insomnia:  Since clozapine is not indicated for insomnia and tremor and since olanzapine can cause extrapyramidal symptoms such as these, in addition to lightheaddedness, she has been counseled to taper off her use of olanzapine.  For the next week, she should use olanzapine every other day, for the following week she should use it every third day, and after that, she should stop using it entirely.  She is to be alert for hallucinations and to follow up if they occur.

## 2013-07-07 NOTE — Assessment & Plan Note (Signed)
[name] diabetes is well managed.  Her A1C is at goal (most recently 5.6).  Her foot examination was normal.  She should follow up for repeat A1C every 3-6 months (between September and December of this year).  She was counseled to continue eating a low-carb diet and examining her feet.

## 2013-07-12 ENCOUNTER — Ambulatory Visit (INDEPENDENT_AMBULATORY_CARE_PROVIDER_SITE_OTHER): Payer: Medicare Other

## 2013-07-12 ENCOUNTER — Ambulatory Visit (INDEPENDENT_AMBULATORY_CARE_PROVIDER_SITE_OTHER): Payer: Medicare Other | Admitting: Gastroenterology

## 2013-07-12 ENCOUNTER — Encounter: Payer: Self-pay | Admitting: Gastroenterology

## 2013-07-12 VITALS — BP 106/50 | HR 68 | Ht 61.0 in | Wt 182.0 lb

## 2013-07-12 DIAGNOSIS — R197 Diarrhea, unspecified: Secondary | ICD-10-CM | POA: Insufficient documentation

## 2013-07-12 DIAGNOSIS — J309 Allergic rhinitis, unspecified: Secondary | ICD-10-CM

## 2013-07-12 DIAGNOSIS — Z86010 Personal history of colonic polyps: Secondary | ICD-10-CM

## 2013-07-12 NOTE — Assessment & Plan Note (Signed)
Plan followup colonoscopy 

## 2013-07-12 NOTE — Progress Notes (Signed)
History of Present Illness: 67 year old white female with history of colon polyps, diabetes, COPD referred for evaluation of diarrhea.  For the past 6 months she has developed a change of bowel habits consisting of diarrhea accompanied by urgency and occasional incontinence.  This may occur 3-4 times a day.  She may have some lower abdominal pain at accompanying diarrhea.  There is no history of melena or hematochezia.  There has been no change in medications.  She tested C. difficile toxin negative.  An adenomatous polyp was removed in 2010 and and 2011 In 2011 a sessile ascending colon polyp was removed with remnants fulgurated with the argon plasma coagulator.  Three-year followup was recommended.    Past Medical History  Diagnosis Date  . Personal history of colonic polyps   . Iron deficiency anemia, unspecified   . Other malaise and fatigue   . Chest pain, unspecified   . Other B-complex deficiencies   . Arthritis     Right Sacroiliac joint  . Other and unspecified hyperlipidemia   . Anxiety and depression   . HTN (hypertension)   . Unspecified hypothyroidism   . Type II or unspecified type diabetes mellitus without mention of complication, not stated as uncontrolled   . Allergic rhinitis, cause unspecified   . Unspecified asthma(493.90)   . Chronic airway obstruction, not elsewhere classified   . Abnormal stress test 08/21/2008    Neg  . GERD (gastroesophageal reflux disease)    Past Surgical History  Procedure Laterality Date  . Foot surgery      hammer toe 2004, 2005  . Abdominal hysterectomy  1982    menometorrhagia  . Carpal tunnel release  03/05/2009    bilateral   family history includes Allergies in her brother; Asthma in her brother; Breast cancer in her sister; Coronary artery disease in her mother; Diabetes in her father and mother; Heart disease in her mother; Hypertension in her father and mother; Liver cancer in her father; Lung cancer in her father; Lung disease  in her brother; Prostate cancer in her father. There is no history of Colon cancer. Current Outpatient Prescriptions  Medication Sig Dispense Refill  . azelastine (ASTELIN) 137 MCG/SPRAY nasal spray Place 1 spray into the nose at bedtime. Use in each nostril as directed.  30 mL  1  . budesonide-formoterol (SYMBICORT) 80-4.5 MCG/ACT inhaler Inhale 2 puffs into the lungs 2 (two) times daily.  10.2 g  11  . clonazePAM (KLONOPIN) 1 MG tablet TAKE 1 TABLET BY MOUTH EVERY NIGHT AT BEDTIME AS NEEDED FOR ANXIETY  30 tablet  5  . dicyclomine (BENTYL) 10 MG capsule Take 1 capsule (10 mg total) by mouth 2 (two) times daily.  60 capsule  0  . DIOVAN 160 MG tablet TAKE 1 TABLET BY MOUTH DAILY  90 tablet  0  . ferrous sulfate 325 (65 FE) MG EC tablet Take 325 mg by mouth daily.       . fluocinonide cream (LIDEX) 0.05 % Apply 1 application topically 2 (two) times daily as needed. For dry skin      . furosemide (LASIX) 40 MG tablet Take 0.5 tablets (20 mg total) by mouth daily. For HTN.  30 tablet  5  . JANUVIA 100 MG tablet TAKE 1 TABLET BY MOUTH DAILY  30 tablet  5  . levalbuterol (XOPENEX) 0.63 MG/3ML nebulizer solution Take 1 ampule by nebulization every 6 (six) hours as needed. For wheezing      . levothyroxine (SYNTHROID) 88  MCG tablet Take 1 tablet (88 mcg total) by mouth daily.  90 tablet  3  . memantine (NAMENDA) 10 MG tablet Take 1 tablet (10 mg total) by mouth 2 (two) times daily.  60 tablet  11  . metFORMIN (GLUCOPHAGE) 1000 MG tablet Take 1 tablet (1,000 mg total) by mouth 2 (two) times daily with a meal.  180 tablet  1  . Multiple Vitamins-Minerals (CENTRUM SILVER PO) Take 1 tablet by mouth daily.      Marland Kitchen OLANZapine (ZYPREXA) 5 MG tablet Take 5 mg by mouth at bedtime.      Marland Kitchen omega-3 acid ethyl esters (LOVAZA) 1 G capsule Take 1 g by mouth 2 (two) times daily.      . ondansetron (ZOFRAN) 4 MG tablet Take 1 tablet (4 mg total) by mouth every 6 (six) hours.  12 tablet  0  . Specialty Vitamins Products  (ICAPS LUTEIN-ZEAXANTHIN) TBCR Take 1 tablet by mouth daily.      Marland Kitchen tiotropium (SPIRIVA HANDIHALER) 18 MCG inhalation capsule Place 1 capsule (18 mcg total) into inhaler and inhale daily.  90 capsule  3  . ZETIA 10 MG tablet TAKE 1 TABLET BY MOUTH EVERY DAY  90 tablet  0  . glucose blood test strip Use as instructed ACCUCHECK AVIVA TEST STRIPS , USE AS DIRECTED ONE TIME A DAY,  DIAGNOSIS CODE 250.0.  100 each  6  . HYDROcodone-acetaminophen (NORCO/VICODIN) 5-325 MG per tablet TAKE 1 TABLET BY MOUTH EVERY 8 HOURS  90 tablet  0  . insulin glargine (LANTUS) 100 UNIT/ML injection Inject 0.5 mLs (50 Units total) into the skin at bedtime.  40 mL  1  . [DISCONTINUED] Calcium Carbonate-Vitamin D (CALCIUM 600/VITAMIN D) 600-400 MG-UNIT per tablet Take 1 tablet by mouth 2 (two) times daily.        . [DISCONTINUED] sodium chloride (OCEAN) 0.65 % nasal spray Place 2 sprays into the nose as needed. TO OPEN NASAL BREATHING       No current facility-administered medications for this visit.   Allergies as of 07/12/2013 - Review Complete 07/12/2013  Allergen Reaction Noted  . Aspirin Itching   . Codeine Itching   . Demerol Itching 01/12/2012  . Latex Itching   . Lisinopril Itching and Cough     reports that she quit smoking about 16 years ago. She has never used smokeless tobacco. She reports that she does not drink alcohol or use illicit drugs.     Review of Systems: She complains of low back pain Pertinent positive and negative review of systems were noted in the above HPI section. All other review of systems were otherwise negative.  Vital signs were reviewed in today's medical record Physical Exam: General: Well developed , well nourished, no acute distress Skin: anicteric Head: Normocephalic and atraumatic Eyes:  sclerae anicteric, EOMI Ears: Normal auditory acuity Mouth: No deformity or lesions Neck: Supple, no masses or thyromegaly Lungs: Clear throughout to auscultation Heart: Regular rate  and rhythm; no murmurs, rubs or bruits Abdomen: Soft, non tender and non distended. No masses, hepatosplenomegaly or hernias noted. Normal Bowel sounds Rectal:deferred Musculoskeletal: Symmetrical with no gross deformities  Skin: No lesions on visible extremities Pulses:  Normal pulses noted Extremities: No clubbing, cyanosis, edema or deformities noted Neurological: Alert oriented x 4, grossly nonfocal.  She has a resting tremor Cervical Nodes:  No significant cervical adenopathy Inguinal Nodes: No significant inguinal adenopathy Psychological:  Alert and cooperative. Normal mood and affect

## 2013-07-12 NOTE — Assessment & Plan Note (Addendum)
Six-month history of diarrhea with occasional incontinence.  A structural abnormality  of the colon should be ruled out.  She'll overgrowth related to diabetes is a possibility.  Colitis should be ruled out as well.  Recommendations #1 colonoscopy

## 2013-07-13 ENCOUNTER — Ambulatory Visit: Payer: Medicare Other

## 2013-07-19 ENCOUNTER — Ambulatory Visit: Payer: Medicare Other

## 2013-07-19 ENCOUNTER — Telehealth: Payer: Self-pay | Admitting: Internal Medicine

## 2013-07-19 NOTE — Telephone Encounter (Signed)
Patient confirmed appt (tomorow 2 1030am w Dr. Shelle Iron) Nothing further needed

## 2013-07-19 NOTE — Telephone Encounter (Signed)
I spoke with pt. She reports she has been running fever of 100.3 x 2 days and her throat is sore, body aches. Denies any cough, no chest tx, no wheezing, no PND. Per pt she stated even with taking tylenol her temp does not go down. On 07/03/13 [facility] was called in for pt. I offered her appt for tomorrow but stated her daughter can't bring her bc her children have orientation. She stated will have to see about Friday but no sure. She stated why can't we just call something in. Since CDY is off will forward to doc of the day for recs.  Please advise KC thanks  Allergies  Allergen Reactions  . Aspirin Itching  . Codeine Itching  . Demerol Itching    Unknown-unconscious.  . Latex Itching  . Lisinopril Itching and Cough    REACTION: causes cough

## 2013-07-19 NOTE — Telephone Encounter (Signed)
Spoke with patient-- Informed her that she would need to be seen. Attempt schedule patient in Dr. Shelle Iron 1030am spot Patient to call back to confirm appt

## 2013-07-19 NOTE — Telephone Encounter (Signed)
Pt must be seen with ov.  Needs same day cxr before the visit.

## 2013-07-20 ENCOUNTER — Ambulatory Visit (INDEPENDENT_AMBULATORY_CARE_PROVIDER_SITE_OTHER)
Admission: RE | Admit: 2013-07-20 | Discharge: 2013-07-20 | Disposition: A | Discharge status: Home / Self Care | Payer: Medicare Other | Source: Ambulatory Visit | Attending: Pulmonary Disease | Admitting: Pulmonary Disease

## 2013-07-20 ENCOUNTER — Other Ambulatory Visit: Payer: Self-pay | Admitting: Internal Medicine

## 2013-07-20 ENCOUNTER — Encounter: Payer: Self-pay | Admitting: Pulmonary Disease

## 2013-07-20 ENCOUNTER — Ambulatory Visit (INDEPENDENT_AMBULATORY_CARE_PROVIDER_SITE_OTHER): Payer: Medicare Other | Admitting: Pulmonary Disease

## 2013-07-20 ENCOUNTER — Other Ambulatory Visit: Payer: Self-pay | Admitting: Neurology

## 2013-07-20 VITALS — BP 102/60 | HR 71 | Temp 99.5°F | Ht 62.0 in | Wt 186.0 lb

## 2013-07-20 DIAGNOSIS — J449 Chronic obstructive pulmonary disease, unspecified: Secondary | ICD-10-CM

## 2013-07-20 DIAGNOSIS — J029 Acute pharyngitis, unspecified: Secondary | ICD-10-CM | POA: Insufficient documentation

## 2013-07-20 DIAGNOSIS — J4489 Other specified chronic obstructive pulmonary disease: Secondary | ICD-10-CM

## 2013-07-20 MED ORDER — AMOXICILLIN-POT CLAVULANATE 875-125 MG PO TABS: 1.0000 | ORAL_TABLET | Freq: Two times a day (BID) | ORAL | Status: DC

## 2013-07-20 NOTE — Assessment & Plan Note (Signed)
Unclear if viral vs bacterial.  Will treat with a course of abx, and she can use warm salt water and lozenges for discomfort.  She is to call us if not improving.

## 2013-07-20 NOTE — Assessment & Plan Note (Signed)
The patient's chest x-ray is clear today, and she really is denying any pulmonary symptoms.  Her lungs are also clear to auscultation today.

## 2013-07-20 NOTE — Patient Instructions (Addendum)
Will treat with augmentin 875mg  one twice a day for 7 days. Use tylenol for fever and aches. Warm salt water gargles for throat if bothering you, and also OTC lozenges. Please call if you are not improving.

## 2013-07-20 NOTE — Addendum Note (Signed)
Addended by: Maisie Fus on: 07/20/2013 11:27 AM   Modules accepted: Orders

## 2013-07-20 NOTE — Progress Notes (Signed)
  Subjective:    Patient ID: [name], female    DOB: 16-Oct-1946, 68 y.o.   MRN: [id number]  HPI Patient comes in today for an acute sick visit.  The patient is usually followed by Dr. Maple Hudson for asthma and COPD.  She gives a 3 to four-day history of low grade fever, severe throat discomfort, as well as general malaise.  She denies any shortness of breath, cough with purulence, or chest congestion.  She has not had any sinus congestion or purulence noted, and denies severe pain.  She has had some neck soreness that she thinks is from lymphadenopathy.  She denies any issues with voice change, no difficulty swallowing solids or liquids, and no drooling.   Review of Systems  Constitutional: Positive for fever. Negative for unexpected weight change.  HENT: Positive for sore throat. Negative for ear pain, nosebleeds, congestion, rhinorrhea, sneezing, trouble swallowing, dental problem, postnasal drip and sinus pressure.   Eyes: Negative for redness and itching.  Respiratory: Negative for cough, chest tightness, shortness of breath and wheezing.   Cardiovascular: Negative for palpitations and leg swelling.  Gastrointestinal: Negative for nausea and vomiting.  Genitourinary: Negative for dysuria.  Musculoskeletal: Negative for joint swelling.  Skin: Negative for rash.  Neurological: Negative for headaches.  Hematological: Does not bruise/bleed easily.  Psychiatric/Behavioral: Negative for dysphoric mood. The patient is not nervous/anxious.        Objective:   Physical Exam Overweight female in no acute distress Nose without purulent discharge noted Oropharynx with significant erythema and edema of her posterior pharynx, but no exudates. Neck without lymphadenopathy or thyromegaly Chest totally clear to auscultation, no wheezing Cardiac exam with regular rate and rhythm, 2/6 systolic murmur Lower extremities with minimal edema, no cyanosis Alert and oriented, moves all 4  extremities.       Assessment & Plan:

## 2013-07-25 ENCOUNTER — Ambulatory Visit (INDEPENDENT_AMBULATORY_CARE_PROVIDER_SITE_OTHER): Payer: Medicare Other

## 2013-07-25 DIAGNOSIS — J309 Allergic rhinitis, unspecified: Secondary | ICD-10-CM

## 2013-08-02 ENCOUNTER — Ambulatory Visit (INDEPENDENT_AMBULATORY_CARE_PROVIDER_SITE_OTHER): Payer: Medicare Other

## 2013-08-02 ENCOUNTER — Other Ambulatory Visit: Payer: Self-pay | Admitting: Internal Medicine

## 2013-08-02 ENCOUNTER — Telehealth: Payer: Self-pay | Admitting: Gastroenterology

## 2013-08-02 DIAGNOSIS — J309 Allergic rhinitis, unspecified: Secondary | ICD-10-CM

## 2013-08-02 NOTE — Telephone Encounter (Signed)
Pt called and wants to know if she should be continuing to take Bentyl. If she is to continue she needs a refill. Please advise.

## 2013-08-02 NOTE — Telephone Encounter (Signed)
She should take it only if she feels it has helped her diarrhea, in which case renew the med.

## 2013-08-02 NOTE — Telephone Encounter (Signed)
Hydrocodone called to pharmacy  

## 2013-08-03 NOTE — Telephone Encounter (Signed)
Left message for pt to call back  °

## 2013-08-04 ENCOUNTER — Ambulatory Visit: Payer: Medicare Other

## 2013-08-04 NOTE — Telephone Encounter (Signed)
Left message for pt to call back  °

## 2013-08-07 NOTE — Telephone Encounter (Signed)
Spoke with pt and she is aware, states it has been helping her diarrhea. Pt states she had the script refilled by the pharmacy.

## 2013-08-09 ENCOUNTER — Telehealth: Payer: Self-pay | Admitting: Internal Medicine

## 2013-08-09 ENCOUNTER — Ambulatory Visit: Payer: Medicare Other

## 2013-08-09 MED ORDER — DOXYCYCLINE HYCLATE 100 MG PO TABS: ORAL_TABLET | ORAL | Status: DC

## 2013-08-09 NOTE — Telephone Encounter (Signed)
Ok doxycycline 100 mg, # 8, 2 today then one daily 

## 2013-08-09 NOTE — Telephone Encounter (Signed)
Spoke with pt  She states that she is having sinus pressure, HA and prod cough with clear sputum, and runny nose No fever  Pt states wants rx for doxy Please advise thanks! Last ov was acute with [facility] for on 07/20/13 Next ov 11/28/13 Allergies  Allergen Reactions  . Aspirin Itching  . Codeine Itching  . Demerol Itching    Unknown-unconscious.  . Latex Itching  . Lisinopril Itching and Cough    REACTION: causes cough   Please advise, thanks!

## 2013-08-09 NOTE — Telephone Encounter (Signed)
Rx was sent to pharm  Pt aware  

## 2013-08-11 ENCOUNTER — Ambulatory Visit (INDEPENDENT_AMBULATORY_CARE_PROVIDER_SITE_OTHER): Payer: Medicare Other

## 2013-08-11 DIAGNOSIS — J309 Allergic rhinitis, unspecified: Secondary | ICD-10-CM

## 2013-08-15 ENCOUNTER — Telehealth: Payer: Self-pay | Admitting: Internal Medicine

## 2013-08-15 MED ORDER — AMOXICILLIN-POT CLAVULANATE 875-125 MG PO TABS: 1.0000 | ORAL_TABLET | Freq: Two times a day (BID) | ORAL | Status: DC

## 2013-08-15 NOTE — Telephone Encounter (Signed)
Per 9.10.14 phone note, pt was given doxycycline 100mg  #8  Called spoke with patient who reported that her symptoms from previous phone note have not resolved and are worse x3 days with HA, head congestion with clear drainage, PND, dry cough, wheezing, increased SOB, and fever up to 102.  Pt denies any chest tightness, n/v, hemoptysis.  No openings with CY this week.  No openings with TP.  Pt last ov was 8.21.14 with [facility] for acute visit.  Dr Maple Hudson please advise, thank you. [facility] Allergies  Allergen Reactions  . Aspirin Itching  . Codeine Itching  . Demerol Itching    Unknown-unconscious.  . Latex Itching  . Lisinopril Itching and Cough    REACTION: causes cough

## 2013-08-15 NOTE — Telephone Encounter (Signed)
Spoke with pt and advised of Dr Roxy Cedar recommednations .  Pt requested rx be called in .  Rx Sent per Dr Maple Hudson

## 2013-08-15 NOTE — Telephone Encounter (Signed)
Per CY-she can come in on Wednesday 08-16-13 at 11:00am or we can call her in Augmentin 875 mg #14 take 1 po BID no refills.

## 2013-08-16 ENCOUNTER — Other Ambulatory Visit (HOSPITAL_COMMUNITY): Payer: Self-pay | Admitting: Internal Medicine

## 2013-08-17 ENCOUNTER — Ambulatory Visit (INDEPENDENT_AMBULATORY_CARE_PROVIDER_SITE_OTHER): Payer: Medicare Other

## 2013-08-17 ENCOUNTER — Encounter: Payer: Self-pay | Admitting: Internal Medicine

## 2013-08-17 ENCOUNTER — Ambulatory Visit (INDEPENDENT_AMBULATORY_CARE_PROVIDER_SITE_OTHER): Payer: Medicare Other | Admitting: Internal Medicine

## 2013-08-17 VITALS — BP 136/58 | HR 57 | Temp 96.9°F | Wt 186.0 lb

## 2013-08-17 DIAGNOSIS — I10 Essential (primary) hypertension: Secondary | ICD-10-CM

## 2013-08-17 DIAGNOSIS — J309 Allergic rhinitis, unspecified: Secondary | ICD-10-CM

## 2013-08-17 DIAGNOSIS — E119 Type 2 diabetes mellitus without complications: Secondary | ICD-10-CM

## 2013-08-17 DIAGNOSIS — E538 Deficiency of other specified B group vitamins: Secondary | ICD-10-CM

## 2013-08-17 MED ORDER — CYANOCOBALAMIN 1000 MCG/ML IJ SOLN
1000.0000 ug | Freq: Once | INTRAMUSCULAR | Status: AC
Start: 2013-08-17 — End: 2013-08-17
  Administered 2013-08-17: 1000 ug via INTRAMUSCULAR

## 2013-08-17 NOTE — Progress Notes (Signed)
Subjective:    Patient ID: [name], female    DOB: 1946-10-10, 67 y.o.   MRN: [id number]  HPI [name] comes in today for a B12 injection. Last B12 level 186 spring '14.   She reports that Januvia makes her feel bad. Reviewed her renal function - last Cr. 1.0 in Spring '14.   PMH, FamHx and SocHx reviewed for any changes and relevance.  Current Outpatient Prescriptions on File Prior to Visit  Medication Sig Dispense Refill  . amoxicillin-clavulanate (AUGMENTIN) 875-125 MG per tablet Take 1 tablet by mouth 2 (two) times daily.  14 tablet  0  . azelastine (ASTELIN) 137 MCG/SPRAY nasal spray Place 1 spray into the nose at bedtime. Use in each nostril as directed.  30 mL  1  . budesonide-formoterol (SYMBICORT) 80-4.5 MCG/ACT inhaler Inhale 2 puffs into the lungs 2 (two) times daily.  10.2 g  11  . clonazePAM (KLONOPIN) 1 MG tablet TAKE 1 TABLET BY MOUTH EVERY NIGHT AT BEDTIME AS NEEDED FOR ANXIETY  30 tablet  5  . dicyclomine (BENTYL) 10 MG capsule Take 1 capsule (10 mg total) by mouth 2 (two) times daily.  60 capsule  0  . DIOVAN 160 MG tablet TAKE 1 TABLET BY MOUTH DAILY  90 tablet  0  . doxycycline (VIBRA-TABS) 100 MG tablet Take 2 today, then one daily until gone  8 tablet  0  . ferrous sulfate 325 (65 FE) MG EC tablet Take 325 mg by mouth daily.       . fluocinonide cream (LIDEX) 0.05 % Apply 1 application topically 2 (two) times daily as needed. For dry skin      . furosemide (LASIX) 40 MG tablet Take 0.5 tablets (20 mg total) by mouth daily. For HTN.  30 tablet  5  . furosemide (LASIX) 40 MG tablet TAKE 1 TABLET BY MOUTH EVERY DAY FOR HYPERTENSION(HIGH BLOOD PRESSURE)  30 tablet  5  . glucose blood test strip Use as instructed ACCUCHECK AVIVA TEST STRIPS , USE AS DIRECTED ONE TIME A DAY,  DIAGNOSIS CODE 250.0.  100 each  6  . HYDROcodone-acetaminophen (NORCO/VICODIN) 5-325 MG per tablet TAKE 1 TABLET BY MOUTH EVERY 8 HOURS AS NEEDED  90 tablet  0  . insulin glargine (LANTUS)  100 UNIT/ML injection Inject 0.5 mLs (50 Units total) into the skin at bedtime.  40 mL  1  . JANUVIA 100 MG tablet TAKE 1 TABLET BY MOUTH DAILY  30 tablet  5  . levalbuterol (XOPENEX) 0.63 MG/3ML nebulizer solution Take 1 ampule by nebulization every 6 (six) hours as needed. For wheezing      . levothyroxine (SYNTHROID) 88 MCG tablet Take 1 tablet (88 mcg total) by mouth daily.  90 tablet  3  . metFORMIN (GLUCOPHAGE) 1000 MG tablet Take 1 tablet (1,000 mg total) by mouth 2 (two) times daily with a meal.  180 tablet  1  . Multiple Vitamins-Minerals (CENTRUM SILVER PO) Take 1 tablet by mouth daily.      Marland Kitchen NAMENDA 10 MG tablet TAKE 1 TABLET BY MOUTH TWICE DAILY  60 tablet  5  . OLANZapine (ZYPREXA) 5 MG tablet Take 5 mg by mouth at bedtime.      Marland Kitchen omega-3 acid ethyl esters (LOVAZA) 1 G capsule Take 1 g by mouth 2 (two) times daily.      . ondansetron (ZOFRAN) 4 MG tablet Take 1 tablet (4 mg total) by mouth every 6 (six) hours.  12 tablet  0  . Specialty Vitamins Products (ICAPS LUTEIN-ZEAXANTHIN) TBCR Take 1 tablet by mouth daily.      Marland Kitchen tiotropium (SPIRIVA HANDIHALER) 18 MCG inhalation capsule Place 1 capsule (18 mcg total) into inhaler and inhale daily.  90 capsule  3  . ZETIA 10 MG tablet TAKE 1 TABLET BY MOUTH EVERY DAY  90 tablet  0  . [DISCONTINUED] Calcium Carbonate-Vitamin D (CALCIUM 600/VITAMIN D) 600-400 MG-UNIT per tablet Take 1 tablet by mouth 2 (two) times daily.        . [DISCONTINUED] sodium chloride (OCEAN) 0.65 % nasal spray Place 2 sprays into the nose as needed. TO OPEN NASAL BREATHING       No current facility-administered medications on file prior to visit.      Review of Systems System review is negative for any constitutional, cardiac, pulmonary, GI or neuro symptoms or complaints other than as described in the HPI.     Objective:   Physical Exam Filed Vitals:   08/17/13 1013  BP: 136/58  Pulse: 57  Temp: 96.9 F (36.1 C)   Wt Readings from Last 3 Encounters:   08/17/13 186 lb (84.369 kg)  07/20/13 186 lb (84.369 kg)  07/12/13 182 lb (82.555 kg)   She has lost 17 lbs since Dec '13.  WNWD woman Cor- 2+ radial, RRR, no murmur Pulm - lungs are clear Neuro - A&O        Assessment & Plan:

## 2013-08-17 NOTE — Patient Instructions (Addendum)
1. B12 deficiency - you are due. Will have you come in monthly for B12 injections for 6 months and then recheck the B1`2 level  2. Diabetes - sorry the [place] doesn't agree with you. Plan Stop januvia  Start Inovakan  A1C in 3 months

## 2013-08-20 MED ORDER — CANAGLIFLOZIN 100 MG PO TABS: 100.0000 mg | ORAL_TABLET | Freq: Every day | ORAL | Status: DC

## 2013-08-20 NOTE — Assessment & Plan Note (Signed)
BP Readings from Last 3 Encounters:  08/17/13 136/58  07/20/13 102/60  07/12/13 106/50   Good contorl on present medications

## 2013-08-20 NOTE — Assessment & Plan Note (Signed)
Lab Results  Component Value Date   VITAMINB12 186* 05/10/2013   Patient to resume injection therapy: 1,085mcg IM monthly

## 2013-08-20 NOTE — Assessment & Plan Note (Signed)
Intolerant of [place].  Plan D/c januvia  Start invokana

## 2013-08-25 ENCOUNTER — Ambulatory Visit: Payer: Medicare Other

## 2013-08-28 ENCOUNTER — Ambulatory Visit (INDEPENDENT_AMBULATORY_CARE_PROVIDER_SITE_OTHER): Payer: Medicare Other

## 2013-08-28 DIAGNOSIS — J309 Allergic rhinitis, unspecified: Secondary | ICD-10-CM

## 2013-08-30 ENCOUNTER — Telehealth: Payer: Self-pay

## 2013-08-30 MED ORDER — CANAGLIFLOZIN 100 MG PO TABS: 1.0000 | ORAL_TABLET | Freq: Every day | ORAL | Status: DC

## 2013-08-30 NOTE — Telephone Encounter (Signed)
Ok for invokana 100 mg daily, #30 refill x 11

## 2013-08-30 NOTE — Telephone Encounter (Signed)
Prescription has been sent. Patient has been notified.

## 2013-08-30 NOTE — Telephone Encounter (Signed)
Phone call from patient stating she was seen 08/17/13 and was started on a new diabetic medication, Invokana? She would like a prescription now. Please advise.

## 2013-08-31 ENCOUNTER — Telehealth: Payer: Self-pay

## 2013-08-31 NOTE — Telephone Encounter (Signed)
Phone call from patient stating [name] is not covered under her insurance. Is there an alternative medication she can try? Please advise.

## 2013-09-01 ENCOUNTER — Telehealth: Payer: Self-pay | Admitting: Internal Medicine

## 2013-09-01 ENCOUNTER — Telehealth: Payer: Self-pay | Admitting: *Deleted

## 2013-09-01 ENCOUNTER — Other Ambulatory Visit: Payer: Self-pay

## 2013-09-01 MED ORDER — HYDROCODONE-ACETAMINOPHEN 5-325 MG PO TABS: ORAL_TABLET | ORAL | Status: DC

## 2013-09-01 MED ORDER — DOXYCYCLINE HYCLATE 100 MG PO CAPS: ORAL_CAPSULE | ORAL | Status: DC

## 2013-09-01 MED ORDER — PIOGLITAZONE HCL 30 MG PO TABS: 30.0000 mg | ORAL_TABLET | Freq: Every day | ORAL | Status: DC

## 2013-09-01 NOTE — Telephone Encounter (Signed)
Pt called requesting refill on hydrocodone.  Please advise in Dr Debby Bud absence

## 2013-09-01 NOTE — Telephone Encounter (Signed)
Per Cy-okay to give Doxycycline 100 mg #8 take 2 today then 1 daily until gone no refills.

## 2013-09-01 NOTE — Telephone Encounter (Signed)
Called and spoke with pt and she is aware of medication that has been sent to the pharmacy. Nothing further is needed.  

## 2013-09-01 NOTE — Telephone Encounter (Signed)
Called and spoke with pt and she stated that she is having nasal congestion and sinus infection x 1 week.  Pt stated that she has temp of 102 today.  Pt is requesting that doxy capsules be called to her pharmacy.  CY please advise. Thanks  Allergies  Allergen Reactions  . Aspirin Itching  . Codeine Itching  . Demerol Itching    Unknown-unconscious.  . Latex Itching  . Lisinopril Itching and Cough    REACTION: causes cough   Last ov--05/29/2013 Next ov--11/28/2013

## 2013-09-01 NOTE — Telephone Encounter (Signed)
Per Dr [name] patient to start on Actos 30 mg since [name] is not covered by her insurance. Patient advised of new medication.

## 2013-09-04 ENCOUNTER — Telehealth: Payer: Self-pay | Admitting: Gastroenterology

## 2013-09-04 NOTE — Telephone Encounter (Signed)
Left message for pt and instructed her on her Metformin

## 2013-09-05 ENCOUNTER — Telehealth: Payer: Self-pay | Admitting: *Deleted

## 2013-09-05 ENCOUNTER — Telehealth: Payer: Self-pay | Admitting: Gastroenterology

## 2013-09-05 NOTE — Telephone Encounter (Signed)
Patient's mother answered, and stated that she called about patient.  She states that patient was exposed to Whooping Cough.   States that patient is asymptomatic at this time.  I told her that the patient can come on in, and to watch for fever, etc.. She said that the patient feels fine at the moment.   They really aren't sure that the baby has whooping cough at this time.  Just FYI.  Spoke with infection control nurse, and she said that patient was ok to do procedure.

## 2013-09-06 ENCOUNTER — Telehealth: Payer: Self-pay | Admitting: Gastroenterology

## 2013-09-06 ENCOUNTER — Ambulatory Visit (HOSPITAL_COMMUNITY)
Admission: RE | Admit: 2013-09-06 | Discharge: 2013-09-06 | Disposition: A | Discharge status: Home / Self Care | Payer: Medicare Other | Source: Ambulatory Visit | Attending: Gastroenterology | Admitting: Gastroenterology

## 2013-09-06 ENCOUNTER — Encounter (HOSPITAL_COMMUNITY)
Admission: RE | Disposition: A | Discharge status: Home / Self Care | Payer: Self-pay | Source: Ambulatory Visit | Attending: Gastroenterology

## 2013-09-06 ENCOUNTER — Ambulatory Visit (INDEPENDENT_AMBULATORY_CARE_PROVIDER_SITE_OTHER): Payer: Medicare Other

## 2013-09-06 ENCOUNTER — Encounter (HOSPITAL_COMMUNITY): Payer: Self-pay | Admitting: *Deleted

## 2013-09-06 DIAGNOSIS — J309 Allergic rhinitis, unspecified: Secondary | ICD-10-CM

## 2013-09-06 DIAGNOSIS — Z860100 Personal history of colon polyps, unspecified: Secondary | ICD-10-CM | POA: Insufficient documentation

## 2013-09-06 DIAGNOSIS — I10 Essential (primary) hypertension: Secondary | ICD-10-CM | POA: Insufficient documentation

## 2013-09-06 DIAGNOSIS — J4489 Other specified chronic obstructive pulmonary disease: Secondary | ICD-10-CM | POA: Insufficient documentation

## 2013-09-06 DIAGNOSIS — J449 Chronic obstructive pulmonary disease, unspecified: Secondary | ICD-10-CM | POA: Insufficient documentation

## 2013-09-06 DIAGNOSIS — E039 Hypothyroidism, unspecified: Secondary | ICD-10-CM | POA: Insufficient documentation

## 2013-09-06 DIAGNOSIS — K5730 Diverticulosis of large intestine without perforation or abscess without bleeding: Secondary | ICD-10-CM | POA: Insufficient documentation

## 2013-09-06 DIAGNOSIS — Z794 Long term (current) use of insulin: Secondary | ICD-10-CM | POA: Insufficient documentation

## 2013-09-06 DIAGNOSIS — E119 Type 2 diabetes mellitus without complications: Secondary | ICD-10-CM | POA: Insufficient documentation

## 2013-09-06 DIAGNOSIS — R197 Diarrhea, unspecified: Secondary | ICD-10-CM

## 2013-09-06 DIAGNOSIS — Z79899 Other long term (current) drug therapy: Secondary | ICD-10-CM | POA: Insufficient documentation

## 2013-09-06 DIAGNOSIS — K219 Gastro-esophageal reflux disease without esophagitis: Secondary | ICD-10-CM | POA: Insufficient documentation

## 2013-09-06 HISTORY — PX: COLONOSCOPY: SHX5424

## 2013-09-06 HISTORY — PX: HOT HEMOSTASIS: SHX5433

## 2013-09-06 LAB — GLUCOSE, CAPILLARY: Glucose-Capillary: 101 mg/dL — ABNORMAL HIGH (ref 70–99)

## 2013-09-06 SURGERY — COLONOSCOPY
Anesthesia: Moderate Sedation

## 2013-09-06 MED ORDER — FENTANYL CITRATE 0.05 MG/ML IJ SOLN
INTRAMUSCULAR | Status: DC | PRN
  Administered 2013-09-06 (×4): 25 ug via INTRAVENOUS

## 2013-09-06 MED ORDER — FENTANYL CITRATE 0.05 MG/ML IJ SOLN
INTRAMUSCULAR | Status: AC
  Filled 2013-09-06: qty 4

## 2013-09-06 MED ORDER — MIDAZOLAM HCL 5 MG/5ML IJ SOLN
INTRAMUSCULAR | Status: DC | PRN
  Administered 2013-09-06 (×3): 2 mg via INTRAVENOUS

## 2013-09-06 MED ORDER — MIDAZOLAM HCL 10 MG/2ML IJ SOLN
INTRAMUSCULAR | Status: AC
  Filled 2013-09-06: qty 4

## 2013-09-06 MED ORDER — DIPHENHYDRAMINE HCL 50 MG/ML IJ SOLN
INTRAMUSCULAR | Status: DC | PRN
  Administered 2013-09-06 (×2): 12.5 mg via INTRAVENOUS

## 2013-09-06 MED ORDER — DIPHENHYDRAMINE HCL 50 MG/ML IJ SOLN
INTRAMUSCULAR | Status: AC
  Filled 2013-09-06: qty 1

## 2013-09-06 NOTE — Op Note (Signed)
[facility] [address] [place], [address]   COLONOSCOPY PROCEDURE REPORT  PATIENT: Isabella, [name]  MR#: [id number] BIRTHDATE: Sep 06, 1946 , 67  yrs. old GENDER: Female ENDOSCOPIST: Louis Meckel, MD REFERRED BY: PROCEDURE DATE:  09/06/2013 PROCEDURE:   Colonoscopy with biopsy ASA CLASS:   Class II INDICATIONS:unexplained diarrhea and Patient's personal history of adenomatous colon polyps. MEDICATIONS: These medications were titrated to patient response per physician's verbal order, Versed 6 mg IV, Fentanyl 100 mcg IV, and Benadryl 25 mg IV  DESCRIPTION OF PROCEDURE:   After the risks benefits and alternatives of the procedure were thoroughly explained, informed consent was obtained.  A digital rectal exam revealed external hemorrhoids.   The Pentax Colonoscope [id number]  endoscope was introduced through the anus and advanced to the cecum, which was identified by both the appendix and ileocecal valve. No adverse events experienced.   The quality of the prep was excellent using Suprep  The instrument was then slowly withdrawn as the colon was fully examined.      COLON FINDINGS: Mild diverticulosis was noted in the sigmoid colon. The colon mucosa was otherwise normal. Random biopsies were taken to r/o microscopic colitis.   Retroflexed views revealed no abnormalities. The time to cecum=  .  Withdrawal time=8 minutes 0 seconds.  The scope was withdrawn and the procedure completed. COMPLICATIONS: There were no complications.  ENDOSCOPIC IMPRESSION: 1.   Mild diverticulosis was noted in the sigmoid colon 2.   The colon mucosa was otherwise normal  RECOMMENDATIONS: 1.  await biopsy results 2.  OV 2-3 weeks  eSigned:  Louis Meckel, MD 09/06/2013 9:50 AM   cc: Jacques Navy, MD and Bernadette Hoit, MD

## 2013-09-06 NOTE — H&P (Signed)
History of Present Illness:  Signed                   History of Present Illness: 67 year old white female with history of colon polyps, diabetes, COPD referred for evaluation of diarrhea.  For the past 6 months she has developed a change of bowel habits consisting of diarrhea accompanied by urgency and occasional incontinence.  This may occur 3-4 times a day.  She may have some lower abdominal pain at accompanying diarrhea.  There is no history of melena or hematochezia.  There has been no change in medications.  She tested C. difficile toxin negative.  An adenomatous polyp was removed in 2010 and and 2011 In 2011 a sessile ascending colon polyp was removed with remnants fulgurated with the argon plasma coagulator.  Three-year followup was recommended.        Past Medical History   Diagnosis  Date   .  Personal history of colonic polyps     .  Iron deficiency anemia, unspecified     .  Other malaise and fatigue     .  Chest pain, unspecified     .  Other B-complex deficiencies     .  Arthritis         Right Sacroiliac joint   .  Other and unspecified hyperlipidemia     .  Anxiety and depression     .  HTN (hypertension)     .  Unspecified hypothyroidism     .  Type II or unspecified type diabetes mellitus without mention of complication, not stated as uncontrolled     .  Allergic rhinitis, cause unspecified     .  Unspecified asthma(493.90)     .  Chronic airway obstruction, not elsewhere classified     .  Abnormal stress test  08/21/2008       Neg   .  GERD (gastroesophageal reflux disease)         Past Surgical History   Procedure  Laterality  Date   .  Foot surgery           hammer toe 2004, 2005   .  Abdominal hysterectomy    1982       menometorrhagia   .  Carpal tunnel release    03/05/2009       bilateral      family history includes Allergies in her brother; Asthma in her brother; Breast cancer in her sister; Coronary artery disease in her mother; Diabetes in her  father and mother; Heart disease in her mother; Hypertension in her father and mother; Liver cancer in her father; Lung cancer in her father; Lung disease in her brother; Prostate cancer in her father. There is no history of Colon cancer. Current Outpatient Prescriptions   Medication  Sig  Dispense  Refill   .  azelastine (ASTELIN) 137 MCG/SPRAY nasal spray  Place 1 spray into the nose at bedtime. Use in each nostril as directed.   30 mL   1   .  budesonide-formoterol (SYMBICORT) 80-4.5 MCG/ACT inhaler  Inhale 2 puffs into the lungs 2 (two) times daily.   10.2 g   11   .  clonazePAM (KLONOPIN) 1 MG tablet  TAKE 1 TABLET BY MOUTH EVERY NIGHT AT BEDTIME AS NEEDED FOR ANXIETY   30 tablet   5   .  dicyclomine (BENTYL) 10 MG capsule  Take 1 capsule (10 mg total) by mouth 2 (two) times daily.  60 capsule   0   .  DIOVAN 160 MG tablet  TAKE 1 TABLET BY MOUTH DAILY   90 tablet   0   .  ferrous sulfate 325 (65 FE) MG EC tablet  Take 325 mg by mouth daily.          .  fluocinonide cream (LIDEX) 0.05 %  Apply 1 application topically 2 (two) times daily as needed. For dry skin         .  furosemide (LASIX) 40 MG tablet  Take 0.5 tablets (20 mg total) by mouth daily. For HTN.   30 tablet   5   .  JANUVIA 100 MG tablet  TAKE 1 TABLET BY MOUTH DAILY   30 tablet   5   .  levalbuterol (XOPENEX) 0.63 MG/3ML nebulizer solution  Take 1 ampule by nebulization every 6 (six) hours as needed. For wheezing         .  levothyroxine (SYNTHROID) 88 MCG tablet  Take 1 tablet (88 mcg total) by mouth daily.   90 tablet   3   .  memantine (NAMENDA) 10 MG tablet  Take 1 tablet (10 mg total) by mouth 2 (two) times daily.   60 tablet   11   .  metFORMIN (GLUCOPHAGE) 1000 MG tablet  Take 1 tablet (1,000 mg total) by mouth 2 (two) times daily with a meal.   180 tablet   1   .  Multiple Vitamins-Minerals (CENTRUM SILVER PO)  Take 1 tablet by mouth daily.         Marland Kitchen  OLANZapine (ZYPREXA) 5 MG tablet  Take 5 mg by mouth at bedtime.          Marland Kitchen  omega-3 acid ethyl esters (LOVAZA) 1 G capsule  Take 1 g by mouth 2 (two) times daily.         .  ondansetron (ZOFRAN) 4 MG tablet  Take 1 tablet (4 mg total) by mouth every 6 (six) hours.   12 tablet   0   .  Specialty Vitamins Products (ICAPS LUTEIN-ZEAXANTHIN) TBCR  Take 1 tablet by mouth daily.         Marland Kitchen  tiotropium (SPIRIVA HANDIHALER) 18 MCG inhalation capsule  Place 1 capsule (18 mcg total) into inhaler and inhale daily.   90 capsule   3   .  ZETIA 10 MG tablet  TAKE 1 TABLET BY MOUTH EVERY DAY   90 tablet   0   .  glucose blood test strip  Use as instructed ACCUCHECK AVIVA TEST STRIPS , USE AS DIRECTED ONE TIME A DAY,  DIAGNOSIS CODE 250.0.   100 each   6   .  HYDROcodone-acetaminophen (NORCO/VICODIN) 5-325 MG per tablet  TAKE 1 TABLET BY MOUTH EVERY 8 HOURS   90 tablet   0   .  insulin glargine (LANTUS) 100 UNIT/ML injection  Inject 0.5 mLs (50 Units total) into the skin at bedtime.   40 mL   1   .  [DISCONTINUED] Calcium Carbonate-Vitamin D (CALCIUM 600/VITAMIN D) 600-400 MG-UNIT per tablet  Take 1 tablet by mouth 2 (two) times daily.           .  [DISCONTINUED] sodium chloride (OCEAN) 0.65 % nasal spray  Place 2 sprays into the nose as needed. TO OPEN NASAL BREATHING             No current facility-administered medications for this visit.  Allergies as of 07/12/2013 - Review Complete 07/12/2013   Allergen  Reaction  Noted   .  Aspirin  Itching     .  Codeine  Itching     .  Demerol  Itching  01/12/2012   .  Latex  Itching     .  Lisinopril  Itching and Cough         reports that she quit smoking about 16 years ago. She has never used smokeless tobacco. She reports that she does not drink alcohol or use illicit drugs.         Review of Systems: She complains of low back pain Pertinent positive and negative review of systems were noted in the above HPI section. All other review of systems were otherwise negative.   Vital signs were reviewed in today's medical  record Physical Exam: General: Well developed , well nourished, no acute distress Skin: anicteric Head: Normocephalic and atraumatic Eyes:  sclerae anicteric, EOMI Ears: Normal auditory acuity Mouth: No deformity or lesions Neck: Supple, no masses or thyromegaly Lungs: Clear throughout to auscultation Heart: Regular rate and rhythm; no murmurs, rubs or bruits Abdomen: Soft, non tender and non distended. No masses, hepatosplenomegaly or hernias noted. Normal Bowel sounds Rectal:deferred Musculoskeletal: Symmetrical with no gross deformities   Skin: No lesions on visible extremities Pulses:  Normal pulses noted Extremities: No clubbing, cyanosis, edema or deformities noted Neurological: Alert oriented x 4, grossly nonfocal.  She has a resting tremor Cervical Nodes:  No significant cervical adenopathy Inguinal Nodes: No significant inguinal adenopathy Psychological:  Alert and cooperative. Normal mood and affect     Personal history of colonic polyps - Louis Meckel, MD at 07/12/2013 11:43 AM   Status: Written Related Problem: Personal history of colonic polyps        Plan followup colonoscopy        Diarrhea (Resolved 08/20/2013) - Louis Meckel, MD at 07/13/2013 8:43 AM    Status: Linus Orn Related Problem: Diarrhea         Six-month history of diarrhea with occasional incontinence. A structural abnormality of the colon should be ruled out. She'll overgrowth related to diabetes is a possibility. Colitis should be ruled out as well.  Recommendations  #1 colonoscopy

## 2013-09-07 ENCOUNTER — Encounter (HOSPITAL_COMMUNITY): Payer: Self-pay | Admitting: Gastroenterology

## 2013-09-07 ENCOUNTER — Telehealth: Payer: Self-pay | Admitting: Internal Medicine

## 2013-09-07 MED ORDER — AMOXICILLIN-POT CLAVULANATE 875-125 MG PO TABS: 1.0000 | ORAL_TABLET | Freq: Two times a day (BID) | ORAL | Status: DC

## 2013-09-07 NOTE — Telephone Encounter (Signed)
Per CY-give patient Augmentin 875 mg #14 take 1 po BID no refills.  

## 2013-09-07 NOTE — Telephone Encounter (Signed)
Pt started Doxycycline on 09-01-13.  Has 3 pills left.  Still c/o prod cough (green), wheezing, nasal congestion, Fever of 102.  Denies sob.  Pt doesn't feel like current abx is helping .  Please advise. Allergies  Allergen Reactions  . Aspirin Itching  . Codeine Itching  . Demerol Itching    Unknown-unconscious.  . Latex Itching  . Lisinopril Itching and Cough    REACTION: causes cough

## 2013-09-07 NOTE — Telephone Encounter (Signed)
PT notified of Dr Maple Hudson recommendations.  Rx sent

## 2013-09-10 ENCOUNTER — Encounter: Payer: Self-pay | Admitting: Gastroenterology

## 2013-09-14 IMAGING — CT CT ABD-PELV W/ CM
1 of 3 series · 14 of 32 positions shown, 19 images · IV contrast (APPLIED)
Comparison: 04/14/2012

CLINICAL DATA: Diffuse abdominal pain, nausea, vomiting, diarrhea

CT ABDOMEN AND PELVIS WITH CONTRAST
TECHNIQUE: Multidetector CT imaging of the abdomen and pelvis was
performed following the standard protocol during bolus
administration of intravenous contrast.
Contrast: 100mL OMNIPAQUE IOHEXOL 300 MG/ML  SOLN

[Series 2: abd/pelv with 5.0 b31f st · axial · 0.88mm/px · z∈[-458,-72]mm · 14 of 87 slices shown, 19 images]
[im 5/87  soft-tissue]
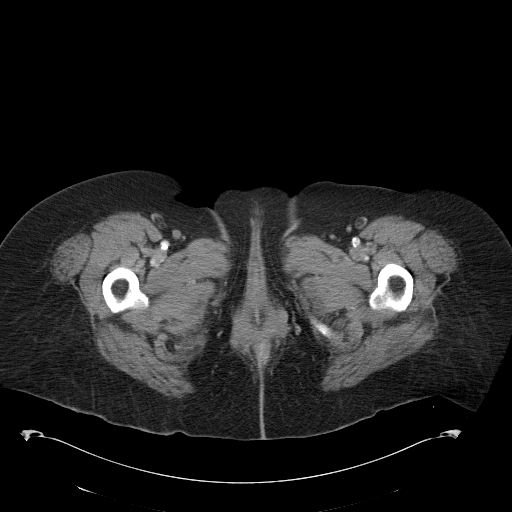
[im 5/87  bone]
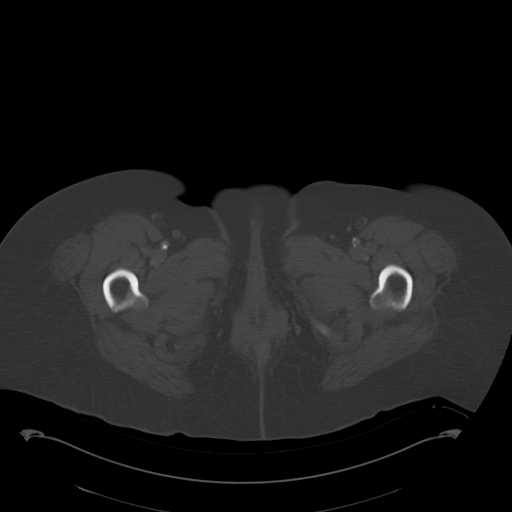
[im 10/87  soft-tissue]
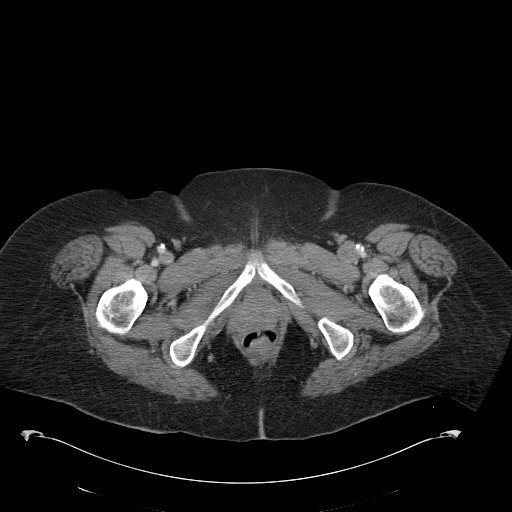
[im 20/87  soft-tissue]
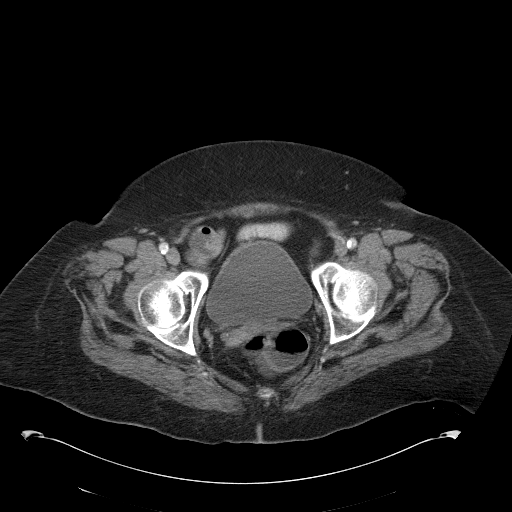
[im 24/87  soft-tissue]
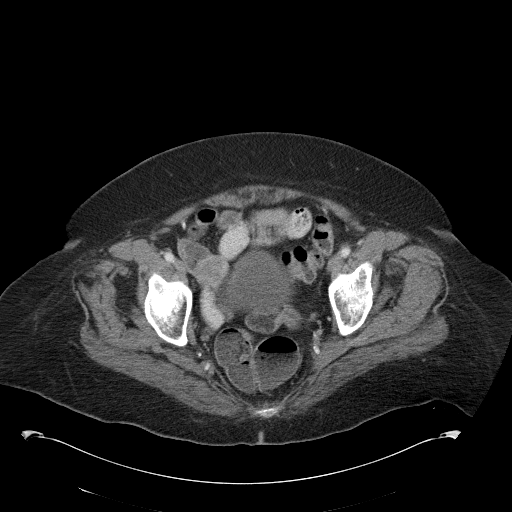
[im 29/87  soft-tissue]
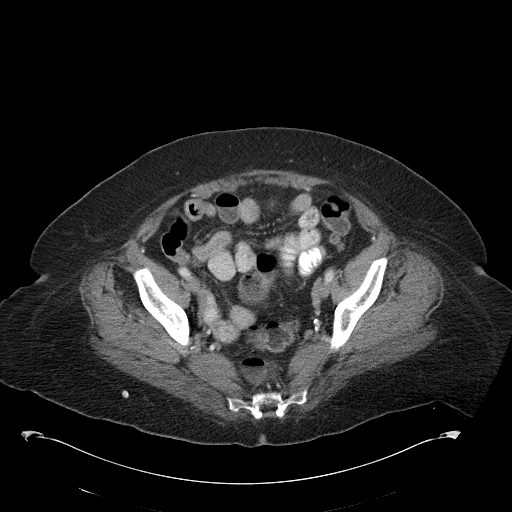
[im 39/87  soft-tissue]
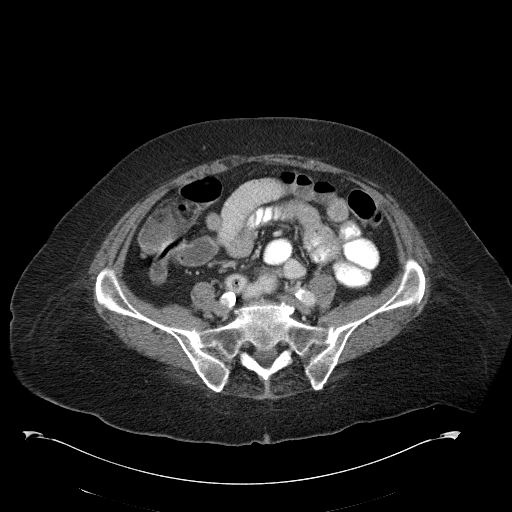
[im 44/87  soft-tissue]
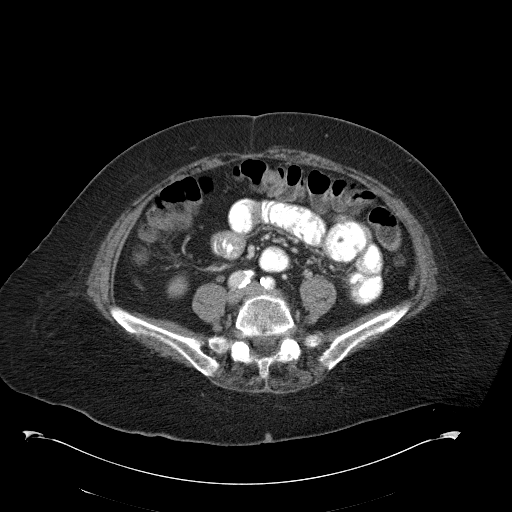
[im 48/87  soft-tissue]
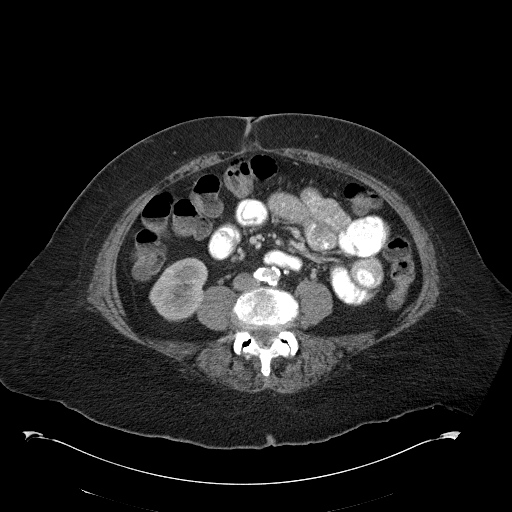
[im 58/87  soft-tissue]
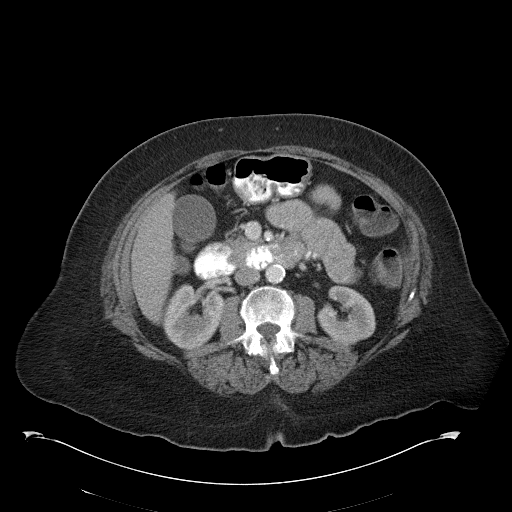
[im 58/87  bone]
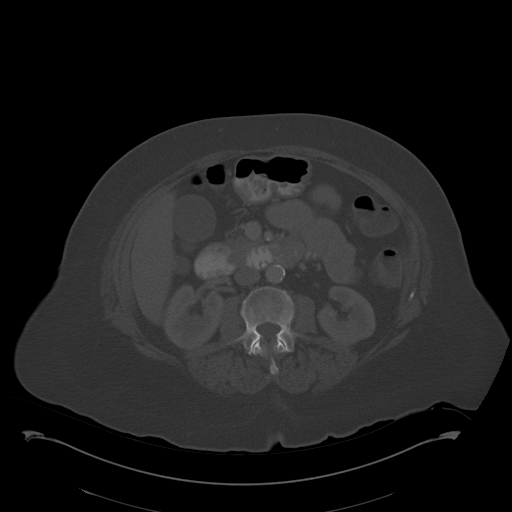
[im 63/87  soft-tissue]
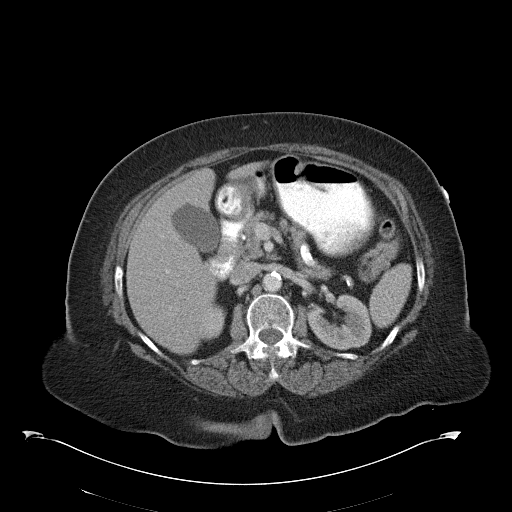
[im 67/87  soft-tissue]
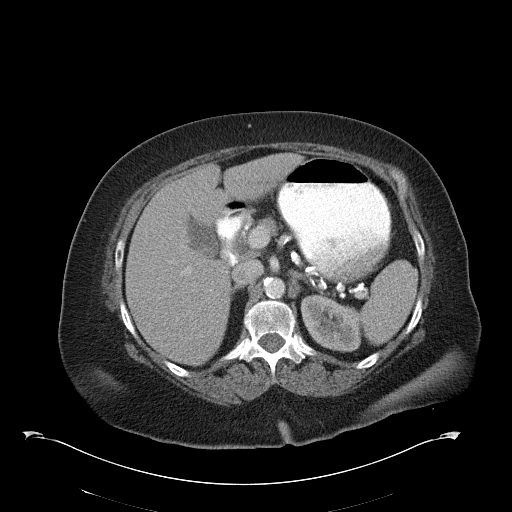
[im 67/87  lung]
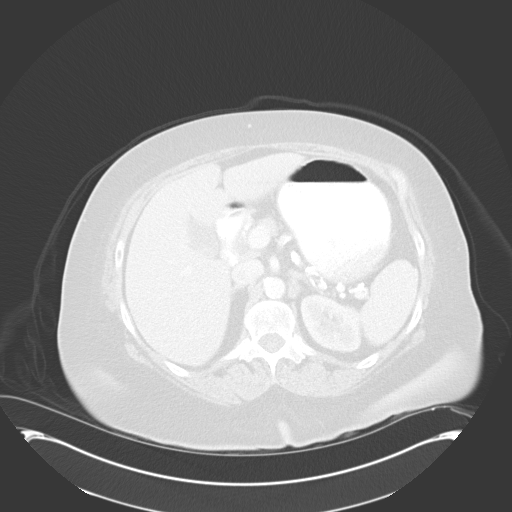
[im 72/87  lung]
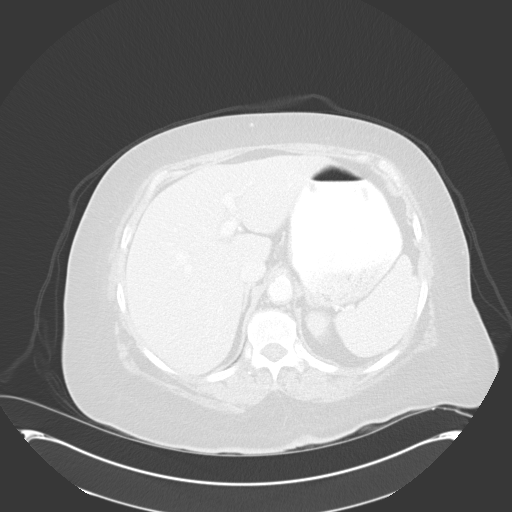
[im 77/87  soft-tissue]
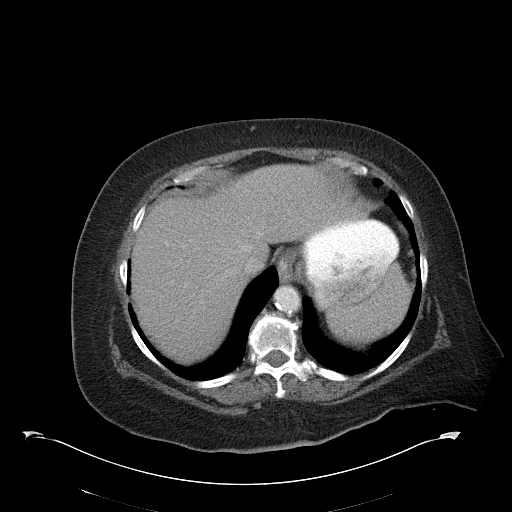
[im 77/87  lung]
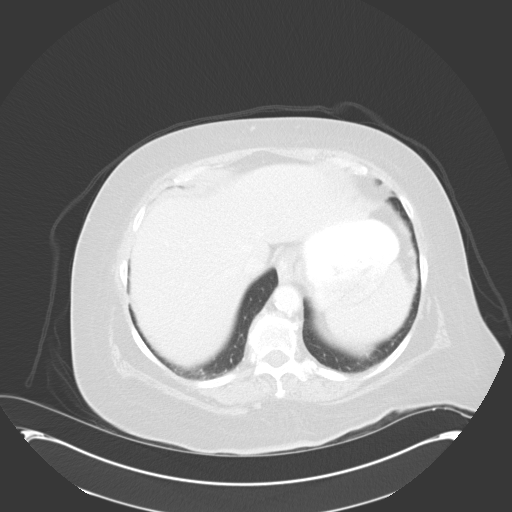
[im 82/87  soft-tissue]
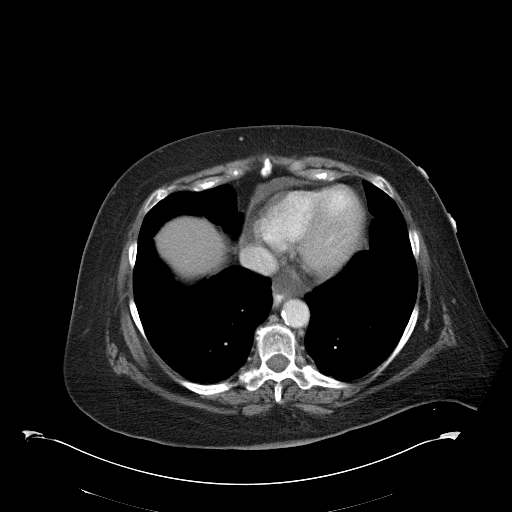
[im 82/87  lung]
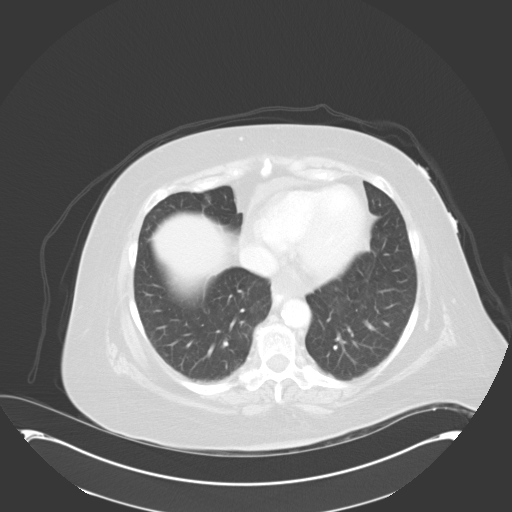

[14 of 32 positions shown; findings below may reference images not displayed]

FINDINGS: Sagittal images of the spine shows degenerative changes
thoracolumbar spine. Small anterior pericardial effusion is noted.

Atherosclerotic calcifications of the abdominal aorta, and celiac
trunk origin, bilateral renal artery origin, splenic artery and
iliac arteries are noted.

Lung bases are unremarkable.

Liver, spleen, pancreas and adrenal glands are unremarkable.

No aortic aneurysm.  Mild distended jejunal loops are noted without
evidence of
Of small bowel obstruction.  Nonspecific mild enteritis cannot be
excluded.  There is no pericecal inflammation.  Normal appendix is
clearly visualized in axial image 46.  No ascites or free air.  No
adenopathy.

Kidneys are symmetrical in size and enhancement.  No hydronephrosis
or hydroureter.

Delayed renal images shows bilateral renal symmetrical excretion.
Bilateral visualized proximal ureter is unremarkable.

There is no distal colonic obstruction.  No diverticulitis or
colitis.  The urinary bladder is unremarkable.  Atherosclerotic
calcifications of femoral arteries.  No inguinal adenopathy.

The patient is status post hysterectomy.
IMPRESSION: 1.  Nonspecific mild distended jejunal loops without evidence of
small bowel obstruction  or  surrounding inflammation.  Nonspecific
mild enteritis cannot be excluded.
2.  No hydronephrosis or hydroureter.
3.  Atherosclerotic vascular calcifications are noted.
4.  Normal appendix is clearly visualized.
5.  Status post hysterectomy.
6.  No evidence of colitis or diverticulitis.

## 2013-09-15 ENCOUNTER — Other Ambulatory Visit: Payer: Self-pay | Admitting: Internal Medicine

## 2013-09-15 ENCOUNTER — Ambulatory Visit (INDEPENDENT_AMBULATORY_CARE_PROVIDER_SITE_OTHER): Payer: Medicare Other

## 2013-09-15 DIAGNOSIS — J309 Allergic rhinitis, unspecified: Secondary | ICD-10-CM

## 2013-09-19 ENCOUNTER — Ambulatory Visit: Payer: Medicare Other

## 2013-09-21 ENCOUNTER — Ambulatory Visit (INDEPENDENT_AMBULATORY_CARE_PROVIDER_SITE_OTHER): Payer: Medicare Other

## 2013-09-21 DIAGNOSIS — Z23 Encounter for immunization: Secondary | ICD-10-CM

## 2013-09-21 DIAGNOSIS — E538 Deficiency of other specified B group vitamins: Secondary | ICD-10-CM

## 2013-09-21 DIAGNOSIS — J309 Allergic rhinitis, unspecified: Secondary | ICD-10-CM

## 2013-09-21 MED ORDER — CYANOCOBALAMIN 1000 MCG/ML IJ SOLN
1000.0000 ug | Freq: Once | INTRAMUSCULAR | Status: AC
  Administered 2013-09-21: 1000 ug via INTRAMUSCULAR

## 2013-09-22 ENCOUNTER — Ambulatory Visit: Payer: Medicare Other

## 2013-09-23 ENCOUNTER — Emergency Department (HOSPITAL_COMMUNITY)
Admission: EM | Admit: 2013-09-23 | Discharge: 2013-09-23 | Disposition: A | Discharge status: Home / Self Care | Payer: Medicare Other | Attending: Emergency Medicine | Admitting: Emergency Medicine

## 2013-09-23 ENCOUNTER — Encounter (HOSPITAL_COMMUNITY): Payer: Self-pay | Admitting: Emergency Medicine

## 2013-09-23 DIAGNOSIS — E1169 Type 2 diabetes mellitus with other specified complication: Secondary | ICD-10-CM | POA: Insufficient documentation

## 2013-09-23 DIAGNOSIS — Z79899 Other long term (current) drug therapy: Secondary | ICD-10-CM | POA: Insufficient documentation

## 2013-09-23 DIAGNOSIS — F411 Generalized anxiety disorder: Secondary | ICD-10-CM | POA: Insufficient documentation

## 2013-09-23 DIAGNOSIS — J4489 Other specified chronic obstructive pulmonary disease: Secondary | ICD-10-CM | POA: Insufficient documentation

## 2013-09-23 DIAGNOSIS — K219 Gastro-esophageal reflux disease without esophagitis: Secondary | ICD-10-CM | POA: Insufficient documentation

## 2013-09-23 DIAGNOSIS — Z794 Long term (current) use of insulin: Secondary | ICD-10-CM | POA: Insufficient documentation

## 2013-09-23 DIAGNOSIS — J449 Chronic obstructive pulmonary disease, unspecified: Secondary | ICD-10-CM | POA: Insufficient documentation

## 2013-09-23 DIAGNOSIS — I10 Essential (primary) hypertension: Secondary | ICD-10-CM | POA: Insufficient documentation

## 2013-09-23 DIAGNOSIS — I498 Other specified cardiac arrhythmias: Secondary | ICD-10-CM | POA: Insufficient documentation

## 2013-09-23 DIAGNOSIS — Z860100 Personal history of colon polyps, unspecified: Secondary | ICD-10-CM | POA: Insufficient documentation

## 2013-09-23 DIAGNOSIS — Z792 Long term (current) use of antibiotics: Secondary | ICD-10-CM | POA: Insufficient documentation

## 2013-09-23 DIAGNOSIS — F329 Major depressive disorder, single episode, unspecified: Secondary | ICD-10-CM | POA: Insufficient documentation

## 2013-09-23 DIAGNOSIS — E162 Hypoglycemia, unspecified: Secondary | ICD-10-CM

## 2013-09-23 DIAGNOSIS — IMO0002 Reserved for concepts with insufficient information to code with codable children: Secondary | ICD-10-CM | POA: Insufficient documentation

## 2013-09-23 DIAGNOSIS — F3289 Other specified depressive episodes: Secondary | ICD-10-CM | POA: Insufficient documentation

## 2013-09-23 DIAGNOSIS — Z91040 Latex allergy status: Secondary | ICD-10-CM | POA: Insufficient documentation

## 2013-09-23 DIAGNOSIS — D509 Iron deficiency anemia, unspecified: Secondary | ICD-10-CM | POA: Insufficient documentation

## 2013-09-23 DIAGNOSIS — E039 Hypothyroidism, unspecified: Secondary | ICD-10-CM | POA: Insufficient documentation

## 2013-09-23 DIAGNOSIS — M129 Arthropathy, unspecified: Secondary | ICD-10-CM | POA: Insufficient documentation

## 2013-09-23 DIAGNOSIS — E785 Hyperlipidemia, unspecified: Secondary | ICD-10-CM | POA: Insufficient documentation

## 2013-09-23 DIAGNOSIS — Z87891 Personal history of nicotine dependence: Secondary | ICD-10-CM | POA: Insufficient documentation

## 2013-09-23 LAB — CBC
HCT: 43.2 % (ref 36.0–46.0)
Hemoglobin: 14.6 g/dL (ref 12.0–15.0)
MCV: 93.1 fL (ref 78.0–100.0)
RBC: 4.64 MIL/uL (ref 3.87–5.11)
RDW: 13.3 % (ref 11.5–15.5)
WBC: 10.7 10*3/uL — ABNORMAL HIGH (ref 4.0–10.5)

## 2013-09-23 LAB — URINALYSIS, ROUTINE W REFLEX MICROSCOPIC
Glucose, UA: NEGATIVE mg/dL
Ketones, ur: NEGATIVE mg/dL
Protein, ur: NEGATIVE mg/dL
Urobilinogen, UA: 0.2 mg/dL (ref 0.0–1.0)

## 2013-09-23 LAB — URINE MICROSCOPIC-ADD ON

## 2013-09-23 LAB — COMPREHENSIVE METABOLIC PANEL
Albumin: 3.7 g/dL (ref 3.5–5.2)
Alkaline Phosphatase: 76 U/L (ref 39–117)
BUN: 25 mg/dL — ABNORMAL HIGH (ref 6–23)
CO2: 24 mEq/L (ref 19–32)
Chloride: 103 mEq/L (ref 96–112)
GFR calc non Af Amer: 58 mL/min — ABNORMAL LOW (ref >90)
Glucose, Bld: 113 mg/dL — ABNORMAL HIGH (ref 70–99)
Potassium: 4 mEq/L (ref 3.5–5.1)
Total Bilirubin: 0.5 mg/dL (ref 0.3–1.2)

## 2013-09-23 LAB — GLUCOSE, CAPILLARY
Glucose-Capillary: 114 mg/dL — ABNORMAL HIGH (ref 70–99)
Glucose-Capillary: 110 mg/dL — ABNORMAL HIGH (ref 70–99)
Glucose-Capillary: 151 mg/dL — ABNORMAL HIGH (ref 70–99)

## 2013-09-23 NOTE — ED Notes (Signed)
Received pt from home with c/o hypoglycemia and bradycardia. Pt reports got up today to eat but could not make it. Pt sat herself on the floor and yelled out for family. Family found pt sitting on floor. CBG by family 55. Family gave pt peanut butter sandwich and juice. Upon arrival of EMS pt CBG 28. Pt given D50 by EMS CBG 231. Upon arrival to ED, EMS rechecked CBG 64. Pt started on 10% dextrose IV infusion. CBG checked her in ED 115. Pt c/o dizziness, family reports pt started on new medication x 1 month and has had 3 episodes of low blood sugar since.

## 2013-09-23 NOTE — ED Notes (Signed)
Pt is eating her lunch tray. Pt family is at bedside.

## 2013-09-23 NOTE — ED Provider Notes (Signed)
CSN: [id number]     Arrival date & time 09/23/13  0920 History   First MD Initiated Contact with Patient 09/23/13 [phone]     Chief Complaint  Patient presents with  . Hypoglycemia  . Bradycardia   (Consider location/radiation/quality/duration/timing/severity/associated sxs/prior Treatment) HPI Comments: Patient is a 67 year old female with a past medical history of iron deficiency anemia, diabetes, hypertension, anxiety, GERD and arthritis who presents to the emergency department via EMS with hypoglycemia and bradycardia. Patient states she woke up from sleep this morning to go eat breakfast, however felt weak and could not make it down the stairs. She then sat herself on the floor and yelled out for family who checked her sugar which was 36, she was then given a peanut butter sandwich and juice, EMS arrived 5 minutes after and her CBG was 28. EMS gave D50 which made her CBG increased to 231, however once arrived to the ED her CBG was 64. She was then started on D10 IV infusion, current CBG 115. Patient is currently complaining of dizziness and lightheadedness, otherwise feeling fine. She did start on valsartan one month ago and has had 3 similar episodes since. No change in insulin, takes 50 units of insulin at night, has not taken any extra insulin.  Patient is a 67 y.o. female presenting with hypoglycemia. The history is provided by the patient and the EMS personnel.  Hypoglycemia Associated symptoms: dizziness     Past Medical History  Diagnosis Date  . Personal history of colonic polyps   . Iron deficiency anemia, unspecified   . Other malaise and fatigue   . Chest pain, unspecified   . Other B-complex deficiencies   . Arthritis     Right Sacroiliac joint  . Other and unspecified hyperlipidemia   . Anxiety and depression   . HTN (hypertension)   . Unspecified hypothyroidism   . Type II or unspecified type diabetes mellitus without mention of complication, not stated as uncontrolled    . Allergic rhinitis, cause unspecified   . Unspecified asthma(493.90)   . Chronic airway obstruction, not elsewhere classified   . Abnormal stress test 08/21/2008    Neg  . GERD (gastroesophageal reflux disease)    Past Surgical History  Procedure Laterality Date  . Foot surgery      hammer toe 2004, 2005  . Abdominal hysterectomy  1982    menometorrhagia  . Carpal tunnel release  03/05/2009    bilateral  . Colonoscopy N/A 09/06/2013    Procedure: COLONOSCOPY;  Surgeon: Louis Meckel, MD;  Location: [facility];  Service: Endoscopy;  Laterality: N/A;  . Hot hemostasis N/A 09/06/2013    Procedure: HOT HEMOSTASIS (ARGON PLASMA COAGULATION/BICAP);  Surgeon: Louis Meckel, MD;  Location: [facility];  Service: Endoscopy;  Laterality: N/A;   Family History  Problem Relation Age of Onset  . Hypertension Mother   . Diabetes Mother   . Heart disease Mother     CHF  . Coronary artery disease Mother   . Lung cancer Father   . Hypertension Father   . Diabetes Father   . Liver cancer Father     w/mets  . Prostate cancer Father   . Breast cancer Sister   . Lung disease Brother     Agent Orange Related  . Asthma Brother   . Allergies Brother   . Colon cancer Neg Hx    History  Substance Use Topics  . Smoking status: Former Smoker    Quit date: 06/01/1997  .  Smokeless tobacco: Never Used     Comment: Quit 9 years ago.  . Alcohol Use: No   OB History   Grav Para Term Preterm Abortions TAB SAB Ect Mult Living                 Review of Systems  Neurological: Positive for dizziness and light-headedness.  All other systems reviewed and are negative.    Allergies  Aspirin; Codeine; Demerol; Latex; and Lisinopril  Home Medications   Current Outpatient Rx  Name  Route  Sig  Dispense  Refill  . azelastine (ASTELIN) 137 MCG/SPRAY nasal spray      Place 1 spray into the nose at bedtime. Use in each nostril as directed.   30 mL   1     PATIENT DUE FOR A ROUTINE  PHYSICAL EXAM   . budesonide-formoterol (SYMBICORT) 80-4.5 MCG/ACT inhaler   Inhalation   Inhale 2 puffs into the lungs 2 (two) times daily.   10.2 g   11   . calcium carbonate (OS-CAL) 600 MG TABS tablet   Oral   Take 600 mg by mouth daily with breakfast.         . clonazePAM (KLONOPIN) 1 MG tablet   Oral   Take 1 mg by mouth at bedtime as needed for anxiety.         . dicyclomine (BENTYL) 10 MG capsule   Oral   Take 1 capsule (10 mg total) by mouth 2 (two) times daily.   60 capsule   0     No further refills until pt seen in office . Pt ca ...   . ezetimibe (ZETIA) 10 MG tablet   Oral   Take 10 mg by mouth daily.         . fish oil-omega-3 fatty acids 1000 MG capsule   Oral   Take 1 g by mouth daily.         . furosemide (LASIX) 40 MG tablet   Oral   Take 40 mg by mouth daily.         Marland Kitchen glucose blood test strip      Use as instructed ACCUCHECK AVIVA TEST STRIPS , USE AS DIRECTED ONE TIME A DAY,  DIAGNOSIS CODE 250.0.   100 each   6   . HYDROcodone-acetaminophen (NORCO/VICODIN) 5-325 MG per tablet   Oral   Take 1 tablet by mouth every 8 (eight) hours as needed for pain.         Marland Kitchen insulin glargine (LANTUS) 100 UNIT/ML injection   Subcutaneous   Inject 0.5 mLs (50 Units total) into the skin at bedtime.   40 mL   1   . levalbuterol (XOPENEX) 0.63 MG/3ML nebulizer solution   Nebulization   Take 1 ampule by nebulization every 6 (six) hours as needed. For wheezing         . levothyroxine (SYNTHROID) 88 MCG tablet   Oral   Take 1 tablet (88 mcg total) by mouth daily.   90 tablet   3   . memantine (NAMENDA) 10 MG tablet   Oral   Take 10 mg by mouth 2 (two) times daily.         . metFORMIN (GLUCOPHAGE) 1000 MG tablet   Oral   Take 1 tablet (1,000 mg total) by mouth 2 (two) times daily with a meal.   180 tablet   1   . Multiple Vitamins-Minerals (CENTRUM SILVER PO)   Oral   Take 1  tablet by mouth daily.         Marland Kitchen omega-3 acid ethyl  esters (LOVAZA) 1 G capsule   Oral   Take 1 g by mouth 2 (two) times daily.         . ondansetron (ZOFRAN) 4 MG tablet   Oral   Take 1 tablet (4 mg total) by mouth every 6 (six) hours.   12 tablet   0   . pioglitazone (ACTOS) 30 MG tablet   Oral   Take 1 tablet (30 mg total) by mouth daily.   30 tablet   0   . tiotropium (SPIRIVA HANDIHALER) 18 MCG inhalation capsule   Inhalation   Place 1 capsule (18 mcg total) into inhaler and inhale daily.   90 capsule   3   . valsartan (DIOVAN) 160 MG tablet   Oral   Take 160 mg by mouth daily.         . vitamin A 8000 UNIT capsule   Oral   Take 8,000 Units by mouth daily.         Marland Kitchen amoxicillin-clavulanate (AUGMENTIN) 875-125 MG per tablet   Oral   Take 1 tablet by mouth 2 (two) times daily.   14 tablet   0   . doxycycline (VIBRAMYCIN) 100 MG capsule      Take 2 capsules today then 1 daily until gone   8 capsule   0    BP 146/59  Pulse 64  Temp(Src) 97.5 F (36.4 C) (Oral)  Resp 17  Ht 5\' 4"  (1.626 m)  Wt 175 lb (79.379 kg)  BMI 30.02 kg/m2  SpO2 100% Physical Exam  Nursing note and vitals reviewed. Constitutional: She is oriented to person, place, and time. She appears well-developed and well-nourished. No distress.  HENT:  Head: Normocephalic and atraumatic.  Mouth/Throat: Oropharynx is clear and moist.  Eyes: Conjunctivae and EOM are normal. Pupils are equal, round, and reactive to light.  Neck: Normal range of motion. Neck supple.  Cardiovascular: Regular rhythm, normal heart sounds, intact distal pulses and normal pulses.  Bradycardia present.   Pulmonary/Chest: Effort normal and breath sounds normal.  Abdominal: Soft. Bowel sounds are normal. There is no tenderness.  Musculoskeletal: Normal range of motion. She exhibits no edema.  Neurological: She is alert and oriented to person, place, and time. She has normal strength. No cranial nerve deficit. GCS eye subscore is 4. GCS verbal subscore is 5. GCS  motor subscore is 6.  Moves limbs without ataxia.  Skin: Skin is warm and dry. She is not diaphoretic.  Psychiatric: She has a normal mood and affect. Her behavior is normal.    ED Course  Procedures (including critical care time) Labs Review Labs Reviewed  CBC - Abnormal; Notable for the following:    WBC 10.7 (*)    All other components within normal limits  COMPREHENSIVE METABOLIC PANEL - Abnormal; Notable for the following:    Glucose, Bld 113 (*)    BUN 25 (*)    GFR calc non Af Amer 58 (*)    GFR calc Af Amer 67 (*)    All other components within normal limits  URINALYSIS, ROUTINE W REFLEX MICROSCOPIC - Abnormal; Notable for the following:    APPearance CLOUDY (*)    Leukocytes, UA LARGE (*)    All other components within normal limits  GLUCOSE, CAPILLARY - Abnormal; Notable for the following:    Glucose-Capillary 115 (*)    All other components within normal  limits  GLUCOSE, CAPILLARY - Abnormal; Notable for the following:    Glucose-Capillary 114 (*)    All other components within normal limits  URINE MICROSCOPIC-ADD ON - Abnormal; Notable for the following:    Squamous Epithelial / LPF MANY (*)    All other components within normal limits  GLUCOSE, CAPILLARY - Abnormal; Notable for the following:    Glucose-Capillary 110 (*)    All other components within normal limits  GLUCOSE, CAPILLARY - Abnormal; Notable for the following:    Glucose-Capillary 151 (*)    All other components within normal limits  GLUCOSE, CAPILLARY   Imaging Review No results found.  EKG Interpretation     Ventricular Rate:  62 PR Interval:  67 QRS Duration: 118 QT Interval:  422 QTC Calculation: 428 R Axis:   -54 Text Interpretation:  Age not entered, assumed to be  67 years old for purpose of ECG interpretation Second degree AV block, Mobitz II Atrial premature complexes LVH with IVCD, LAD and secondary repol abnrm No significant change since last tracing Abnormal ekg             MDM   1. Hypoglycemia     Patient with hypoglycemia. She is well appearing and in no apparent distress. Alert and oriented x3. CBG currently 115, D10 infusion. Will continue to monitor glucose level, labs pending- cbc, cmp, ua. EKG. 1:26 PM GBG remains stable from 90-151. Most recent 151 after eating lunch. She is AAOx3, comfortable and in NAD. UA with many squamous cells, rare bacteria, 21-50 white cells and large leukocytes, nitrite negative. Urine culture pending, will hold on treatment at this time. Asymptomatic. She is stable for discharge home, advised close monitoring of her sugar, follow up with PCP to discuss medication changes. Vitals stable. Patient discussed with my attending Dr. Jeraldine Loots who agrees with plan of care. Return precautions discussed. Patient and family state understanding of plan and are agreeable.  Trevor Mace, PA-C 09/23/13 1328

## 2013-09-23 NOTE — ED Notes (Addendum)
Pt provided cranberry juice. Ordered meal for patient.

## 2013-09-23 NOTE — ED Notes (Signed)
Pt is a x 4. Denies any pain. Pt CBG is 114. Family is at bedside.

## 2013-09-23 NOTE — ED Notes (Signed)
Pt provided coffee  

## 2013-09-24 NOTE — ED Provider Notes (Signed)
  This was a shared visit with a mid-level provided (NP or PA).  Throughout the patient's course I was available for consultation/collaboration.  I saw the ECG (rate 60, sinus, PA-C, IVCD, abnormal), relevant labs and studies - I agree with the interpretation.  On my exam the patient was in no distress.  At that point her glucose was greater than 100, she was eating, and had no ongoing complaints. Per the patient's ED course with monitoring, she had no decompensation, and had several hours of appropriate glucose levels.  We had multiple discussions with the patient and family about medication reconciliation, close monitoring, primary care followup.  No evidence of distress, and with persistent euglycemia, she was discharged to follow up with her primary care team.  The patient had a heart rate in the upper 50s to low 60s, with no chest pain, no dyspnea, there is low suspicion for occult dysrhythmia.     Gerhard Munch, MD 09/24/13 [phone]

## 2013-09-25 LAB — URINE CULTURE: Colony Count: 9000

## 2013-09-26 ENCOUNTER — Telehealth: Payer: Self-pay | Admitting: Internal Medicine

## 2013-09-26 ENCOUNTER — Encounter: Payer: Self-pay | Admitting: Internal Medicine

## 2013-09-26 ENCOUNTER — Ambulatory Visit (INDEPENDENT_AMBULATORY_CARE_PROVIDER_SITE_OTHER): Payer: Medicare Other | Admitting: Internal Medicine

## 2013-09-26 VITALS — BP 122/80 | HR 60 | Temp 97.5°F | Wt 183.8 lb

## 2013-09-26 DIAGNOSIS — E119 Type 2 diabetes mellitus without complications: Secondary | ICD-10-CM

## 2013-09-26 MED ORDER — PIOGLITAZONE HCL 15 MG PO TABS: 15.0000 mg | ORAL_TABLET | Freq: Every day | ORAL | Status: DC

## 2013-09-26 NOTE — Telephone Encounter (Signed)
Phone call to patient letting her know she will need an appt. She says she will call back to schedule.

## 2013-09-26 NOTE — Telephone Encounter (Signed)
This is a viral pattern URI. Antibiotics won't help at this stage. Suggest Mucinex, fluids.

## 2013-09-26 NOTE — Telephone Encounter (Signed)
Spoke with pt-- Pt c/o increased cough, runny nose, chest/head congestion and HA x 1 day. Pt denies change in color of mucus.  Requesting something called into pharmacy.   [facility] Allergies  Allergen Reactions  . Aspirin Itching  . Codeine Itching  . Demerol Itching    Unknown-unconscious.  . Latex Itching  . Lisinopril Itching and Cough    REACTION: causes cough   Please advise Dr Maple Hudson. Thanks.

## 2013-09-26 NOTE — Progress Notes (Signed)
Subjective:    Patient ID: [name], female    DOB: 08-12-1946, 67 y.o.   MRN: [id number]  HPI [name] has had a 2 month h/o intermittent low blood sugar. She has had to call EMS x 3 and this last time she was transported to ED 09/23/13: reviewed clinical notes and labs. She was kept for several hours, given IVF-D10 for several hours and was discharged after she ate a meal and her CBG was stable.   There was a change in medication: januvia 100 mg was stopped and she had been started on Actos 30 mg in recent weeks. She states she has 3 meals a day and a snack. No intercurrent illness reported. She has reduced Lantus from 50 to 40 units daily.  PMH, FamHx and SocHx reviewed for any changes and relevance.  Current Outpatient Prescriptions on File Prior to Visit  Medication Sig Dispense Refill  . azelastine (ASTELIN) 137 MCG/SPRAY nasal spray Place 1 spray into the nose at bedtime. Use in each nostril as directed.  30 mL  1  . budesonide-formoterol (SYMBICORT) 80-4.5 MCG/ACT inhaler Inhale 2 puffs into the lungs 2 (two) times daily.  10.2 g  11  . calcium carbonate (OS-CAL) 600 MG TABS tablet Take 600 mg by mouth daily with breakfast.      . clonazePAM (KLONOPIN) 1 MG tablet Take 1 mg by mouth at bedtime as needed for anxiety.      . dicyclomine (BENTYL) 10 MG capsule Take 1 capsule (10 mg total) by mouth 2 (two) times daily.  60 capsule  0  . doxycycline (VIBRAMYCIN) 100 MG capsule Take 2 capsules today then 1 daily until gone  8 capsule  0  . ezetimibe (ZETIA) 10 MG tablet Take 10 mg by mouth daily.      . fish oil-omega-3 fatty acids 1000 MG capsule Take 1 g by mouth daily.      . furosemide (LASIX) 40 MG tablet Take 40 mg by mouth daily.      Marland Kitchen glucose blood test strip Use as instructed ACCUCHECK AVIVA TEST STRIPS , USE AS DIRECTED ONE TIME A DAY,  DIAGNOSIS CODE 250.0.  100 each  6  . HYDROcodone-acetaminophen (NORCO/VICODIN) 5-325 MG per tablet Take 1 tablet by mouth every 8  (eight) hours as needed for pain.      Marland Kitchen insulin glargine (LANTUS) 100 UNIT/ML injection Inject 0.5 mLs (50 Units total) into the skin at bedtime.  40 mL  1  . levalbuterol (XOPENEX) 0.63 MG/3ML nebulizer solution Take 1 ampule by nebulization every 6 (six) hours as needed. For wheezing      . levothyroxine (SYNTHROID) 88 MCG tablet Take 1 tablet (88 mcg total) by mouth daily.  90 tablet  3  . memantine (NAMENDA) 10 MG tablet Take 10 mg by mouth 2 (two) times daily.      . metFORMIN (GLUCOPHAGE) 1000 MG tablet Take 1 tablet (1,000 mg total) by mouth 2 (two) times daily with a meal.  180 tablet  1  . Multiple Vitamins-Minerals (CENTRUM SILVER PO) Take 1 tablet by mouth daily.      Marland Kitchen omega-3 acid ethyl esters (LOVAZA) 1 G capsule Take 1 g by mouth 2 (two) times daily.      . ondansetron (ZOFRAN) 4 MG tablet Take 1 tablet (4 mg total) by mouth every 6 (six) hours.  12 tablet  0  . tiotropium (SPIRIVA HANDIHALER) 18 MCG inhalation capsule Place 1 capsule (18 mcg total) into  inhaler and inhale daily.  90 capsule  3  . valsartan (DIOVAN) 160 MG tablet Take 160 mg by mouth daily.      . vitamin A 8000 UNIT capsule Take 8,000 Units by mouth daily.      . [DISCONTINUED] Calcium Carbonate-Vitamin D (CALCIUM 600/VITAMIN D) 600-400 MG-UNIT per tablet Take 1 tablet by mouth 2 (two) times daily.        . [DISCONTINUED] sodium chloride (OCEAN) 0.65 % nasal spray Place 2 sprays into the nose as needed. TO OPEN NASAL BREATHING       No current facility-administered medications on file prior to visit.      Review of Systems System review is negative for any constitutional, cardiac, pulmonary, GI or neuro symptoms or complaints other than as described in the HPI.     Objective:   Physical Exam Filed Vitals:   09/26/13 1622  BP: 122/80  Pulse: 60  Temp: 97.5 F (36.4 C)   Wt Readings from Last 3 Encounters:  09/26/13 183 lb 12.8 oz (83.371 kg)  09/23/13 175 lb (79.379 kg)  09/06/13 175 lb (79.379 kg)    Gen'l - overweight woman in no distress. She did start to feel like her glucose was dropping - provided OJ and hard candy with improvement Cor - 2+ radial pulse Pulm - normal respirations Neuro - A&O x 3, speech clear, cognition normal.       Assessment & Plan:

## 2013-09-26 NOTE — Patient Instructions (Signed)
Diabetes management - the low blood sugar events require a change in your medications.  Plan Lantus - ok to continue at 40 units at bedtime  Metformin - continue 1,000 mg twice a day (does not cause low blood sugar)  Actos - reduce the dose to 15 mg daily: take 1/2 tablet of 30 mg until they are gone and the new Rx is for 15 mg   Check your before breakfast blood sugar to be sure the lantus dose is right - should be 110-130 fasting

## 2013-09-26 NOTE — Telephone Encounter (Signed)
Needs appointment

## 2013-09-26 NOTE — Telephone Encounter (Signed)
Pt advised. Jennifer Castillo, CMA  

## 2013-09-26 NOTE — Telephone Encounter (Signed)
09/26/2013   Pt called in stating that they are having trouble with medications.  Pt is diabetic and is having a lot of low readings; 09/23/2013 pt had a reading of 20 and had to go to hospital.  Would like Dr. Debby Bud to advise whether pt needs an appt

## 2013-09-27 ENCOUNTER — Encounter: Payer: Self-pay | Admitting: Gastroenterology

## 2013-09-27 ENCOUNTER — Ambulatory Visit (INDEPENDENT_AMBULATORY_CARE_PROVIDER_SITE_OTHER): Payer: Medicare Other | Admitting: Gastroenterology

## 2013-09-27 ENCOUNTER — Ambulatory Visit (INDEPENDENT_AMBULATORY_CARE_PROVIDER_SITE_OTHER): Payer: Medicare Other

## 2013-09-27 VITALS — BP 122/60 | HR 68 | Ht 63.0 in | Wt 185.0 lb

## 2013-09-27 DIAGNOSIS — K3184 Gastroparesis: Secondary | ICD-10-CM

## 2013-09-27 DIAGNOSIS — R197 Diarrhea, unspecified: Secondary | ICD-10-CM

## 2013-09-27 DIAGNOSIS — Z86010 Personal history of colonic polyps: Secondary | ICD-10-CM

## 2013-09-27 DIAGNOSIS — J309 Allergic rhinitis, unspecified: Secondary | ICD-10-CM

## 2013-09-27 MED ORDER — AMPICILLIN 250 MG PO CAPS: 250.0000 mg | ORAL_CAPSULE | Freq: Four times a day (QID) | ORAL | Status: DC

## 2013-09-27 NOTE — Assessment & Plan Note (Signed)
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  This in turn could cause diarrhea.  Recommendations #1 empiric trial of ampicillin 250 mg 4 times a day.  Patient was instructed to call back in one week

## 2013-09-27 NOTE — Assessment & Plan Note (Signed)
Plan follow-up colonoscopy 2019 

## 2013-09-27 NOTE — Progress Notes (Signed)
History of Present Illness:  The patient has returned following colonoscopy.  Random biopsies were negative.  She continues to have severe diarrhea.  Previous gastric emptying scan was pertinent for gastroparesis.  She denies nausea.    Review of Systems: Pertinent positive and negative review of systems were noted in the above HPI section. All other review of systems were otherwise negative.    Current Medications, Allergies, Past Medical History, Past Surgical History, Family History and Social History were reviewed in [facility] electronic medical record  Vital signs were reviewed in today's medical record. Physical Exam: General: Well developed , well nourished, no acute distress She has a resting tremor

## 2013-09-27 NOTE — Assessment & Plan Note (Signed)
Pt is asymptomatic at this time.  Since she already has a resting tremor would avoid reglan should she become symptomatic

## 2013-09-27 NOTE — Patient Instructions (Signed)
Call in 1 week to report progress Medication sent to pharmacy Follow up in 6 weeks

## 2013-09-27 NOTE — Assessment & Plan Note (Signed)
Patient with several recent episodes of hypoglycemia, last two w/o warning symptoms. No intercurrent illness but there had been change in medication going from [place] to [place] to Actos. Lab Results  Component Value Date   HGBA1C 5.6 05/10/2013   Plan Continue lantus at 40 units; metformin 1,000 mg bid; reduce actos to 15 mg daily  Monitor fasting CBG - goal of 110-130.  Report back for minor problems of low CBGs - do not wait for major events.

## 2013-10-02 ENCOUNTER — Other Ambulatory Visit: Payer: Self-pay

## 2013-10-02 MED ORDER — HYDROCODONE-ACETAMINOPHEN 5-325 MG PO TABS: 1.0000 | ORAL_TABLET | Freq: Three times a day (TID) | ORAL | Status: DC | PRN

## 2013-10-02 NOTE — Telephone Encounter (Signed)
Patient calls for a refill on her Hydrocodone. Scripts have been printed and waiting for signature and will call patient when complete

## 2013-10-04 ENCOUNTER — Other Ambulatory Visit: Payer: Self-pay | Admitting: Internal Medicine

## 2013-10-05 NOTE — Telephone Encounter (Signed)
See note from 09-04-13

## 2013-10-07 ENCOUNTER — Other Ambulatory Visit: Payer: Self-pay | Admitting: Internal Medicine

## 2013-10-12 ENCOUNTER — Telehealth: Payer: Self-pay | Admitting: Internal Medicine

## 2013-10-12 DIAGNOSIS — D509 Iron deficiency anemia, unspecified: Secondary | ICD-10-CM

## 2013-10-12 DIAGNOSIS — E785 Hyperlipidemia, unspecified: Secondary | ICD-10-CM

## 2013-10-12 DIAGNOSIS — E039 Hypothyroidism, unspecified: Secondary | ICD-10-CM

## 2013-10-12 DIAGNOSIS — E119 Type 2 diabetes mellitus without complications: Secondary | ICD-10-CM

## 2013-10-12 DIAGNOSIS — E538 Deficiency of other specified B group vitamins: Secondary | ICD-10-CM

## 2013-10-12 NOTE — Telephone Encounter (Signed)
December '14. Orders entered

## 2013-10-12 NOTE — Telephone Encounter (Signed)
I'm sorry I think I hit the close encounter button

## 2013-10-12 NOTE — Telephone Encounter (Signed)
10/12/2013  Pt wants to know when Dr. Debby Bud wants pt to have blood work done.  Did not see any lab orders in chart.

## 2013-10-13 NOTE — Telephone Encounter (Signed)
PATIENT NOTIFIED.

## 2013-10-16 ENCOUNTER — Ambulatory Visit (INDEPENDENT_AMBULATORY_CARE_PROVIDER_SITE_OTHER): Payer: Medicare Other

## 2013-10-16 DIAGNOSIS — J309 Allergic rhinitis, unspecified: Secondary | ICD-10-CM

## 2013-10-24 ENCOUNTER — Ambulatory Visit (INDEPENDENT_AMBULATORY_CARE_PROVIDER_SITE_OTHER): Payer: Medicare Other

## 2013-10-24 DIAGNOSIS — J309 Allergic rhinitis, unspecified: Secondary | ICD-10-CM

## 2013-10-27 ENCOUNTER — Emergency Department (HOSPITAL_COMMUNITY): Payer: Medicare Other

## 2013-10-27 ENCOUNTER — Encounter (HOSPITAL_COMMUNITY): Payer: Self-pay | Admitting: Emergency Medicine

## 2013-10-27 ENCOUNTER — Emergency Department (HOSPITAL_COMMUNITY)
Admission: EM | Admit: 2013-10-27 | Discharge: 2013-10-27 | Disposition: A | Discharge status: Home / Self Care | Payer: Medicare Other | Attending: Emergency Medicine | Admitting: Emergency Medicine

## 2013-10-27 DIAGNOSIS — Z862 Personal history of diseases of the blood and blood-forming organs and certain disorders involving the immune mechanism: Secondary | ICD-10-CM | POA: Insufficient documentation

## 2013-10-27 DIAGNOSIS — Z792 Long term (current) use of antibiotics: Secondary | ICD-10-CM | POA: Insufficient documentation

## 2013-10-27 DIAGNOSIS — W2209XA Striking against other stationary object, initial encounter: Secondary | ICD-10-CM | POA: Insufficient documentation

## 2013-10-27 DIAGNOSIS — Z79899 Other long term (current) drug therapy: Secondary | ICD-10-CM | POA: Insufficient documentation

## 2013-10-27 DIAGNOSIS — Y939 Activity, unspecified: Secondary | ICD-10-CM | POA: Insufficient documentation

## 2013-10-27 DIAGNOSIS — J449 Chronic obstructive pulmonary disease, unspecified: Secondary | ICD-10-CM | POA: Insufficient documentation

## 2013-10-27 DIAGNOSIS — IMO0002 Reserved for concepts with insufficient information to code with codable children: Secondary | ICD-10-CM | POA: Insufficient documentation

## 2013-10-27 DIAGNOSIS — M129 Arthropathy, unspecified: Secondary | ICD-10-CM | POA: Insufficient documentation

## 2013-10-27 DIAGNOSIS — Z860100 Personal history of colon polyps, unspecified: Secondary | ICD-10-CM | POA: Insufficient documentation

## 2013-10-27 DIAGNOSIS — Z87891 Personal history of nicotine dependence: Secondary | ICD-10-CM | POA: Insufficient documentation

## 2013-10-27 DIAGNOSIS — K219 Gastro-esophageal reflux disease without esophagitis: Secondary | ICD-10-CM | POA: Insufficient documentation

## 2013-10-27 DIAGNOSIS — E119 Type 2 diabetes mellitus without complications: Secondary | ICD-10-CM | POA: Insufficient documentation

## 2013-10-27 DIAGNOSIS — S92309A Fracture of unspecified metatarsal bone(s), unspecified foot, initial encounter for closed fracture: Secondary | ICD-10-CM | POA: Insufficient documentation

## 2013-10-27 DIAGNOSIS — Z794 Long term (current) use of insulin: Secondary | ICD-10-CM | POA: Insufficient documentation

## 2013-10-27 DIAGNOSIS — J4489 Other specified chronic obstructive pulmonary disease: Secondary | ICD-10-CM | POA: Insufficient documentation

## 2013-10-27 DIAGNOSIS — Z8639 Personal history of other endocrine, nutritional and metabolic disease: Secondary | ICD-10-CM | POA: Insufficient documentation

## 2013-10-27 DIAGNOSIS — S92351A Displaced fracture of fifth metatarsal bone, right foot, initial encounter for closed fracture: Secondary | ICD-10-CM

## 2013-10-27 DIAGNOSIS — Y92009 Unspecified place in unspecified non-institutional (private) residence as the place of occurrence of the external cause: Secondary | ICD-10-CM | POA: Insufficient documentation

## 2013-10-27 DIAGNOSIS — F411 Generalized anxiety disorder: Secondary | ICD-10-CM | POA: Insufficient documentation

## 2013-10-27 DIAGNOSIS — I10 Essential (primary) hypertension: Secondary | ICD-10-CM | POA: Insufficient documentation

## 2013-10-27 DIAGNOSIS — Z91040 Latex allergy status: Secondary | ICD-10-CM | POA: Insufficient documentation

## 2013-10-27 NOTE — ED Notes (Signed)
Called ortho to place wrap/post op shoe.

## 2013-10-27 NOTE — ED Provider Notes (Signed)
CSN: [id number]     Arrival date & time 10/27/13  1338 History  This chart was scribed for non-physician practitioner Sharilyn Sites, PA-C working with Flint Melter, MD by Danella Maiers, ED Scribe. This patient was seen in room TR11C/TR11C and the patient's care was started at 2:05 PM.   Chief Complaint  Patient presents with  . Foot Pain   The history is provided by the patient. No language interpreter was used.   HPI Comments: [name] is a 67 y.o. female who presents to the Emergency Department complaining of right foot pain after hitting her foot on an object in her hallway at home.  No head trauma or LOC.  Pt states she has been ambulatory but it is somewhat painful.  Denies any numbness or paresthesias of foot or toes.  No meds taken PTA.  Past Medical History  Diagnosis Date  . Personal history of colonic polyps   . Iron deficiency anemia, unspecified   . Other malaise and fatigue   . Chest pain, unspecified   . Other B-complex deficiencies   . Arthritis     Right Sacroiliac joint  . Other and unspecified hyperlipidemia   . Anxiety and depression   . HTN (hypertension)   . Unspecified hypothyroidism   . Type II or unspecified type diabetes mellitus without mention of complication, not stated as uncontrolled   . Allergic rhinitis, cause unspecified   . Unspecified asthma(493.90)   . Chronic airway obstruction, not elsewhere classified   . Abnormal stress test 08/21/2008    Neg  . GERD (gastroesophageal reflux disease)    Past Surgical History  Procedure Laterality Date  . Foot surgery      hammer toe 2004, 2005  . Abdominal hysterectomy  1982    menometorrhagia  . Carpal tunnel release  03/05/2009    bilateral  . Colonoscopy N/A 09/06/2013    Procedure: COLONOSCOPY;  Surgeon: Louis Meckel, MD;  Location: [facility];  Service: Endoscopy;  Laterality: N/A;  . Hot hemostasis N/A 09/06/2013    Procedure: HOT HEMOSTASIS (ARGON PLASMA COAGULATION/BICAP);   Surgeon: Louis Meckel, MD;  Location: [facility];  Service: Endoscopy;  Laterality: N/A;   Family History  Problem Relation Age of Onset  . Hypertension Mother   . Diabetes Mother   . Heart disease Mother     CHF  . Coronary artery disease Mother   . Lung cancer Father   . Hypertension Father   . Diabetes Father   . Liver cancer Father     w/mets  . Prostate cancer Father   . Breast cancer Sister   . Lung disease Brother     Agent Orange Related  . Asthma Brother   . Allergies Brother   . Colon cancer Neg Hx    History  Substance Use Topics  . Smoking status: Former Smoker    Quit date: 06/01/1997  . Smokeless tobacco: Never Used     Comment: Quit 9 years ago.  . Alcohol Use: No   OB History   Grav Para Term Preterm Abortions TAB SAB Ect Mult Living                 Review of Systems  Musculoskeletal: Positive for arthralgias (right foot).  All other systems reviewed and are negative.    Allergies  Aspirin; Codeine; Demerol; Latex; and Lisinopril  Home Medications   Current Outpatient Rx  Name  Route  Sig  Dispense  Refill  .  ampicillin (PRINCIPEN) 250 MG capsule   Oral   Take 1 capsule (250 mg total) by mouth 4 (four) times daily.   30 capsule   5   . azelastine (ASTELIN) 137 MCG/SPRAY nasal spray      Place 1 spray into the nose at bedtime. Use in each nostril as directed.   30 mL   1     PATIENT DUE FOR A ROUTINE PHYSICAL EXAM   . calcium carbonate (OS-CAL) 600 MG TABS tablet   Oral   Take 600 mg by mouth daily with breakfast.         . clonazePAM (KLONOPIN) 1 MG tablet   Oral   Take 1 mg by mouth at bedtime as needed for anxiety.         . dicyclomine (BENTYL) 10 MG capsule   Oral   Take 1 capsule (10 mg total) by mouth 2 (two) times daily.   60 capsule   0     No further refills until pt seen in office . Pt ca ...   . doxycycline (VIBRAMYCIN) 100 MG capsule      Take 2 capsules today then 1 daily until gone   8 capsule    0   . ezetimibe (ZETIA) 10 MG tablet   Oral   Take 10 mg by mouth daily.         . fish oil-omega-3 fatty acids 1000 MG capsule   Oral   Take 1 g by mouth daily.         . furosemide (LASIX) 40 MG tablet   Oral   Take 40 mg by mouth daily.         Marland Kitchen glucose blood test strip      Use as instructed ACCUCHECK AVIVA TEST STRIPS , USE AS DIRECTED ONE TIME A DAY,  DIAGNOSIS CODE 250.0.   100 each   6   . HYDROcodone-acetaminophen (NORCO/VICODIN) 5-325 MG per tablet   Oral   Take 1 tablet by mouth every 8 (eight) hours as needed for pain. FILL ON OR AFTER 12/02/2013   90 tablet   0   . insulin glargine (LANTUS) 100 UNIT/ML injection   Subcutaneous   Inject 0.5 mLs (50 Units total) into the skin at bedtime.   40 mL   1   . levalbuterol (XOPENEX) 0.63 MG/3ML nebulizer solution   Nebulization   Take 1 ampule by nebulization every 6 (six) hours as needed. For wheezing         . levothyroxine (SYNTHROID) 88 MCG tablet   Oral   Take 1 tablet (88 mcg total) by mouth daily.   90 tablet   3   . memantine (NAMENDA) 10 MG tablet   Oral   Take 10 mg by mouth 2 (two) times daily.         . metFORMIN (GLUCOPHAGE) 1000 MG tablet   Oral   Take 1 tablet (1,000 mg total) by mouth 2 (two) times daily with a meal.   180 tablet   1   . Multiple Vitamins-Minerals (CENTRUM SILVER PO)   Oral   Take 1 tablet by mouth daily.         Marland Kitchen omega-3 acid ethyl esters (LOVAZA) 1 G capsule   Oral   Take 1 g by mouth 2 (two) times daily.         . ondansetron (ZOFRAN) 4 MG tablet   Oral   Take 1 tablet (4 mg total) by mouth  every 6 (six) hours.   12 tablet   0   . pioglitazone (ACTOS) 15 MG tablet   Oral   Take 1 tablet (15 mg total) by mouth daily.   30 tablet   5   . SPIRIVA HANDIHALER 18 MCG inhalation capsule      PLACE 1 CAPSULE INTO INHALER AND INHALE DAILY   90 capsule   1   . SYMBICORT 80-4.5 MCG/ACT inhaler      INHALE 2 PUFFS BY MOUTH TWICE DAILY   10.2  g   11   . valsartan (DIOVAN) 160 MG tablet   Oral   Take 160 mg by mouth daily.         . vitamin A 8000 UNIT capsule   Oral   Take 8,000 Units by mouth daily.          BP 142/44  Pulse 65  Temp(Src) 97.3 F (36.3 C) (Oral)  Resp 18  Ht 5\' 3"  (1.6 m)  Wt 187 lb (84.823 kg)  BMI 33.13 kg/m2  SpO2 100% Physical Exam  Nursing note and vitals reviewed. Constitutional: She is oriented to person, place, and time. She appears well-developed and well-nourished. No distress.  HENT:  Head: Normocephalic and atraumatic.  Eyes: EOM are normal.  Neck: Neck supple. No tracheal deviation present.  Cardiovascular: Normal rate.   Pulmonary/Chest: Effort normal. No respiratory distress.  Musculoskeletal: Normal range of motion.       Right foot: She exhibits tenderness, bony tenderness and swelling. She exhibits normal range of motion, normal capillary refill, no crepitus, no deformity and no laceration.       Feet:  Small amount of swelling and bruising noted to base of 5th metatarsal; full ROM of ankle and all toes; strong distal pulse and cap refill; sensation intact  Neurological: She is alert and oriented to person, place, and time.  Skin: Skin is warm and dry.  Psychiatric: She has a normal mood and affect. Her behavior is normal.    ED Course  Procedures (including critical care time) Medications - No data to display  DIAGNOSTIC STUDIES: Oxygen Saturation is 100% on RA, normal by my interpretation.    COORDINATION OF CARE: 2:29 PM- Discussed treatment plan with pt. Pt agrees to plan.  Labs Review Labs Reviewed - No data to display Imaging Review Dg Foot Complete Right  10/27/2013   CLINICAL DATA:  Lateral right-sided foot pain status post trauma  EXAM: RIGHT FOOT COMPLETE - 3+ VIEW  COMPARISON:  Limited views of the lateral aspect of the foot dated August 27, 2010.  FINDINGS: The bones of the foot are diffusely osteopenic. There is an acute transversely oriented  fracture through the base of the 5th metatarsal. The fracture is mildly distracted. The shaft of the 5th metatarsal appears intact otherwise. The 1st through 4th metatarsals are intact. The patient has undergone previous surgery with placement of a compression screw at the base of the 1st metatarsal. There are degenerative changes of the metatarsophalangeal joints. The bones of the hindfoot appear intact. There is a plantar calcaneal spur. There are a few vascular calcifications present  IMPRESSION: The patient has sustained an acute fracture through the base of the 5th metatarsal.   Electronically Signed   By: David  [place]   On: 10/27/2013 14:22    EKG Interpretation   None       MDM   1. Fracture of base of fifth metatarsal bone, right, closed, initial encounter  Fracture at base of 5th metatarsal without displacement.  Pts foot to be wrapped and placed in post-op shoe.  FU with orthopedics.  Continue taking home pain meds as directed.  Discussed plan with pt, she agreed.  Return precautions advised.  I personally performed the services described in this documentation, which was scribed in my presence. The recorded information has been reviewed and is accurate.  Garlon Hatchet, PA-C 10/27/13 1505

## 2013-10-27 NOTE — ED Provider Notes (Signed)
  Face-to-face evaluation   History: Patient up her foot on a headache, while walking, and this caused her to fall. She injured her right foot in the fall. There was no injury to head, neck, or back.  Physical exam: Tenderness, swelling, and ecchymosis of the right fifth tarsal. No associated deformity.  Medical screening examination/treatment/procedure(s) were conducted as a shared visit with non-physician practitioner(s) and myself.  I personally evaluated the patient during the encounter  Flint Melter, MD 11/02/13 [phone]

## 2013-10-27 NOTE — ED Provider Notes (Deleted)
Medical screening examination/treatment/procedure(s) were performed by non-physician practitioner and as supervising physician I was immediately available for consultation/collaboration.  Kees Idrovo L Asencion Loveday, MD 10/27/13 1700 

## 2013-10-27 NOTE — ED Notes (Signed)
Pt c/o right foot pain after trip and fall today

## 2013-10-27 NOTE — Progress Notes (Signed)
Orthopedic Tech Progress Note Patient Details:  [name] [birth year] [id number] Ace wrap applied to Right LE with cotton wrap protection. Post op shoe applied. Care instructions provided.  Ortho Devices Type of Ortho Device: Ace wrap;Postop shoe/boot Ortho Device/Splint Location: Right LE Ortho Device/Splint Interventions: Application   Asia R Thompson 10/27/2013, 2:54 PM

## 2013-10-31 ENCOUNTER — Other Ambulatory Visit: Payer: Self-pay | Admitting: Internal Medicine

## 2013-11-02 ENCOUNTER — Ambulatory Visit (INDEPENDENT_AMBULATORY_CARE_PROVIDER_SITE_OTHER): Payer: Medicare Other

## 2013-11-02 ENCOUNTER — Other Ambulatory Visit (INDEPENDENT_AMBULATORY_CARE_PROVIDER_SITE_OTHER): Payer: Medicare Other

## 2013-11-02 DIAGNOSIS — E119 Type 2 diabetes mellitus without complications: Secondary | ICD-10-CM

## 2013-11-02 DIAGNOSIS — E538 Deficiency of other specified B group vitamins: Secondary | ICD-10-CM

## 2013-11-02 DIAGNOSIS — E039 Hypothyroidism, unspecified: Secondary | ICD-10-CM

## 2013-11-02 DIAGNOSIS — E785 Hyperlipidemia, unspecified: Secondary | ICD-10-CM

## 2013-11-02 DIAGNOSIS — D509 Iron deficiency anemia, unspecified: Secondary | ICD-10-CM

## 2013-11-02 DIAGNOSIS — J309 Allergic rhinitis, unspecified: Secondary | ICD-10-CM

## 2013-11-02 LAB — COMPREHENSIVE METABOLIC PANEL
ALT: 17 U/L (ref 0–35)
AST: 19 U/L (ref 0–37)
Albumin: 3.7 g/dL (ref 3.5–5.2)
BUN: 28 mg/dL — ABNORMAL HIGH (ref 6–23)
Calcium: 9.7 mg/dL (ref 8.4–10.5)
Chloride: 103 mEq/L (ref 96–112)
Creatinine, Ser: 1.1 mg/dL (ref 0.4–1.2)
GFR: 51.15 mL/min — ABNORMAL LOW (ref >60.00)
Glucose, Bld: 188 mg/dL — ABNORMAL HIGH (ref 70–99)
Potassium: 4.1 mEq/L (ref 3.5–5.1)

## 2013-11-02 LAB — HEPATIC FUNCTION PANEL
ALT: 17 U/L (ref 0–35)
AST: 19 U/L (ref 0–37)
Albumin: 3.7 g/dL (ref 3.5–5.2)
Alkaline Phosphatase: 55 U/L (ref 39–117)

## 2013-11-02 LAB — LIPID PANEL
Cholesterol: 198 mg/dL (ref 0–200)
HDL: 41.6 mg/dL (ref >39.00)
Triglycerides: 239 mg/dL — ABNORMAL HIGH (ref 0.0–149.0)

## 2013-11-02 LAB — HEMOGLOBIN A1C: Hgb A1c MFr Bld: 6.3 % (ref 4.6–6.5)

## 2013-11-02 LAB — TSH: TSH: 3.5 u[IU]/mL (ref 0.35–5.50)

## 2013-11-02 LAB — VITAMIN B12: Vitamin B-12: 245 pg/mL (ref 211–911)

## 2013-11-02 LAB — LDL CHOLESTEROL, DIRECT: Direct LDL: 130.4 mg/dL

## 2013-11-02 LAB — HEMOGLOBIN AND HEMATOCRIT, BLOOD: HCT: 37 % (ref 36.0–46.0)

## 2013-11-04 ENCOUNTER — Other Ambulatory Visit: Payer: Self-pay | Admitting: Internal Medicine

## 2013-11-04 ENCOUNTER — Encounter: Payer: Self-pay | Admitting: Internal Medicine

## 2013-11-04 DIAGNOSIS — E785 Hyperlipidemia, unspecified: Secondary | ICD-10-CM

## 2013-11-04 DIAGNOSIS — E119 Type 2 diabetes mellitus without complications: Secondary | ICD-10-CM

## 2013-11-10 ENCOUNTER — Telehealth: Payer: Self-pay | Admitting: Internal Medicine

## 2013-11-10 ENCOUNTER — Other Ambulatory Visit: Payer: Self-pay | Admitting: Gastroenterology

## 2013-11-10 DIAGNOSIS — E785 Hyperlipidemia, unspecified: Secondary | ICD-10-CM

## 2013-11-10 NOTE — Telephone Encounter (Signed)
Pt request referral for [facility] due to result of lab work. Please advise, no referral in Epic.

## 2013-11-10 NOTE — Telephone Encounter (Signed)
Asking about referral to lipid clinic. Re did order.

## 2013-11-13 NOTE — Telephone Encounter (Signed)
Ok to renew for 1 week of rx. Needs OV

## 2013-11-15 ENCOUNTER — Telehealth: Payer: Self-pay | Admitting: Neurology

## 2013-11-15 ENCOUNTER — Telehealth: Payer: Self-pay

## 2013-11-15 DIAGNOSIS — F0280 Dementia in other diseases classified elsewhere without behavioral disturbance: Secondary | ICD-10-CM

## 2013-11-15 NOTE — Telephone Encounter (Signed)
Phone call from Martie Lee (not sure relation) [phone] she would like patient to have an appointment to be tested for dementia again. She states she is getting worse.

## 2013-11-15 NOTE — Telephone Encounter (Signed)
Will refer to [facility] neurology for evaluation

## 2013-11-15 NOTE — Telephone Encounter (Signed)
Patient scheduled for sooner appt, per daughter in law's([name]) request

## 2013-11-16 ENCOUNTER — Ambulatory Visit (INDEPENDENT_AMBULATORY_CARE_PROVIDER_SITE_OTHER): Payer: Medicare Other

## 2013-11-16 ENCOUNTER — Other Ambulatory Visit: Payer: Self-pay

## 2013-11-16 ENCOUNTER — Telehealth: Payer: Self-pay | Admitting: Internal Medicine

## 2013-11-16 DIAGNOSIS — J309 Allergic rhinitis, unspecified: Secondary | ICD-10-CM

## 2013-11-16 MED ORDER — OSELTAMIVIR PHOSPHATE 75 MG PO CAPS: 75.0000 mg | ORAL_CAPSULE | Freq: Two times a day (BID) | ORAL | Status: DC

## 2013-11-16 NOTE — Telephone Encounter (Signed)
Spoke with daughter. She reports pt had dry cough, PND, nasal congestion, chest congestion, chills, HA x 3-4 days. Denies any fever/n/v. Pt is not taking anything OTC. Please advise Dr. Maple Hudson thanks  Allergies  Allergen Reactions  . Aspirin Itching  . Codeine Itching  . Demerol Itching    Unknown-unconscious.  . Latex Itching  . Lisinopril Itching and Cough    REACTION: causes cough

## 2013-11-16 NOTE — Telephone Encounter (Signed)
Called in tamiflu X10 1pobid per CY.  Spoke with pt, she is aware.  Nothing further needed. Aamir Mclinden L, CMA

## 2013-11-16 NOTE — Telephone Encounter (Signed)
Pt called back would like med to be in capsule-form instead of pill.[name]

## 2013-11-17 ENCOUNTER — Ambulatory Visit (INDEPENDENT_AMBULATORY_CARE_PROVIDER_SITE_OTHER): Payer: Medicare Other | Admitting: Neurology

## 2013-11-17 ENCOUNTER — Encounter: Payer: Self-pay | Admitting: Neurology

## 2013-11-17 ENCOUNTER — Encounter (INDEPENDENT_AMBULATORY_CARE_PROVIDER_SITE_OTHER): Payer: Self-pay

## 2013-11-17 ENCOUNTER — Other Ambulatory Visit: Payer: Self-pay | Admitting: Internal Medicine

## 2013-11-17 VITALS — BP 146/59 | HR 64 | Ht 62.0 in | Wt 198.0 lb

## 2013-11-17 DIAGNOSIS — E119 Type 2 diabetes mellitus without complications: Secondary | ICD-10-CM

## 2013-11-17 DIAGNOSIS — E538 Deficiency of other specified B group vitamins: Secondary | ICD-10-CM

## 2013-11-17 DIAGNOSIS — E785 Hyperlipidemia, unspecified: Secondary | ICD-10-CM

## 2013-11-17 DIAGNOSIS — D509 Iron deficiency anemia, unspecified: Secondary | ICD-10-CM

## 2013-11-17 DIAGNOSIS — I10 Essential (primary) hypertension: Secondary | ICD-10-CM

## 2013-11-17 DIAGNOSIS — E039 Hypothyroidism, unspecified: Secondary | ICD-10-CM

## 2013-11-17 DIAGNOSIS — F341 Dysthymic disorder: Secondary | ICD-10-CM

## 2013-11-17 DIAGNOSIS — R413 Other amnesia: Secondary | ICD-10-CM

## 2013-11-17 MED ORDER — DONEPEZIL HCL 10 MG PO TABS: 10.0000 mg | ORAL_TABLET | Freq: Every day | ORAL | Status: DC

## 2013-11-17 MED ORDER — MEMANTINE HCL ER 28 MG PO CP24: 28.0000 mg | ORAL_CAPSULE | Freq: Every day | ORAL | Status: DC

## 2013-11-17 NOTE — Progress Notes (Signed)
History of Present Illness  [name] is a pleasant 67 year old right-handed Caucasian female, accompanied by her family at visit , she is referred by primary care Dr. Filbert [name] for evaluation of memory loss, last clinical visit was August 2013   She has past medical history of hypertension, diabetes,hypothyroidism, depression anxiety, COPD  She had 10 year education, worked as Lawyer for 25 years.  has 7 children, went on disability in 2004 due to gait difficulty,   She had gradual onset memory loss since 2012, she has labile glucose up to 400s, forget to take her insulin sometimes,  increased difficulty over past few months, anxious, not herself, increased falling.  She also has 20 years history of unsteady gait, gradual onset, tends to veer to the right side. she has long-standing history of diabetes, but she denied bilateral lower extremity paresthesia, she previously had valgus toes bilaterally, requiring surgery.   MRI brain in 2012 showed  Mild scattered periventricular and subcortical T2 hyperintensities consistent with chronic microvascular ischemic disease, enlarged, partially empty sella.  She ws admitted by Dr [name] from July 24 -26, for worsening delusion, agitation, was evaluated by psychologists was diagnosed with dementia with delusion,  Zyprexa was added on, now she is on 5 mg every night, still has delusions, see worms in her drinks, sometimes poured out drinks  but she has fair appetite, sleeping well, still help with house chore, she lives with her son, and daughter-in-law in her house, her daughter-in-law stays at home with her.  Repeat MRI of brainin 2013  showed mild atrophy, small vessel disease, no acute lesions, laboratory showed A1c 8.2, elevated TSH, 6.97,  UPDATE 11/17/2013:  She has trouble remembering, forget grandchildren's name, could not name object.  She could not remember conversation.  She is eating well, she fell recently with right foot fracture. She sleeps well.   She still helps house chore, sweeping floor,doing  laundary.  She does not cook any more.Overall she is stable, no longer has visual hallucination, less agitated, She is taking Namenda 10 mg twice a day   Review of Systems  Out of a complete 14 system review, the patient complains of only the following symptoms, and all other reviewed systems are negative.  Memory loss  Social History Patient is retired. Patient worked for her self. Patient lives with son and daughter in law. Patient does drinks   caffeine about 2 cups daily patient denies any history of tobacco,alcohol and drugs. Right handed. Any Tobacco Use: Never used Inhaled Tobacco Use:  quit smoking   Family History Patients parents are both deceased cause of fathers death lung caner --69 causeof mothers death heart problems--83  Past Medical History High blood pressure Thyroid problems Depression/anxiety Diabetes Migraine headaches COPD  Surgical History Hysterectomy Toes  Physical Exam  Neck: supple no carotid bruits Respiratory: clear to auscultation bilaterally Cardiovascular: regular rate rhythm  Neurologic Exam  Mental Status: obese, awake, alert, cooperative to history, talking, and casual conversation.mmse 30/30 Cranial Nerves: CN II-XII pupils were equal round reactive to light.  Fundi were sharp bilaterally.  Extraocular movements were full.  Visual fields were full on confrontational test.  Facial sensation and strength were normal.  Hearing was intact to finger rubbing bilaterally.  Uvula tongue were midline.  Head turning and shoulder shrugging were normal and symmetric.  Tongue protrusion into the cheeks strength were normal.  Motor: Normal tone, bulk, and strength. bilateral ankle pitting edema Sensory: length dependent decreased light touch, pinprick, proprioception, and vibratory sensation. Coordination:  Normal finger-to-nose, heel-to-shin.  There was no dysmetria noticed. Gait and Station:  valgus knee,  cautious, moderate stride, good arm swing, tend to veer to right, right foot in boot.  Romberg sign: Negative Reflexes: Deep tendon reflexes: Biceps: 2/2, Brachioradialis: 2/2, Triceps: 2/2, Pateller: 2/2, Achilles: absent.  Plantar responses are flexor.  Assessment and Plan 67 year-old female presenting with memory loss, mild cognitive impairment with delusion previously, overall stable MMSE 30/30 today  1.  mild cognitive impairment . 2.  add on donepezil titrating to 10mg  qam. 3.  keep Namenda xr 28 mg every day 4.  return to clinic in 6 months with [name].

## 2013-11-19 ENCOUNTER — Other Ambulatory Visit: Payer: Self-pay | Admitting: Internal Medicine

## 2013-11-20 ENCOUNTER — Telehealth: Payer: Self-pay | Admitting: Internal Medicine

## 2013-11-20 ENCOUNTER — Ambulatory Visit (INDEPENDENT_AMBULATORY_CARE_PROVIDER_SITE_OTHER): Payer: Medicare Other | Admitting: Pharmacist

## 2013-11-20 DIAGNOSIS — Z79899 Other long term (current) drug therapy: Secondary | ICD-10-CM

## 2013-11-20 DIAGNOSIS — E785 Hyperlipidemia, unspecified: Secondary | ICD-10-CM

## 2013-11-20 MED ORDER — PRAVASTATIN SODIUM 20 MG PO TABS: 20.0000 mg | ORAL_TABLET | Freq: Every evening | ORAL | Status: DC

## 2013-11-20 NOTE — Telephone Encounter (Signed)
Martie Lee has been notified of MD message.

## 2013-11-20 NOTE — Telephone Encounter (Signed)
Stop actos Reduce Lantus to 40 units qHS

## 2013-11-20 NOTE — Assessment & Plan Note (Addendum)
Given patient's h/o diabetes and LDL of 130 mg/dL, would like to see LDL at least < 100 mg/dL, and preferably < 70 mg/dL.  Patient had LDL of 68 mg/dL in past on simvastatin + Zetia.  Given she failed simvastatin and atorvastatin due to urticaria, will try a different statin today.  Want to make sure we don't use something that may worsen dementia as well.  We discussed dietary changes that are needed, especially for her to stop skipping lunch, and to make sure she is getting some sort of carb at every meal.  Will start her on low dose pravastatin today (20 mg daily), and also increase fish oil to 2 g/d.  Fish oil will hopefully lower non-HDL and TG further, and also may help with memory.  Will recheck cholesterol / liver in 3 months, then see me 1 day later. Plan: 1.  Increase fish oil to 2,000 mg daily. 2.  Start pravastatin 20 mg once daily in the evening.   3.  Continue Zetia 10 mg qd. 4.  Don't skip meals. 5.  Avoid lots of red meat and fried foods. 6.  Recheck cholesterol and liver in 3 months (3/9) and see Riki Rusk the next day (02/06/14 @ 3:00)

## 2013-11-20 NOTE — Patient Instructions (Signed)
1.  Increase fish oil to 2,000 mg daily. 2.  Start pravastatin 20 mg once daily in the evening.   3.  Don't skip meals. 4.  Avoid lots of red meat and fried foods. 5.  Recheck cholesterol and liver in 3 months (3/9) and see Riki Rusk the next day (02/06/14 @ 3:00)

## 2013-11-20 NOTE — Telephone Encounter (Signed)
Patient Information:  Caller Name: Martie Lee  Phone: [phone]  Patient: [name], [name]  Gender: Female  DOB: 11/26/1946  Age: 67 Years  PCP: Illene Regulus (Adults only)  Office Follow Up:  Does the office need to follow up with this patient?: Yes  Instructions For The Office: Please call concerning low BS readings  RN Note:  Care advice given and note for office FU sent concerning low BS and medication regimen.  Symptoms  Reason For Call & Symptoms: Today, 11/20/2013, [name]/ Care giver calling about low blood glucose that has been noticed  intermittently  in the early mornings for the last 4-5 months since changing  from Januvia to Actos.   On 11/18/2013 Caregiver noticed pt fell and   concerned her BS was low . She gave pt orange juice,peanut butter sandwich  and FBS 35 .  On   11/19/2013  FBS 42 and this morning  87.  She  Has had  breakfast, and ate sandwich at 1200  and BS 100  during call .   Caregiver was told by family that pt had ~ 5 vomiting episodes on 11/19/2013, but has tolerated fluids and meals today and they also only gave her Lantus 30 units . Pt takes Glucophage 1000 MG BID, Actos 15 mg in the AM , and Lantus 50 units at HS.  Evening BS have been 100-200.  Reviewed Health History In EMR: Yes  Reviewed Medications In EMR: Yes  Reviewed Allergies In EMR: Yes  Reviewed Surgeries / Procedures: Yes  Date of Onset of Symptoms: 10/30/2013  Treatments Tried: Orange Juice, peanut butter  Treatments Tried Worked: Yes  Guideline(s) Used:  Diabetes - Low Blood Sugar  Disposition Per Guideline:   Discuss with PCP and Callback by Nurse Today  Reason For Disposition Reached:   Morning (before breakfast) blood glucose < 80 mg/dl (4.5 mmol/L) and more than once in past week  Advice Given:  Treatment  Precose (acarbose):  ln this case low blood sugar must be treated with commercial glucose tablets. Take 3 glucose tablets now. If glucose tablets are not available, milk may  work.  Special note: juice, candies and table sugar are not effective because of the mechanism of action of this diabetic pill.  Expected Course:  The symptoms of hypoglycemia should resolve in 10-15 minutes. After the symptoms resolve, eat a small snack to prevent this from recurring. Examples include: cheese and crackers, a glass of milk, or half a sandwich.  If the symptoms of hypoglycemia are not better in 15 minutes, eat some more glucose (10 gm).  Call Back If:  There is no improvement within 30 minutes  Sleepiness or confusion occur  You become worse.  Reassurance  Low blood sugar can result from taking too much diabetes medication, delayed meals, strenuous exercise, or a combination of these factors.  Patient Will Follow Care Advice:  YES

## 2013-11-20 NOTE — Progress Notes (Signed)
Patient referred to lipid clinic by Dr. Debby Bud due to elevated LDL and non-HDL in patient with h/o diabetes and has failed lipid lowering therapies in the past.  Patient currently taking Zetia 10 mg qd and over the counter fish oil periodically.  She tried simvastatin in the past, and she thinks she tried lipitor in the past, and both stopped due to urticaria.  She was also tried on Welchol, and this was also stopped but patient doesn't recall why.  I can't tell from the notes why Welchol was stopped either.  When I mentioned constipation, patient states that this was likely the reason Welchol was stopped.  Patient does have some mild underlying dementia.  Risk Factors:  Diabetes, HTN, family h/o CAD, age, low HDL - LDL goal < 70, non-HDL goal < 100 Meds:  Zetia 10 mg qd, fish oil 1 g some days of the week Intolerant:  Simvastatin and atorvastatin (urticaria per patient), Welchol taken in past (not sure why stopped).  Patient is working with Dr. Debby Bud to get glucose under better control.  Patient c/o low blood sugar readings lately, quite often in the early morning.  She does admit to skipping lunch most days of the week, and given she is on insulin as well, this could be contributing to her hypoglycemia.  Her Actos is being stopped, and lantus dose reduced this week by Dr. Debby Bud.    Social history:  Former smoker (quit over 10 years ago).  Doesn't drink alcohol. Family history:  CAD - mother.  Diabetes - mother and father. Exercise:  No regular exercise per patient.  She fractured her right foot recently and this is limiting any regular exercise.  Diet:  Breakfast - typically oatmeal, toast, and coffee.  Lunch - typically skips per patient despite Dr. Debby Bud and daughter stressing the importance of eating 3-4 small meals per day.  Dinner - typically eats at home and mostly eats chicken/fish and a vegetable.  Patient does eat lots of breads/rolls.  She rarely eats out.  She did ask about egg  consumption today.    Labs:  10/2013 - TC 198, TG 239, HDL 42, LDL 130 (Zetia 10 mg qd, fish oil 1 g/d) A1C - 6.3, TSH - WNL  Current Outpatient Prescriptions  Medication Sig Dispense Refill  . azelastine (ASTELIN) 137 MCG/SPRAY nasal spray Place 1 spray into the nose at bedtime. Use in each nostril as directed.  30 mL  1  . budesonide-formoterol (SYMBICORT) 80-4.5 MCG/ACT inhaler Inhale 2 puffs into the lungs 2 (two) times daily.      . calcium carbonate (OS-CAL) 600 MG TABS tablet Take 600 mg by mouth daily with breakfast.      . clonazePAM (KLONOPIN) 1 MG tablet Take 1 mg by mouth at bedtime as needed for anxiety.      . donepezil (ARICEPT) 10 MG tablet Take 1 tablet (10 mg total) by mouth at bedtime.  30 tablet  12  . ezetimibe (ZETIA) 10 MG tablet Take 10 mg by mouth daily.      . fish oil-omega-3 fatty acids 1000 MG capsule Take 1 g by mouth daily.      . furosemide (LASIX) 40 MG tablet Take 40 mg by mouth daily.      Marland Kitchen glucose blood test strip Use as instructed ACCUCHECK AVIVA TEST STRIPS , USE AS DIRECTED ONE TIME A DAY,  DIAGNOSIS CODE 250.0.  100 each  6  . HYDROcodone-acetaminophen (NORCO/VICODIN) 5-325 MG per tablet Take 1  tablet by mouth every 8 (eight) hours as needed for pain. FILL ON OR AFTER 12/02/2013  90 tablet  0  . LANTUS 100 UNIT/ML injection INJECT 50 UNITS INTO SKIN AT BEDTIME  40 mL  5  . levalbuterol (XOPENEX) 0.63 MG/3ML nebulizer solution Take 1 ampule by nebulization every 6 (six) hours as needed. For wheezing      . levothyroxine (SYNTHROID) 88 MCG tablet Take 1 tablet (88 mcg total) by mouth daily.  90 tablet  3  . Memantine HCl ER 28 MG CP24 Take 28 mg by mouth daily.  90 capsule  3  . metFORMIN (GLUCOPHAGE) 1000 MG tablet TAKE 1 TABLET BY MOUTH TWICE DAILY WITH A MEAL  180 tablet  3  . Multiple Vitamins-Minerals (CENTRUM SILVER PO) Take 1 tablet by mouth daily.      Marland Kitchen oseltamivir (TAMIFLU) 75 MG capsule Take 1 capsule (75 mg total) by mouth 2 (two) times daily.   10 capsule  0  . permethrin (ELIMITE) 5 % cream APPLY TOPICALLY ONCE AS DIRECTED  60 g  1  . pioglitazone (ACTOS) 15 MG tablet Take 1 tablet (15 mg total) by mouth daily.  30 tablet  5  . PREMARIN vaginal cream APPLY VAGINALLY DAILY FOR 10 DAYS, THEN TWICE A WEEK  30 g  0  . tiotropium (SPIRIVA) 18 MCG inhalation capsule Place 18 mcg into inhaler and inhale daily.      . valsartan (DIOVAN) 160 MG tablet Take 160 mg by mouth daily.      . vitamin A 8000 UNIT capsule Take 8,000 Units by mouth daily.      Marland Kitchen ZETIA 10 MG tablet TAKE 1 TABLET BY MOUTH DAILY  90 tablet  3  . [DISCONTINUED] Calcium Carbonate-Vitamin D (CALCIUM 600/VITAMIN D) 600-400 MG-UNIT per tablet Take 1 tablet by mouth 2 (two) times daily.        . [DISCONTINUED] sodium chloride (OCEAN) 0.65 % nasal spray Place 2 sprays into the nose as needed. TO OPEN NASAL BREATHING       No current facility-administered medications for this visit.   Allergies  Allergen Reactions  . Aspirin Itching  . Codeine Itching  . Demerol Itching    Unknown-unconscious.  . Latex Itching  . Lisinopril Itching and Cough    REACTION: causes cough

## 2013-11-21 ENCOUNTER — Telehealth: Payer: Self-pay | Admitting: Internal Medicine

## 2013-11-21 MED ORDER — DOXYCYCLINE HYCLATE 100 MG PO CAPS: ORAL_CAPSULE | ORAL | Status: DC

## 2013-11-21 NOTE — Telephone Encounter (Signed)
Per CY-okay to give Doxycycline 100 mg #8 take 2 today then 1 daily until gone no refills.  

## 2013-11-21 NOTE — Telephone Encounter (Signed)
I spoke with pt. She finished tamiflu giving to her on her 11/16/13. She is still having dry cough, chest congestion, sinus HA, slight wheezing and chest tx. She is taking mucinex 2 po BID. Pt requesting doxy capsule. Please advise Dr. Maple Hudson thanks Last OV 07/20/13 with [facility] acute visit Pending 11/28/13 Allergies  Allergen Reactions  . Aspirin Itching  . Codeine Itching  . Demerol Itching    Unknown-unconscious.  . Latex Itching  . Lisinopril Itching and Cough    REACTION: causes cough

## 2013-11-21 NOTE — Telephone Encounter (Signed)
lmomtcb x1 for pt rx sent 

## 2013-11-22 NOTE — Telephone Encounter (Signed)
Called, spoke with pt. She picked doxy up last night.  She is aware to call back if symptoms do not improve or worsen.

## 2013-11-27 ENCOUNTER — Other Ambulatory Visit: Payer: Self-pay | Admitting: Internal Medicine

## 2013-11-27 ENCOUNTER — Ambulatory Visit: Payer: Medicare Other | Admitting: Internal Medicine

## 2013-11-27 NOTE — Telephone Encounter (Signed)
Clonazepam called to pharmacy  

## 2013-11-28 ENCOUNTER — Ambulatory Visit (INDEPENDENT_AMBULATORY_CARE_PROVIDER_SITE_OTHER): Payer: Medicare Other | Admitting: Internal Medicine

## 2013-11-28 ENCOUNTER — Encounter: Payer: Self-pay | Admitting: Internal Medicine

## 2013-11-28 ENCOUNTER — Ambulatory Visit (INDEPENDENT_AMBULATORY_CARE_PROVIDER_SITE_OTHER): Payer: Medicare Other

## 2013-11-28 VITALS — BP 118/60 | HR 67 | Ht 61.0 in | Wt 190.4 lb

## 2013-11-28 DIAGNOSIS — J309 Allergic rhinitis, unspecified: Secondary | ICD-10-CM

## 2013-11-28 DIAGNOSIS — J301 Allergic rhinitis due to pollen: Secondary | ICD-10-CM

## 2013-11-28 MED ORDER — AZELASTINE HCL 0.1 % NA SOLN: NASAL | Status: DC

## 2013-11-28 NOTE — Progress Notes (Signed)
Subjective:   Patient ID: [name], female DOB: 26-Dec-1945, 67 y.o. MRN: [id number]  HPI  05/19/11- 69 yoF former heavy smoker followed for asthma/ COPD, hx food intolerance, allergic rhinitis, complicated by obesity, DM  Last here November 20, 2010- note reviewed  Allergy vaccine 1:10 GH  Says she has been doing well. Spring pollen bothered nose and chest in March. Had some infection then, helped by doxycycline.  Reports no change in dry hacking cough, not relieved by cough syrup. Occasional, infrequent heart burn.  CXR 02/08/11- [facility]- lungs clear, ASVD  We discussed updating PFT. She had smoked up to 3 PPD  07/13/11- 65 yoF former heavy smoker followed for asthma/ COPD, hx food intolerance, allergic rhinitis, complicated by obesity, DM  Did not schedule PFT after last visit as intended, but has done so now.  She feels well. Remains off cigarettes. Coughs only after using her inhaler- only a dry cough. No longer experiences shortness of breath.  Allergy vaccine- she feels they help her a lot. Went to ENT for ear pain- blamed throat congestion on reflux and Rx'd bid Nexium- "helps a lot".  09/18/11- 65 yoF former heavy smoker followed for asthma/ COPD, hx food intolerance, allergic rhinitis, complicated by obesity, DM  Has had flu vaccine. She had done fairly well since here in August. Last night she had onset of dry cough and head congestion without fever or sore throat. Intermittent pain in the right side of the neck at the angle of the jaw comes and goes. It is not associated with popping, vertigo, pain on chewing or altered hearing.  History of smoking 3 packs per day.  PFT 08/26/2011-FEV1 1.45/76%, FEV1/FVC 0.7 no, FEF 25-75% was 35% of predicted with slight response to bronchodilator. TLC 100% RV 131% DLCO 54%. Moderate obstruction with slight response to bronchodilator and air trapping. This is probably mostly some emphysema. CXR 01/29/2011-atherosclerosis with clear lung fields.   02/12/12- 65 yoF former heavy smoker followed for asthma/ COPD, hx food intolerance, allergic rhinitis, complicated by obesity, DM  Continues allergy vaccine without problems. She is sure it helps. She used her last doxycycline about 2 weeks ago for chest congestion. We discussed this.  With some vertigo and pressure in right ear. Often has pain in right side of neck. Occasional minor cough. She is on lisinopril and we discussed this carefully. Using rescue inhaler twice a week. Last PFT was 08/26/2011. Charted.   05/27/12- 65 yoF former heavy smoker followed for asthma/ COPD, hx food intolerance, allergic rhinitis, complicated by obesity, DM PCP Dr Debby Bud  Still on Allergy Vaccine 1:10 here; states rash on body from thighs to neck-just itchy all the time; Denies any wheezing,cough,SOB, or congestion.  Has had pruritic rash on trunk and arms for 3 weeks, cause unknown. Cortisone cream has not seemed to help. She had taken cephalexin for UTI last week the rash began before that. Breathing has been good with no recent exacerbation and insignificant cough and wheeze.   11/28/12- 65 yoF former heavy smoker followed for asthma/ COPD, hx food intolerance, allergic rhinitis, complicated by obesity, DM PCP Dr Debby Bud  FOLLOWS FOR: still on vaccine. having lots of congestion and wheezing.  For past 3 weeks acute illness. Sore throat moved to chest congestion with much wheeze, cough productive of clear mucus. Denies nasal congestion. She tried Augmentin which did not help. Did have flu vaccine. Family members living in the same house are very sick with similar illness.  She has continued  allergy vaccine 1:10 GH without problems  CXR 08/11/12  IMPRESSION:  No acute cardiopulmonary disease.  Original Report Authenticated By: Consuello Bossier, M.D.  05/29/13- 59 yoF former heavy smoker followed for asthma/ COPD, hx food intolerance, allergic rhinitis, complicated by obesity, DM PCP Dr Debby Bud  FOLLOWS FOR: still on  allergy vaccine 1:10 GH and doing well.  No longer feels much problem with her breathing even on walks.  Substernal pain just as she lay down last night, fell asleep despite it. Notes knot on anterior chest.  CXR 03/01/13  IMPRESSION:  No acute abnormality.  Original Report Authenticated By: Janeece Riggers, M.D.  HPI 07/20/13 Dr Shelle Iron Patient comes in today for an acute sick visit.  The patient is usually followed by Dr. Maple Hudson for asthma and COPD.  She gives a 3 to four-day history of low grade fever, severe throat discomfort, as well as general malaise.  She denies any shortness of breath, cough with purulence, or chest congestion.  She has not had any sinus congestion or purulence noted, and denies severe pain.  She has had some neck soreness that she thinks is from lymphadenopathy.  She denies any issues with voice change, no difficulty swallowing solids or liquids, and no drooling.  11/28/13- 89yoF former heavy smoker followed for asthma/ COPD, hx food intolerance, allergic rhinitis, complicated by obesity,  Dementia, DM   PCP Dr Debby Bud FOLLOWS FOR: still on Allergy vaccine 1:10 GH and doing well; will have slight runny nose with sinus headaches at times. Pt needs Rx printed for Astelin(Name brand ONLY). Continues Spiriva and Symbicort. She says allergy vaccine helps she can tell the difference if she has no transportation and has to miss her shot. She insists that brand-name Astelin nasal spray works much better than generic.  ROS-see HPI Constitutional:   No-   weight loss, night sweats, fevers, chills, fatigue, lassitude. HEENT:   No-  headaches, difficulty swallowing, tooth/dental problems, sore throat,       No-  sneezing, itching, ear ache, nasal congestion, post nasal drip,  CV:  No-   chest pain, orthopnea, PND, swelling in lower extremities, anasarca, dizziness, palpitations Resp: No-   shortness of breath with exertion or at rest.              No-   productive cough,  No  non-productive cough,  No- coughing up of blood.              No-   change in color of mucus.  No- wheezing.   Skin: No-   rash or lesions. GI:  No-   heartburn, indigestion, abdominal pain, nausea, vomiting, GU:  MS:  No-   joint pain or swelling.  Neuro-     nothing unusual Psych:  No- change in mood or affect. No depression or anxiety.  No memory loss.     Objective:  OBJ- Physical Exam General- Alert, Oriented, Affect-appropriate, Distress- none acute Skin- rash-none, lesions- none, excoriation- none Lymphadenopathy- none Head- atraumatic            Eyes- Gross vision intact, PERRLA, conjunctivae and secretions clear            Ears- Hearing, canals-normal            Nose- Clear, no-Septal dev, mucus, polyps, erosion, perforation             Throat- Mallampati II , mucosa clear , drainage- none, tonsils- atrophic Neck- flexible , trachea midline, no stridor , thyroid  nl, carotid no bruit Chest - symmetrical excursion , unlabored           Heart/CV- RRR , no murmur , no gallop  , no rub, nl s1 s2                           - JVD- none , edema- none, stasis changes- none, varices- none           Lung- clear to P&A, wheeze- none, cough- none , dullness-none, rub- none           Chest wall-  Abd-  Br/ Gen/ Rectal- Not done, not indicated Extrem- cyanosis- none, clubbing, none, atrophy- none, strength- nl Neuro- + tremor    Assessment & Plan:

## 2013-11-28 NOTE — Patient Instructions (Signed)
Script sent for brand-name Astelin nasal spray as requested.  We can continue allergy vaccine 1:10 GH  Please call as needed

## 2013-12-01 ENCOUNTER — Telehealth: Payer: Self-pay

## 2013-12-01 NOTE — Telephone Encounter (Signed)
Received a prior authorization notice from [facility] on [address] that Azelastine nasal spray needs prior authorization. This was done and [facility]. Received notification back from [facility] stating prior authorization is not required at this time for Azelastine hcl. The pharmacy is to contact the help desk. This info was faxed to the pharmacy.

## 2013-12-04 ENCOUNTER — Other Ambulatory Visit: Payer: Self-pay | Admitting: Gastroenterology

## 2013-12-06 ENCOUNTER — Telehealth: Payer: Self-pay | Admitting: Neurology

## 2013-12-06 NOTE — Telephone Encounter (Signed)
Patient would like call back from the nurse to see if she really needs to come to her follow up appointment as she has many doctor appointments and would like to cancel the ones that are not necessary. Please call to advise.

## 2013-12-06 NOTE — Telephone Encounter (Signed)
Please advise 

## 2013-12-06 NOTE — Telephone Encounter (Signed)
Please call patient, it is okay to keep followup with her primary care physician, [facility] for worsening memory loss, agitations,

## 2013-12-08 ENCOUNTER — Encounter: Payer: Self-pay | Admitting: Gastroenterology

## 2013-12-08 ENCOUNTER — Ambulatory Visit (INDEPENDENT_AMBULATORY_CARE_PROVIDER_SITE_OTHER): Payer: Medicare Other

## 2013-12-08 ENCOUNTER — Ambulatory Visit (INDEPENDENT_AMBULATORY_CARE_PROVIDER_SITE_OTHER): Payer: Medicare Other | Admitting: Gastroenterology

## 2013-12-08 VITALS — BP 130/78 | HR 54 | Ht 61.0 in | Wt 189.4 lb

## 2013-12-08 DIAGNOSIS — K3184 Gastroparesis: Secondary | ICD-10-CM

## 2013-12-08 DIAGNOSIS — R197 Diarrhea, unspecified: Secondary | ICD-10-CM

## 2013-12-08 DIAGNOSIS — J309 Allergic rhinitis, unspecified: Secondary | ICD-10-CM

## 2013-12-08 MED ORDER — CHOLESTYRAMINE LIGHT 4 G PO PACK: 4.0000 g | PACK | Freq: Two times a day (BID) | ORAL | Status: DC

## 2013-12-08 NOTE — Assessment & Plan Note (Signed)
Etiology for diarrhea has not been determined.  Antibiotics did not have a significant impact on diarrhea rendering bacterial overgrowth less likely.  She states that she has taken Imodium in the past but that has caused bloating.  Recommendations #1 trial of cholestyramine one packet twice a day

## 2013-12-08 NOTE — Telephone Encounter (Signed)
Called patient to inform her that Dr. Terrace ArabiaYan would like for her to keep any upcoming appts. And if she has any other problems, questions or concerns to call the office. Patient verbalized understanding.

## 2013-12-08 NOTE — Assessment & Plan Note (Signed)
She has occasional nausea and bloating.  Symptoms are mild.  Recommendations #1 patient will consider enrollment in a gastroparesis study

## 2013-12-08 NOTE — Patient Instructions (Signed)
Research is speaking with you today We are sending in your prescription to your pharmacy Follow up in 6 weeks

## 2013-12-08 NOTE — Progress Notes (Signed)
          History of Present Illness:  The patient has been taking ampicillin daily rather than one week out of the month.  When she takes antibiotics stool frequency is slightly decreased and there is less urgency.  If she is off antibiotics for 2 days at a the time symptoms seem to worsen.  Etiology for the diarrhea has not been determined.  She has gastroparesis.    Review of Systems: Pertinent positive and negative review of systems were noted in the above HPI section. All other review of systems were otherwise negative.    Current Medications, Allergies, Past Medical History, Past Surgical History, Family History and Social History were reviewed in [facility] electronic medical record  Vital signs were reviewed in today's medical record. Physical Exam: General: Well developed , well nourished, no acute distress   See Assessment and Plan under Problem List

## 2013-12-13 ENCOUNTER — Telehealth: Payer: Self-pay | Admitting: Internal Medicine

## 2013-12-13 ENCOUNTER — Telehealth: Payer: Self-pay | Admitting: Gastroenterology

## 2013-12-13 MED ORDER — DOXYCYCLINE HYCLATE 100 MG PO CAPS: ORAL_CAPSULE | ORAL | Status: DC

## 2013-12-13 NOTE — Telephone Encounter (Signed)
Discussed with pt that she may try taking 1 packet of cholestyramine a day instead of BID and see if that helps. Pt verbalized understanding.

## 2013-12-13 NOTE — Telephone Encounter (Signed)
Spoke w/ pt and aware of recs. RX sent

## 2013-12-13 NOTE — Telephone Encounter (Signed)
Spoke with pt. She c/o sinus HA, sinus pressure, nasal congestion, PND, runny nose, chills x couple days. NO fever, no sweats, no wheezing, no cough, no chest tx. Pt is requesting doxy capsule. Pt is not taking anything OTC. Please advise Dr. Maple Hudson thanks  Allergies  Allergen Reactions  . Aspirin Itching  . Codeine Itching  . Demerol Itching    Unknown-unconscious.  . Latex Itching  . Lisinopril Itching and Cough    REACTION: causes cough    Current Outpatient Prescriptions on File Prior to Visit  Medication Sig Dispense Refill  . ampicillin (PRINCIPEN) 250 MG capsule TAKE ONE CAPSULE BY MOUTH FOUR TIMES DAILY  30 capsule  0  . ampicillin (PRINCIPEN) 250 MG capsule TAKE ONE CAPSULE BY MOUTH FOUR TIMES DAILY  30 capsule  0  . azelastine (ASTELIN) 137 MCG/SPRAY nasal spray 1-2 puffs each nostril, once or twice daily when needed  30 mL  prn  . budesonide-formoterol (SYMBICORT) 80-4.5 MCG/ACT inhaler Inhale 2 puffs into the lungs 2 (two) times daily.      . calcium carbonate (OS-CAL) 600 MG TABS tablet Take 600 mg by mouth daily with breakfast.      . cholestyramine light (PREVALITE) 4 G packet Take 1 packet (4 g total) by mouth 2 (two) times daily.  60 packet  3  . clonazePAM (KLONOPIN) 1 MG tablet TAKE 1 TABLET BY MOUTH EVERY NIGHT AT BEDTIME AS NEEDED FOR ANXIETY  30 tablet  0  . donepezil (ARICEPT) 10 MG tablet Take 1 tablet (10 mg total) by mouth at bedtime.  30 tablet  12  . ezetimibe (ZETIA) 10 MG tablet Take 10 mg by mouth daily.      . fish oil-omega-3 fatty acids 1000 MG capsule Take 2,000 mg by mouth daily.       . furosemide (LASIX) 40 MG tablet Take 40 mg by mouth daily.      Marland Kitchen glucose blood test strip Use as instructed ACCUCHECK AVIVA TEST STRIPS , USE AS DIRECTED ONE TIME A DAY,  DIAGNOSIS CODE 250.0.  100 each  6  . HYDROcodone-acetaminophen (NORCO/VICODIN) 5-325 MG per tablet Take 1 tablet by mouth every 8 (eight) hours as needed for pain. FILL ON OR AFTER 12/02/2013  90 tablet   0  . insulin glargine (LANTUS) 100 UNIT/ML injection sliding scale Pt uses between 20-30 units daily      . levalbuterol (XOPENEX) 0.63 MG/3ML nebulizer solution Take 1 ampule by nebulization every 6 (six) hours as needed. For wheezing      . levothyroxine (SYNTHROID) 88 MCG tablet Take 1 tablet (88 mcg total) by mouth daily.  90 tablet  3  . Memantine HCl ER 28 MG CP24 Take 28 mg by mouth daily.  90 capsule  3  . metFORMIN (GLUCOPHAGE) 1000 MG tablet TAKE 1 TABLET BY MOUTH TWICE DAILY WITH A MEAL  180 tablet  3  . Multiple Vitamins-Minerals (CENTRUM SILVER PO) Take 1 tablet by mouth daily.      . permethrin (ELIMITE) 5 % cream APPLY TOPICALLY ONCE AS DIRECTED  60 g  1  . pravastatin (PRAVACHOL) 20 MG tablet Take 1 tablet (20 mg total) by mouth every evening.  30 tablet  11  . PREMARIN vaginal cream APPLY VAGINALLY DAILY FOR 10 DAYS, THEN TWICE A WEEK  30 g  0  . tiotropium (SPIRIVA) 18 MCG inhalation capsule Place 18 mcg into inhaler and inhale daily.      . valsartan (DIOVAN) 160 MG  tablet Take 160 mg by mouth daily.      . vitamin A 8000 UNIT capsule Take 8,000 Units by mouth daily.      . [DISCONTINUED] Calcium Carbonate-Vitamin D (CALCIUM 600/VITAMIN D) 600-400 MG-UNIT per tablet Take 1 tablet by mouth 2 (two) times daily.        . [DISCONTINUED] sodium chloride (OCEAN) 0.65 % nasal spray Place 2 sprays into the nose as needed. TO OPEN NASAL BREATHING       No current facility-administered medications on file prior to visit.

## 2013-12-13 NOTE — Telephone Encounter (Signed)
Per CY-okay to give Doxycycline 100 mg #8 take 2 today then 1 daily no refills. Thanks.

## 2013-12-15 ENCOUNTER — Telehealth: Payer: Self-pay | Admitting: Internal Medicine

## 2013-12-15 ENCOUNTER — Ambulatory Visit (INDEPENDENT_AMBULATORY_CARE_PROVIDER_SITE_OTHER): Payer: Medicare Other

## 2013-12-15 DIAGNOSIS — J309 Allergic rhinitis, unspecified: Secondary | ICD-10-CM

## 2013-12-15 NOTE — Telephone Encounter (Signed)
Spoke with [name]. She reports she will try her best to make sure pt has transportation every week. Nothing further needed

## 2013-12-15 NOTE — Telephone Encounter (Signed)
No- sorry, we can no longer do that. It may be best to stop allergy shots if she can't get here.

## 2013-12-15 NOTE — Telephone Encounter (Signed)
I spoke with Martie LeeSabrina (daughter in law). Pt does not have transportation and was told to ask us if pt could administer her allergy shot at home to herself. Please advise Dr. Maple HudsonYoung thanks

## 2013-12-18 ENCOUNTER — Ambulatory Visit (INDEPENDENT_AMBULATORY_CARE_PROVIDER_SITE_OTHER): Payer: Medicare Other

## 2013-12-18 DIAGNOSIS — J309 Allergic rhinitis, unspecified: Secondary | ICD-10-CM

## 2013-12-20 ENCOUNTER — Telehealth: Payer: Self-pay | Admitting: Internal Medicine

## 2013-12-20 ENCOUNTER — Encounter: Payer: Self-pay | Admitting: Internal Medicine

## 2013-12-20 NOTE — Telephone Encounter (Signed)
Pt wants to know if she can get 2 elbow braces and 2 knee braces because she has fallen in the past months.  She wants to order from [facility].  They have faxed requests here.

## 2013-12-20 NOTE — Telephone Encounter (Signed)
Needs evaluation to determine need for braces - please schedule with Dr. Katrinka BlazingSmith.

## 2013-12-20 NOTE — Assessment & Plan Note (Signed)
Good control. She really wants to continue brand-name Astelin nasal spray-discussed We can continue allergy vaccine 1:10 GH

## 2013-12-21 NOTE — Telephone Encounter (Signed)
Appt on Jan. 26 with Dr. Katrinka BlazingSmith.

## 2013-12-25 ENCOUNTER — Ambulatory Visit (INDEPENDENT_AMBULATORY_CARE_PROVIDER_SITE_OTHER): Payer: Medicare Other

## 2013-12-25 ENCOUNTER — Ambulatory Visit: Payer: Medicare Other | Admitting: Family Medicine

## 2013-12-25 DIAGNOSIS — J309 Allergic rhinitis, unspecified: Secondary | ICD-10-CM

## 2013-12-26 ENCOUNTER — Telehealth: Payer: Self-pay | Admitting: Internal Medicine

## 2013-12-26 NOTE — Telephone Encounter (Signed)
lmtcb x1 

## 2013-12-26 NOTE — Telephone Encounter (Signed)
Spoke with pt. Reports sore throat x2 days. Worse this AM when she woke up. Also reports headache, ear pain, coughing and runny nose. Denies fever/chills. Has tried OTC allergy medicine without relief. Would like CY's recs.  Allergies  Allergen Reactions  . Aspirin Itching  . Codeine Itching  . Demerol Itching    Unknown-unconscious.  . Latex Itching  . Lisinopril Itching and Cough    REACTION: causes cough    Current Outpatient Prescriptions on File Prior to Visit  Medication Sig Dispense Refill  . ampicillin (PRINCIPEN) 250 MG capsule TAKE ONE CAPSULE BY MOUTH FOUR TIMES DAILY  30 capsule  0  . ampicillin (PRINCIPEN) 250 MG capsule TAKE ONE CAPSULE BY MOUTH FOUR TIMES DAILY  30 capsule  0  . azelastine (ASTELIN) 137 MCG/SPRAY nasal spray 1-2 puffs each nostril, once or twice daily when needed  30 mL  prn  . budesonide-formoterol (SYMBICORT) 80-4.5 MCG/ACT inhaler Inhale 2 puffs into the lungs 2 (two) times daily.      . calcium carbonate (OS-CAL) 600 MG TABS tablet Take 600 mg by mouth daily with breakfast.      . cholestyramine light (PREVALITE) 4 G packet Take 1 packet (4 g total) by mouth 2 (two) times daily.  60 packet  3  . clonazePAM (KLONOPIN) 1 MG tablet TAKE 1 TABLET BY MOUTH EVERY NIGHT AT BEDTIME AS NEEDED FOR ANXIETY  30 tablet  0  . donepezil (ARICEPT) 10 MG tablet Take 1 tablet (10 mg total) by mouth at bedtime.  30 tablet  12  . doxycycline (VIBRAMYCIN) 100 MG capsule Take 2 capsules today then 1 daily until done  8 capsule  0  . ezetimibe (ZETIA) 10 MG tablet Take 10 mg by mouth daily.      . fish oil-omega-3 fatty acids 1000 MG capsule Take 2,000 mg by mouth daily.       . furosemide (LASIX) 40 MG tablet Take 40 mg by mouth daily.      Marland Kitchen. glucose blood test strip Use as instructed ACCUCHECK AVIVA TEST STRIPS , USE AS DIRECTED ONE TIME A DAY,  DIAGNOSIS CODE 250.0.  100 each  6  . HYDROcodone-acetaminophen (NORCO/VICODIN) 5-325 MG per tablet Take 1 tablet by mouth  every 8 (eight) hours as needed for pain. FILL ON OR AFTER 12/02/2013  90 tablet  0  . insulin glargine (LANTUS) 100 UNIT/ML injection sliding scale Pt uses between 20-30 units daily      . levalbuterol (XOPENEX) 0.63 MG/3ML nebulizer solution Take 1 ampule by nebulization every 6 (six) hours as needed. For wheezing      . levothyroxine (SYNTHROID) 88 MCG tablet Take 1 tablet (88 mcg total) by mouth daily.  90 tablet  3  . Memantine HCl ER 28 MG CP24 Take 28 mg by mouth daily.  90 capsule  3  . metFORMIN (GLUCOPHAGE) 1000 MG tablet TAKE 1 TABLET BY MOUTH TWICE DAILY WITH A MEAL  180 tablet  3  . Multiple Vitamins-Minerals (CENTRUM SILVER PO) Take 1 tablet by mouth daily.      . permethrin (ELIMITE) 5 % cream APPLY TOPICALLY ONCE AS DIRECTED  60 g  1  . pravastatin (PRAVACHOL) 20 MG tablet Take 1 tablet (20 mg total) by mouth every evening.  30 tablet  11  . PREMARIN vaginal cream APPLY VAGINALLY DAILY FOR 10 DAYS, THEN TWICE A WEEK  30 g  0  . tiotropium (SPIRIVA) 18 MCG inhalation capsule Place 18 mcg into inhaler  and inhale daily.      . valsartan (DIOVAN) 160 MG tablet Take 160 mg by mouth daily.      . vitamin A 8000 UNIT capsule Take 8,000 Units by mouth daily.      . [DISCONTINUED] Calcium Carbonate-Vitamin D (CALCIUM 600/VITAMIN D) 600-400 MG-UNIT per tablet Take 1 tablet by mouth 2 (two) times daily.        . [DISCONTINUED] sodium chloride (OCEAN) 0.65 % nasal spray Place 2 sprays into the nose as needed. TO OPEN NASAL BREATHING       No current facility-administered medications on file prior to visit.    CY - please advise. Thanks.

## 2013-12-26 NOTE — Telephone Encounter (Signed)
Pt advised. Cassity Christian, CMA  

## 2013-12-26 NOTE — Telephone Encounter (Signed)
This is likely viral upper respiratory infection. It will have to run its course. OK to try something like Advil cold and sinus or something similar.

## 2013-12-27 ENCOUNTER — Other Ambulatory Visit: Payer: Self-pay | Admitting: Internal Medicine

## 2013-12-28 ENCOUNTER — Ambulatory Visit: Payer: Medicare Other

## 2014-01-01 ENCOUNTER — Ambulatory Visit: Payer: Medicare Other

## 2014-01-01 ENCOUNTER — Other Ambulatory Visit: Payer: Self-pay | Admitting: *Deleted

## 2014-01-01 ENCOUNTER — Telehealth: Payer: Self-pay | Admitting: *Deleted

## 2014-01-01 ENCOUNTER — Ambulatory Visit (INDEPENDENT_AMBULATORY_CARE_PROVIDER_SITE_OTHER): Payer: Medicare Other

## 2014-01-01 DIAGNOSIS — J309 Allergic rhinitis, unspecified: Secondary | ICD-10-CM

## 2014-01-01 MED ORDER — HYDROCODONE-ACETAMINOPHEN 5-325 MG PO TABS: 1.0000 | ORAL_TABLET | Freq: Three times a day (TID) | ORAL | Status: DC | PRN

## 2014-01-01 NOTE — Telephone Encounter (Signed)
Scripts printed and awaiting MD signature.  Notified patient they would be available for pick up this afternoon.

## 2014-01-01 NOTE — Telephone Encounter (Signed)
Patient phoned at left message on triage voicemail at  Today, requesting to be able to pick up her hydrocodone prescription at 1330. Last OV with PCP 09/26/13.  Please advise.   CB# [phone]

## 2014-01-01 NOTE — Telephone Encounter (Signed)
Patient had family member phone again at 1310 demanding hydrocodone script refill.

## 2014-01-01 NOTE — Telephone Encounter (Signed)
Ok for refill, #90 x 2 RX

## 2014-01-07 ENCOUNTER — Other Ambulatory Visit: Payer: Self-pay | Admitting: Internal Medicine

## 2014-01-08 ENCOUNTER — Ambulatory Visit (INDEPENDENT_AMBULATORY_CARE_PROVIDER_SITE_OTHER): Payer: Medicare Other

## 2014-01-08 DIAGNOSIS — J309 Allergic rhinitis, unspecified: Secondary | ICD-10-CM

## 2014-01-10 ENCOUNTER — Telehealth: Payer: Self-pay | Admitting: *Deleted

## 2014-01-10 MED ORDER — DOXYCYCLINE HYCLATE 100 MG PO TABS: ORAL_TABLET | ORAL | Status: DC

## 2014-01-10 MED ORDER — PREDNISONE 10 MG PO TABS: ORAL_TABLET | ORAL | Status: DC

## 2014-01-10 NOTE — Telephone Encounter (Signed)
Called spoke with patient, advised of CDY's recs as stated below. Pt verbalized her understanding and denied any questions. Rx's sent to verified pharmacy.   Pt aware to call the office if her symptoms do not improve or worsen. Nothing further needed; will sign off.

## 2014-01-10 NOTE — Telephone Encounter (Signed)
Per CDY doxy 100 mg 2 today then 1 daily #8 pred 10 mg 8 day taper   ATC pt line rang several times and no answer and no VM WCB

## 2014-01-10 NOTE — Telephone Encounter (Signed)
Pt c/o severe sore throat, runny nose, PND, wheezing this AM, prod cough w/ white phlem, nasal cong, chills/sweats, HA x 2 weeks getting worse. No fever, no body aches. Pt is requesting doxy capsules. She is not taking anything OTC. Please advise Dr. Maple HudsonYoung thanks  Allergies  Allergen Reactions  . Aspirin Itching  . Codeine Itching  . Demerol Itching    Unknown-unconscious.  . Latex Itching  . Lisinopril Itching and Cough    REACTION: causes cough    Current Outpatient Prescriptions on File Prior to Visit  Medication Sig Dispense Refill  . ampicillin (PRINCIPEN) 250 MG capsule TAKE ONE CAPSULE BY MOUTH FOUR TIMES DAILY  30 capsule  0  . ampicillin (PRINCIPEN) 250 MG capsule TAKE ONE CAPSULE BY MOUTH FOUR TIMES DAILY  30 capsule  0  . azelastine (ASTELIN) 137 MCG/SPRAY nasal spray 1-2 puffs each nostril, once or twice daily when needed  30 mL  prn  . budesonide-formoterol (SYMBICORT) 80-4.5 MCG/ACT inhaler Inhale 2 puffs into the lungs 2 (two) times daily.      . calcium carbonate (OS-CAL) 600 MG TABS tablet Take 600 mg by mouth daily with breakfast.      . cholestyramine light (PREVALITE) 4 G packet Take 1 packet (4 g total) by mouth 2 (two) times daily.  60 packet  3  . clonazePAM (KLONOPIN) 1 MG tablet TAKE 1 TABLET BY MOUTH EVERY NIGHT AT BEDTIME AS NEEDED FOR ANXIETY  30 tablet  0  . donepezil (ARICEPT) 10 MG tablet Take 1 tablet (10 mg total) by mouth at bedtime.  30 tablet  12  . doxycycline (VIBRAMYCIN) 100 MG capsule Take 2 capsules today then 1 daily until done  8 capsule  0  . ezetimibe (ZETIA) 10 MG tablet Take 10 mg by mouth daily.      . fish oil-omega-3 fatty acids 1000 MG capsule Take 2,000 mg by mouth daily.       . furosemide (LASIX) 40 MG tablet Take 40 mg by mouth daily.      Marland Kitchen. glucose blood test strip Use as instructed ACCUCHECK AVIVA TEST STRIPS , USE AS DIRECTED ONE TIME A DAY,  DIAGNOSIS CODE 250.0.  100 each  6  . HYDROcodone-acetaminophen (NORCO/VICODIN) 5-325 MG  per tablet Take 1 tablet by mouth every 8 (eight) hours as needed. FILL ON OR AFTER 03/02/2014  90 tablet  0  . insulin glargine (LANTUS) 100 UNIT/ML injection sliding scale Pt uses between 20-30 units daily      . levalbuterol (XOPENEX) 0.63 MG/3ML nebulizer solution Take 1 ampule by nebulization every 6 (six) hours as needed. For wheezing      . levothyroxine (SYNTHROID) 88 MCG tablet Take 1 tablet (88 mcg total) by mouth daily.  90 tablet  3  . Memantine HCl ER 28 MG CP24 Take 28 mg by mouth daily.  90 capsule  3  . metFORMIN (GLUCOPHAGE) 1000 MG tablet TAKE 1 TABLET BY MOUTH TWICE DAILY WITH A MEAL  180 tablet  3  . Multiple Vitamins-Minerals (CENTRUM SILVER PO) Take 1 tablet by mouth daily.      . permethrin (ELIMITE) 5 % cream APPLY TOPICALLY ONCE AS DIRECTED  60 g  1  . pravastatin (PRAVACHOL) 20 MG tablet Take 1 tablet (20 mg total) by mouth every evening.  30 tablet  11  . PREMARIN vaginal cream APPLY VAGINALLY DAILY FOR 10 DAYS, THEN TWICE A WEEK  30 g  0  . SPIRIVA HANDIHALER 18 MCG  inhalation capsule PLACE 1 CAPSULE INTO INHALER AND INHALE DAILY.  30 capsule  5  . tiotropium (SPIRIVA) 18 MCG inhalation capsule Place 18 mcg into inhaler and inhale daily.      . valsartan (DIOVAN) 160 MG tablet Take 160 mg by mouth daily.      . vitamin A 8000 UNIT capsule Take 8,000 Units by mouth daily.      . [DISCONTINUED] Calcium Carbonate-Vitamin D (CALCIUM 600/VITAMIN D) 600-400 MG-UNIT per tablet Take 1 tablet by mouth 2 (two) times daily.        . [DISCONTINUED] sodium chloride (OCEAN) 0.65 % nasal spray Place 2 sprays into the nose as needed. TO OPEN NASAL BREATHING       No current facility-administered medications on file prior to visit.

## 2014-01-12 ENCOUNTER — Telehealth: Payer: Self-pay | Admitting: *Deleted

## 2014-01-12 NOTE — Telephone Encounter (Signed)
I called pt and spoke to her about the enrichment form which we were filling out.  She stated that we did not need to do this.  I attempted to call her daughter and tele # was not her's.

## 2014-01-17 ENCOUNTER — Telehealth: Payer: Self-pay | Admitting: Internal Medicine

## 2014-01-17 NOTE — Telephone Encounter (Signed)
ATC pt x1, answering picked up then the line went silent. ATC pt again, no ringing/dialtone [facility]

## 2014-01-18 MED ORDER — AMOXICILLIN-POT CLAVULANATE 875-125 MG PO TABS: 1.0000 | ORAL_TABLET | Freq: Two times a day (BID) | ORAL | Status: DC

## 2014-01-18 NOTE — Telephone Encounter (Signed)
Called and spoke with pt. She reports she is still having nasal congestion w/ green phlem d/c x couple weeks. Pt has already been on 4 rounds of doxycyline in the past month and pt not getting better. She has tried netti pot and nasal saline spray. Pt is requesting doxy again. Please advise Dr. Maple HudsonYoung thanks  Allergies  Allergen Reactions  . Aspirin Itching  . Codeine Itching  . Demerol Itching    Unknown-unconscious.  . Latex Itching  . Lisinopril Itching and Cough    REACTION: causes cough     Current Outpatient Prescriptions on File Prior to Visit  Medication Sig Dispense Refill  . ampicillin (PRINCIPEN) 250 MG capsule TAKE ONE CAPSULE BY MOUTH FOUR TIMES DAILY  30 capsule  0  . ampicillin (PRINCIPEN) 250 MG capsule TAKE ONE CAPSULE BY MOUTH FOUR TIMES DAILY  30 capsule  0  . azelastine (ASTELIN) 137 MCG/SPRAY nasal spray 1-2 puffs each nostril, once or twice daily when needed  30 mL  prn  . budesonide-formoterol (SYMBICORT) 80-4.5 MCG/ACT inhaler Inhale 2 puffs into the lungs 2 (two) times daily.      . calcium carbonate (OS-CAL) 600 MG TABS tablet Take 600 mg by mouth daily with breakfast.      . cholestyramine light (PREVALITE) 4 G packet Take 1 packet (4 g total) by mouth 2 (two) times daily.  60 packet  3  . clonazePAM (KLONOPIN) 1 MG tablet TAKE 1 TABLET BY MOUTH EVERY NIGHT AT BEDTIME AS NEEDED FOR ANXIETY  30 tablet  0  . donepezil (ARICEPT) 10 MG tablet Take 1 tablet (10 mg total) by mouth at bedtime.  30 tablet  12  . doxycycline (VIBRA-TABS) 100 MG tablet Take 2 today, then 1 daily until gone.  8 tablet  0  . ezetimibe (ZETIA) 10 MG tablet Take 10 mg by mouth daily.      . fish oil-omega-3 fatty acids 1000 MG capsule Take 2,000 mg by mouth daily.       . furosemide (LASIX) 40 MG tablet Take 40 mg by mouth daily.      Marland Kitchen. glucose blood test strip Use as instructed ACCUCHECK AVIVA TEST STRIPS , USE AS DIRECTED ONE TIME A DAY,  DIAGNOSIS CODE 250.0.  100 each  6  .  HYDROcodone-acetaminophen (NORCO/VICODIN) 5-325 MG per tablet Take 1 tablet by mouth every 8 (eight) hours as needed. FILL ON OR AFTER 03/02/2014  90 tablet  0  . insulin glargine (LANTUS) 100 UNIT/ML injection sliding scale Pt uses between 20-30 units daily      . levalbuterol (XOPENEX) 0.63 MG/3ML nebulizer solution Take 1 ampule by nebulization every 6 (six) hours as needed. For wheezing      . levothyroxine (SYNTHROID) 88 MCG tablet Take 1 tablet (88 mcg total) by mouth daily.  90 tablet  3  . Memantine HCl ER 28 MG CP24 Take 28 mg by mouth daily.  90 capsule  3  . metFORMIN (GLUCOPHAGE) 1000 MG tablet TAKE 1 TABLET BY MOUTH TWICE DAILY WITH A MEAL  180 tablet  3  . Multiple Vitamins-Minerals (CENTRUM SILVER PO) Take 1 tablet by mouth daily.      . permethrin (ELIMITE) 5 % cream APPLY TOPICALLY ONCE AS DIRECTED  60 g  1  . pravastatin (PRAVACHOL) 20 MG tablet Take 1 tablet (20 mg total) by mouth every evening.  30 tablet  11  . predniSONE (DELTASONE) 10 MG tablet Take 4 tabs by mouth x 2 days,  3 tabs x 2 days, 2 tabs x 2 days, 1 tab x 2 days and stop.  20 tablet  0  . PREMARIN vaginal cream APPLY VAGINALLY DAILY FOR 10 DAYS, THEN TWICE A WEEK  30 g  0  . SPIRIVA HANDIHALER 18 MCG inhalation capsule PLACE 1 CAPSULE INTO INHALER AND INHALE DAILY.  30 capsule  5  . tiotropium (SPIRIVA) 18 MCG inhalation capsule Place 18 mcg into inhaler and inhale daily.      . valsartan (DIOVAN) 160 MG tablet Take 160 mg by mouth daily.      . vitamin A 8000 UNIT capsule Take 8,000 Units by mouth daily.      . [DISCONTINUED] Calcium Carbonate-Vitamin D (CALCIUM 600/VITAMIN D) 600-400 MG-UNIT per tablet Take 1 tablet by mouth 2 (two) times daily.        . [DISCONTINUED] sodium chloride (OCEAN) 0.65 % nasal spray Place 2 sprays into the nose as needed. TO OPEN NASAL BREATHING       No current facility-administered medications on file prior to visit.

## 2014-01-18 NOTE — Telephone Encounter (Signed)
Recommend augmentin 875 mg, # 14, 1 twice daily  If this doesn't clear her then we will probably need to get a CT of her sinuses to see what is going on

## 2014-01-18 NOTE — Telephone Encounter (Signed)
Spoke with the pt and notified of recs per CDY  She verbalized understanding  Rx was sent to pharm  

## 2014-01-18 NOTE — Telephone Encounter (Signed)
LMTCB

## 2014-01-19 ENCOUNTER — Ambulatory Visit (INDEPENDENT_AMBULATORY_CARE_PROVIDER_SITE_OTHER): Payer: Medicare Other

## 2014-01-19 DIAGNOSIS — J309 Allergic rhinitis, unspecified: Secondary | ICD-10-CM

## 2014-01-25 ENCOUNTER — Other Ambulatory Visit: Payer: Self-pay | Admitting: Internal Medicine

## 2014-01-26 ENCOUNTER — Ambulatory Visit (INDEPENDENT_AMBULATORY_CARE_PROVIDER_SITE_OTHER): Payer: Medicare Other

## 2014-01-26 DIAGNOSIS — J309 Allergic rhinitis, unspecified: Secondary | ICD-10-CM

## 2014-01-29 ENCOUNTER — Ambulatory Visit: Payer: Medicare Other | Admitting: Internal Medicine

## 2014-01-29 ENCOUNTER — Telehealth: Payer: Self-pay | Admitting: Internal Medicine

## 2014-01-29 NOTE — Telephone Encounter (Signed)
lmtcb x1 

## 2014-01-29 NOTE — Telephone Encounter (Signed)
[name] returning triage's call.  Antionette FairyHolly D Pryor

## 2014-01-29 NOTE — Telephone Encounter (Signed)
lmomctb

## 2014-01-30 ENCOUNTER — Telehealth: Payer: Self-pay

## 2014-01-30 NOTE — Telephone Encounter (Signed)
Duplicate message. Jennifer Castillo, CMA  

## 2014-01-30 NOTE — Telephone Encounter (Signed)
[name] called requesting refill on patient's Hydrocodone. I let her know patient just received scripts for Feb, March, Apr. She states they can not be found. I did advise new ones can't be printed unless old scripts are in hand. [name] says she will continue to look for them.

## 2014-01-31 ENCOUNTER — Telehealth: Payer: Self-pay

## 2014-01-31 MED ORDER — HYDROCODONE-ACETAMINOPHEN 5-325 MG PO TABS: 1.0000 | ORAL_TABLET | Freq: Three times a day (TID) | ORAL | Status: DC | PRN

## 2014-01-31 NOTE — Telephone Encounter (Signed)
I called and talked [name] and let her know she can come by and pick up scripts for Hydrocodone for march and April. Patient already had Feb filled.

## 2014-02-02 ENCOUNTER — Ambulatory Visit: Payer: Medicare Other

## 2014-02-05 ENCOUNTER — Other Ambulatory Visit (INDEPENDENT_AMBULATORY_CARE_PROVIDER_SITE_OTHER): Payer: Medicare Other

## 2014-02-05 ENCOUNTER — Other Ambulatory Visit: Payer: Medicare Other

## 2014-02-05 DIAGNOSIS — Z79899 Other long term (current) drug therapy: Secondary | ICD-10-CM

## 2014-02-05 DIAGNOSIS — E785 Hyperlipidemia, unspecified: Secondary | ICD-10-CM

## 2014-02-05 LAB — LIPID PANEL
CHOLESTEROL: 178 mg/dL (ref 0–200)
HDL: 48.5 mg/dL (ref >39.00)
LDL CALC: 93 mg/dL (ref 0–99)
Total CHOL/HDL Ratio: 4
Triglycerides: 181 mg/dL — ABNORMAL HIGH (ref 0.0–149.0)
VLDL: 36.2 mg/dL (ref 0.0–40.0)

## 2014-02-05 LAB — HEPATIC FUNCTION PANEL
ALK PHOS: 62 U/L (ref 39–117)
ALT: 11 U/L (ref 0–35)
AST: 19 U/L (ref 0–37)
Albumin: 3.9 g/dL (ref 3.5–5.2)
BILIRUBIN DIRECT: 0.1 mg/dL (ref 0.0–0.3)
BILIRUBIN TOTAL: 0.6 mg/dL (ref 0.3–1.2)
TOTAL PROTEIN: 6.8 g/dL (ref 6.0–8.3)

## 2014-02-06 ENCOUNTER — Ambulatory Visit: Payer: Medicare Other | Admitting: Pharmacist

## 2014-02-06 ENCOUNTER — Telehealth: Payer: Self-pay | Admitting: *Deleted

## 2014-02-06 ENCOUNTER — Ambulatory Visit: Payer: Medicare Other | Admitting: Adult Health

## 2014-02-06 NOTE — Telephone Encounter (Signed)
Labs are normal. Without MyChart lab letters get generated on Friday.

## 2014-02-06 NOTE — Telephone Encounter (Signed)
Patient phoned requesting results from 02/05/14.  Please advise.  CB# [phone]

## 2014-02-07 NOTE — Telephone Encounter (Signed)
Phoned patient & relayed MD response.

## 2014-02-08 ENCOUNTER — Encounter: Payer: Self-pay | Admitting: Internal Medicine

## 2014-02-08 ENCOUNTER — Other Ambulatory Visit (INDEPENDENT_AMBULATORY_CARE_PROVIDER_SITE_OTHER): Payer: Medicare Other

## 2014-02-08 ENCOUNTER — Telehealth: Payer: Self-pay | Admitting: *Deleted

## 2014-02-08 ENCOUNTER — Ambulatory Visit (INDEPENDENT_AMBULATORY_CARE_PROVIDER_SITE_OTHER): Payer: Medicare Other | Admitting: Internal Medicine

## 2014-02-08 ENCOUNTER — Ambulatory Visit (INDEPENDENT_AMBULATORY_CARE_PROVIDER_SITE_OTHER): Payer: Medicare Other

## 2014-02-08 VITALS — BP 138/88 | HR 72 | Temp 97.0°F | Resp 16 | Ht 63.0 in | Wt 190.0 lb

## 2014-02-08 DIAGNOSIS — J449 Chronic obstructive pulmonary disease, unspecified: Secondary | ICD-10-CM

## 2014-02-08 DIAGNOSIS — R197 Diarrhea, unspecified: Secondary | ICD-10-CM

## 2014-02-08 DIAGNOSIS — R5383 Other fatigue: Secondary | ICD-10-CM

## 2014-02-08 DIAGNOSIS — Z0000 Encounter for general adult medical examination without abnormal findings: Secondary | ICD-10-CM

## 2014-02-08 DIAGNOSIS — E538 Deficiency of other specified B group vitamins: Secondary | ICD-10-CM

## 2014-02-08 DIAGNOSIS — F02818 Dementia in other diseases classified elsewhere, unspecified severity, with other behavioral disturbance: Secondary | ICD-10-CM

## 2014-02-08 DIAGNOSIS — I10 Essential (primary) hypertension: Secondary | ICD-10-CM

## 2014-02-08 DIAGNOSIS — R739 Hyperglycemia, unspecified: Secondary | ICD-10-CM

## 2014-02-08 DIAGNOSIS — G300 Alzheimer's disease with early onset: Secondary | ICD-10-CM

## 2014-02-08 DIAGNOSIS — F0281 Dementia in other diseases classified elsewhere with behavioral disturbance: Secondary | ICD-10-CM

## 2014-02-08 DIAGNOSIS — R7309 Other abnormal glucose: Secondary | ICD-10-CM

## 2014-02-08 DIAGNOSIS — F22 Delusional disorders: Secondary | ICD-10-CM

## 2014-02-08 DIAGNOSIS — R5381 Other malaise: Secondary | ICD-10-CM

## 2014-02-08 DIAGNOSIS — F0280 Dementia in other diseases classified elsewhere without behavioral disturbance: Secondary | ICD-10-CM

## 2014-02-08 DIAGNOSIS — G309 Alzheimer's disease, unspecified: Secondary | ICD-10-CM

## 2014-02-08 DIAGNOSIS — F0282 Dementia in other diseases classified elsewhere, unspecified severity, with psychotic disturbance: Secondary | ICD-10-CM

## 2014-02-08 DIAGNOSIS — J309 Allergic rhinitis, unspecified: Secondary | ICD-10-CM

## 2014-02-08 LAB — URINALYSIS, ROUTINE W REFLEX MICROSCOPIC
Bilirubin Urine: NEGATIVE
Hgb urine dipstick: NEGATIVE
KETONES UR: NEGATIVE
Nitrite: NEGATIVE
SPECIFIC GRAVITY, URINE: 1.01 (ref 1.000–1.030)
Total Protein, Urine: NEGATIVE
URINE GLUCOSE: NEGATIVE
Urobilinogen, UA: 0.2 (ref 0.0–1.0)
pH: 5.5 (ref 5.0–8.0)

## 2014-02-08 LAB — BASIC METABOLIC PANEL
BUN: 19 mg/dL (ref 6–23)
CHLORIDE: 105 meq/L (ref 96–112)
CO2: 29 meq/L (ref 19–32)
CREATININE: 1 mg/dL (ref 0.4–1.2)
Calcium: 10.2 mg/dL (ref 8.4–10.5)
GFR: 56.3 mL/min — ABNORMAL LOW (ref >60.00)
Glucose, Bld: 113 mg/dL — ABNORMAL HIGH (ref 70–99)
Potassium: 3.6 mEq/L (ref 3.5–5.1)
SODIUM: 141 meq/L (ref 135–145)

## 2014-02-08 LAB — TSH: TSH: 1.84 u[IU]/mL (ref 0.35–5.50)

## 2014-02-08 LAB — HEPATIC FUNCTION PANEL
ALK PHOS: 63 U/L (ref 39–117)
ALT: 13 U/L (ref 0–35)
AST: 19 U/L (ref 0–37)
Albumin: 4.3 g/dL (ref 3.5–5.2)
BILIRUBIN TOTAL: 0.8 mg/dL (ref 0.3–1.2)
Bilirubin, Direct: 0.1 mg/dL (ref 0.0–0.3)
Total Protein: 7.3 g/dL (ref 6.0–8.3)

## 2014-02-08 LAB — CBC WITH DIFFERENTIAL/PLATELET
Basophils Absolute: 0 10*3/uL (ref 0.0–0.1)
Basophils Relative: 0.4 % (ref 0.0–3.0)
EOS ABS: 0.1 10*3/uL (ref 0.0–0.7)
Eosinophils Relative: 1.2 % (ref 0.0–5.0)
HEMATOCRIT: 39.5 % (ref 36.0–46.0)
Hemoglobin: 13.4 g/dL (ref 12.0–15.0)
Lymphocytes Relative: 36.8 % (ref 12.0–46.0)
Lymphs Abs: 2.5 10*3/uL (ref 0.7–4.0)
MCHC: 33.8 g/dL (ref 30.0–36.0)
MCV: 92.8 fl (ref 78.0–100.0)
Monocytes Absolute: 0.3 10*3/uL (ref 0.1–1.0)
Monocytes Relative: 5.1 % (ref 3.0–12.0)
NEUTROS ABS: 3.8 10*3/uL (ref 1.4–7.7)
Neutrophils Relative %: 56.5 % (ref 43.0–77.0)
Platelets: 220 10*3/uL (ref 150.0–400.0)
RBC: 4.26 Mil/uL (ref 3.87–5.11)
RDW: 13 % (ref 11.5–14.6)
WBC: 6.8 10*3/uL (ref 4.5–10.5)

## 2014-02-08 LAB — LIPID PANEL
CHOL/HDL RATIO: 4
Cholesterol: 172 mg/dL (ref 0–200)
HDL: 45.7 mg/dL (ref >39.00)
LDL Cholesterol: 75 mg/dL (ref 0–99)
Triglycerides: 259 mg/dL — ABNORMAL HIGH (ref 0.0–149.0)
VLDL: 51.8 mg/dL — ABNORMAL HIGH (ref 0.0–40.0)

## 2014-02-08 LAB — HEMOGLOBIN A1C: Hgb A1c MFr Bld: 6.6 % — ABNORMAL HIGH (ref 4.6–6.5)

## 2014-02-08 MED ORDER — CLONAZEPAM 1 MG PO TABS: 1.0000 mg | ORAL_TABLET | Freq: Every evening | ORAL | Status: DC | PRN

## 2014-02-08 NOTE — Progress Notes (Signed)
   Subjective:    Patient ID: [name], female    DOB: 03/23/1946, 68 y.o.   MRN: [id number]  HPI  The patient is here for a wellness exam. The patient has been doing well overall without major physical or psychological issues going on lately. C/o hernia on L side x 10 years  Wt Readings from Last 3 Encounters:  02/08/14 190 lb (86.183 kg)  12/08/13 189 lb 6.4 oz (85.911 kg)  11/28/13 190 lb 6.4 oz (86.365 kg)   BP Readings from Last 3 Encounters:  02/08/14 138/88  12/08/13 130/78  11/28/13 118/60      Review of Systems  Constitutional: Positive for unexpected weight change. Negative for chills, activity change, appetite change and fatigue.  HENT: Negative for congestion, mouth sores and sinus pressure.   Eyes: Negative for visual disturbance.  Respiratory: Negative for cough and chest tightness.   Gastrointestinal: Negative for nausea, vomiting and abdominal pain.  Genitourinary: Negative for urgency, frequency, hematuria, flank pain, difficulty urinating and vaginal pain.  Musculoskeletal: Negative for back pain and gait problem.  Skin: Negative for pallor, rash and wound.  Neurological: Positive for tremors. Negative for dizziness, weakness, numbness and headaches.  Psychiatric/Behavioral: Positive for sleep disturbance. Negative for suicidal ideas and confusion. The patient is nervous/anxious.        Objective:   Physical Exam  Constitutional: She appears well-developed. No distress.  HENT:  Head: Normocephalic.  Right Ear: External ear normal.  Left Ear: External ear normal.  Nose: Nose normal.  Mouth/Throat: Oropharynx is clear and moist.  Eyes: Conjunctivae are normal. Pupils are equal, round, and reactive to light. Right eye exhibits no discharge. Left eye exhibits no discharge.  Neck: Normal range of motion. Neck supple. No JVD present. No tracheal deviation present. No thyromegaly present.  Cardiovascular: Normal rate, regular rhythm and normal heart  sounds.   Pulmonary/Chest: No stridor. No respiratory distress. She has no wheezes.  Abdominal: Soft. Bowel sounds are normal. She exhibits no distension and no mass. There is no tenderness. There is no rebound and no guarding.  Musculoskeletal: She exhibits no edema and no tenderness.  Lymphadenopathy:    She has no cervical adenopathy.  Neurological: She displays normal reflexes. No cranial nerve deficit. She exhibits normal muscle tone. Coordination abnormal.  head tremor ++ Hands tremor +   Skin: No rash noted. No erythema.  Psychiatric: She has a normal mood and affect. Her behavior is normal. Judgment and thought content normal.     Lab Results  Component Value Date   WBC 6.8 02/08/2014   HGB 13.4 02/08/2014   HCT 39.5 02/08/2014   PLT 220.0 02/08/2014   GLUCOSE 113* 02/08/2014   CHOL 172 02/08/2014   TRIG 259.0* 02/08/2014   HDL 45.70 02/08/2014   LDLDIRECT 130.4 11/02/2013   LDLCALC 75 02/08/2014   ALT 13 02/08/2014   AST 19 02/08/2014   NA 141 02/08/2014   K 3.6 02/08/2014   CL 105 02/08/2014   CREATININE 1.0 02/08/2014   BUN 19 02/08/2014   CO2 29 02/08/2014   TSH 1.84 02/08/2014   HGBA1C 6.6* 02/08/2014   MICROALBUR 6.3* 05/10/2013        Assessment & Plan:

## 2014-02-08 NOTE — Assessment & Plan Note (Signed)
Colonoscopy - Dec '11 Immunization  Tetanus Sept '09; pneumonia vaccine July '12; Shingles vaccine July '12  Here for medicare wellness/physical  Diet: heart healthy  Physical activity: sedentary  Depression/mood screen: negative  Hearing: intact to whispered voice  Visual acuity: grossly normal, performs annual eye exam  ADLs: capable  Fall risk: none  Home safety: good  Cognitive evaluation: intact to orientation, naming, recall and repetition  EOL planning: adv directives, full code/ I agree  I have personally reviewed and have noted  1. The patient's medical and social history  2. Their use of alcohol, tobacco or illicit drugs  3. Their current medications and supplements  4. The patient's functional ability including ADL's, fall risks, home safety risks and hearing or visual impairment.  5. Diet and physical activities  6. Evidence for depression or mood disorders    Today patient counseled on age appropriate routine health concerns for screening and prevention, each reviewed and up to date or declined. Immunizations reviewed and up to date or declined. Labs ordered and reviewed. Risk factors for depression reviewed and negative. Hearing function and visual acuity are intact. ADLs screened and addressed as needed. Functional ability and level of safety reviewed and appropriate. Education, counseling and referrals performed based on assessed risks today. Patient provided with a copy of personalized plan for preventive services.

## 2014-02-08 NOTE — Progress Notes (Signed)
Pre visit review using our clinic review tool, if applicable. No additional management support is needed unless otherwise documented below in the visit note. 

## 2014-02-08 NOTE — Assessment & Plan Note (Signed)
Continue with current prescription therapy as reflected on the Med list. Labs  

## 2014-02-08 NOTE — Assessment & Plan Note (Signed)
Continue with current prescription therapy as reflected on the Med list.  

## 2014-02-08 NOTE — Telephone Encounter (Signed)
Martie LeeSabrina, caregiver, phoned requesting info on newly prescribed gluten free diet.  Info was printed and mailed to patient.

## 2014-02-08 NOTE — Assessment & Plan Note (Signed)
Gluten free trial (no wheat products) for 4-6 weeks. OK to use gluten-free bread and gluten-free pasta.  Milk free trial (no milk, ice cream, cheese and yogurt) for 4-6 weeks. OK to use almond, coconut, rice or soy milk. "Almond breeze" brand tastes good.  

## 2014-02-08 NOTE — Assessment & Plan Note (Signed)
labs

## 2014-02-08 NOTE — Patient Instructions (Signed)
Gluten free trial (no wheat products) for 4-6 weeks. OK to use gluten-free bread and gluten-free pasta.  Milk free trial (no milk, ice cream, cheese and yogurt) for 4-6 weeks. OK to use almond, coconut, rice or soy milk. "Almond breeze" brand tastes good.  

## 2014-02-09 ENCOUNTER — Other Ambulatory Visit: Payer: Self-pay | Admitting: *Deleted

## 2014-02-09 ENCOUNTER — Encounter: Payer: Self-pay | Admitting: Internal Medicine

## 2014-02-09 MED ORDER — CIPROFLOXACIN HCL 250 MG PO TABS: 250.0000 mg | ORAL_TABLET | Freq: Two times a day (BID) | ORAL | Status: DC

## 2014-02-12 ENCOUNTER — Telehealth: Payer: Self-pay | Admitting: Internal Medicine

## 2014-02-12 ENCOUNTER — Ambulatory Visit: Payer: 59

## 2014-02-12 NOTE — Telephone Encounter (Signed)
Pt called to requerst newly prescribed gluten free diet to be mail. Please help

## 2014-02-12 NOTE — Telephone Encounter (Signed)
Information mailed to pt.

## 2014-02-13 ENCOUNTER — Ambulatory Visit (INDEPENDENT_AMBULATORY_CARE_PROVIDER_SITE_OTHER): Payer: Medicare Other | Admitting: Pharmacist Clinician (PhC)/ Clinical Pharmacy Specialist

## 2014-02-13 DIAGNOSIS — E785 Hyperlipidemia, unspecified: Secondary | ICD-10-CM

## 2014-02-13 NOTE — Patient Instructions (Signed)
1.  Increase fish oil to 4,000 mg (4 capsules) daily.  Can take 2 tables twice daily or all 4 at same time 2.  Continue pravastatin 20mg  once daily in the evenings 3.  Continue zetia 10mg  once daily 4.  Try to limit red meats to no more than once weekly, and pork less often 5.  Recheck cholesterol in 3 months and follow up with Riki RuskJeremy

## 2014-02-14 ENCOUNTER — Ambulatory Visit: Payer: Medicare Other

## 2014-02-14 NOTE — Progress Notes (Signed)
Patient referred to lipid clinic by Dr. Debby Bud due to elevated LDL and non-HDL in patient with h/o diabetes and has failed lipid lowering therapies in the past.  Patient currently taking Zetia 10 mg qd and over the counter fish oil 2 gm/day.  She tried simvastatin in the past, and she thinks she tried lipitor in the past, and both stopped due to urticaria.  She was also tried on Welchol, and this was also stopped but patient doesn't recall why.  I can't tell from the notes why Welchol was stopped either.  When I mentioned constipation, patient states that this was likely the reason Welchol was stopped.  Patient does have some mild underlying dementia.  She is here today with 2 family members  Risk Factors:  Diabetes, HTN, family h/o CAD, age, low HDL - LDL goal < 70, non-HDL goal < 100 Meds:  Zetia 10 mg qd, fish oil 2 g daily. Intolerant:  Simvastatin and atorvastatin (urticaria per patient), Welchol taken in past (not sure why stopped).  Patient is working with Dr. Debby Bud to get glucose under better control.  She has become much better at eating regular meals without skipping.  Her blood sugars are under better control and she reports no hypoglycemia in the past month.  Social history:  Former smoker (quit over 10 years ago).  Doesn't drink alcohol. Family history:  CAD - mother.  Diabetes - mother and father. Exercise:  Previous R foot fracture has healed well and she is currently walking 15-20 minutes per day with a goal to get to 30 minutes per day.  Diet:  She was put on a gluten free diet in the past week.  Apparently she has had GI problems, including chronic diarrhea, in the past and it was recommended that she go gluten free.  She has eliminated breads and other "white" foods over the past week and states her GI problems are easing.  The family is still working out what types of foods she can have, but they seem committed to making this work for her.  She continues to eat beef about once per  week, otherwise chicken and fish are the main proteins in her diet.  She has increased the amount of vegetables in her diet, including salads, and I reminded them that salad dressings are often very high in fats.    Labs:  3/15 - TC 172, TG 259, HDL 45.7, LDL 75 (Zetia 10 mg qd, fish oil 2 g/d) A1C - 6.3, TSH - WNL  Current Outpatient Prescriptions  Medication Sig Dispense Refill  . azelastine (ASTELIN) 137 MCG/SPRAY nasal spray 1-2 puffs each nostril, once or twice daily when needed  30 mL  prn  . budesonide-formoterol (SYMBICORT) 80-4.5 MCG/ACT inhaler Inhale 2 puffs into the lungs 2 (two) times daily.      . cholestyramine light (PREVALITE) 4 G packet Take 1 packet (4 g total) by mouth 2 (two) times daily.  60 packet  3  . ciprofloxacin (CIPRO) 250 MG tablet Take 1 tablet (250 mg total) by mouth 2 (two) times daily.  10 tablet  0  . clonazePAM (KLONOPIN) 1 MG tablet Take 1-2 tablets (1-2 mg total) by mouth at bedtime as needed for anxiety.  60 tablet  5  . donepezil (ARICEPT) 10 MG tablet Take 1 tablet (10 mg total) by mouth at bedtime.  30 tablet  12  . ezetimibe (ZETIA) 10 MG tablet Take 10 mg by mouth daily.      Marland Kitchen  fish oil-omega-3 fatty acids 1000 MG capsule Take 2,000 mg by mouth daily.       . furosemide (LASIX) 40 MG tablet Take 40 mg by mouth daily.      Marland Kitchen. glucose blood test strip Use as instructed ACCUCHECK AVIVA TEST STRIPS , USE AS DIRECTED ONE TIME A DAY,  DIAGNOSIS CODE 250.0.  100 each  6  . HYDROcodone-acetaminophen (NORCO/VICODIN) 5-325 MG per tablet Take 1 tablet by mouth every 8 (eight) hours as needed. FILL ON OR AFTER 03/02/2014  90 tablet  0  . insulin glargine (LANTUS) 100 UNIT/ML injection sliding scale Pt uses between 20-30 units daily      . levalbuterol (XOPENEX) 0.63 MG/3ML nebulizer solution Take 1 ampule by nebulization every 6 (six) hours as needed. For wheezing      . levothyroxine (SYNTHROID) 88 MCG tablet Take 1 tablet (88 mcg total) by mouth daily.  90 tablet   3  . Memantine HCl ER 28 MG CP24 Take 28 mg by mouth daily.  90 capsule  3  . metFORMIN (GLUCOPHAGE) 1000 MG tablet TAKE 1 TABLET BY MOUTH TWICE DAILY WITH A MEAL  180 tablet  3  . Multiple Vitamins-Minerals (CENTRUM SILVER PO) Take 1 tablet by mouth daily.      . permethrin (ELIMITE) 5 % cream APPLY TOPICALLY ONCE AS DIRECTED  60 g  1  . pravastatin (PRAVACHOL) 20 MG tablet Take 1 tablet (20 mg total) by mouth every evening.  30 tablet  11  . PREMARIN vaginal cream APPLY VAGINALLY DAILY FOR 10 DAYS, THEN TWICE A WEEK  30 g  0  . SPIRIVA HANDIHALER 18 MCG inhalation capsule PLACE 1 CAPSULE INTO INHALER AND INHALE DAILY.  30 capsule  5  . valsartan (DIOVAN) 160 MG tablet Take 160 mg by mouth daily.      . vitamin A 8000 UNIT capsule Take 8,000 Units by mouth daily.      . [DISCONTINUED] Calcium Carbonate-Vitamin D (CALCIUM 600/VITAMIN D) 600-400 MG-UNIT per tablet Take 1 tablet by mouth 2 (two) times daily.        . [DISCONTINUED] sodium chloride (OCEAN) 0.65 % nasal spray Place 2 sprays into the nose as needed. TO OPEN NASAL BREATHING       No current facility-administered medications for this visit.   Allergies  Allergen Reactions  . Aspirin Itching  . Codeine Itching  . Demerol Itching    Unknown-unconscious.  . Latex Itching  . Lisinopril Itching and Cough    REACTION: causes cough    Given patient's h/o diabetes, would like to see LDL at least < 100 mg/dL, and preferably < 70 mg/dL.Labs showed LDL at 75,however this number is probably truly higher due to the elevated triglycerides.  Patient had LDL of 68 mg/dL in past on simvastatin + Zetia. Given she failed simvastatin and atorvastatin due to urticaria, will try a different statin today. Want to make sure we don't use something that may worsen dementia as well. Her dietary changes with the gluten free diet should hopefully help to decrease the triglycerides.  Her family seems to be working quite dilligently to get her on a healthy diet  for her.  For now I will leave her on the low dose pravastatin (20 mg daily), but increase fish oil to 4 g/d. Fish oil will hopefully lower non-HDL and TG further, and also may help with memory. She has become more compliant with medications since her last visit and the family states that  she has no problems with swallowing pills. Will recheck cholesterol / liver in 3 months, then see me 1 day later.   Plan: 1.  Increase fish oil to 4,000 mg (4 capsules) daily.  Can take 2 tables twice daily or all 4 at same time 2.  Continue pravastatin 20mg  once daily in the evenings 3.  Continue zetia 10mg  once daily 4.  Try to limit red meats to no more than once weekly, and pork less often 5.  Recheck cholesterol in 3 months and follow up with Riki Rusk

## 2014-02-17 ENCOUNTER — Other Ambulatory Visit: Payer: Self-pay | Admitting: Internal Medicine

## 2014-02-23 ENCOUNTER — Encounter: Payer: Self-pay | Admitting: Internal Medicine

## 2014-03-05 ENCOUNTER — Ambulatory Visit (INDEPENDENT_AMBULATORY_CARE_PROVIDER_SITE_OTHER): Payer: Medicare Other

## 2014-03-05 ENCOUNTER — Other Ambulatory Visit: Payer: Self-pay | Admitting: Internal Medicine

## 2014-03-05 DIAGNOSIS — J309 Allergic rhinitis, unspecified: Secondary | ICD-10-CM

## 2014-03-07 ENCOUNTER — Other Ambulatory Visit: Payer: Self-pay | Admitting: Internal Medicine

## 2014-03-07 NOTE — Telephone Encounter (Signed)
Pt called again very upset that cipro has not been called in.  Explained she may need an appt for more cipro and that we are waiting for an answer.

## 2014-03-13 ENCOUNTER — Telehealth: Payer: Self-pay | Admitting: Internal Medicine

## 2014-03-13 DIAGNOSIS — M8580 Other specified disorders of bone density and structure, unspecified site: Secondary | ICD-10-CM

## 2014-03-13 NOTE — Telephone Encounter (Signed)
Pt's insurance wants her to have a bone density.  Her last one was 2-3 years ago.

## 2014-03-16 NOTE — Telephone Encounter (Signed)
Ok Will sch Thx

## 2014-03-16 NOTE — Telephone Encounter (Signed)
Pt states that a home health or someone from her insurance came to her house to do the bone density.  She wasn't sure which agency, but said they scanned her bones.

## 2014-03-19 ENCOUNTER — Ambulatory Visit (INDEPENDENT_AMBULATORY_CARE_PROVIDER_SITE_OTHER): Payer: Medicare Other

## 2014-03-19 DIAGNOSIS — J309 Allergic rhinitis, unspecified: Secondary | ICD-10-CM

## 2014-03-27 ENCOUNTER — Telehealth: Payer: Self-pay | Admitting: Internal Medicine

## 2014-03-27 NOTE — Telephone Encounter (Signed)
Pt wants to know if we received bone density results.  She says they came to her home, but didn't identify who they worked for.  Did we get results?  She wants the results.

## 2014-03-28 NOTE — Telephone Encounter (Signed)
Called pt and informed her that we did not have the BDS done at her home and that the only order we have in EPIC is the canceled BDS.

## 2014-03-28 NOTE — Telephone Encounter (Signed)
I only can see a cancelled BDS order in Epic Thx

## 2014-03-30 ENCOUNTER — Other Ambulatory Visit: Payer: Self-pay

## 2014-03-30 ENCOUNTER — Ambulatory Visit: Payer: Medicare Other

## 2014-03-30 MED ORDER — VALSARTAN 160 MG PO TABS: 160.0000 mg | ORAL_TABLET | Freq: Every day | ORAL | Status: DC

## 2014-03-30 NOTE — Telephone Encounter (Signed)
RX request for valsartsn

## 2014-04-02 ENCOUNTER — Telehealth: Payer: Self-pay | Admitting: *Deleted

## 2014-04-02 ENCOUNTER — Ambulatory Visit (INDEPENDENT_AMBULATORY_CARE_PROVIDER_SITE_OTHER): Payer: Medicare Other

## 2014-04-02 ENCOUNTER — Other Ambulatory Visit: Payer: Self-pay | Admitting: Internal Medicine

## 2014-04-02 DIAGNOSIS — J309 Allergic rhinitis, unspecified: Secondary | ICD-10-CM

## 2014-04-02 NOTE — Telephone Encounter (Signed)
[facility] called requesting Hydrocodone refill for pt.  Please advise

## 2014-04-02 NOTE — Telephone Encounter (Signed)
OK to fill this prescription with additional refills x0 OV q 3 mo on Norco pls Thank you!

## 2014-04-03 ENCOUNTER — Telehealth: Payer: Self-pay | Admitting: Internal Medicine

## 2014-04-03 MED ORDER — AMOXICILLIN-POT CLAVULANATE 875-125 MG PO TABS: 1.0000 | ORAL_TABLET | Freq: Two times a day (BID) | ORAL | Status: DC

## 2014-04-03 MED ORDER — HYDROCODONE-ACETAMINOPHEN 5-325 MG PO TABS: 1.0000 | ORAL_TABLET | Freq: Three times a day (TID) | ORAL | Status: DC | PRN

## 2014-04-03 NOTE — Telephone Encounter (Signed)
Pt c/o increased cough with yellow mucus production, sinus pressure / HA, runny nose and sore throat x 2 days. Pt reports that she does not have transportation to come in for an appt today [facility] Upcoming appt 05/10/14 with CDY Allergies  Allergen Reactions  . Aspirin Itching  . Codeine Itching  . Demerol Itching    Unknown-unconscious.  . Latex Itching  . Lisinopril Itching and Cough    REACTION: causes cough   Please advise Dr Maple HudsonYoung. Thanks.    Medication List       This list is accurate as of: 04/03/14  9:28 AM.  Always use your most recent med list.               azelastine 0.1 % nasal spray  Commonly known as:  ASTELIN  1-2 puffs each nostril, once or twice daily when needed     budesonide-formoterol 80-4.5 MCG/ACT inhaler  Commonly known as:  SYMBICORT  Inhale 2 puffs into the lungs 2 (two) times daily.     CENTRUM SILVER PO  Take 1 tablet by mouth daily.     cholestyramine light 4 G packet  Commonly known as:  PREVALITE  Take 1 packet (4 g total) by mouth 2 (two) times daily.     clonazePAM 1 MG tablet  Commonly known as:  KLONOPIN  Take 1-2 tablets (1-2 mg total) by mouth at bedtime as needed for anxiety.     donepezil 10 MG tablet  Commonly known as:  ARICEPT  Take 1 tablet (10 mg total) by mouth at bedtime.     ezetimibe 10 MG tablet  Commonly known as:  ZETIA  Take 10 mg by mouth daily.     fish oil-omega-3 fatty acids 1000 MG capsule  Take 2,000 mg by mouth daily.     furosemide 40 MG tablet  Commonly known as:  LASIX  Take 40 mg by mouth daily.     glucose blood test strip  Use as instructed ACCUCHECK AVIVA TEST STRIPS , USE AS DIRECTED ONE TIME A DAY,  DIAGNOSIS CODE 250.0.     HYDROcodone-acetaminophen 5-325 MG per tablet  Commonly known as:  NORCO/VICODIN  Take 1 tablet by mouth every 8 (eight) hours as needed. FILL ON OR AFTER 03/02/2014     LANTUS 100 UNIT/ML injection  Generic drug:   insulin glargine  sliding scale Pt uses between 20-30 units daily     levalbuterol 0.63 MG/3ML nebulizer solution  Commonly known as:  XOPENEX  Take 1 ampule by nebulization every 6 (six) hours as needed. For wheezing     levothyroxine 88 MCG tablet  Commonly known as:  SYNTHROID  Take 1 tablet (88 mcg total) by mouth daily.     Memantine HCl ER 28 MG Cp24  Take 28 mg by mouth daily.     metFORMIN 1000 MG tablet  Commonly known as:  GLUCOPHAGE  TAKE 1 TABLET BY MOUTH TWICE DAILY WITH A MEAL     NEXIUM 40 MG capsule  Generic drug:  esomeprazole  TAKE ONE CAPSULE BY MOUTH DAILY     permethrin 5 % cream  Commonly known as:  ELIMITE  APPLY TOPICALLY ONCE AS DIRECTED     pravastatin 20 MG tablet  Commonly known as:  PRAVACHOL  Take 1 tablet (20 mg total) by mouth every evening.     PREMARIN vaginal cream  Generic drug:  conjugated estrogens  APPLY VAGINALLY DAILY FOR 10  DAYS, THEN TWICE A WEEK     SPIRIVA HANDIHALER 18 MCG inhalation capsule  Generic drug:  tiotropium  PLACE 1 CAPSULE INTO INHALER AND INHALE DAILY.     valsartan 160 MG tablet  Commonly known as:  DIOVAN  Take 1 tablet (160 mg total) by mouth daily.     vitamin A 8000 UNIT capsule  Take 8,000 Units by mouth daily.

## 2014-04-03 NOTE — Telephone Encounter (Signed)
ATC pt x2 - line busy.  WCB. 

## 2014-04-03 NOTE — Telephone Encounter (Signed)
Spoke with pt advised Rx ready for pick up 

## 2014-04-03 NOTE — Telephone Encounter (Signed)
Augmentin 875 Take 1 po BID #[id number] [facility] Pt aware.  Nothing further needed.

## 2014-04-03 NOTE — Telephone Encounter (Signed)
Pt called back stating she has a fever & will need to have something called in to help.  Pt requests augmentin.  Antionette FairyHolly D Pryor

## 2014-04-03 NOTE — Telephone Encounter (Signed)
Ok augmentin 875 mg, # 14, 1 twice daily 

## 2014-04-03 NOTE — Telephone Encounter (Signed)
Pt returned call

## 2014-04-04 ENCOUNTER — Other Ambulatory Visit: Payer: Self-pay | Admitting: *Deleted

## 2014-04-04 MED ORDER — LEVOTHYROXINE SODIUM 88 MCG PO TABS: 88.0000 ug | ORAL_TABLET | Freq: Every day | ORAL | Status: DC

## 2014-04-06 ENCOUNTER — Encounter (HOSPITAL_COMMUNITY): Payer: Self-pay | Admitting: Emergency Medicine

## 2014-04-06 ENCOUNTER — Emergency Department (HOSPITAL_COMMUNITY): Payer: Medicare Other

## 2014-04-06 ENCOUNTER — Telehealth: Payer: Self-pay | Admitting: Internal Medicine

## 2014-04-06 ENCOUNTER — Emergency Department (HOSPITAL_COMMUNITY)
Admission: EM | Admit: 2014-04-06 | Discharge: 2014-04-06 | Disposition: A | Discharge status: Home / Self Care | Payer: Medicare Other | Attending: Emergency Medicine | Admitting: Emergency Medicine

## 2014-04-06 DIAGNOSIS — Z860100 Personal history of colon polyps, unspecified: Secondary | ICD-10-CM | POA: Insufficient documentation

## 2014-04-06 DIAGNOSIS — Z87891 Personal history of nicotine dependence: Secondary | ICD-10-CM | POA: Insufficient documentation

## 2014-04-06 DIAGNOSIS — Z91040 Latex allergy status: Secondary | ICD-10-CM | POA: Insufficient documentation

## 2014-04-06 DIAGNOSIS — E039 Hypothyroidism, unspecified: Secondary | ICD-10-CM | POA: Insufficient documentation

## 2014-04-06 DIAGNOSIS — Z794 Long term (current) use of insulin: Secondary | ICD-10-CM | POA: Insufficient documentation

## 2014-04-06 DIAGNOSIS — R231 Pallor: Secondary | ICD-10-CM | POA: Insufficient documentation

## 2014-04-06 DIAGNOSIS — E119 Type 2 diabetes mellitus without complications: Secondary | ICD-10-CM | POA: Insufficient documentation

## 2014-04-06 DIAGNOSIS — R05 Cough: Secondary | ICD-10-CM

## 2014-04-06 DIAGNOSIS — Z862 Personal history of diseases of the blood and blood-forming organs and certain disorders involving the immune mechanism: Secondary | ICD-10-CM | POA: Insufficient documentation

## 2014-04-06 DIAGNOSIS — F411 Generalized anxiety disorder: Secondary | ICD-10-CM | POA: Insufficient documentation

## 2014-04-06 DIAGNOSIS — J441 Chronic obstructive pulmonary disease with (acute) exacerbation: Secondary | ICD-10-CM | POA: Insufficient documentation

## 2014-04-06 DIAGNOSIS — J45901 Unspecified asthma with (acute) exacerbation: Principal | ICD-10-CM

## 2014-04-06 DIAGNOSIS — K219 Gastro-esophageal reflux disease without esophagitis: Secondary | ICD-10-CM | POA: Insufficient documentation

## 2014-04-06 DIAGNOSIS — F3289 Other specified depressive episodes: Secondary | ICD-10-CM | POA: Insufficient documentation

## 2014-04-06 DIAGNOSIS — F329 Major depressive disorder, single episode, unspecified: Secondary | ICD-10-CM | POA: Insufficient documentation

## 2014-04-06 DIAGNOSIS — R42 Dizziness and giddiness: Secondary | ICD-10-CM | POA: Insufficient documentation

## 2014-04-06 DIAGNOSIS — R059 Cough, unspecified: Secondary | ICD-10-CM

## 2014-04-06 DIAGNOSIS — Z79899 Other long term (current) drug therapy: Secondary | ICD-10-CM | POA: Insufficient documentation

## 2014-04-06 DIAGNOSIS — N951 Menopausal and female climacteric states: Secondary | ICD-10-CM

## 2014-04-06 DIAGNOSIS — I10 Essential (primary) hypertension: Secondary | ICD-10-CM | POA: Insufficient documentation

## 2014-04-06 DIAGNOSIS — Z8739 Personal history of other diseases of the musculoskeletal system and connective tissue: Secondary | ICD-10-CM | POA: Insufficient documentation

## 2014-04-06 DIAGNOSIS — Z792 Long term (current) use of antibiotics: Secondary | ICD-10-CM | POA: Insufficient documentation

## 2014-04-06 NOTE — ED Provider Notes (Signed)
Medical screening examination/treatment/procedure(s) were performed by non-physician practitioner and as supervising physician I was immediately available for consultation/collaboration.    Vida RollerBrian D Ibraham Levi, MD 04/06/14 [id number]

## 2014-04-06 NOTE — Telephone Encounter (Signed)
Patient states that she wants to go have a bone density on the same day has her OV with Dr. Posey ReaPlotnikov on 6/12. She needs an order for this put in the system.

## 2014-04-06 NOTE — Discharge Instructions (Signed)
Continue using your rescue inhaler as needed  Call Dr. Maple HudsonYoung to set an earlier appointment

## 2014-04-06 NOTE — ED Notes (Signed)
Pt presents with "feeling really bad" today, she is taking Augmentin which her PCP prescribed Tuesday for nasal congestion and productive cough with green/yellow mucous x1 week. Pt also reports wheezing.

## 2014-04-06 NOTE — Telephone Encounter (Signed)
Ok Thx 

## 2014-04-06 NOTE — ED Provider Notes (Signed)
CSN: [id number]     Arrival date & time 04/06/14  1540 History   First MD Initiated Contact with Patient 04/06/14 1955     Chief Complaint  Patient presents with  . Cough     (Consider location/radiation/quality/duration/timing/severity/associated sxs/prior Treatment) HPI Comments: Patient with COPD currently being treated with Augmentin for sinus infection  Presents with cough that resolved with use of her Xoponex PRN. Denies fever N/V/D, abdominal pain   Patient is a 68 y.o. female presenting with cough. The history is provided by the patient.  Cough Cough characteristics:  Non-productive Severity:  Moderate Onset quality:  Gradual Duration:  5 days Timing:  Intermittent Progression:  Unchanged Smoker: no   Context comment:  Sinus infection  Relieved by:  Beta-agonist inhaler Worsened by:  Activity Associated symptoms: sinus congestion and wheezing   Associated symptoms: no chills, no fever and no rash     Past Medical History  Diagnosis Date  . Personal history of colonic polyps   . Iron deficiency anemia, unspecified   . Other malaise and fatigue   . Chest pain, unspecified   . Other B-complex deficiencies   . Arthritis     Right Sacroiliac joint  . Other and unspecified hyperlipidemia   . Anxiety and depression   . HTN (hypertension)   . Unspecified hypothyroidism   . Type II or unspecified type diabetes mellitus without mention of complication, not stated as uncontrolled   . Allergic rhinitis, cause unspecified   . Unspecified asthma(493.90)   . Chronic airway obstruction, not elsewhere classified   . Abnormal stress test 08/21/2008    Neg  . GERD (gastroesophageal reflux disease)    Past Surgical History  Procedure Laterality Date  . Foot surgery      hammer toe 2004, 2005  . Abdominal hysterectomy  1982    menometorrhagia  . Carpal tunnel release  03/05/2009    bilateral  . Colonoscopy N/A 09/06/2013    Procedure: COLONOSCOPY;  Surgeon: Louis Meckel, MD;  Location: [facility] ENDOSCOPY;  Service: Endoscopy;  Laterality: N/A;  . Hot hemostasis N/A 09/06/2013    Procedure: HOT HEMOSTASIS (ARGON PLASMA COAGULATION/BICAP);  Surgeon: Louis Meckel, MD;  Location: [facility];  Service: Endoscopy;  Laterality: N/A;   Family History  Problem Relation Age of Onset  . Hypertension Mother   . Diabetes Mother   . Heart disease Mother     CHF  . Coronary artery disease Mother   . Lung cancer Father   . Hypertension Father   . Diabetes Father   . Liver cancer Father     w/mets  . Prostate cancer Father   . Breast cancer Sister   . Lung disease Brother     Agent Orange Related  . Asthma Brother   . Allergies Brother   . Colon cancer Neg Hx    History  Substance Use Topics  . Smoking status: Former Smoker    Quit date: 06/01/1997  . Smokeless tobacco: Never Used     Comment: Quit 9 years ago.  . Alcohol Use: No   OB History   Grav Para Term Preterm Abortions TAB SAB Ect Mult Living                 Review of Systems  Constitutional: Negative for fever and chills.  Respiratory: Positive for cough and wheezing.   Gastrointestinal: Negative for nausea, vomiting and diarrhea.  Genitourinary: Negative for dysuria.  Skin: Negative for rash.  Neurological: Positive  for dizziness.  All other systems reviewed and are negative.     Allergies  Aspirin; Codeine; Demerol; Latex; and Lisinopril  Home Medications   Prior to Admission medications   Medication Sig Start Date End Date Taking? Authorizing Provider  amoxicillin-clavulanate (AUGMENTIN) 875-125 MG per tablet Take 1 tablet by mouth 2 (two) times daily. 04/03/14  Yes Waymon Budgelinton D Young, MD  azelastine (ASTELIN) 137 MCG/SPRAY nasal spray 1-2 puffs each nostril, once or twice daily when needed 11/28/13  Yes Waymon Budgelinton D Young, MD  budesonide-formoterol (SYMBICORT) 80-4.5 MCG/ACT inhaler Inhale 2 puffs into the lungs 2 (two) times daily.   Yes Historical Provider, MD  cholestyramine  light (PREVALITE) 4 G packet Take 1 packet (4 g total) by mouth 2 (two) times daily. 12/08/13  Yes Louis Meckelobert D Kaplan, MD  clonazePAM (KLONOPIN) 1 MG tablet Take 1-2 tablets (1-2 mg total) by mouth at bedtime as needed for anxiety. 02/08/14  Yes Tresa GarterAleksei V Plotnikov, MD  conjugated estrogens (PREMARIN) vaginal cream Place 1 Applicatorful vaginally 2 (two) times daily.   Yes Historical Provider, MD  esomeprazole (NEXIUM) 40 MG capsule Take 40 mg by mouth daily at 12 noon.   Yes Historical Provider, MD  ezetimibe (ZETIA) 10 MG tablet Take 10 mg by mouth daily.   Yes Historical Provider, MD  fish oil-omega-3 fatty acids 1000 MG capsule Take 2,000 mg by mouth daily.    Yes Historical Provider, MD  furosemide (LASIX) 40 MG tablet Take 40 mg by mouth daily.   Yes Historical Provider, MD  HYDROcodone-acetaminophen (NORCO/VICODIN) 5-325 MG per tablet Take 1 tablet by mouth every 8 (eight) hours as needed. FILL ON OR AFTER 03/02/2014 04/03/14  Yes Georgina QuintAleksei V Plotnikov, MD  insulin glargine (LANTUS) 100 UNIT/ML injection sliding scale Pt uses between 20-30 units daily 11/17/13  Yes Jacques NavyMichael E Norins, MD  levalbuterol Pauline Aus(XOPENEX) 0.63 MG/3ML nebulizer solution Take 1 ampule by nebulization every 6 (six) hours as needed. For wheezing 11/28/12  Yes Waymon Budgelinton D Young, MD  levothyroxine (SYNTHROID) 88 MCG tablet Take 1 tablet (88 mcg total) by mouth daily. 04/04/14 04/04/15 Yes Georgina QuintAleksei V Plotnikov, MD  Memantine HCl ER 28 MG CP24 Take 28 mg by mouth daily. 11/17/13  Yes Levert FeinsteinYijun Yan, MD  metFORMIN (GLUCOPHAGE) 1000 MG tablet Take 1,000 mg by mouth 2 (two) times daily with a meal.   Yes Historical Provider, MD  Multiple Vitamins-Minerals (CENTRUM SILVER PO) Take 1 tablet by mouth daily.   Yes Historical Provider, MD  pravastatin (PRAVACHOL) 20 MG tablet Take 1 tablet (20 mg total) by mouth every evening. 11/20/13  Yes Jacques NavyMichael E Norins, MD  tiotropium (SPIRIVA) 18 MCG inhalation capsule Place 18 mcg into inhaler and inhale daily.   Yes  Historical Provider, MD  valsartan (DIOVAN) 160 MG tablet Take 1 tablet (160 mg total) by mouth daily. 03/30/14  Yes Georgina QuintAleksei V Plotnikov, MD  vitamin A 8000 UNIT capsule Take 8,000 Units by mouth daily.   Yes Historical Provider, MD  donepezil (ARICEPT) 10 MG tablet Take 1 tablet (10 mg total) by mouth at bedtime. 11/17/13   Levert FeinsteinYijun Yan, MD   BP 133/53  Pulse 61  Temp(Src) 98.1 F (36.7 C) (Oral)  Resp 18  Ht 5\' 3"  (1.6 m)  Wt 208 lb (94.348 kg)  BMI 36.85 kg/m2  SpO2 99% Physical Exam  Nursing note and vitals reviewed. Constitutional: She appears well-developed and well-nourished.  HENT:  Head: Normocephalic.  Eyes: Pupils are equal, round, and reactive to light.  Neck: Normal  range of motion.  Cardiovascular: Normal rate.   Pulmonary/Chest: Effort normal. No respiratory distress. She has no wheezes.  Abdominal: Soft. She exhibits no distension. There is no tenderness.  Musculoskeletal: Normal range of motion.  Neurological: She is alert.  Skin: There is pallor.    ED Course  Procedures (including critical care time) Labs Review Labs Reviewed - No data to display  Imaging Review Dg Chest 2 View  04/06/2014   CLINICAL DATA:  Productive cough for 1 week, history of COPD  EXAM: CHEST  2 VIEW  COMPARISON:  DG CHEST 2 VIEW dated 07/20/2013  FINDINGS: The heart size and vascular pattern are normal. There is calcification of the aortic arch. There is no consolidation or effusion.  IMPRESSION: No active cardiopulmonary disease.   Electronically Signed   By: Esperanza Heiraymond  Rubner M.D.   On: 04/06/2014 17:46     EKG Interpretation None      MDM  X-Ray reviewed  Patient has been encouraged to use Xoponex as needed  If the cough gets worse to return, to make an appointment with Dr. Maple HudsonYoung. Final diagnoses:  Cough        Arman FilterGail K Mayford Alberg, NP 04/06/14 2035  Arman FilterGail K Abriel Geesey, NP 04/06/14 2041

## 2014-04-09 ENCOUNTER — Telehealth: Payer: Self-pay | Admitting: Internal Medicine

## 2014-04-09 ENCOUNTER — Ambulatory Visit: Payer: Medicare Other

## 2014-04-09 MED ORDER — DOXYCYCLINE HYCLATE 100 MG PO CAPS: ORAL_CAPSULE | ORAL | Status: DC

## 2014-04-09 NOTE — Telephone Encounter (Signed)
Called spoke with pt. She c/o nasal cong, prod cough-yellow phlem, wheezing, chest tx, PND. Was seen in ED on Saturday (in epic). Did not give any meds to her. She finished a round of ABX already and still feels bad. Please advise Dr. Maple HudsonYoung thanks  Allergies  Allergen Reactions  . Aspirin Itching  . Codeine Itching  . Demerol Itching    Unknown-unconscious.  . Latex Itching  . Lisinopril Itching and Cough    REACTION: causes cough     Current Outpatient Prescriptions on File Prior to Visit  Medication Sig Dispense Refill  . amoxicillin-clavulanate (AUGMENTIN) 875-125 MG per tablet Take 1 tablet by mouth 2 (two) times daily.  14 tablet  0  . azelastine (ASTELIN) 137 MCG/SPRAY nasal spray 1-2 puffs each nostril, once or twice daily when needed  30 mL  prn  . budesonide-formoterol (SYMBICORT) 80-4.5 MCG/ACT inhaler Inhale 2 puffs into the lungs 2 (two) times daily.      . cholestyramine light (PREVALITE) 4 G packet Take 1 packet (4 g total) by mouth 2 (two) times daily.  60 packet  3  . clonazePAM (KLONOPIN) 1 MG tablet Take 1-2 tablets (1-2 mg total) by mouth at bedtime as needed for anxiety.  60 tablet  5  . conjugated estrogens (PREMARIN) vaginal cream Place 1 Applicatorful vaginally 2 (two) times daily.      Marland Kitchen. donepezil (ARICEPT) 10 MG tablet Take 1 tablet (10 mg total) by mouth at bedtime.  30 tablet  12  . esomeprazole (NEXIUM) 40 MG capsule Take 40 mg by mouth daily at 12 noon.      . ezetimibe (ZETIA) 10 MG tablet Take 10 mg by mouth daily.      . fish oil-omega-3 fatty acids 1000 MG capsule Take 2,000 mg by mouth daily.       . furosemide (LASIX) 40 MG tablet Take 40 mg by mouth daily.      Marland Kitchen. HYDROcodone-acetaminophen (NORCO/VICODIN) 5-325 MG per tablet Take 1 tablet by mouth every 8 (eight) hours as needed. FILL ON OR AFTER 03/02/2014  90 tablet  0  . insulin glargine (LANTUS) 100 UNIT/ML injection sliding scale Pt uses between 20-30 units daily      . levalbuterol (XOPENEX) 0.63  MG/3ML nebulizer solution Take 1 ampule by nebulization every 6 (six) hours as needed. For wheezing      . levothyroxine (SYNTHROID) 88 MCG tablet Take 1 tablet (88 mcg total) by mouth daily.  90 tablet  3  . Memantine HCl ER 28 MG CP24 Take 28 mg by mouth daily.  90 capsule  3  . metFORMIN (GLUCOPHAGE) 1000 MG tablet Take 1,000 mg by mouth 2 (two) times daily with a meal.      . Multiple Vitamins-Minerals (CENTRUM SILVER PO) Take 1 tablet by mouth daily.      . pravastatin (PRAVACHOL) 20 MG tablet Take 1 tablet (20 mg total) by mouth every evening.  30 tablet  11  . tiotropium (SPIRIVA) 18 MCG inhalation capsule Place 18 mcg into inhaler and inhale daily.      . valsartan (DIOVAN) 160 MG tablet Take 1 tablet (160 mg total) by mouth daily.  30 tablet  5  . vitamin A 8000 UNIT capsule Take 8,000 Units by mouth daily.      . [DISCONTINUED] Calcium Carbonate-Vitamin D (CALCIUM 600/VITAMIN D) 600-400 MG-UNIT per tablet Take 1 tablet by mouth 2 (two) times daily.        . [DISCONTINUED] sodium chloride (  OCEAN) 0.65 % nasal spray Place 2 sprays into the nose as needed. TO OPEN NASAL BREATHING       No current facility-administered medications on file prior to visit.

## 2014-04-09 NOTE — Telephone Encounter (Signed)
Pt returned call

## 2014-04-09 NOTE — Telephone Encounter (Signed)
lmomtcb x1 

## 2014-04-09 NOTE — Telephone Encounter (Signed)
Offer doxycycline 100 mg, # 8, 2 today then one daily 

## 2014-04-09 NOTE — Telephone Encounter (Signed)
Spoke with [name] and notified of recs per CDY  She verbalized understanding  Rx was sent to pharm  She will call back if not improving

## 2014-04-10 ENCOUNTER — Telehealth: Payer: Self-pay | Admitting: Pulmonary Disease

## 2014-04-10 MED ORDER — ALBUTEROL SULFATE HFA 108 (90 BASE) MCG/ACT IN AERS: 2.0000 | INHALATION_SPRAY | Freq: Four times a day (QID) | RESPIRATORY_TRACT | Status: DC | PRN

## 2014-04-10 NOTE — Telephone Encounter (Signed)
Called spoke with pt. She is requesting to get an RX for proair to have on hand. This is not on current med list. Please advise if okay Dr. Maple HudsonYoung thanks  Allergies  Allergen Reactions  . Aspirin Itching  . Codeine Itching  . Demerol Itching    Unknown-unconscious.  . Latex Itching  . Lisinopril Itching and Cough    REACTION: causes cough     Current Outpatient Prescriptions on File Prior to Visit  Medication Sig Dispense Refill  . amoxicillin-clavulanate (AUGMENTIN) 875-125 MG per tablet Take 1 tablet by mouth 2 (two) times daily.  14 tablet  0  . azelastine (ASTELIN) 137 MCG/SPRAY nasal spray 1-2 puffs each nostril, once or twice daily when needed  30 mL  prn  . budesonide-formoterol (SYMBICORT) 80-4.5 MCG/ACT inhaler Inhale 2 puffs into the lungs 2 (two) times daily.      . cholestyramine light (PREVALITE) 4 G packet Take 1 packet (4 g total) by mouth 2 (two) times daily.  60 packet  3  . clonazePAM (KLONOPIN) 1 MG tablet Take 1-2 tablets (1-2 mg total) by mouth at bedtime as needed for anxiety.  60 tablet  5  . conjugated estrogens (PREMARIN) vaginal cream Place 1 Applicatorful vaginally 2 (two) times daily.      Marland Kitchen. donepezil (ARICEPT) 10 MG tablet Take 1 tablet (10 mg total) by mouth at bedtime.  30 tablet  12  . doxycycline (VIBRAMYCIN) 100 MG capsule Take 2 today, then 1 daily  8 capsule  0  . esomeprazole (NEXIUM) 40 MG capsule Take 40 mg by mouth daily at 12 noon.      . ezetimibe (ZETIA) 10 MG tablet Take 10 mg by mouth daily.      . fish oil-omega-3 fatty acids 1000 MG capsule Take 2,000 mg by mouth daily.       . furosemide (LASIX) 40 MG tablet Take 40 mg by mouth daily.      Marland Kitchen. HYDROcodone-acetaminophen (NORCO/VICODIN) 5-325 MG per tablet Take 1 tablet by mouth every 8 (eight) hours as needed. FILL ON OR AFTER 03/02/2014  90 tablet  0  . insulin glargine (LANTUS) 100 UNIT/ML injection sliding scale Pt uses between 20-30 units daily      . levalbuterol (XOPENEX) 0.63 MG/3ML  nebulizer solution Take 1 ampule by nebulization every 6 (six) hours as needed. For wheezing      . levothyroxine (SYNTHROID) 88 MCG tablet Take 1 tablet (88 mcg total) by mouth daily.  90 tablet  3  . Memantine HCl ER 28 MG CP24 Take 28 mg by mouth daily.  90 capsule  3  . metFORMIN (GLUCOPHAGE) 1000 MG tablet Take 1,000 mg by mouth 2 (two) times daily with a meal.      . Multiple Vitamins-Minerals (CENTRUM SILVER PO) Take 1 tablet by mouth daily.      . pravastatin (PRAVACHOL) 20 MG tablet Take 1 tablet (20 mg total) by mouth every evening.  30 tablet  11  . tiotropium (SPIRIVA) 18 MCG inhalation capsule Place 18 mcg into inhaler and inhale daily.      . valsartan (DIOVAN) 160 MG tablet Take 1 tablet (160 mg total) by mouth daily.  30 tablet  5  . vitamin A 8000 UNIT capsule Take 8,000 Units by mouth daily.      . [DISCONTINUED] Calcium Carbonate-Vitamin D (CALCIUM 600/VITAMIN D) 600-400 MG-UNIT per tablet Take 1 tablet by mouth 2 (two) times daily.        . [  DISCONTINUED] sodium chloride (OCEAN) 0.65 % nasal spray Place 2 sprays into the nose as needed. TO OPEN NASAL BREATHING       No current facility-administered medications on file prior to visit.

## 2014-04-10 NOTE — Telephone Encounter (Signed)
Ok to add Rx proair rescue inhaler # 1, 2 puffs every 6 hours as needed, refill prn

## 2014-04-10 NOTE — Telephone Encounter (Signed)
Pt aware RX has been sent. Nothing further needed 

## 2014-04-16 ENCOUNTER — Other Ambulatory Visit (HOSPITAL_COMMUNITY): Payer: Self-pay | Admitting: Internal Medicine

## 2014-04-17 ENCOUNTER — Ambulatory Visit (INDEPENDENT_AMBULATORY_CARE_PROVIDER_SITE_OTHER): Payer: Medicare Other

## 2014-04-17 DIAGNOSIS — J309 Allergic rhinitis, unspecified: Secondary | ICD-10-CM

## 2014-04-24 ENCOUNTER — Ambulatory Visit: Payer: Medicare Other

## 2014-04-30 ENCOUNTER — Telehealth: Payer: Self-pay | Admitting: Internal Medicine

## 2014-04-30 NOTE — Telephone Encounter (Signed)
OK to fill this prescription with additional refills x0 OV q 3 mo Thank you!  

## 2014-04-30 NOTE — Telephone Encounter (Signed)
Pt's daughter called to request refill for hydrocodone for [name]. Please call her

## 2014-05-01 ENCOUNTER — Encounter: Payer: Self-pay | Admitting: *Deleted

## 2014-05-01 MED ORDER — HYDROCODONE-ACETAMINOPHEN 5-325 MG PO TABS: 1.0000 | ORAL_TABLET | Freq: Three times a day (TID) | ORAL | Status: DC | PRN

## 2014-05-01 NOTE — Telephone Encounter (Signed)
Rx printed/ready for p/u. Left detailed mess informing pt.

## 2014-05-02 ENCOUNTER — Ambulatory Visit (INDEPENDENT_AMBULATORY_CARE_PROVIDER_SITE_OTHER): Payer: Medicare Other

## 2014-05-02 DIAGNOSIS — J309 Allergic rhinitis, unspecified: Secondary | ICD-10-CM

## 2014-05-03 ENCOUNTER — Encounter (HOSPITAL_COMMUNITY): Payer: Self-pay | Admitting: Emergency Medicine

## 2014-05-03 DIAGNOSIS — Z860100 Personal history of colon polyps, unspecified: Secondary | ICD-10-CM | POA: Insufficient documentation

## 2014-05-03 DIAGNOSIS — E119 Type 2 diabetes mellitus without complications: Secondary | ICD-10-CM | POA: Insufficient documentation

## 2014-05-03 DIAGNOSIS — R059 Cough, unspecified: Secondary | ICD-10-CM | POA: Diagnosis present

## 2014-05-03 DIAGNOSIS — E785 Hyperlipidemia, unspecified: Secondary | ICD-10-CM | POA: Diagnosis not present

## 2014-05-03 DIAGNOSIS — I10 Essential (primary) hypertension: Secondary | ICD-10-CM | POA: Diagnosis not present

## 2014-05-03 DIAGNOSIS — F3289 Other specified depressive episodes: Secondary | ICD-10-CM | POA: Insufficient documentation

## 2014-05-03 DIAGNOSIS — Z8739 Personal history of other diseases of the musculoskeletal system and connective tissue: Secondary | ICD-10-CM | POA: Insufficient documentation

## 2014-05-03 DIAGNOSIS — K219 Gastro-esophageal reflux disease without esophagitis: Secondary | ICD-10-CM | POA: Diagnosis not present

## 2014-05-03 DIAGNOSIS — Z87891 Personal history of nicotine dependence: Secondary | ICD-10-CM | POA: Insufficient documentation

## 2014-05-03 DIAGNOSIS — J4489 Other specified chronic obstructive pulmonary disease: Secondary | ICD-10-CM | POA: Insufficient documentation

## 2014-05-03 DIAGNOSIS — R071 Chest pain on breathing: Secondary | ICD-10-CM | POA: Insufficient documentation

## 2014-05-03 DIAGNOSIS — Z79899 Other long term (current) drug therapy: Secondary | ICD-10-CM | POA: Insufficient documentation

## 2014-05-03 DIAGNOSIS — F329 Major depressive disorder, single episode, unspecified: Secondary | ICD-10-CM | POA: Insufficient documentation

## 2014-05-03 DIAGNOSIS — Z862 Personal history of diseases of the blood and blood-forming organs and certain disorders involving the immune mechanism: Secondary | ICD-10-CM | POA: Insufficient documentation

## 2014-05-03 DIAGNOSIS — E039 Hypothyroidism, unspecified: Secondary | ICD-10-CM | POA: Insufficient documentation

## 2014-05-03 DIAGNOSIS — R05 Cough: Secondary | ICD-10-CM | POA: Diagnosis present

## 2014-05-03 DIAGNOSIS — J449 Chronic obstructive pulmonary disease, unspecified: Secondary | ICD-10-CM | POA: Insufficient documentation

## 2014-05-03 DIAGNOSIS — Z794 Long term (current) use of insulin: Secondary | ICD-10-CM | POA: Insufficient documentation

## 2014-05-03 DIAGNOSIS — F411 Generalized anxiety disorder: Secondary | ICD-10-CM | POA: Insufficient documentation

## 2014-05-03 NOTE — ED Notes (Signed)
Pt. reports dry cough with right back pain worse when coughing and deep inspiration onset this evening . 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

## 2014-05-04 ENCOUNTER — Emergency Department (HOSPITAL_COMMUNITY)
Admission: EM | Admit: 2014-05-04 | Discharge: 2014-05-04 | Disposition: A | Discharge status: Home / Self Care | Payer: Medicare Other | Attending: Emergency Medicine | Admitting: Emergency Medicine

## 2014-05-04 ENCOUNTER — Emergency Department (HOSPITAL_COMMUNITY): Payer: Medicare Other

## 2014-05-04 DIAGNOSIS — R0789 Other chest pain: Secondary | ICD-10-CM

## 2014-05-04 DIAGNOSIS — R071 Chest pain on breathing: Secondary | ICD-10-CM | POA: Diagnosis not present

## 2014-05-04 NOTE — Discharge Instructions (Signed)
Your evaluation today did not show any pneumonia or injury to your chest wall.  Continue taking your medications as prescribed.  Follow up with your doctor for recheck in 2-3 days.  Return to the ER for worsening condition or new concerning symptoms.   Chest Wall Pain Chest wall pain is pain felt in or around the chest bones and muscles. It may take up to 6 weeks to get better. It may take longer if you are active. Chest wall pain can happen on its own. Other times, things like germs, injury, coughing, or exercise can cause the pain. HOME CARE   Avoid activities that make you tired or cause pain. Try not to use your chest, belly (abdominal), or side muscles. Do not use heavy weights.  Put ice on the sore area.  Put ice in a plastic bag.  Place a towel between your skin and the bag.  Leave the ice on for 15-20 minutes for the first 2 days.  Only take medicine as told by your doctor. GET HELP RIGHT AWAY IF:   You have more pain or are very uncomfortable.  You have a fever.  Your chest pain gets worse.  You have new problems.  You feel sick to your stomach (nauseous) or throw up (vomit).  You start to sweat or feel lightheaded.  You have a cough with mucus (phlegm).  You cough up blood. MAKE SURE YOU:   Understand these instructions.  Will watch your condition.  Will get help right away if you are not doing well or get worse. Document Released: 05/04/2008 Document Revised: 02/08/2012 Document Reviewed: 07/13/2011 [facility] Patient Information 2014 [place], [place].  Cough, Adult  A cough is a reflex that helps clear your throat and airways. It can help heal the body or may be a reaction to an irritated airway. A cough may only last 2 or 3 weeks (acute) or may last more than 8 weeks (chronic).  CAUSES Acute cough:  Viral or bacterial infections. Chronic cough:  Infections.  Allergies.  Asthma.  Post-nasal drip.  Smoking.  Heartburn or acid reflux.  Some  medicines.  Chronic lung problems (COPD).  Cancer. SYMPTOMS   Cough.  Fever.  Chest pain.  Increased breathing rate.  High-pitched whistling sound when breathing (wheezing).  Colored mucus that you cough up (sputum). TREATMENT   A bacterial cough may be treated with antibiotic medicine.  A viral cough must run its course and will not respond to antibiotics.  Your caregiver may recommend other treatments if you have a chronic cough. HOME CARE INSTRUCTIONS   Only take over-the-counter or prescription medicines for pain, discomfort, or fever as directed by your caregiver. Use cough suppressants only as directed by your caregiver.  Use a cold steam vaporizer or humidifier in your bedroom or home to help loosen secretions.  Sleep in a semi-upright position if your cough is worse at night.  Rest as needed.  Stop smoking if you smoke. SEEK IMMEDIATE MEDICAL CARE IF:   You have pus in your sputum.  Your cough starts to worsen.  You cannot control your cough with suppressants and are losing sleep.  You begin coughing up blood.  You have difficulty breathing.  You develop pain which is getting worse or is uncontrolled with medicine.  You have a fever. MAKE SURE YOU:   Understand these instructions.  Will watch your condition.  Will get help right away if you are not doing well or get worse. Document Released: 05/15/2011 Document  Revised: 02/08/2012 Document Reviewed: 05/15/2011 [employer] Patient Information 2014 [place], [place].  Musculoskeletal Pain Musculoskeletal pain is muscle and boney aches and pains. These pains can occur in any part of the body. Your caregiver may treat you without knowing the cause of the pain. They may treat you if blood or urine tests, X-rays, and other tests were normal.  CAUSES There is often not a definite cause or reason for these pains. These pains may be caused by a type of germ (virus). The discomfort may also come from overuse.  Overuse includes working out too hard when your body is not fit. Boney aches also come from weather changes. Bone is sensitive to atmospheric pressure changes. HOME CARE INSTRUCTIONS   Ask when your test results will be ready. Make sure you get your test results.  Only take over-the-counter or prescription medicines for pain, discomfort, or fever as directed by your caregiver. If you were given medications for your condition, do not drive, operate machinery or power tools, or sign legal documents for 24 hours. Do not drink alcohol. Do not take sleeping pills or other medications that may interfere with treatment.  Continue all activities unless the activities cause more pain. When the pain lessens, slowly resume normal activities. Gradually increase the intensity and duration of the activities or exercise.  During periods of severe pain, bed rest may be helpful. Lay or sit in any position that is comfortable.  Putting ice on the injured area.  Put ice in a bag.  Place a towel between your skin and the bag.  Leave the ice on for 15 to 20 minutes, 3 to 4 times a day.  Follow up with your caregiver for continued problems and no reason can be found for the pain. If the pain becomes worse or does not go away, it may be necessary to repeat tests or do additional testing. Your caregiver may need to look further for a possible cause. SEEK IMMEDIATE MEDICAL CARE IF:  You have pain that is getting worse and is not relieved by medications.  You develop chest pain that is associated with shortness or breath, sweating, feeling sick to your stomach (nauseous), or throw up (vomit).  Your pain becomes localized to the abdomen.  You develop any new symptoms that seem different or that concern you. MAKE SURE YOU:   Understand these instructions.  Will watch your condition.  Will get help right away if you are not doing well or get worse. Document Released: 11/16/2005 Document Revised: 02/08/2012  Document Reviewed: 07/21/2013 [facility] Patient Information 2014 [place].

## 2014-05-04 NOTE — ED Provider Notes (Signed)
CSN: [id number]     Arrival date & time 05/03/14  2332 History   First MD Initiated Contact with Patient 05/04/14 0304     Chief Complaint  Patient presents with  . Cough  . Back Pain     (Consider location/radiation/quality/duration/timing/severity/associated sxs/prior Treatment) HPI 68 yo female presents to the ER from home with complaint of cough with right upper back pain tonight.  Pt has h/o COPD, reports cough no different from her baseline.  No fevers, no change in sputum.  Pain is just under right shoulder blade, came on during coughing spell.  Pain is achy, worse with movement.  No pain currently.  No sob, no pleuritic type chest pain. Past Medical History  Diagnosis Date  . Personal history of colonic polyps   . Iron deficiency anemia, unspecified   . Other malaise and fatigue   . Chest pain, unspecified   . Other B-complex deficiencies   . Arthritis     Right Sacroiliac joint  . Other and unspecified hyperlipidemia   . Anxiety and depression   . HTN (hypertension)   . Unspecified hypothyroidism   . Type II or unspecified type diabetes mellitus without mention of complication, not stated as uncontrolled   . Allergic rhinitis, cause unspecified   . Unspecified asthma(493.90)   . Chronic airway obstruction, not elsewhere classified   . Abnormal stress test 08/21/2008    Neg  . GERD (gastroesophageal reflux disease)    Past Surgical History  Procedure Laterality Date  . Foot surgery      hammer toe 2004, 2005  . Abdominal hysterectomy  1982    menometorrhagia  . Carpal tunnel release  03/05/2009    bilateral  . Colonoscopy N/A 09/06/2013    Procedure: COLONOSCOPY;  Surgeon: Louis Meckel, MD;  Location: [facility];  Service: Endoscopy;  Laterality: N/A;  . Hot hemostasis N/A 09/06/2013    Procedure: HOT HEMOSTASIS (ARGON PLASMA COAGULATION/BICAP);  Surgeon: Louis Meckel, MD;  Location: [facility];  Service: Endoscopy;  Laterality: N/A;   Family History   Problem Relation Age of Onset  . Hypertension Mother   . Diabetes Mother   . Heart disease Mother     CHF  . Coronary artery disease Mother   . Lung cancer Father   . Hypertension Father   . Diabetes Father   . Liver cancer Father     w/mets  . Prostate cancer Father   . Breast cancer Sister   . Lung disease Brother     Agent Orange Related  . Asthma Brother   . Allergies Brother   . Colon cancer Neg Hx    History  Substance Use Topics  . Smoking status: Former Smoker    Quit date: 06/01/1997  . Smokeless tobacco: Never Used     Comment: Quit 9 years ago.  . Alcohol Use: No   OB History   Grav Para Term Preterm Abortions TAB SAB Ect Mult Living                 Review of Systems  All other systems reviewed and are negative.     Allergies  Aspirin; Codeine; Demerol; Latex; and Lisinopril  Home Medications   Prior to Admission medications   Medication Sig Start Date End Date Taking? Authorizing Provider  albuterol (PROAIR HFA) 108 (90 BASE) MCG/ACT inhaler Inhale 2 puffs into the lungs every 6 (six) hours as needed for wheezing or shortness of breath. 04/10/14  Yes Clinton D  Young, MD  azelastine (ASTELIN) 0.1 % nasal spray Place 2 sprays into both nostrils 2 (two) times daily. Use in each nostril as directed   Yes Historical Provider, MD  budesonide-formoterol (SYMBICORT) 80-4.5 MCG/ACT inhaler Inhale 2 puffs into the lungs 2 (two) times daily.   Yes Historical Provider, MD  clonazePAM (KLONOPIN) 1 MG tablet Take 1-2 tablets (1-2 mg total) by mouth at bedtime as needed for anxiety. 02/08/14  Yes Aleksei Plotnikov V, MD  conjugated estrogens (PREMARIN) vaginal cream Place 1 Applicatorful vaginally 2 (two) times daily.   Yes Historical Provider, MD  donepezil (ARICEPT) 10 MG tablet Take 1 tablet (10 mg total) by mouth at bedtime. 11/17/13  Yes Levert FeinsteinYijun Yan, MD  esomeprazole (NEXIUM) 40 MG capsule Take 40 mg by mouth daily at 12 noon.   Yes Historical Provider, MD   ezetimibe (ZETIA) 10 MG tablet Take 10 mg by mouth daily.   Yes Historical Provider, MD  fish oil-omega-3 fatty acids 1000 MG capsule Take 2,000 mg by mouth daily.    Yes Historical Provider, MD  furosemide (LASIX) 40 MG tablet Take 40 mg by mouth daily.   Yes Historical Provider, MD  HYDROcodone-acetaminophen (NORCO/VICODIN) 5-325 MG per tablet Take 1 tablet by mouth every 6 (six) hours as needed for moderate pain.   Yes Historical Provider, MD  insulin glargine (LANTUS) 100 UNIT/ML injection Inject 40 Units into the skin daily.   Yes Historical Provider, MD  levalbuterol Pauline Aus(XOPENEX) 0.63 MG/3ML nebulizer solution Take 1 ampule by nebulization every 6 (six) hours as needed. For wheezing 11/28/12  Yes Waymon Budgelinton D Young, MD  levothyroxine (SYNTHROID) 88 MCG tablet Take 1 tablet (88 mcg total) by mouth daily. 04/04/14 04/04/15 Yes Aleksei Plotnikov V, MD  Memantine HCl ER 28 MG CP24 Take 28 mg by mouth daily. 11/17/13  Yes Levert FeinsteinYijun Yan, MD  metFORMIN (GLUCOPHAGE) 1000 MG tablet Take 1,000 mg by mouth 2 (two) times daily with a meal.   Yes Historical Provider, MD  Multiple Vitamin (MULTIVITAMIN WITH MINERALS) TABS tablet Take 1 tablet by mouth daily.   Yes Historical Provider, MD  pravastatin (PRAVACHOL) 20 MG tablet Take 1 tablet (20 mg total) by mouth every evening. 11/20/13  Yes Jacques NavyMichael E Norins, MD  tiotropium (SPIRIVA) 18 MCG inhalation capsule Place 18 mcg into inhaler and inhale daily.   Yes Historical Provider, MD  valsartan (DIOVAN) 160 MG tablet Take 1 tablet (160 mg total) by mouth daily. 03/30/14  Yes Aleksei Plotnikov V, MD  vitamin A 8000 UNIT capsule Take 8,000 Units by mouth daily.   Yes Historical Provider, MD   BP 136/46  Pulse 46  Temp(Src) 97.6 F (36.4 C) (Oral)  Resp 18  SpO2 98% Physical Exam  Nursing note and vitals reviewed. Constitutional: She is oriented to person, place, and time. She appears well-developed and well-nourished.  Frail elderly chronically ill appearing female in  NAD  HENT:  Head: Normocephalic and atraumatic.  Nose: Nose normal.  Mouth/Throat: Oropharynx is clear and moist.  Eyes: Conjunctivae and EOM are normal. Pupils are equal, round, and reactive to light.  Neck: Normal range of motion. Neck supple. No JVD present. No tracheal deviation present. No thyromegaly present.  Cardiovascular: Normal rate, regular rhythm, normal heart sounds and intact distal pulses.  Exam reveals no gallop and no friction rub.   No murmur heard. Pulmonary/Chest: Effort normal and breath sounds normal. No stridor. No respiratory distress. She has no wheezes. She has no rales. She exhibits tenderness (mild ttp under  right scapula).  Abdominal: Soft. Bowel sounds are normal. She exhibits no distension and no mass. There is no tenderness. There is no rebound and no guarding.  Musculoskeletal: Normal range of motion. She exhibits no edema and no tenderness.  Lymphadenopathy:    She has no cervical adenopathy.  Neurological: She is alert and oriented to person, place, and time. She exhibits normal muscle tone. Coordination normal.  Skin: Skin is warm and dry. No rash noted. No erythema. No pallor.  Psychiatric: She has a normal mood and affect. Her behavior is normal. Judgment and thought content normal.    ED Course  Procedures (including critical care time) Labs Review Labs Reviewed - No data to display  Imaging Review Dg Chest 2 View  05/04/2014   CLINICAL DATA:  Cough, back pain  EXAM: CHEST  2 VIEW  COMPARISON:  Prior radiograph from 04/06/2014  FINDINGS: The cardiac and mediastinal silhouettes are stable in size and contour, and remain within normal limits. Atherosclerotic calcification again noted within the aortic arch.  The lungs are normally inflated. No airspace consolidation, pleural effusion, or pulmonary edema is identified. There is no pneumothorax.  No acute osseous abnormality identified.  IMPRESSION: No active cardiopulmonary disease.   Electronically  Signed   By: Rise Mu M.D.   On: 05/04/2014 00:46     EKG Interpretation None      MDM   Final diagnoses:  Chest wall pain    68 yo female with right shoulder pain tonight after coughing, now almost resolved.  No wheezing or respiratory distress on exam.  Vitals stable.  Pt not in distress.  Plan to f/u with pcm, warm compresses to the area, vicodin as needed for pain.    Olivia Mackie, MD 05/04/14 2100

## 2014-05-09 ENCOUNTER — Telehealth: Payer: Self-pay | Admitting: Internal Medicine

## 2014-05-09 MED ORDER — GLUCOSE BLOOD VI STRP: ORAL_STRIP | Status: DC

## 2014-05-09 NOTE — Telephone Encounter (Signed)
Pt has one test strip left.  She states [facility] on [address] has faxed the request several times.

## 2014-05-09 NOTE — Telephone Encounter (Signed)
Pt uses Accuchek Aviva plus strips tid-qid. Rf sent. Pt informed

## 2014-05-10 ENCOUNTER — Ambulatory Visit (INDEPENDENT_AMBULATORY_CARE_PROVIDER_SITE_OTHER): Payer: Medicare Other

## 2014-05-10 ENCOUNTER — Ambulatory Visit (INDEPENDENT_AMBULATORY_CARE_PROVIDER_SITE_OTHER): Payer: Medicare Other | Admitting: Internal Medicine

## 2014-05-10 ENCOUNTER — Encounter: Payer: Self-pay | Admitting: Internal Medicine

## 2014-05-10 VITALS — BP 118/68 | HR 65 | Ht 63.0 in | Wt 183.2 lb

## 2014-05-10 DIAGNOSIS — J449 Chronic obstructive pulmonary disease, unspecified: Secondary | ICD-10-CM

## 2014-05-10 DIAGNOSIS — J309 Allergic rhinitis, unspecified: Secondary | ICD-10-CM

## 2014-05-10 DIAGNOSIS — J301 Allergic rhinitis due to pollen: Secondary | ICD-10-CM

## 2014-05-10 NOTE — Patient Instructions (Addendum)
Ok to continue allergy vaccine 1:10 GH  Ok to continue Astelin nasal spay,  Or you could change to otc Flonase,  Or you could use both together. They are different medicines and can be used together if needed.  Ok to try off Spiriva and see how you do without it. Restart if you begin having problems with your breathing.

## 2014-05-10 NOTE — Progress Notes (Signed)
Subjective:   Patient ID: [name], female DOB: 26-Dec-1945, 68 y.o. MRN: [id number]  HPI  05/19/11- 69 yoF former heavy smoker followed for asthma/ COPD, hx food intolerance, allergic rhinitis, complicated by obesity, DM  Last here November 20, 2010- note reviewed  Allergy vaccine 1:10 GH  Says she has been doing well. Spring pollen bothered nose and chest in March. Had some infection then, helped by doxycycline.  Reports no change in dry hacking cough, not relieved by cough syrup. Occasional, infrequent heart burn.  CXR 02/08/11- [facility]- lungs clear, ASVD  We discussed updating PFT. She had smoked up to 3 PPD  07/13/11- 65 yoF former heavy smoker followed for asthma/ COPD, hx food intolerance, allergic rhinitis, complicated by obesity, DM  Did not schedule PFT after last visit as intended, but has done so now.  She feels well. Remains off cigarettes. Coughs only after using her inhaler- only a dry cough. No longer experiences shortness of breath.  Allergy vaccine- she feels they help her a lot. Went to ENT for ear pain- blamed throat congestion on reflux and Rx'd bid Nexium- "helps a lot".  09/18/11- 65 yoF former heavy smoker followed for asthma/ COPD, hx food intolerance, allergic rhinitis, complicated by obesity, DM  Has had flu vaccine. She had done fairly well since here in August. Last night she had onset of dry cough and head congestion without fever or sore throat. Intermittent pain in the right side of the neck at the angle of the jaw comes and goes. It is not associated with popping, vertigo, pain on chewing or altered hearing.  History of smoking 3 packs per day.  PFT 08/26/2011-FEV1 1.45/76%, FEV1/FVC 0.7 no, FEF 25-75% was 35% of predicted with slight response to bronchodilator. TLC 100% RV 131% DLCO 54%. Moderate obstruction with slight response to bronchodilator and air trapping. This is probably mostly some emphysema. CXR 01/29/2011-atherosclerosis with clear lung fields.   02/12/12- 65 yoF former heavy smoker followed for asthma/ COPD, hx food intolerance, allergic rhinitis, complicated by obesity, DM  Continues allergy vaccine without problems. She is sure it helps. She used her last doxycycline about 2 weeks ago for chest congestion. We discussed this.  With some vertigo and pressure in right ear. Often has pain in right side of neck. Occasional minor cough. She is on lisinopril and we discussed this carefully. Using rescue inhaler twice a week. Last PFT was 08/26/2011. Charted.   05/27/12- 65 yoF former heavy smoker followed for asthma/ COPD, hx food intolerance, allergic rhinitis, complicated by obesity, DM PCP Isabella Debby Bud  Still on Allergy Vaccine 1:10 here; states rash on body from thighs to neck-just itchy all the time; Denies any wheezing,cough,SOB, or congestion.  Has had pruritic rash on trunk and arms for 3 weeks, cause unknown. Cortisone cream has not seemed to help. She had taken cephalexin for UTI last week the rash began before that. Breathing has been good with no recent exacerbation and insignificant cough and wheeze.   11/28/12- 65 yoF former heavy smoker followed for asthma/ COPD, hx food intolerance, allergic rhinitis, complicated by obesity, DM PCP Isabella Debby Bud  FOLLOWS FOR: still on vaccine. having lots of congestion and wheezing.  For past 3 weeks acute illness. Sore throat moved to chest congestion with much wheeze, cough productive of clear mucus. Denies nasal congestion. She tried Augmentin which did not help. Did have flu vaccine. Family members living in the same house are very sick with similar illness.  She has continued  allergy vaccine 1:10 GH without problems  CXR 08/11/12  IMPRESSION:  No acute cardiopulmonary disease.  Original Report Authenticated By: [name], Isabella.D.  05/29/13- [age] yoF former heavy smoker followed for asthma/ COPD, hx food intolerance, allergic rhinitis, complicated by obesity, DM PCP [name]  FOLLOWS FOR: still on  allergy vaccine 1:10 GH and doing well.  No longer feels much problem with her breathing even on walks.  Substernal pain just as she lay down last night, fell asleep despite it. Notes knot on anterior chest.  CXR 03/01/13  IMPRESSION:  No acute abnormality.  Original Report Authenticated By: [name], Isabella.D.  HPI 07/20/13 [name] Patient comes in today for an acute sick visit.  The patient is usually followed by [name] for asthma and COPD.  She gives a 3 to four-day history of low grade fever, severe throat discomfort, as well as general malaise.  She denies any shortness of breath, cough with purulence, or chest congestion.  She has not had any sinus congestion or purulence noted, and denies severe pain.  She has had some neck soreness that she thinks is from lymphadenopathy.  She denies any issues with voice change, no difficulty swallowing solids or liquids, and no drooling.  11/28/13- 67yoF former heavy smoker followed for asthma/ COPD, hx food intolerance, allergic rhinitis, complicated by obesity,  Dementia, DM   PCP [name] FOLLOWS FOR: still on Allergy vaccine 1:10 GH and doing well; will have slight runny nose with sinus headaches at times. Pt needs Rx printed for Astelin(Name brand ONLY). Continues Spiriva and Symbicort. She says allergy vaccine helps she can tell the difference if she has no transportation and has to miss her shot. She insists that brand-name Astelin nasal spray works much better than generic.  05/10/14- 68yoF former heavy smoker followed for asthma/ COPD, hx food intolerance, allergic rhinitis, complicated by obesity,anxiety, dementia, DM   PCP [name] FOLLOWS FOR: Pt reports increased cough with some pain in right shoulder blade x 1 week ago. Seen [facility] 05/04/14.  Pt states that breathing has been doing okay since seen in ED.  blames allergy for watery rhinorrhea without sneezing. Continues allergy vaccine 1:10 GH and says it is"real good help". Using Astelin  nasal spray and Claritin. Denies cough or wheeze CXR 05/04/14 IMPRESSION:  No active cardiopulmonary disease.  Electronically Signed  By: Isabella MuBenjamin McClintock Isabella.D.  On: 05/04/2014 00:46   ROS-see HPI Constitutional:   No-   weight loss, night sweats, fevers, chills, fatigue, lassitude. HEENT:   No-  headaches, difficulty swallowing, tooth/dental problems, sore throat,       No-  sneezing, itching, ear ache, nasal congestion, +post nasal drip,  CV:  No-   chest pain, orthopnea, PND, swelling in lower extremities, anasarca, dizziness, palpitations Resp: No-   shortness of breath with exertion or at rest.              No-   productive cough,  No non-productive cough,  No- coughing up of blood.              No-   change in color of mucus.  No- wheezing.   Skin: No-   rash or lesions. GI:  No-   heartburn, indigestion, abdominal pain, nausea, vomiting, GU:  MS:  No-   joint pain or swelling.  Neuro-     nothing unusual Psych:  No- change in mood or affect. No depression or anxiety.  No memory loss.  Objective:  OBJ- Physical Exam General- Alert, Oriented, Affect-appropriate, Distress- none acute,  obese Skin- rash-none, lesions- none, excoriation- none Lymphadenopathy- none Head- atraumatic            Eyes- Gross vision intact, PERRLA, conjunctivae and secretions clear, + strabismus            Ears- Hearing, canals-normal            Nose- Clear, no-Septal dev, mucus, polyps, erosion, perforation             Throat- Mallampati II , mucosa clear , drainage- none, tonsils- atrophic Neck- flexible , trachea midline, no stridor , thyroid nl, carotid no bruit Chest - symmetrical excursion , unlabored           Heart/CV- RRR , no murmur , no gallop  , no rub, nl s1 s2                           - JVD- none , edema- none, stasis changes- none, varices- none           Lung- clear to P&A, wheeze- none, cough- none , dullness-none, rub- none           Chest wall-  Abd-  Br/ Gen/ Rectal- Not  done, not indicated Extrem- cyanosis- none, clubbing, none, atrophy- none, strength- nl Neuro- + tremor, +head Bob    Assessment & Plan:

## 2014-05-11 ENCOUNTER — Ambulatory Visit (INDEPENDENT_AMBULATORY_CARE_PROVIDER_SITE_OTHER)
Admission: RE | Admit: 2014-05-11 | Discharge: 2014-05-11 | Disposition: A | Discharge status: Home / Self Care | Payer: Medicare Other | Source: Ambulatory Visit | Attending: Internal Medicine | Admitting: Internal Medicine

## 2014-05-11 ENCOUNTER — Other Ambulatory Visit (INDEPENDENT_AMBULATORY_CARE_PROVIDER_SITE_OTHER): Payer: Medicare Other

## 2014-05-11 ENCOUNTER — Ambulatory Visit (INDEPENDENT_AMBULATORY_CARE_PROVIDER_SITE_OTHER): Payer: Medicare Other | Admitting: Internal Medicine

## 2014-05-11 ENCOUNTER — Encounter: Payer: Self-pay | Admitting: Internal Medicine

## 2014-05-11 ENCOUNTER — Telehealth: Payer: Self-pay | Admitting: Internal Medicine

## 2014-05-11 VITALS — BP 128/80 | HR 86 | Temp 97.6°F | Wt 183.4 lb

## 2014-05-11 DIAGNOSIS — E538 Deficiency of other specified B group vitamins: Secondary | ICD-10-CM

## 2014-05-11 DIAGNOSIS — M898X1 Other specified disorders of bone, shoulder: Secondary | ICD-10-CM

## 2014-05-11 DIAGNOSIS — E119 Type 2 diabetes mellitus without complications: Secondary | ICD-10-CM

## 2014-05-11 DIAGNOSIS — M899 Disorder of bone, unspecified: Secondary | ICD-10-CM

## 2014-05-11 DIAGNOSIS — M949 Disorder of cartilage, unspecified: Secondary | ICD-10-CM

## 2014-05-11 DIAGNOSIS — I10 Essential (primary) hypertension: Secondary | ICD-10-CM

## 2014-05-11 DIAGNOSIS — N951 Menopausal and female climacteric states: Secondary | ICD-10-CM

## 2014-05-11 LAB — BASIC METABOLIC PANEL
BUN: 20 mg/dL (ref 6–23)
CO2: 29 meq/L (ref 19–32)
CREATININE: 1 mg/dL (ref 0.4–1.2)
Calcium: 10.4 mg/dL (ref 8.4–10.5)
Chloride: 106 mEq/L (ref 96–112)
GFR: 61.76 mL/min (ref >60.00)
Glucose, Bld: 84 mg/dL (ref 70–99)
POTASSIUM: 4.1 meq/L (ref 3.5–5.1)
Sodium: 142 mEq/L (ref 135–145)

## 2014-05-11 LAB — HEMOGLOBIN A1C: HEMOGLOBIN A1C: 6.5 % (ref 4.6–6.5)

## 2014-05-11 MED ORDER — DOXYCYCLINE HYCLATE 100 MG PO CAPS
ORAL_CAPSULE | ORAL | Status: DC
Start: 2014-05-11 — End: 2014-09-05

## 2014-05-11 NOTE — Assessment & Plan Note (Signed)
Continue with current prescription therapy as reflected on the Med list.  

## 2014-05-11 NOTE — Telephone Encounter (Signed)
Ok doxycycline 100 mg, # 8, 2 today then one daily 

## 2014-05-11 NOTE — Telephone Encounter (Signed)
sabrina aware rx has been called in. Nothing further needed

## 2014-05-11 NOTE — Assessment & Plan Note (Signed)
6/15 [facility] ER visit note, test reports reviewed

## 2014-05-11 NOTE — Assessment & Plan Note (Signed)
Labs  Continue with current prescription therapy as reflected on the Med list.  

## 2014-05-11 NOTE — Progress Notes (Signed)
Patient ID: [name], female   DOB: 07/23/1946, 68 y.o.   MRN: [id number]   Subjective:    HPI  F/u ER visit for right shoulder pain on 6/5 after coughing. No wheezing or respiratory distress on exam. Vitals stable. Pt not in distress. Plan to f/u with pcm, warm compresses to the area, vicodin as needed for pain. The patient presents for a follow-up of  chronic hypertension, chronic dyslipidemia, type 2 diabetes controlled with medicines   F/u hernia on L side x 10 years  Wt Readings from Last 3 Encounters:  05/11/14 183 lb 6.4 oz (83.19 kg)  05/10/14 183 lb 3.2 oz (83.099 kg)  04/06/14 208 lb (94.348 kg)   BP Readings from Last 3 Encounters:  05/11/14 128/80  05/10/14 118/68  05/04/14 136/46      Review of Systems  Constitutional: Positive for unexpected weight change. Negative for chills, activity change, appetite change and fatigue.  HENT: Negative for congestion, mouth sores and sinus pressure.   Eyes: Negative for visual disturbance.  Respiratory: Negative for cough and chest tightness.   Gastrointestinal: Negative for nausea, vomiting and abdominal pain.  Genitourinary: Negative for urgency, frequency, hematuria, flank pain, difficulty urinating and vaginal pain.  Musculoskeletal: Negative for back pain and gait problem.  Skin: Negative for pallor, rash and wound.  Neurological: Positive for tremors. Negative for dizziness, weakness, numbness and headaches.  Psychiatric/Behavioral: Positive for sleep disturbance. Negative for suicidal ideas and confusion. The patient is nervous/anxious.        Objective:   Physical Exam  Constitutional: She appears well-developed. No distress.  HENT:  Head: Normocephalic.  Right Ear: External ear normal.  Left Ear: External ear normal.  Nose: Nose normal.  Mouth/Throat: Oropharynx is clear and moist.  Eyes: Conjunctivae are normal. Pupils are equal, round, and reactive to light. Right eye exhibits no discharge. Left eye  exhibits no discharge.  Neck: Normal range of motion. Neck supple. No JVD present. No tracheal deviation present. No thyromegaly present.  Cardiovascular: Normal rate, regular rhythm and normal heart sounds.   Pulmonary/Chest: No stridor. No respiratory distress. She has no wheezes.  Abdominal: Soft. Bowel sounds are normal. She exhibits no distension and no mass. There is no tenderness. There is no rebound and no guarding.  Musculoskeletal: She exhibits no edema and no tenderness.  Lymphadenopathy:    She has no cervical adenopathy.  Neurological: She displays normal reflexes. No cranial nerve deficit. She exhibits normal muscle tone. Coordination abnormal.  head tremor ++ Hands tremor +   Skin: No rash noted. No erythema.  Psychiatric: She has a normal mood and affect. Her behavior is normal. Judgment and thought content normal.     Lab Results  Component Value Date   WBC 6.8 02/08/2014   HGB 13.4 02/08/2014   HCT 39.5 02/08/2014   PLT 220.0 02/08/2014   GLUCOSE 113* 02/08/2014   CHOL 172 02/08/2014   TRIG 259.0* 02/08/2014   HDL 45.70 02/08/2014   LDLDIRECT 130.4 11/02/2013   LDLCALC 75 02/08/2014   ALT 13 02/08/2014   AST 19 02/08/2014   NA 141 02/08/2014   K 3.6 02/08/2014   CL 105 02/08/2014   CREATININE 1.0 02/08/2014   BUN 19 02/08/2014   CO2 29 02/08/2014   TSH 1.84 02/08/2014   HGBA1C 6.6* 02/08/2014   MICROALBUR 6.3* 05/10/2013        Assessment & Plan:

## 2014-05-11 NOTE — Progress Notes (Signed)
Pre visit review using our clinic review tool, if applicable. No additional management support is needed unless otherwise documented below in the visit note. 

## 2014-05-11 NOTE — Telephone Encounter (Signed)
Called and spoke with pts daughter in law and she stated that the pt is requesting that CY call in doxy for her.  Pt woke up this morning with runny nose, sinus headache, cough with yellow sputum at times.  CY please advise if ok to call in doxy for the pt.   Thanks  Last ov--05-10-14 Next ov--11-09-14  Allergies  Allergen Reactions  . Aspirin Itching  . Codeine Itching  . Demerol Itching    Unknown-unconscious.  . Latex Itching  . Lisinopril Itching and Cough    REACTION: causes cough    Current Outpatient Prescriptions on File Prior to Visit  Medication Sig Dispense Refill  . albuterol (PROAIR HFA) 108 (90 BASE) MCG/ACT inhaler Inhale 2 puffs into the lungs every 6 (six) hours as needed for wheezing or shortness of breath.  1 Inhaler  prn  . azelastine (ASTELIN) 0.1 % nasal spray Place 2 sprays into both nostrils 2 (two) times daily. Use in each nostril as directed      . budesonide-formoterol (SYMBICORT) 80-4.5 MCG/ACT inhaler Inhale 2 puffs into the lungs 2 (two) times daily.      . clonazePAM (KLONOPIN) 1 MG tablet Take 1-2 tablets (1-2 mg total) by mouth at bedtime as needed for anxiety.  60 tablet  5  . conjugated estrogens (PREMARIN) vaginal cream Place 1 Applicatorful vaginally 2 (two) times daily.      Marland Kitchen. donepezil (ARICEPT) 10 MG tablet Take 1 tablet (10 mg total) by mouth at bedtime.  30 tablet  12  . esomeprazole (NEXIUM) 40 MG capsule Take 40 mg by mouth daily at 12 noon.      . ezetimibe (ZETIA) 10 MG tablet Take 10 mg by mouth daily.      . fish oil-omega-3 fatty acids 1000 MG capsule Take 4,000 mg by mouth daily.       . furosemide (LASIX) 40 MG tablet Take 40 mg by mouth daily.      Marland Kitchen. glucose blood (ACCU-CHEK AVIVA PLUS) test strip Use three to four times daily as instructed. Dx: 250.01  100 each  5  . HYDROcodone-acetaminophen (NORCO/VICODIN) 5-325 MG per tablet Take 1 tablet by mouth every 6 (six) hours as needed for moderate pain.      Marland Kitchen. insulin glargine (LANTUS) 100  UNIT/ML injection Inject 40 Units into the skin daily.      Marland Kitchen. levalbuterol (XOPENEX) 0.63 MG/3ML nebulizer solution Take 1 ampule by nebulization every 6 (six) hours as needed. For wheezing      . levothyroxine (SYNTHROID) 88 MCG tablet Take 1 tablet (88 mcg total) by mouth daily.  90 tablet  3  . Memantine HCl ER 28 MG CP24 Take 28 mg by mouth daily.  90 capsule  3  . metFORMIN (GLUCOPHAGE) 1000 MG tablet Take 1,000 mg by mouth 2 (two) times daily with a meal.      . Multiple Vitamin (MULTIVITAMIN WITH MINERALS) TABS tablet Take 1 tablet by mouth daily.      . pravastatin (PRAVACHOL) 20 MG tablet Take 1 tablet (20 mg total) by mouth every evening.  30 tablet  11  . tiotropium (SPIRIVA) 18 MCG inhalation capsule Place 18 mcg into inhaler and inhale daily.      . valsartan (DIOVAN) 160 MG tablet Take 1 tablet (160 mg total) by mouth daily.  30 tablet  5  . vitamin A 8000 UNIT capsule Take 8,000 Units by mouth daily.      . [DISCONTINUED] Calcium Carbonate-Vitamin D (  CALCIUM 600/VITAMIN D) 600-400 MG-UNIT per tablet Take 1 tablet by mouth 2 (two) times daily.        . [DISCONTINUED] sodium chloride (OCEAN) 0.65 % nasal spray Place 2 sprays into the nose as needed. TO OPEN NASAL BREATHING       No current facility-administered medications on file prior to visit.

## 2014-05-15 ENCOUNTER — Encounter: Payer: Self-pay | Admitting: Internal Medicine

## 2014-05-15 ENCOUNTER — Telehealth: Payer: Self-pay | Admitting: Internal Medicine

## 2014-05-15 ENCOUNTER — Ambulatory Visit: Payer: Medicare Other | Admitting: Nurse Practitioner

## 2014-05-15 MED ORDER — AMOXICILLIN-POT CLAVULANATE 875-125 MG PO TABS: 1.0000 | ORAL_TABLET | Freq: Two times a day (BID) | ORAL | Status: DC

## 2014-05-15 NOTE — Telephone Encounter (Signed)
I called pt. Aware of recs. RX sent in. Nothing further needed

## 2014-05-15 NOTE — Telephone Encounter (Signed)
Spoke with [name] Pt c/o still having increased amounts of head congestion, runny nose, sinus headache/pressure Pt has completed doxycycline this AM  Requests this be refilled as she has not improved.  Allergies  Allergen Reactions  . Aspirin Itching  . Codeine Itching  . Demerol Itching    Unknown-unconscious.  . Latex Itching  . Lisinopril Itching and Cough    REACTION: causes cough   Please advise Dr Maple HudsonYoung. Thanks.    Medication List       This list is accurate as of: 05/15/14 10:48 AM.  Always use your most recent med list.               albuterol 108 (90 BASE) MCG/ACT inhaler  Commonly known as:  PROAIR HFA  Inhale 2 puffs into the lungs every 6 (six) hours as needed for wheezing or shortness of breath.     azelastine 0.1 % nasal spray  Commonly known as:  ASTELIN  Place 2 sprays into both nostrils 2 (two) times daily. Use in each nostril as directed     budesonide-formoterol 80-4.5 MCG/ACT inhaler  Commonly known as:  SYMBICORT  Inhale 2 puffs into the lungs 2 (two) times daily.     clonazePAM 1 MG tablet  Commonly known as:  KLONOPIN  Take 1-2 tablets (1-2 mg total) by mouth at bedtime as needed for anxiety.     conjugated estrogens vaginal cream  Commonly known as:  PREMARIN  Place 1 Applicatorful vaginally 2 (two) times daily.     donepezil 10 MG tablet  Commonly known as:  ARICEPT  Take 1 tablet (10 mg total) by mouth at bedtime.     doxycycline 100 MG capsule  Commonly known as:  VIBRAMYCIN  Take 2 today then 1 daily until gone     esomeprazole 40 MG capsule  Commonly known as:  NEXIUM  Take 40 mg by mouth daily at 12 noon.     ezetimibe 10 MG tablet  Commonly known as:  ZETIA  Take 10 mg by mouth daily.     fish oil-omega-3 fatty acids 1000 MG capsule  Take 4,000 mg by mouth daily.     furosemide 40 MG tablet  Commonly known as:  LASIX  Take 40 mg by mouth daily.     glucose blood test strip  Commonly known as:  ACCU-CHEK AVIVA  PLUS  Use three to four times daily as instructed. Dx: 250.01     HYDROcodone-acetaminophen 5-325 MG per tablet  Commonly known as:  NORCO/VICODIN  Take 1 tablet by mouth every 6 (six) hours as needed for moderate pain.     insulin glargine 100 UNIT/ML injection  Commonly known as:  LANTUS  Inject 40 Units into the skin daily.     levalbuterol 0.63 MG/3ML nebulizer solution  Commonly known as:  XOPENEX  Take 1 ampule by nebulization every 6 (six) hours as needed. For wheezing     levothyroxine 88 MCG tablet  Commonly known as:  SYNTHROID  Take 1 tablet (88 mcg total) by mouth daily.     Memantine HCl ER 28 MG Cp24  Take 28 mg by mouth daily.     metFORMIN 1000 MG tablet  Commonly known as:  GLUCOPHAGE  Take 1,000 mg by mouth 2 (two) times daily with a meal.     multivitamin with minerals Tabs tablet  Take 1 tablet by mouth daily.     pravastatin 20 MG tablet  Commonly known as:  PRAVACHOL  Take 1 tablet (20 mg total) by mouth every evening.     tiotropium 18 MCG inhalation capsule  Commonly known as:  SPIRIVA  Place 18 mcg into inhaler and inhale daily.     valsartan 160 MG tablet  Commonly known as:  DIOVAN  Take 1 tablet (160 mg total) by mouth daily.     vitamin A 8000 UNIT capsule  Take 8,000 Units by mouth daily.

## 2014-05-15 NOTE — Telephone Encounter (Signed)
Per phone note 05/15/14: Suggest Sudafed -PE otc, once or twice daily x 2-3 days and offer Rx augmentin 875 mg, # 10, one twice daily ---  I called and spoke with ben. Advised this was correct. Nothing further needed

## 2014-05-15 NOTE — Telephone Encounter (Signed)
Suggest Sudafed -PE otc, once or twice daily x 2-3 days and offer Rx augmentin 875 mg, # 10, one twice daily

## 2014-05-16 ENCOUNTER — Ambulatory Visit: Payer: 59 | Admitting: Internal Medicine

## 2014-05-17 ENCOUNTER — Ambulatory Visit: Payer: Medicare Other

## 2014-05-21 ENCOUNTER — Ambulatory Visit (INDEPENDENT_AMBULATORY_CARE_PROVIDER_SITE_OTHER): Payer: Medicare Other | Admitting: *Deleted

## 2014-05-21 DIAGNOSIS — E785 Hyperlipidemia, unspecified: Secondary | ICD-10-CM

## 2014-05-21 DIAGNOSIS — I10 Essential (primary) hypertension: Secondary | ICD-10-CM

## 2014-05-21 LAB — LIPID PANEL
Cholesterol: 119 mg/dL (ref 0–200)
HDL: 38.2 mg/dL — ABNORMAL LOW (ref >39.00)
LDL Cholesterol: 50 mg/dL (ref 0–99)
NonHDL: 80.8
Total CHOL/HDL Ratio: 3
Triglycerides: 156 mg/dL — ABNORMAL HIGH (ref 0.0–149.0)
VLDL: 31.2 mg/dL (ref 0.0–40.0)

## 2014-05-21 LAB — HEPATIC FUNCTION PANEL
ALBUMIN: 3.6 g/dL (ref 3.5–5.2)
ALT: 11 U/L (ref 0–35)
AST: 20 U/L (ref 0–37)
Alkaline Phosphatase: 50 U/L (ref 39–117)
BILIRUBIN DIRECT: 0.1 mg/dL (ref 0.0–0.3)
TOTAL PROTEIN: 6.3 g/dL (ref 6.0–8.3)
Total Bilirubin: 0.6 mg/dL (ref 0.2–1.2)

## 2014-05-22 ENCOUNTER — Ambulatory Visit (INDEPENDENT_AMBULATORY_CARE_PROVIDER_SITE_OTHER): Payer: Medicare Other | Admitting: Pharmacist

## 2014-05-22 VITALS — Wt 184.0 lb

## 2014-05-22 DIAGNOSIS — Z79899 Other long term (current) drug therapy: Secondary | ICD-10-CM

## 2014-05-22 DIAGNOSIS — E785 Hyperlipidemia, unspecified: Secondary | ICD-10-CM

## 2014-05-22 NOTE — Progress Notes (Signed)
Patient referred to lipid clinic by Dr. Debby BudNorins due to elevated LDL and non-HDL in patient with h/o diabetes and has failed lipid lowering therapies in the past.  She has since transferred to Dr. Posey ReaPlotnikov.  She is here for follow up since increasing her fish oil to 4 g/d, and following a low gluten diet.  She has lost 15 lbs on this diet, and is tolerating her pravastatin 20 mg qd, Zetia 10 mg qd, and fish oil 4 g/d well.  Her LDL has dropped from 75 mg/dL down to 50 mg/dL, and her non-HDL is now down to 81 mg/dL.    She tried simvastatin in the past, and she thinks she tried lipitor in the past, and both stopped due to urticaria.  She was also tried on Welchol, and this was also stopped but patient doesn't recall why.  I can't tell from the notes why Welchol was stopped either.  When I mentioned constipation, patient states that this was likely the reason Welchol was stopped. She tells me her bowels are moving very regular since starting to this low gluten diet, and very rarely has diarrhea now. Patient does have some mild underlying dementia.    Risk Factors:  Diabetes, HTN, family h/o CAD, age, low HDL - LDL goal < 70, non-HDL goal < 100 Meds:  Zetia 10 mg qd, Pravastatin 10 mg qd,  fish oil 4 g daily. Intolerant:  Simvastatin and atorvastatin (urticaria per patient), Welchol taken in past (not sure why stopped).  Patient is working with Dr. Posey ReaPlotnikov now to get glucose under better control.  She has become much better at eating regular meals without skipping.  Her blood sugars are under better control and she reports no hypoglycemia in the past month.  She tells me on average her glucose is ~ 80-100 mg/dL in the morning.  Social history:  Former smoker (quit over 10 years ago).  Doesn't drink alcohol. Family history:  CAD - mother.  Diabetes - mother and father. Exercise:  Was walking fairly regularly in the spring, but has stopped over past month due to the extreme heat.  Diet:  She has been  following a low gluten diet.  Apparently she has had GI problems, including chronic diarrhea, in the past and it was recommended that she go gluten free.  She has eliminated breads and other "white" foods and states her GI problems are much improved.  TShe continues to eat beef about once per week, otherwise chicken and fish are the main proteins in her diet.  She has increased the amount of vegetables in her diet, including salads.  She has lost ~ 15 lbs on this diet.  Labs:   04/2014 - TC 119, TG 156, HDL 38, LDL 50, non-HDL 81, LFTs normal (Pravastatin 20 mg qd, Zetia 10 mg qd, fish oil 4 g/d ; also eating a low gluten diet and lost 15 lbs) 01/2014 - TC 172, TG 259, HDL 45.7, LDL 75 (Pravastatin 20 mg qd, Zetia 10 mg qd, fish oil 2 g/d) 10/2013:  TC 198, TG 239, HDL 42, LDL 130 (Zetia 10 mg qd, fish oil 1 g/d)  Current Outpatient Prescriptions  Medication Sig Dispense Refill  . albuterol (PROAIR HFA) 108 (90 BASE) MCG/ACT inhaler Inhale 2 puffs into the lungs every 6 (six) hours as needed for wheezing or shortness of breath.  1 Inhaler  prn  . amoxicillin-clavulanate (AUGMENTIN) 875-125 MG per tablet Take 1 tablet by mouth 2 (two) times daily.  10  tablet  0  . azelastine (ASTELIN) 0.1 % nasal spray Place 2 sprays into both nostrils 2 (two) times daily. Use in each nostril as directed      . budesonide-formoterol (SYMBICORT) 80-4.5 MCG/ACT inhaler Inhale 2 puffs into the lungs 2 (two) times daily.      . clonazePAM (KLONOPIN) 1 MG tablet Take 1-2 tablets (1-2 mg total) by mouth at bedtime as needed for anxiety.  60 tablet  5  . conjugated estrogens (PREMARIN) vaginal cream Place 1 Applicatorful vaginally 2 (two) times daily.      Marland Kitchen donepezil (ARICEPT) 10 MG tablet Take 1 tablet (10 mg total) by mouth at bedtime.  30 tablet  12  . doxycycline (VIBRAMYCIN) 100 MG capsule Take 2 today then 1 daily until gone  8 capsule  0  . esomeprazole (NEXIUM) 40 MG capsule Take 40 mg by mouth daily at 12 noon.       . ezetimibe (ZETIA) 10 MG tablet Take 10 mg by mouth daily.      . fish oil-omega-3 fatty acids 1000 MG capsule Take 4,000 mg by mouth daily.       . furosemide (LASIX) 40 MG tablet Take 40 mg by mouth daily.      Marland Kitchen glucose blood (ACCU-CHEK AVIVA PLUS) test strip Use three to four times daily as instructed. Dx: 250.01  100 each  5  . HYDROcodone-acetaminophen (NORCO/VICODIN) 5-325 MG per tablet Take 1 tablet by mouth every 6 (six) hours as needed for moderate pain.      Marland Kitchen insulin glargine (LANTUS) 100 UNIT/ML injection Inject 40 Units into the skin daily.      Marland Kitchen levalbuterol (XOPENEX) 0.63 MG/3ML nebulizer solution Take 1 ampule by nebulization every 6 (six) hours as needed. For wheezing      . levothyroxine (SYNTHROID) 88 MCG tablet Take 1 tablet (88 mcg total) by mouth daily.  90 tablet  3  . Memantine HCl ER 28 MG CP24 Take 28 mg by mouth daily.  90 capsule  3  . metFORMIN (GLUCOPHAGE) 1000 MG tablet Take 1,000 mg by mouth 2 (two) times daily with a meal.      . Multiple Vitamin (MULTIVITAMIN WITH MINERALS) TABS tablet Take 1 tablet by mouth daily.      . pravastatin (PRAVACHOL) 20 MG tablet Take 1 tablet (20 mg total) by mouth every evening.  30 tablet  11  . tiotropium (SPIRIVA) 18 MCG inhalation capsule Place 18 mcg into inhaler and inhale daily.      . valsartan (DIOVAN) 160 MG tablet Take 1 tablet (160 mg total) by mouth daily.  30 tablet  5  . vitamin A 8000 UNIT capsule Take 8,000 Units by mouth daily.      . [DISCONTINUED] Calcium Carbonate-Vitamin D (CALCIUM 600/VITAMIN D) 600-400 MG-UNIT per tablet Take 1 tablet by mouth 2 (two) times daily.        . [DISCONTINUED] sodium chloride (OCEAN) 0.65 % nasal spray Place 2 sprays into the nose as needed. TO OPEN NASAL BREATHING       No current facility-administered medications for this visit.   Allergies  Allergen Reactions  . Aspirin Itching  . Codeine Itching  . Demerol Itching    Unknown-unconscious.  . Latex Itching  . Lipitor  [Atorvastatin]     Urticaria   . Lisinopril Itching and Cough    REACTION: causes cough  . Zocor [Simvastatin]     Urticaria     Given patient's h/o diabetes, would  like to see LDL at least < 100 mg/dL, and preferably < 70 mg/dL.Labs showed LDL at 75,however this number is probably truly higher due to the elevated triglycerides.  Patient had LDL of 68 mg/dL in past on simvastatin + Zetia. Given she failed simvastatin and atorvastatin due to urticaria, will try a different statin today. Want to make sure we don't use something that may worsen dementia as well. Her dietary changes with the gluten free diet should hopefully help to decrease the triglycerides.  Her family seems to be working quite dilligently to get her on a healthy diet for her.  For now I will leave her on the low dose pravastatin (20 mg daily), but increase fish oil to 4 g/d. Fish oil will hopefully lower non-HDL and TG further, and also may help with memory. She has become more compliant with medications since her last visit and the family states that she has no problems with swallowing pills. Will recheck cholesterol / liver in 3 months, then see me 1 day later.   Plan: 1.  Increase fish oil to 4,000 mg (4 capsules) daily.  Can take 2 tables twice daily or all 4 at same time 2.  Continue pravastatin 20mg  once daily in the evenings 3.  Continue zetia 10mg  once daily 4.  Try to limit red meats to no more than once weekly, and pork less often 5.  Recheck cholesterol in 3 months and follow up with Riki RuskJeremy

## 2014-05-22 NOTE — Patient Instructions (Signed)
1.  Continue pravastatin 20 mg once daily. 2.  Continue Zetia 10 mg once daily 3.  Continue fish oil 4 capsules per day. 4.  Continue your low fat, low gluten diet as weight loss has continued. 5.  Recheck lipid and liver blood work in 6 months (11/12/14 - fasting lab work, show up anytime after 7:30 am);  See Riki RuskJeremy Smart the next day to review on 11/13/14 at 2:00 pm

## 2014-05-22 NOTE — Assessment & Plan Note (Addendum)
Lipid panel continues to improve since increasing fish oil to 4 g/d and changing to a low gluten diet.  Her bowel movements and GI upset has also improved.  LDL and non-HDL well controlled on current regimen, so will continue pravastatin 20 mg qd, Zetia 10 mg qd, and fish oil 4 g/d.  Will recheck blood work in 6 months and see me the next day.  She will call if any issues between now and then. Plan: 1.  Continue pravastatin 20 mg once daily. 2.  Continue Zetia 10 mg once daily 3.  Continue fish oil 4 capsules per day. 4.  Continue your low fat, low gluten diet as weight loss has continued. 5.  Recheck lipid and liver blood work in 6 months (11/12/14 - fasting lab work, show up anytime after 7:30 am);  See Riki RuskJeremy Smart the next day to review on 11/13/14 at 2:00 pm

## 2014-05-23 ENCOUNTER — Ambulatory Visit (INDEPENDENT_AMBULATORY_CARE_PROVIDER_SITE_OTHER): Payer: Medicare Other

## 2014-05-23 DIAGNOSIS — J309 Allergic rhinitis, unspecified: Secondary | ICD-10-CM

## 2014-05-29 ENCOUNTER — Ambulatory Visit: Payer: Medicare Other | Admitting: Internal Medicine

## 2014-05-30 ENCOUNTER — Encounter: Payer: Self-pay | Admitting: Internal Medicine

## 2014-05-30 ENCOUNTER — Ambulatory Visit (INDEPENDENT_AMBULATORY_CARE_PROVIDER_SITE_OTHER): Payer: Medicare Other

## 2014-05-30 ENCOUNTER — Ambulatory Visit (INDEPENDENT_AMBULATORY_CARE_PROVIDER_SITE_OTHER): Payer: Medicare Other | Admitting: Internal Medicine

## 2014-05-30 VITALS — BP 100/64 | HR 68 | Temp 97.4°F | Resp 16 | Wt 184.0 lb

## 2014-05-30 DIAGNOSIS — E119 Type 2 diabetes mellitus without complications: Secondary | ICD-10-CM

## 2014-05-30 DIAGNOSIS — E538 Deficiency of other specified B group vitamins: Secondary | ICD-10-CM

## 2014-05-30 DIAGNOSIS — E785 Hyperlipidemia, unspecified: Secondary | ICD-10-CM

## 2014-05-30 DIAGNOSIS — I10 Essential (primary) hypertension: Secondary | ICD-10-CM

## 2014-05-30 DIAGNOSIS — J309 Allergic rhinitis, unspecified: Secondary | ICD-10-CM

## 2014-05-30 DIAGNOSIS — R413 Other amnesia: Secondary | ICD-10-CM

## 2014-05-30 NOTE — Assessment & Plan Note (Signed)
Continue with current prescription therapy as reflected on the Med list.  

## 2014-05-30 NOTE — Progress Notes (Signed)
Pre visit review using our clinic review tool, if applicable. No additional management support is needed unless otherwise documented below in the visit note. 

## 2014-05-30 NOTE — Progress Notes (Signed)
   Subjective:    Patient ID: Isabella DuttonGlenda W Andrus, female    DOB: 06/14/1946, 68 y.o.   MRN: [id number]  HPI   The patient has been doing well overall without major physical or psychological issues going on lately. Better on gluten free diet.  The patient presents for a follow-up of  chronic hypertension, chronic dyslipidemia, type 2 diabetes controlled with medicines   Wt Readings from Last 3 Encounters:  05/30/14 184 lb (83.462 kg)  05/22/14 184 lb (83.462 kg)  05/11/14 183 lb 6.4 oz (83.19 kg)   BP Readings from Last 3 Encounters:  05/30/14 100/64  05/11/14 128/80  05/10/14 118/68      Review of Systems  Constitutional: Positive for unexpected weight change. Negative for chills, activity change, appetite change and fatigue.  HENT: Negative for congestion, mouth sores and sinus pressure.   Eyes: Negative for visual disturbance.  Respiratory: Negative for cough and chest tightness.   Gastrointestinal: Negative for nausea, vomiting and abdominal pain.  Genitourinary: Negative for urgency, frequency, hematuria, flank pain, difficulty urinating and vaginal pain.  Musculoskeletal: Negative for back pain and gait problem.  Skin: Negative for pallor, rash and wound.  Neurological: Positive for tremors. Negative for dizziness, weakness, numbness and headaches.  Psychiatric/Behavioral: Positive for sleep disturbance. Negative for suicidal ideas and confusion. The patient is nervous/anxious.        Objective:   Physical Exam  Constitutional: She appears well-developed. No distress.  HENT:  Head: Normocephalic.  Right Ear: External ear normal.  Left Ear: External ear normal.  Nose: Nose normal.  Mouth/Throat: Oropharynx is clear and moist.  Eyes: Conjunctivae are normal. Pupils are equal, round, and reactive to light. Right eye exhibits no discharge. Left eye exhibits no discharge.  Neck: Normal range of motion. Neck supple. No JVD present. No tracheal deviation present. No  thyromegaly present.  Cardiovascular: Normal rate, regular rhythm and normal heart sounds.   Pulmonary/Chest: No stridor. No respiratory distress. She has no wheezes.  Abdominal: Soft. Bowel sounds are normal. She exhibits no distension and no mass. There is no tenderness. There is no rebound and no guarding.  Musculoskeletal: She exhibits no edema and no tenderness.  Lymphadenopathy:    She has no cervical adenopathy.  Neurological: She displays normal reflexes. No cranial nerve deficit. She exhibits normal muscle tone. Coordination abnormal.  head tremor ++ Hands tremor +   Skin: No rash noted. No erythema.  Psychiatric: She has a normal mood and affect. Her behavior is normal. Judgment and thought content normal.     Lab Results  Component Value Date   WBC 6.8 02/08/2014   HGB 13.4 02/08/2014   HCT 39.5 02/08/2014   PLT 220.0 02/08/2014   GLUCOSE 84 05/11/2014   CHOL 119 05/21/2014   TRIG 156.0* 05/21/2014   HDL 38.20* 05/21/2014   LDLDIRECT 130.4 11/02/2013   LDLCALC 50 05/21/2014   ALT 11 05/21/2014   AST 20 05/21/2014   NA 142 05/11/2014   K 4.1 05/11/2014   CL 106 05/11/2014   CREATININE 1.0 05/11/2014   BUN 20 05/11/2014   CO2 29 05/11/2014   TSH 1.84 02/08/2014   HGBA1C 6.5 05/11/2014   MICROALBUR 6.3* 05/10/2013        Assessment & Plan:

## 2014-05-31 ENCOUNTER — Telehealth: Payer: Self-pay | Admitting: Internal Medicine

## 2014-05-31 NOTE — Telephone Encounter (Signed)
Relevant patient education mailed to patient.  

## 2014-06-06 ENCOUNTER — Ambulatory Visit (INDEPENDENT_AMBULATORY_CARE_PROVIDER_SITE_OTHER): Payer: Medicare Other

## 2014-06-06 DIAGNOSIS — J309 Allergic rhinitis, unspecified: Secondary | ICD-10-CM

## 2014-06-07 ENCOUNTER — Other Ambulatory Visit: Payer: Self-pay | Admitting: *Deleted

## 2014-06-07 NOTE — Telephone Encounter (Signed)
Requesting refill on her hydrocodone.../lmb 

## 2014-06-09 MED ORDER — HYDROCODONE-ACETAMINOPHEN 5-325 MG PO TABS: 1.0000 | ORAL_TABLET | Freq: Four times a day (QID) | ORAL | Status: DC | PRN

## 2014-06-09 NOTE — Addendum Note (Signed)
Addended by: Deatra JamesBRAND, Trayven Lumadue M on: 06/09/2014 09:48 AM   Modules accepted: Orders

## 2014-06-09 NOTE — Telephone Encounter (Signed)
MD printed first script we can't find. Reprinted place on counter for md to sign on Monday...Raechel Chute/lmb

## 2014-06-11 ENCOUNTER — Ambulatory Visit (INDEPENDENT_AMBULATORY_CARE_PROVIDER_SITE_OTHER): Payer: Medicare Other

## 2014-06-11 ENCOUNTER — Telehealth: Payer: Self-pay | Admitting: Internal Medicine

## 2014-06-11 DIAGNOSIS — J309 Allergic rhinitis, unspecified: Secondary | ICD-10-CM

## 2014-06-11 NOTE — Telephone Encounter (Signed)
OK to fill this prescription with additional refills x0 OV q 3 mo Thank you!  

## 2014-06-11 NOTE — Telephone Encounter (Signed)
Patient is calling for a new rx for Hydrocodone. Please call when ready to pick-up.

## 2014-06-11 NOTE — Telephone Encounter (Signed)
lmtcb

## 2014-06-11 NOTE — Telephone Encounter (Signed)
MD has sign notified pt rx ready for pick up...[name]/lmb

## 2014-06-11 NOTE — Telephone Encounter (Signed)
Duplicate msg pt was contact this am.../lmb

## 2014-06-12 ENCOUNTER — Telehealth: Payer: Self-pay | Admitting: *Deleted

## 2014-06-12 NOTE — Telephone Encounter (Signed)
Spoke with patient and she said that she has been having headaches for the past 2 wks, and her head gets hot. Explained to patient that since she has not been seen at our office she would need to contact her PCP to be seen for the headaches in our office , she verbalized understanding and will contact pcp and have them fax over notes if he feels she needs to been seen by Neurologist for headaches.

## 2014-06-13 ENCOUNTER — Telehealth: Payer: Self-pay | Admitting: Neurology

## 2014-06-13 ENCOUNTER — Ambulatory Visit: Payer: Medicare Other

## 2014-06-13 ENCOUNTER — Telehealth: Payer: Self-pay | Admitting: Internal Medicine

## 2014-06-13 NOTE — Telephone Encounter (Signed)
Please check with patient, based on last visit in Dec 2014, there was no MRI planned, what MRI she is talking about? She has follow up with Eber Jonesarolyn in 8/18

## 2014-06-13 NOTE — Telephone Encounter (Signed)
Needs OV Thx 

## 2014-06-13 NOTE — Telephone Encounter (Signed)
Pt is aware that she need an appt.

## 2014-06-13 NOTE — Telephone Encounter (Signed)
Pt request order from MRI [facility] due to headache that she is experiencing. Please advise.

## 2014-06-13 NOTE — Telephone Encounter (Signed)
Called pt and stated that she was just letting Dr. Terrace ArabiaYan know that she has asked her PCP to order a MRI done, because she was told that we did not see her for headaches. I advised the pt that if she has any other problems, questions or concerns to call the office. Pt verbalized understanding. FYI

## 2014-06-20 ENCOUNTER — Ambulatory Visit: Payer: Medicare Other | Admitting: Internal Medicine

## 2014-06-22 ENCOUNTER — Ambulatory Visit: Payer: Medicare Other | Admitting: Internal Medicine

## 2014-06-27 ENCOUNTER — Ambulatory Visit (INDEPENDENT_AMBULATORY_CARE_PROVIDER_SITE_OTHER): Payer: Medicare Other

## 2014-06-27 DIAGNOSIS — J309 Allergic rhinitis, unspecified: Secondary | ICD-10-CM

## 2014-06-28 ENCOUNTER — Other Ambulatory Visit: Payer: Self-pay | Admitting: Internal Medicine

## 2014-06-28 ENCOUNTER — Telehealth: Payer: Self-pay | Admitting: Internal Medicine

## 2014-06-28 MED ORDER — "BD ECLIPSE SYRINGE 25G X 1"" 3 ML MISC": Status: DC

## 2014-06-28 NOTE — Telephone Encounter (Signed)
Patient only has one insulin needle.  Daughter in law states patient has been reusing needles!  They are requesting a script for the [facility] brand thin needles to be sent to [facility] on [address].

## 2014-06-28 NOTE — Telephone Encounter (Signed)
[name] recalls you saying she could try stopping her vac.,take sudafed and see how she does. I couldn't find this discussion in your notes. I mentioned this to Florentina AddisonKatie and she recalls the conversation. Please advise. Pt. has one shot left her daughter-in-law scheduled her for next Wed. To get it.(FYI)

## 2014-06-28 NOTE — Telephone Encounter (Signed)
Ok to stop allergy vaccine when she wants. Let me know if she decides to stop.

## 2014-06-28 NOTE — Telephone Encounter (Signed)
Notified Sabreen[facility] syringes has been sent to [facility]...Raechel Chute/lmb

## 2014-06-29 NOTE — Telephone Encounter (Signed)
When she comes in next Wed. I find out if she wants to stop her shots.

## 2014-06-29 NOTE — Telephone Encounter (Signed)
Ok

## 2014-07-04 ENCOUNTER — Ambulatory Visit: Payer: Medicare Other

## 2014-07-10 ENCOUNTER — Encounter: Payer: Self-pay | Admitting: Internal Medicine

## 2014-07-10 NOTE — Assessment & Plan Note (Signed)
Well controlled, mild intermittent

## 2014-07-10 NOTE — Assessment & Plan Note (Signed)
She feels she is doing well with allergy vaccine and wants to continue. She is describing watery rhinorrhea now despite Astelin nasal spray and Claritin. This is more likely to be a vasomotor/nonallergic rhinorrhea and we may want to consider ipratropium

## 2014-07-13 ENCOUNTER — Telehealth: Payer: Self-pay | Admitting: Internal Medicine

## 2014-07-13 NOTE — Telephone Encounter (Signed)
Patient is calling for a new rx for Hydrocodone. Please call when ready to pick-up.

## 2014-07-13 NOTE — Telephone Encounter (Signed)
OK to fill this prescription with additional refills x0 OV q 3 mo Thank you!  

## 2014-07-16 MED ORDER — HYDROCODONE-ACETAMINOPHEN 5-325 MG PO TABS: 1.0000 | ORAL_TABLET | Freq: Four times a day (QID) | ORAL | Status: DC | PRN

## 2014-07-16 NOTE — Telephone Encounter (Signed)
Rx upfront for p/u. Pt informed  

## 2014-07-17 ENCOUNTER — Encounter: Payer: Self-pay | Admitting: Adult Health

## 2014-07-17 ENCOUNTER — Ambulatory Visit (INDEPENDENT_AMBULATORY_CARE_PROVIDER_SITE_OTHER): Payer: Medicare Other | Admitting: Adult Health

## 2014-07-17 VITALS — BP 144/77 | HR 56 | Ht 63.0 in | Wt 180.0 lb

## 2014-07-17 DIAGNOSIS — R269 Unspecified abnormalities of gait and mobility: Secondary | ICD-10-CM

## 2014-07-17 DIAGNOSIS — R413 Other amnesia: Secondary | ICD-10-CM

## 2014-07-17 NOTE — Patient Instructions (Signed)

## 2014-07-17 NOTE — Progress Notes (Signed)
PATIENT: [name] DOB: 14-Jan-1946  REASON FOR VISIT: follow up HISTORY FROM: patient  HISTORY OF PRESENT ILLNESS: [name] is a 68 year old female with a history of memory loss. [name] returns today for an evaluation. [name] is currently taking Aricept and Namenda  is tolerating it well. Daughter in law states that [name] tends to forget "little things" but nothing major. Daughter in law states that [name] helps her with her medication because [name] thinks [name] was forgetting that [name] took the clonazepam and [name] would take another dose. Denies having to give up anything due to her memory. [name] does limited cooking. The daughter in law will prepare large meals. [name] no longer drives. Her son and daughter in law do all the finances. Patient does complain of a mild headache that occur throughout the month. [name] is requesting to have an MRI of the brain to prove to her family that [name] does not have dementia. Daughter in law explains that [name] has 2 daughters that are trying to take control of her finances. At this time the patient states [name] only wants her son to control her affairs.  HISTORY 11/17/13 (CW): 68 year old right-handed Caucasian female, accompanied by her family at visit , [name] is referred by primary care Dr. Filbert Schilder for evaluation of memory loss, last clinical visit was August 2013  [name] has past medical history of hypertension, diabetes,hypothyroidism, depression anxiety, COPD  [name] had 10 year education, worked as Lawyer for 25 years. has 7 children, went on disability in 2004 due to gait difficulty,  [name] had gradual onset memory loss since 2012, [name] has labile glucose up to 400s, forget to take her insulin sometimes, increased difficulty over past few months, anxious, not herself, increased falling.  [name] also has 20 years history of unsteady gait, gradual onset, tends to veer to the right side. [name] has long-standing history of diabetes, but [name] denied bilateral lower extremity paresthesia, [name]  previously had valgus toes bilaterally, requiring surgery.  MRI brain in 2012 showed Mild scattered periventricular and subcortical T2 hyperintensities consistent with chronic microvascular ischemic disease, enlarged, partially empty sella.  [name] ws admitted by Dr Filbert Schilder from July 24 -26, for worsening delusion, agitation, was evaluated by psychologists was diagnosed with dementia with delusion,  Zyprexa was added on, now [name] is on 5 mg every night, still has delusions, see worms in her drinks, sometimes poured out drinks but [name] has fair appetite, sleeping well, still help with house chore, [name] lives with her son, and daughter-in-law in her house, her daughter-in-law stays at home with her.  Repeat MRI of brainin 2013 showed mild atrophy, small vessel disease, no acute lesions, laboratory showed A1c 8.2, elevated TSH, 6.97,  UPDATE 11/17/2013:  [name] has trouble remembering, forget grandchildren's name, could not name object. [name] could not remember conversation. [name] is eating well, [name] fell recently with right foot fracture. [name] sleeps well. [name] still helps house chore, sweeping floor,doing laundary. [name] does not cook any more.Overall [name] is stable, no longer has visual hallucination, less agitated,  [name] is taking Namenda 10 mg twice a day   REVIEW OF SYSTEMS: Full 14 system review of systems performed and notable only for:  Constitutional: Chills Eyes: Eye redness Ear/Nose/Throat: N/A  Skin: N/A  Cardiovascular: N/A  Respiratory: Cough, wheezing Gastrointestinal: Swollen abdomen, diarrhea  Genitourinary: N/A Hematology/Lymphatic: N/A  Endocrine: Flushing Musculoskeletal: Neck pain Allergy/Immunology: Her mental allergies, food allergies  Neurological: Memory loss, headache Psychiatric: N/A Sleep: N/A  ALLERGIES: Allergies  Allergen Reactions  . Aspirin Itching  . Codeine Itching  . Demerol Itching    Unknown-unconscious.  . Latex Itching  . Lipitor [Atorvastatin]     Urticaria     . Lisinopril Itching and Cough    REACTION: causes cough  . Zocor [Simvastatin]     Urticaria     HOME MEDICATIONS: Outpatient Prescriptions Prior to Visit  Medication Sig Dispense Refill  . ACCU-CHEK FASTCLIX LANCETS MISC USE AS DIRECTED  102 each  11  . albuterol (PROAIR HFA) 108 (90 BASE) MCG/ACT inhaler Inhale 2 puffs into the lungs every 6 (six) hours as needed for wheezing or shortness of breath.  1 Inhaler  prn  . azelastine (ASTELIN) 0.1 % nasal spray Place 2 sprays into both nostrils 2 (two) times daily. Use in each nostril as directed      . BD ECLIPSE SYRINGE 25G X 1" 3 ML MISC Use to administer insulin daily  100 each  3  . budesonide-formoterol (SYMBICORT) 80-4.5 MCG/ACT inhaler Inhale 2 puffs into the lungs 2 (two) times daily.      . clonazePAM (KLONOPIN) 1 MG tablet Take 1-2 tablets (1-2 mg total) by mouth at bedtime as needed for anxiety.  60 tablet  5  . conjugated estrogens (PREMARIN) vaginal cream Place 1 Applicatorful vaginally 2 (two) times daily.      Marland Kitchen. donepezil (ARICEPT) 10 MG tablet Take 1 tablet (10 mg total) by mouth at bedtime.  30 tablet  12  . doxycycline (VIBRAMYCIN) 100 MG capsule Take 2 today then 1 daily until gone  8 capsule  0  . esomeprazole (NEXIUM) 40 MG capsule Take 40 mg by mouth daily at 12 noon.      . ezetimibe (ZETIA) 10 MG tablet Take 10 mg by mouth daily.      . fish oil-omega-3 fatty acids 1000 MG capsule Take 4,000 mg by mouth daily.       . furosemide (LASIX) 40 MG tablet Take 40 mg by mouth daily.      Marland Kitchen. glucose blood (ACCU-CHEK AVIVA PLUS) test strip Use three to four times daily as instructed. Dx: 250.01  100 each  5  . HYDROcodone-acetaminophen (NORCO/VICODIN) 5-325 MG per tablet Take 1 tablet by mouth every 6 (six) hours as needed for moderate pain.  30 tablet  0  . insulin glargine (LANTUS) 100 UNIT/ML injection Inject 40 Units into the skin daily.      Marland Kitchen. levalbuterol (XOPENEX) 0.63 MG/3ML nebulizer solution Take 1 ampule by  nebulization every 6 (six) hours as needed. For wheezing      . levothyroxine (SYNTHROID) 88 MCG tablet Take 1 tablet (88 mcg total) by mouth daily.  90 tablet  3  . Memantine HCl ER 28 MG CP24 Take 28 mg by mouth daily.  90 capsule  3  . metFORMIN (GLUCOPHAGE) 1000 MG tablet Take 1,000 mg by mouth 2 (two) times daily with a meal.      . Multiple Vitamin (MULTIVITAMIN WITH MINERALS) TABS tablet Take 1 tablet by mouth daily.      . pravastatin (PRAVACHOL) 20 MG tablet Take 1 tablet (20 mg total) by mouth every evening.  30 tablet  11  . tiotropium (SPIRIVA) 18 MCG inhalation capsule Place 18 mcg into inhaler and inhale daily.      . valsartan (DIOVAN) 160 MG tablet Take 1 tablet (160 mg total) by mouth daily.  30 tablet  5  . vitamin A 8000 UNIT capsule  Take 8,000 Units by mouth daily.       No facility-administered medications prior to visit.    PAST MEDICAL HISTORY: Past Medical History  Diagnosis Date  . Personal history of colonic polyps   . Iron deficiency anemia, unspecified   . Other malaise and fatigue   . Chest pain, unspecified   . Other B-complex deficiencies   . Arthritis     Right Sacroiliac joint  . Other and unspecified hyperlipidemia   . Anxiety and depression   . HTN (hypertension)   . Unspecified hypothyroidism   . Type II or unspecified type diabetes mellitus without mention of complication, not stated as uncontrolled   . Allergic rhinitis, cause unspecified   . Unspecified asthma(493.90)   . Chronic airway obstruction, not elsewhere classified   . Abnormal stress test 08/21/2008    Neg  . GERD (gastroesophageal reflux disease)     PAST SURGICAL HISTORY: Past Surgical History  Procedure Laterality Date  . Foot surgery      hammer toe 2004, 2005  . Abdominal hysterectomy  1982    menometorrhagia  . Carpal tunnel release  03/05/2009    bilateral  . Colonoscopy N/A 09/06/2013    Procedure: COLONOSCOPY;  Surgeon: Louis Meckel, MD;  Location: [facility];   Service: Endoscopy;  Laterality: N/A;  . Hot hemostasis N/A 09/06/2013    Procedure: HOT HEMOSTASIS (ARGON PLASMA COAGULATION/BICAP);  Surgeon: Louis Meckel, MD;  Location: [facility];  Service: Endoscopy;  Laterality: N/A;    FAMILY HISTORY: Family History  Problem Relation Age of Onset  . Hypertension Mother   . Diabetes Mother   . Heart disease Mother     CHF  . Coronary artery disease Mother   . Lung cancer Father   . Hypertension Father   . Diabetes Father   . Liver cancer Father     w/mets  . Prostate cancer Father   . Breast cancer Sister   . Lung disease Brother     Agent Orange Related  . Asthma Brother   . Allergies Brother   . Colon cancer Neg Hx     SOCIAL HISTORY: History   Social History  . Marital Status: Legally Separated    Spouse Name: N/A    Number of Children: 6  . Years of Education: 10   Occupational History  .     Social History Main Topics  . Smoking status: Former Smoker    Quit date: 06/01/1997  . Smokeless tobacco: Never Used     Comment: Quit 9 years ago.  . Alcohol Use: No  . Drug Use: No  . Sexual Activity: Not on file   Other Topics Concern  . 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 No feather bedding, no mold. Son smokes. Pets including dogs, cats, 3 birds.    Angioedema with Aspirin.   Daily caffeine use two cups a day   Regular Exercise-yes               PHYSICAL EXAM  Filed Vitals:   07/17/14 1024  BP:  144/77  Pulse: 56  Height: 5\' 3"  (1.6 m)  Weight: 180 lb (81.647 kg)   Body mass index is 31.89 kg/(m^2). Generalized: Well  developed, in no acute distress   Neurological examination  Mentation: Alert oriented to time, place, history taking. Follows all commands speech and language fluent. MMSE 22/30  Cranial nerve II-XII: Pupils were equal round reactive to light. Extraocular movements were full, visual field were full on confrontational test. Facial sensation and strength were normal. Uvula tongue midline. Head turning and shoulder shrug  were normal and symmetric. Motor: The motor testing reveals 5 over 5 strength of all 4 extremities. Good symmetric motor tone is noted throughout.  Sensory: Sensory testing is intact to soft touch on all 4 extremities. No evidence of extinction is noted.  Coordination: Cerebellar testing reveals good finger-nose-finger and heel-to-shin bilaterally.  Gait and station: Valgus knees. Patient tends to be her to the right when [name] is walking. [name] does not use any assistive device. Tandem gait not attempted. Romberg is negative. No drift is seen.  Reflexes: Deep tendon reflexes are symmetric and normal bilaterally.    DIAGNOSTIC DATA (LABS, IMAGING, TESTING) - I reviewed patient records, labs, notes, testing and imaging myself where available.  Lab Results  Component Value Date   WBC 6.8 02/08/2014   HGB 13.4 02/08/2014   HCT 39.5 02/08/2014   MCV 92.8 02/08/2014   PLT 220.0 02/08/2014      Component Value Date/Time   NA 142 05/11/2014 1220   K 4.1 05/11/2014 1220   CL 106 05/11/2014 1220   CO2 29 05/11/2014 1220   GLUCOSE 84 05/11/2014 1220   BUN 20 05/11/2014 1220   CREATININE 1.0 05/11/2014 1220   CALCIUM 10.4 05/11/2014 1220   PROT 6.3 05/21/2014 0934   ALBUMIN 3.6 05/21/2014 0934   AST 20 05/21/2014 0934   ALT 11 05/21/2014 0934   ALKPHOS 50 05/21/2014 0934   BILITOT 0.6 05/21/2014 0934   GFRNONAA 58* 09/23/2013 1003   GFRAA 67* 09/23/2013 1003   Lab Results  Component Value Date   CHOL 119 05/21/2014   HDL 38.20* 05/21/2014   LDLCALC 50 05/21/2014   LDLDIRECT 130.4 11/02/2013     TRIG 156.0* 05/21/2014   CHOLHDL 3 05/21/2014   Lab Results  Component Value Date   HGBA1C 6.5 05/11/2014   Lab Results  Component Value Date   VITAMINB12 245 11/02/2013   Lab Results  Component Value Date   TSH 1.84 02/08/2014      ASSESSMENT AND PLAN 68 y.o. year old female  has a past medical history of Personal history of colonic polyps; Iron deficiency anemia, unspecified; Other malaise and fatigue; Chest pain, unspecified; Other B-complex deficiencies; Arthritis; Other and unspecified hyperlipidemia; Anxiety and depression; HTN (hypertension); Unspecified hypothyroidism; Type II or unspecified type diabetes mellitus without mention of complication, not stated as uncontrolled; Allergic rhinitis, cause unspecified; Unspecified asthma(493.90); Chronic airway obstruction, not elsewhere classified; Abnormal stress test (08/21/2008); and GERD (gastroesophageal reflux disease). here with:   1. Memory loss 2. Abnormality of gait   Patient feels that her memory has remained stable. Her daughter in law states that [name]'s not really notice a drastic decrease in her memory since the last visit. [name] did score lower on the MMSE today. [name] is currently taken Namenda and Aricept. I advised her to continue taking this medication. Patient has been having a mild headache I advised her to use over-the-counter medication. Patient is inquiring about an MRI of the brain.  Her physical exam was unremarkable. At this time I don't feel that an MRI is warranted. It seems that the patient wants a MRI to prove to family members that [name] does not have  dementia. I have explained that an MRI would not definitively show dementia. Patient and daughter in law verbalized understanding. Patient should followup in 4 months or sooner if needed.   Butch Penny, MSN, NP-C 07/17/2014, 10:47 AM [facility] [address] [place], [place] [address] [phone]  Note: This document was prepared  with digital dictation and possible smart phrase technology. Any transcriptional errors that result from this process are unintentional.

## 2014-08-10 ENCOUNTER — Telehealth: Payer: Self-pay | Admitting: *Deleted

## 2014-08-10 NOTE — Telephone Encounter (Signed)
VM left for pt about BP check f/u nurse visit for DB

## 2014-08-14 ENCOUNTER — Other Ambulatory Visit: Payer: Self-pay | Admitting: Internal Medicine

## 2014-08-14 NOTE — Telephone Encounter (Signed)
Pt called left msg on triage needing refill on her hydrocodone...[name]

## 2014-08-17 ENCOUNTER — Other Ambulatory Visit: Payer: Self-pay

## 2014-08-17 MED ORDER — CLONAZEPAM 1 MG PO TABS: 1.0000 mg | ORAL_TABLET | Freq: Every evening | ORAL | Status: DC | PRN

## 2014-08-17 NOTE — Telephone Encounter (Signed)
Pt daughter in law called again about the the refill on the HYDROcodone-acetaminophen (NORCO/VICODIN) 5-325 MG per tablet [[id number]]

## 2014-08-19 MED ORDER — HYDROCODONE-ACETAMINOPHEN 5-325 MG PO TABS: 1.0000 | ORAL_TABLET | Freq: Four times a day (QID) | ORAL | Status: DC | PRN

## 2014-08-19 NOTE — Telephone Encounter (Signed)
sch ov pls 

## 2014-08-20 ENCOUNTER — Telehealth: Payer: Self-pay | Admitting: Internal Medicine

## 2014-08-20 MED ORDER — AMOXICILLIN-POT CLAVULANATE 875-125 MG PO TABS: 1.0000 | ORAL_TABLET | Freq: Two times a day (BID) | ORAL | Status: DC

## 2014-08-20 MED ORDER — TIOTROPIUM BROMIDE MONOHYDRATE 18 MCG IN CAPS: 18.0000 ug | ORAL_CAPSULE | Freq: Every day | RESPIRATORY_TRACT | Status: DC

## 2014-08-20 NOTE — Telephone Encounter (Signed)
Spoke with [place] and notified of recs per CDY She verbalized understanding  Rx was sent to pharm

## 2014-08-20 NOTE — Telephone Encounter (Signed)
Ok augmentin 875 # 14, 1 twice daily 

## 2014-08-20 NOTE — Telephone Encounter (Signed)
Called spoke with [place]. She reports pt is c/o temp 99.0, sinus HA, sinus pressure, nasal congestion, PND,prod cough-yellow phlem, blowing out yellow phlem. Requesting augmentin. Please advise thanks  Allergies  Allergen Reactions  . Aspirin Itching  . Codeine Itching  . Demerol Itching    Unknown-unconscious.  . Latex Itching  . Lipitor [Atorvastatin]     Urticaria   . Lisinopril Itching and Cough    REACTION: causes cough  . Zocor [Simvastatin]     Urticaria      Current Outpatient Prescriptions on File Prior to Visit  Medication Sig Dispense Refill  . ACCU-CHEK FASTCLIX LANCETS MISC USE AS DIRECTED  102 each  11  . albuterol (PROAIR HFA) 108 (90 BASE) MCG/ACT inhaler Inhale 2 puffs into the lungs every 6 (six) hours as needed for wheezing or shortness of breath.  1 Inhaler  prn  . azelastine (ASTELIN) 0.1 % nasal spray Place 2 sprays into both nostrils 2 (two) times daily. Use in each nostril as directed      . BD ECLIPSE SYRINGE 25G X 1" 3 ML MISC Use to administer insulin daily  100 each  3  . budesonide-formoterol (SYMBICORT) 80-4.5 MCG/ACT inhaler Inhale 2 puffs into the lungs 2 (two) times daily.      . clonazePAM (KLONOPIN) 1 MG tablet TAKE 1 TO 2 TABLETS BY MOUTH AT BEDTIME AS NEEDED FOR ANXIETY  60 tablet  0  . clonazePAM (KLONOPIN) 1 MG tablet Take 1-2 tablets (1-2 mg total) by mouth at bedtime as needed for anxiety.  60 tablet  5  . conjugated estrogens (PREMARIN) vaginal cream Place 1 Applicatorful vaginally 2 (two) times daily.      Marland Kitchen donepezil (ARICEPT) 10 MG tablet Take 1 tablet (10 mg total) by mouth at bedtime.  30 tablet  12  . doxycycline (VIBRAMYCIN) 100 MG capsule Take 2 today then 1 daily until gone  8 capsule  0  . esomeprazole (NEXIUM) 40 MG capsule Take 40 mg by mouth daily at 12 noon.      . ezetimibe (ZETIA) 10 MG tablet Take 10 mg by mouth daily.      . fish oil-omega-3 fatty acids 1000 MG capsule Take 4,000 mg by mouth daily.       . furosemide  (LASIX) 40 MG tablet Take 40 mg by mouth daily.      Marland Kitchen glucose blood (ACCU-CHEK AVIVA PLUS) test strip Use three to four times daily as instructed. Dx: 250.01  100 each  5  . HYDROcodone-acetaminophen (NORCO/VICODIN) 5-325 MG per tablet Take 1 tablet by mouth every 6 (six) hours as needed for moderate pain.  30 tablet  0  . insulin glargine (LANTUS) 100 UNIT/ML injection Inject 40 Units into the skin daily.      Marland Kitchen levalbuterol (XOPENEX) 0.63 MG/3ML nebulizer solution Take 1 ampule by nebulization every 6 (six) hours as needed. For wheezing      . levothyroxine (SYNTHROID) 88 MCG tablet Take 1 tablet (88 mcg total) by mouth daily.  90 tablet  3  . Memantine HCl ER 28 MG CP24 Take 28 mg by mouth daily.  90 capsule  3  . metFORMIN (GLUCOPHAGE) 1000 MG tablet Take 1,000 mg by mouth 2 (two) times daily with a meal.      . Multiple Vitamin (MULTIVITAMIN WITH MINERALS) TABS tablet Take 1 tablet by mouth daily.      . pravastatin (PRAVACHOL) 20 MG tablet Take 1 tablet (20 mg total) by mouth every  evening.  30 tablet  11  . valsartan (DIOVAN) 160 MG tablet Take 1 tablet (160 mg total) by mouth daily.  30 tablet  5  . vitamin A 8000 UNIT capsule Take 8,000 Units by mouth daily.      . [DISCONTINUED] Calcium Carbonate-Vitamin D (CALCIUM 600/VITAMIN D) 600-400 MG-UNIT per tablet Take 1 tablet by mouth 2 (two) times daily.        . [DISCONTINUED] sodium chloride (OCEAN) 0.65 % nasal spray Place 2 sprays into the nose as needed. TO OPEN NASAL BREATHING       No current facility-administered medications on file prior to visit.

## 2014-08-22 ENCOUNTER — Telehealth: Payer: Self-pay | Admitting: Pulmonary Disease

## 2014-08-22 NOTE — Telephone Encounter (Signed)
Pt called office 9/21 to have Spiriva inhaler refilled. Pharmacy never received order for this  Spiriva - one inhaler, one inhalation daily, RF X 6  Billy Fischer, MD ; [facility] [phone].  After 5:30 PM or weekends, call [phone]

## 2014-08-23 ENCOUNTER — Telehealth: Payer: Self-pay | Admitting: Internal Medicine

## 2014-08-23 NOTE — Telephone Encounter (Signed)
error 

## 2014-08-27 NOTE — Telephone Encounter (Signed)
Patient would like hydrocodone refilled.  She has an appointment on Oct 14h and would like the script to get her through until then.  I did notify patient that Dr. Posey Rea was out this week.

## 2014-08-28 MED ORDER — HYDROCODONE-ACETAMINOPHEN 5-325 MG PO TABS: 1.0000 | ORAL_TABLET | Freq: Four times a day (QID) | ORAL | Status: DC | PRN

## 2014-08-28 NOTE — Telephone Encounter (Signed)
[name] informed Rx is upfront for p/u.

## 2014-08-28 NOTE — Telephone Encounter (Signed)
Will refill #30 no refills, signed by my area.

## 2014-09-04 ENCOUNTER — Telehealth: Payer: Self-pay | Admitting: Internal Medicine

## 2014-09-04 MED ORDER — CLINDAMYCIN HCL 150 MG PO CAPS: 600.0000 mg | ORAL_CAPSULE | Freq: Every day | ORAL | Status: DC

## 2014-09-04 NOTE — Telephone Encounter (Signed)
Pt calling again a/b rx.[name]

## 2014-09-04 NOTE — Telephone Encounter (Signed)
Offer cleocin 150 mg 4 x daily x 7 days   # 28  Recommend a probiotic like Florastor or [employer]

## 2014-09-04 NOTE — Telephone Encounter (Signed)
Spoke with pt-- c/o sinus HA, runny nose, green mucus and cough x 1 week. No improvement since last abx given Pt completed Augmentin 875 around 9/29 (BID dose #14 given 08/20/14) Wanting to know CY an call in an abx Allergies  Allergen Reactions  . Aspirin Itching  . Codeine Itching  . Demerol Itching    Unknown-unconscious.  . Latex Itching  . Lipitor [Atorvastatin]     Urticaria   . Lisinopril Itching and Cough    REACTION: causes cough  . Zocor [Simvastatin]     Urticaria    Please advise Dr Maple HudsonYoung, thanks.

## 2014-09-04 NOTE — Addendum Note (Signed)
Addended by: Maisie FusGREEN, Theola Cuellar M on: 09/04/2014 12:12 PM   Modules accepted: Orders

## 2014-09-04 NOTE — Telephone Encounter (Signed)
Pt aware that abx sent to pharmacy--Cleocin 150mg  4 qd x 7 days #28 Nothing further needed.

## 2014-09-05 ENCOUNTER — Emergency Department (HOSPITAL_COMMUNITY): Payer: Medicare Other

## 2014-09-05 ENCOUNTER — Emergency Department (HOSPITAL_COMMUNITY)
Admission: EM | Admit: 2014-09-05 | Discharge: 2014-09-05 | Disposition: A | Discharge status: Home / Self Care | Payer: Medicare Other | Attending: Emergency Medicine | Admitting: Emergency Medicine

## 2014-09-05 ENCOUNTER — Encounter (HOSPITAL_COMMUNITY): Payer: Self-pay | Admitting: Emergency Medicine

## 2014-09-05 DIAGNOSIS — Z87891 Personal history of nicotine dependence: Secondary | ICD-10-CM | POA: Diagnosis not present

## 2014-09-05 DIAGNOSIS — Z91040 Latex allergy status: Secondary | ICD-10-CM | POA: Diagnosis not present

## 2014-09-05 DIAGNOSIS — Z86010 Personal history of colonic polyps: Secondary | ICD-10-CM | POA: Insufficient documentation

## 2014-09-05 DIAGNOSIS — F418 Other specified anxiety disorders: Secondary | ICD-10-CM | POA: Diagnosis not present

## 2014-09-05 DIAGNOSIS — Z862 Personal history of diseases of the blood and blood-forming organs and certain disorders involving the immune mechanism: Secondary | ICD-10-CM | POA: Insufficient documentation

## 2014-09-05 DIAGNOSIS — R0789 Other chest pain: Secondary | ICD-10-CM

## 2014-09-05 DIAGNOSIS — I10 Essential (primary) hypertension: Secondary | ICD-10-CM | POA: Insufficient documentation

## 2014-09-05 DIAGNOSIS — M458 Ankylosing spondylitis sacral and sacrococcygeal region: Secondary | ICD-10-CM | POA: Insufficient documentation

## 2014-09-05 DIAGNOSIS — K219 Gastro-esophageal reflux disease without esophagitis: Secondary | ICD-10-CM | POA: Diagnosis not present

## 2014-09-05 DIAGNOSIS — Z794 Long term (current) use of insulin: Secondary | ICD-10-CM | POA: Insufficient documentation

## 2014-09-05 DIAGNOSIS — E785 Hyperlipidemia, unspecified: Secondary | ICD-10-CM | POA: Insufficient documentation

## 2014-09-05 DIAGNOSIS — Z7951 Long term (current) use of inhaled steroids: Secondary | ICD-10-CM | POA: Diagnosis not present

## 2014-09-05 DIAGNOSIS — J449 Chronic obstructive pulmonary disease, unspecified: Secondary | ICD-10-CM | POA: Diagnosis not present

## 2014-09-05 DIAGNOSIS — M25512 Pain in left shoulder: Secondary | ICD-10-CM | POA: Diagnosis not present

## 2014-09-05 DIAGNOSIS — Z79899 Other long term (current) drug therapy: Secondary | ICD-10-CM | POA: Insufficient documentation

## 2014-09-05 DIAGNOSIS — E039 Hypothyroidism, unspecified: Secondary | ICD-10-CM | POA: Diagnosis not present

## 2014-09-05 DIAGNOSIS — E119 Type 2 diabetes mellitus without complications: Secondary | ICD-10-CM | POA: Insufficient documentation

## 2014-09-05 DIAGNOSIS — R079 Chest pain, unspecified: Secondary | ICD-10-CM | POA: Diagnosis present

## 2014-09-05 LAB — CBC
HEMATOCRIT: 34.6 % — AB (ref 36.0–46.0)
Hemoglobin: 11.8 g/dL — ABNORMAL LOW (ref 12.0–15.0)
MCH: 30.8 pg (ref 26.0–34.0)
MCHC: 34.1 g/dL (ref 30.0–36.0)
MCV: 90.3 fL (ref 78.0–100.0)
PLATELETS: 236 10*3/uL (ref 150–400)
RBC: 3.83 MIL/uL — ABNORMAL LOW (ref 3.87–5.11)
RDW: 12.7 % (ref 11.5–15.5)
WBC: 6.3 10*3/uL (ref 4.0–10.5)

## 2014-09-05 LAB — I-STAT TROPONIN, ED: Troponin i, poc: 0.01 ng/mL (ref 0.00–0.08)

## 2014-09-05 LAB — BASIC METABOLIC PANEL
ANION GAP: 10 (ref 5–15)
BUN: 20 mg/dL (ref 6–23)
CALCIUM: 9.7 mg/dL (ref 8.4–10.5)
CO2: 28 mEq/L (ref 19–32)
CREATININE: 1.12 mg/dL — AB (ref 0.50–1.10)
Chloride: 101 mEq/L (ref 96–112)
GFR calc non Af Amer: 49 mL/min — ABNORMAL LOW (ref >90)
GFR, EST AFRICAN AMERICAN: 56 mL/min — AB (ref >90)
Glucose, Bld: 161 mg/dL — ABNORMAL HIGH (ref 70–99)
Potassium: 4 mEq/L (ref 3.7–5.3)
Sodium: 139 mEq/L (ref 137–147)

## 2014-09-05 MED ORDER — HYDROCODONE-ACETAMINOPHEN 5-325 MG PO TABS: 1.0000 | ORAL_TABLET | Freq: Four times a day (QID) | ORAL | Status: DC | PRN

## 2014-09-05 NOTE — ED Notes (Signed)
Patient transported to X-ray 

## 2014-09-05 NOTE — ED Provider Notes (Signed)
CSN: [id number]     Arrival date & time 09/05/14  1809 History   First MD Initiated Contact with Patient 09/05/14 2005     Chief Complaint  Patient presents with  . Chest Pain     (Consider location/radiation/quality/duration/timing/severity/associated sxs/prior Treatment) HPI  Patient presents with concerns of pain in her left shoulder, also radiating into the left chest. Pain began approximately 2 weeks ago, and additionally the patient denies any precipitant, fall, trauma. Initially, the patient recalls having a influenza vaccine shot in the most painful area, just prior to the onset of pain. Since onset has been constant, not exertional or pleuritic, with radiation towards the left upper chest, distally down his left arm. No loss of sensation, nor strength. No other dyspnea, lightheadedness, syncope, nausea, vomiting, fever, chills. Minimal relief with typical chronic pain medication  Past Medical History  Diagnosis Date  . Personal history of colonic polyps   . Iron deficiency anemia, unspecified   . Other malaise and fatigue   . Chest pain, unspecified   . Other B-complex deficiencies   . Arthritis     Right Sacroiliac joint  . Other and unspecified hyperlipidemia   . Anxiety and depression   . HTN (hypertension)   . Unspecified hypothyroidism   . Type II or unspecified type diabetes mellitus without mention of complication, not stated as uncontrolled   . Allergic rhinitis, cause unspecified   . Unspecified asthma(493.90)   . Chronic airway obstruction, not elsewhere classified   . Abnormal stress test 08/21/2008    Neg  . GERD (gastroesophageal reflux disease)    Past Surgical History  Procedure Laterality Date  . Foot surgery      hammer toe 2004, 2005  . Abdominal hysterectomy  1982    menometorrhagia  . Carpal tunnel release  03/05/2009    bilateral  . Colonoscopy N/A 09/06/2013    Procedure: COLONOSCOPY;  Surgeon: Louis Meckel, MD;  Location: [facility]  ENDOSCOPY;  Service: Endoscopy;  Laterality: N/A;  . Hot hemostasis N/A 09/06/2013    Procedure: HOT HEMOSTASIS (ARGON PLASMA COAGULATION/BICAP);  Surgeon: Louis Meckel, MD;  Location: [facility];  Service: Endoscopy;  Laterality: N/A;   Family History  Problem Relation Age of Onset  . Hypertension Mother   . Diabetes Mother   . Heart disease Mother     CHF  . Coronary artery disease Mother   . Lung cancer Father   . Hypertension Father   . Diabetes Father   . Liver cancer Father     w/mets  . Prostate cancer Father   . Breast cancer Sister   . Lung disease Brother     Agent Orange Related  . Asthma Brother   . Allergies Brother   . Colon cancer Neg Hx    History  Substance Use Topics  . Smoking status: Former Smoker    Quit date: 06/01/1997  . Smokeless tobacco: Never Used     Comment: Quit 9 years ago.  . Alcohol Use: No   OB History   Grav Para Term Preterm Abortions TAB SAB Ect Mult Living                 Review of Systems  Constitutional:       Per HPI, otherwise negative  HENT:       Per HPI, otherwise negative  Respiratory:       Per HPI, otherwise negative  Cardiovascular:       Per HPI, otherwise negative  Gastrointestinal: Negative for vomiting.  Endocrine:       Negative aside from HPI  Genitourinary:       Neg aside from HPI   Musculoskeletal:       Per HPI, otherwise negative  Skin: Negative.   Neurological: Negative for syncope.      Allergies  Aspirin; Codeine; Demerol; Latex; Lipitor; Lisinopril; and Zocor  Home Medications   Prior to Admission medications   Medication Sig Start Date End Date Taking? Authorizing Provider  albuterol (PROVENTIL HFA;VENTOLIN HFA) 108 (90 BASE) MCG/ACT inhaler Inhale 2 puffs into the lungs every 6 (six) hours as needed for wheezing or shortness of breath.   Yes Historical Provider, MD  azelastine (ASTELIN) 0.1 % nasal spray Place 2 sprays into both nostrils 2 (two) times daily. Use in each nostril as  directed   Yes Historical Provider, MD  budesonide-formoterol (SYMBICORT) 80-4.5 MCG/ACT inhaler Inhale 2 puffs into the lungs 2 (two) times daily.   Yes Historical Provider, MD  clindamycin (CLEOCIN) 150 MG capsule Take 600 mg by mouth daily. x7 days   Yes Historical Provider, MD  clonazePAM (KLONOPIN) 1 MG tablet Take 1-2 mg by mouth at bedtime.   Yes Historical Provider, MD  conjugated estrogens (PREMARIN) vaginal cream Place 1 Applicatorful vaginally 2 (two) times daily as needed (vaginal itching).    Yes Historical Provider, MD  donepezil (ARICEPT) 10 MG tablet Take 10 mg by mouth at bedtime.   Yes Historical Provider, MD  esomeprazole (NEXIUM) 40 MG capsule Take 40 mg by mouth daily at 12 noon.   Yes Historical Provider, MD  ezetimibe (ZETIA) 10 MG tablet Take 10 mg by mouth daily.   Yes Historical Provider, MD  fish oil-omega-3 fatty acids 1000 MG capsule Take 4,000 mg by mouth daily.    Yes Historical Provider, MD  furosemide (LASIX) 40 MG tablet Take 40 mg by mouth daily.   Yes Historical Provider, MD  HYDROcodone-acetaminophen (NORCO/VICODIN) 5-325 MG per tablet Take 1 tablet by mouth every 6 (six) hours as needed for moderate pain.   Yes Historical Provider, MD  insulin glargine (LANTUS) 100 UNIT/ML injection Inject 40 Units into the skin daily.   Yes Historical Provider, MD  levalbuterol Pauline Aus(XOPENEX) 0.63 MG/3ML nebulizer solution Take 1 ampule by nebulization every 6 (six) hours as needed. For wheezing 11/28/12  Yes Waymon Budgelinton D Young, MD  levothyroxine (SYNTHROID, LEVOTHROID) 88 MCG tablet Take 88 mcg by mouth daily before breakfast.   Yes Historical Provider, MD  Memantine HCl ER (NAMENDA XR) 28 MG CP24 Take 28 mg by mouth daily.   Yes Historical Provider, MD  metFORMIN (GLUCOPHAGE) 1000 MG tablet Take 1,000 mg by mouth 2 (two) times daily with a meal.   Yes Historical Provider, MD  Multiple Vitamin (MULTIVITAMIN WITH MINERALS) TABS tablet Take 1 tablet by mouth daily.   Yes Historical  Provider, MD  pravastatin (PRAVACHOL) 20 MG tablet Take 20 mg by mouth every evening.   Yes Historical Provider, MD  tiotropium (SPIRIVA) 18 MCG inhalation capsule Place 18 mcg into inhaler and inhale daily.   Yes Historical Provider, MD  valsartan (DIOVAN) 160 MG tablet Take 160 mg by mouth daily.   Yes Historical Provider, MD  vitamin A 8000 UNIT capsule Take 8,000 Units by mouth daily.   Yes Historical Provider, MD   BP 163/105  Pulse 70  Temp(Src) 98.8 F (37.1 C) (Oral)  Resp 18  SpO2 96% Physical Exam  Nursing note and vitals reviewed. Constitutional: She is  oriented to person, place, and time. She appears well-developed and well-nourished. No distress.  HENT:  Head: Normocephalic and atraumatic.  Eyes: Conjunctivae and EOM are normal.  Cardiovascular: Normal rate and regular rhythm.   Pulmonary/Chest: Effort normal and breath sounds normal. No stridor. No respiratory distress.  Abdominal: She exhibits no distension.  Musculoskeletal: She exhibits no edema.  Patient is appropriate range of motion of the elbow and wrist on the ipsilateral side, left shoulder has limited with flexion, abduction secondary to pain in the superior lateral edge, no deformity, no crepitus.  Neurological: She is alert and oriented to person, place, and time. No cranial nerve deficit.  Skin: Skin is warm and dry.  Psychiatric: She has a normal mood and affect.    ED Course  Procedures (including critical care time) Labs Review Labs Reviewed  CBC - Abnormal; Notable for the following:    RBC 3.83 (*)    Hemoglobin 11.8 (*)    HCT 34.6 (*)    All other components within normal limits  BASIC METABOLIC PANEL - Abnormal; Notable for the following:    Glucose, Bld 161 (*)    Creatinine, Ser 1.12 (*)    GFR calc non Af Amer 49 (*)    GFR calc Af Amer 56 (*)    All other components within normal limits  I-STAT TROPOININ, ED    Imaging Review Dg Chest 2 View  09/05/2014   CLINICAL DATA:  Left arm  and chest pain for 2 weeks, worsening. No known injury.  EXAM: CHEST  2 VIEW  COMPARISON:  PA and lateral chest 05/04/2014 and 05/20/2013.  FINDINGS: The lungs are clear. Heart size is normal. No pneumothorax or pleural effusion.  IMPRESSION: No acute disease.   Electronically Signed   By: Drusilla Kanner M.D.   On: 09/05/2014 20:16   Dg Shoulder Left  09/05/2014   CLINICAL DATA:  Lateral left shoulder pain for 3 weeks. No known injury.  EXAM: LEFT SHOULDER - 2+ VIEW  COMPARISON:  None.  FINDINGS: No acute bony or joint abnormality is identified. Mild acromioclavicular degenerative change is seen. Imaged left lung and ribs appear normal.  IMPRESSION: No acute finding.   Electronically Signed   By: Drusilla Kanner M.D.   On: 09/05/2014 22:20     EKG Interpretation   Date/Time:  Wednesday September 05 2014 18:37:26 EDT Ventricular Rate:  60 PR Interval:  178 QRS Duration: 124 QT Interval:  436 QTC Calculation: 436 R Axis:   -55 Text Interpretation:  Normal sinus rhythm with sinus arrhythmia Right  bundle branch block Left anterior fascicular block  Bifascicular block   Moderate voltage criteria for LVH, may be normal variant Cannot rule  out Septal infarct , age undetermined Lateral infarct , age undetermined  Abnormal ECG Sinus rhythm Sinus arrhythmia Artifact Non-specific  intra-ventricular conduction delay Abnormal ekg Confirmed by Gerhard Munch  MD [phone]) on 09/05/2014 8:32:11 PM     10:51 PM The patient is in no distress, no CP, only L lateral shoulder pain.  We discussed all findings, her reassuring results, x-rays, and that the patient will be discharged to follow up with her orthopedist, which she states that she can no MDM    this patient presents with weeks of ongoing left shoulder pain, with occasional radiation to the chest.  Patient has no exertional nor pleuritic pain, is awake and alert, with pain during motion of the L shoulder. Patient's evaluation does not suggest  atypical ACS, thromboembolic  processes, infection or other acute etiology. Patient will follow up with her orthopedist, primary care physician   Gerhard Munch, MD 09/05/14 2256

## 2014-09-05 NOTE — ED Notes (Signed)
Family member reported during assessment that the patient had a flu shot in the left shoulder around the same time that the pain was initially reported. Per patient shot was given high on the shoulder. The area the patient pointed to was above the deltoid.

## 2014-09-05 NOTE — Discharge Instructions (Signed)
As discussed, your evaluation today has been largely reassuring.  But, it is important that you monitor your condition carefully, and do not hesitate to return to the ED if you develop new, or concerning changes in your condition. ? ?Otherwise, please follow-up with your physician for appropriate ongoing care. ? ?

## 2014-09-05 NOTE — ED Notes (Signed)
Pt reports pain to L arm x 1 week radiating into chest, abdomen and back. Denies injury, sob, nv.

## 2014-09-12 ENCOUNTER — Encounter: Payer: Self-pay | Admitting: Internal Medicine

## 2014-09-12 ENCOUNTER — Other Ambulatory Visit (INDEPENDENT_AMBULATORY_CARE_PROVIDER_SITE_OTHER): Payer: Medicare Other

## 2014-09-12 ENCOUNTER — Ambulatory Visit (INDEPENDENT_AMBULATORY_CARE_PROVIDER_SITE_OTHER): Payer: Medicare Other | Admitting: Internal Medicine

## 2014-09-12 VITALS — BP 132/90 | HR 58 | Temp 97.5°F | Wt 188.0 lb

## 2014-09-12 DIAGNOSIS — F341 Dysthymic disorder: Secondary | ICD-10-CM

## 2014-09-12 DIAGNOSIS — I10 Essential (primary) hypertension: Secondary | ICD-10-CM

## 2014-09-12 DIAGNOSIS — E538 Deficiency of other specified B group vitamins: Secondary | ICD-10-CM

## 2014-09-12 DIAGNOSIS — E119 Type 2 diabetes mellitus without complications: Secondary | ICD-10-CM

## 2014-09-12 LAB — BASIC METABOLIC PANEL
BUN: 20 mg/dL (ref 6–23)
CALCIUM: 10.4 mg/dL (ref 8.4–10.5)
CO2: 32 mEq/L (ref 19–32)
CREATININE: 1.1 mg/dL (ref 0.4–1.2)
Chloride: 101 mEq/L (ref 96–112)
GFR: 49.99 mL/min — ABNORMAL LOW (ref >60.00)
GLUCOSE: 110 mg/dL — AB (ref 70–99)
Potassium: 4.5 mEq/L (ref 3.5–5.1)
Sodium: 138 mEq/L (ref 135–145)

## 2014-09-12 LAB — HEMOGLOBIN A1C: HEMOGLOBIN A1C: 6.1 % (ref 4.6–6.5)

## 2014-09-12 MED ORDER — HYDROCODONE-ACETAMINOPHEN 5-325 MG PO TABS: 1.0000 | ORAL_TABLET | Freq: Four times a day (QID) | ORAL | Status: DC | PRN

## 2014-09-12 NOTE — Progress Notes (Signed)
   Subjective:    HPI   C/o pain in L shoulder that started in 1 d after a flu shot on Sept 11  The patient presents for a follow-up of  chronic hypertension, chronic dyslipidemia, type 2 diabetes controlled with medicines   Wt Readings from Last 3 Encounters:  09/12/14 188 lb (85.276 kg)  07/17/14 180 lb (81.647 kg)  05/30/14 184 lb (83.462 kg)   BP Readings from Last 3 Encounters:  09/12/14 132/90  09/05/14 163/105  07/17/14 144/77      Review of Systems  Constitutional: Positive for unexpected weight change. Negative for chills, activity change, appetite change and fatigue.  HENT: Negative for congestion, mouth sores and sinus pressure.   Eyes: Negative for visual disturbance.  Respiratory: Negative for cough and chest tightness.   Gastrointestinal: Negative for nausea, vomiting and abdominal pain.  Genitourinary: Negative for urgency, frequency, hematuria, flank pain, difficulty urinating and vaginal pain.  Musculoskeletal: Negative for back pain and gait problem.  Skin: Negative for pallor, rash and wound.  Neurological: Positive for tremors. Negative for dizziness, weakness, numbness and headaches.  Psychiatric/Behavioral: Positive for sleep disturbance. Negative for suicidal ideas and confusion. The patient is nervous/anxious.        Objective:   Physical Exam  Constitutional: She appears well-developed. No distress.  HENT:  Head: Normocephalic.  Right Ear: External ear normal.  Left Ear: External ear normal.  Nose: Nose normal.  Mouth/Throat: Oropharynx is clear and moist.  Eyes: Conjunctivae are normal. Pupils are equal, round, and reactive to light. Right eye exhibits no discharge. Left eye exhibits no discharge.  Neck: Normal range of motion. Neck supple. No JVD present. No tracheal deviation present. No thyromegaly present.  Cardiovascular: Normal rate, regular rhythm and normal heart sounds.   Pulmonary/Chest: No stridor. No respiratory distress. She has  no wheezes.  Abdominal: Soft. Bowel sounds are normal. She exhibits no distension and no mass. There is no tenderness. There is no rebound and no guarding.  Musculoskeletal: She exhibits no edema and no tenderness.  Lymphadenopathy:    She has no cervical adenopathy.  Neurological: She displays normal reflexes. No cranial nerve deficit. She exhibits normal muscle tone. Coordination abnormal.  head tremor ++ Hands tremor +   Skin: No rash noted. No erythema.  Psychiatric: She has a normal mood and affect. Her behavior is normal. Judgment and thought content normal.     Lab Results  Component Value Date   WBC 6.3 09/05/2014   HGB 11.8* 09/05/2014   HCT 34.6* 09/05/2014   PLT 236 09/05/2014   GLUCOSE 161* 09/05/2014   CHOL 119 05/21/2014   TRIG 156.0* 05/21/2014   HDL 38.20* 05/21/2014   LDLDIRECT 130.4 11/02/2013   LDLCALC 50 05/21/2014   ALT 11 05/21/2014   AST 20 05/21/2014   NA 139 09/05/2014   K 4.0 09/05/2014   CL 101 09/05/2014   CREATININE 1.12* 09/05/2014   BUN 20 09/05/2014   CO2 28 09/05/2014   TSH 1.84 02/08/2014   HGBA1C 6.5 05/11/2014   MICROALBUR 6.3* 05/10/2013        Assessment & Plan:

## 2014-09-12 NOTE — Progress Notes (Signed)
Pre visit review using our clinic review tool, if applicable. No additional management support is needed unless otherwise documented below in the visit note. 

## 2014-09-13 ENCOUNTER — Other Ambulatory Visit: Payer: Self-pay | Admitting: Internal Medicine

## 2014-09-18 ENCOUNTER — Telehealth: Payer: Self-pay | Admitting: *Deleted

## 2014-09-18 NOTE — Telephone Encounter (Signed)
[facility] Triage Call Report Triage Record Num: [id number] Operator: Darrow Bussingavia Foster Patient Name: [name] Call Date & Time: 09/14/2014 10:37:13PM Patient Phone: [phone] PCP: Sonda PrimesAlex Plotnikov Patient Gender: Female PCP Fax : [phone] Patient DOB: 04/23/1946 Practice Name: [facility] Reason for Call: Caller: [name]/Other; PCP: Sonda PrimesPlotnikov, Alex (Adults only); CB#: [phone]; Call regarding High Blood Sugar onset 09/14/2014; Caller states tonight patient went out to eat and consumed a large amount of carbohydrates- breads, cakes, steaks, starches, etc. Caller states pt returned home and @ 2030- Blood Sugar-475, @ 2100- Blood Sugar 460, and @ 2235- Blood Sugar- 488. Caller states pt got home and took Lantus- 50 units as prescribed. Pt is drinking fluids- water. Patient is afebrile. Caller denies emergent symptoms per patient. Advised to see Provider within 24 hours per Diabetes: Control Problems due to " All other situations." Appointment offered, caller declined and states she will re-evaluate blood sugar in the am, and follow up if necessary. Caller states she will follow-up with the office on 09/17/2014. Caller verbalized understanding of care advice and call-back parameters. Protocol(s) Used: Diabetes: Control Problems Recommended Outcome per Protocol: See Provider within 24 hours Reason for Outcome: All other situations Care Advice: Follow the usual meal plan if possible. Drink extra non-caloric fluids. If unable to eat at all, drink regular soft drinks and juices so that 50 grams of carbohydrates are taken in every 3 to 4 hours. ~ Diabetics should not take ibuprofen unless approved by a provider. Drugs like acetaminophen or aspirin can be used for pain or fever if they are part of the sick day action plan. ~ ~ SYMPTOM / CONDITION MANAGEMENT Call provider within 24 hours if blood sugar is 250 mg/dl two times in a row, or if most blood sugars are 250 mg/dl for more  than 3 days. Call provider immediately if urine ketones are large (3+ to 4+). ~ High Blood Sugar Treatment: - Follow action plan. - Adjust medications if instructed to do so in action plan or by provider. - Test blood sugar and ketones more often. - Record blood sugar and ketones in meter log or separate logbook, and take to all provider visits. - Drink extra water and other non-sugar fluids to prevent dehydration when the blood sugar is high and urine ketones are present. ~ 09/14/2014 11:12:25PM Page 1 of 1 [id number]

## 2014-09-23 NOTE — Assessment & Plan Note (Signed)
Continue with current prescription therapy as reflected on the Med list.  

## 2014-10-05 ENCOUNTER — Encounter: Payer: Self-pay | Admitting: Internal Medicine

## 2014-10-12 ENCOUNTER — Other Ambulatory Visit: Payer: Self-pay

## 2014-10-12 MED ORDER — BUDESONIDE-FORMOTEROL FUMARATE 80-4.5 MCG/ACT IN AERO: 2.0000 | INHALATION_SPRAY | Freq: Two times a day (BID) | RESPIRATORY_TRACT | Status: DC

## 2014-10-17 ENCOUNTER — Telehealth: Payer: Self-pay | Admitting: Internal Medicine

## 2014-10-17 NOTE — Telephone Encounter (Signed)
Requesting script for hydrocodone  °

## 2014-10-18 NOTE — Telephone Encounter (Signed)
OK to fill this prescription with additional refills x0 Thank you!  

## 2014-10-19 MED ORDER — HYDROCODONE-ACETAMINOPHEN 5-325 MG PO TABS: 1.0000 | ORAL_TABLET | Freq: Four times a day (QID) | ORAL | Status: DC | PRN

## 2014-10-19 NOTE — Telephone Encounter (Signed)
Notified pt rx ready for pick-up.../lmb 

## 2014-10-23 ENCOUNTER — Other Ambulatory Visit: Payer: Self-pay | Admitting: Internal Medicine

## 2014-10-23 ENCOUNTER — Telehealth: Payer: Self-pay | Admitting: Internal Medicine

## 2014-10-23 MED ORDER — AMOXICILLIN-POT CLAVULANATE 875-125 MG PO TABS: 1.0000 | ORAL_TABLET | Freq: Two times a day (BID) | ORAL | Status: DC

## 2014-10-23 NOTE — Telephone Encounter (Signed)
Pt c/o prod cough (yellow), sinus drainage (yellow), headaches, facial pain, low grade temp for about one week.  Requesting Augmentin.  Please advise  Allergies  Allergen Reactions  . Aspirin Itching  . Codeine Itching  . Demerol Itching    Unknown-unconscious.  . Latex Itching  . Lipitor [Atorvastatin]     Urticaria   . Lisinopril Itching and Cough    REACTION: causes cough  . Zocor [Simvastatin]     Urticaria     Current Outpatient Prescriptions on File Prior to Visit  Medication Sig Dispense Refill  . albuterol (PROVENTIL HFA;VENTOLIN HFA) 108 (90 BASE) MCG/ACT inhaler Inhale 2 puffs into the lungs every 6 (six) hours as needed for wheezing or shortness of breath.    Marland Kitchen. azelastine (ASTELIN) 0.1 % nasal spray Place 2 sprays into both nostrils 2 (two) times daily. Use in each nostril as directed    . budesonide-formoterol (SYMBICORT) 80-4.5 MCG/ACT inhaler Inhale 2 puffs into the lungs 2 (two) times daily. 1 Inhaler 1  . clindamycin (CLEOCIN) 150 MG capsule Take 600 mg by mouth daily. x7 days    . clonazePAM (KLONOPIN) 1 MG tablet TAKE 1-2 TABLETS BY MOUTH EVERY NIGHT AT BEDTIME AS NEEDED FOR ANXIETY 60 tablet 0  . conjugated estrogens (PREMARIN) vaginal cream Place 1 Applicatorful vaginally 2 (two) times daily as needed (vaginal itching).     Marland Kitchen. donepezil (ARICEPT) 10 MG tablet Take 10 mg by mouth at bedtime.    Marland Kitchen. esomeprazole (NEXIUM) 40 MG capsule Take 40 mg by mouth daily at 12 noon.    . ezetimibe (ZETIA) 10 MG tablet Take 10 mg by mouth daily.    . fish oil-omega-3 fatty acids 1000 MG capsule Take 4,000 mg by mouth daily.     . furosemide (LASIX) 40 MG tablet Take 40 mg by mouth daily.    Marland Kitchen. HYDROcodone-acetaminophen (NORCO/VICODIN) 5-325 MG per tablet Take 1 tablet by mouth every 6 (six) hours as needed for moderate pain. 120 tablet 0  . insulin glargine (LANTUS) 100 UNIT/ML injection Inject 40 Units into the skin daily.    Marland Kitchen. levalbuterol (XOPENEX) 0.63 MG/3ML nebulizer  solution Take 1 ampule by nebulization every 6 (six) hours as needed. For wheezing    . levothyroxine (SYNTHROID, LEVOTHROID) 88 MCG tablet Take 88 mcg by mouth daily before breakfast.    . Memantine HCl ER (NAMENDA XR) 28 MG CP24 Take 28 mg by mouth daily.    . metFORMIN (GLUCOPHAGE) 1000 MG tablet Take 1,000 mg by mouth 2 (two) times daily with a meal.    . Multiple Vitamin (MULTIVITAMIN WITH MINERALS) TABS tablet Take 1 tablet by mouth daily.    . pravastatin (PRAVACHOL) 20 MG tablet Take 20 mg by mouth every evening.    . tiotropium (SPIRIVA) 18 MCG inhalation capsule Place 18 mcg into inhaler and inhale daily.    . valsartan (DIOVAN) 160 MG tablet Take 160 mg by mouth daily.    . valsartan (DIOVAN) 160 MG tablet TAKE 1 TABLET BY MOUTH DAILY 30 tablet 5  . vitamin A 8000 UNIT capsule Take 8,000 Units by mouth daily.    . [DISCONTINUED] Calcium Carbonate-Vitamin D (CALCIUM 600/VITAMIN D) 600-400 MG-UNIT per tablet Take 1 tablet by mouth 2 (two) times daily.      . [DISCONTINUED] sodium chloride (OCEAN) 0.65 % nasal spray Place 2 sprays into the nose as needed. TO OPEN NASAL BREATHING     No current facility-administered medications on file prior to visit.

## 2014-10-23 NOTE — Telephone Encounter (Signed)
Per [facility]-ok for Augmentin 875mg  BID # 14.  Called and spoke to pt's EC, [name]. Informed Martie LeeSabrina of the script and sent to preferred pharmacy. Martie LeeSabrina stated she would relay the message to pt. Nothing further needed.

## 2014-10-31 ENCOUNTER — Telehealth: Payer: Self-pay | Admitting: Internal Medicine

## 2014-10-31 MED ORDER — AZITHROMYCIN 250 MG PO TABS: ORAL_TABLET | ORAL | Status: DC

## 2014-10-31 NOTE — Telephone Encounter (Signed)
Suggest ADDING  Zpak while finishing the augmentin. If this is purely a viral infection, then antibiotics won't be able to clear it and she will have to wait it out.

## 2014-10-31 NOTE — Telephone Encounter (Signed)
Pt aware of recs. Rx sent in. Nothing further needed 

## 2014-10-31 NOTE — Telephone Encounter (Signed)
Spoke with [name] (pt's e.c.), states that pt has been on augmentin for 7 days and still having a prod cough with yellow mucus, sore throat, states she believes she is running a fever but does not have a thermometer to check.  Is requesting further recs from [facility]. Last ov: 6/11 Next ov: 12/14  Dr. Maple HudsonYoung please advise.  Thank you.  Allergies  Allergen Reactions  . Aspirin Itching  . Codeine Itching  . Demerol Itching    Unknown-unconscious.  . Latex Itching  . Lipitor [Atorvastatin]     Urticaria   . Lisinopril Itching and Cough    REACTION: causes cough  . Zocor [Simvastatin]     Urticaria    Current Outpatient Prescriptions on File Prior to Visit  Medication Sig Dispense Refill  . albuterol (PROVENTIL HFA;VENTOLIN HFA) 108 (90 BASE) MCG/ACT inhaler Inhale 2 puffs into the lungs every 6 (six) hours as needed for wheezing or shortness of breath.    Marland Kitchen. amoxicillin-clavulanate (AUGMENTIN) 875-125 MG per tablet Take 1 tablet by mouth 2 (two) times daily. 14 tablet 0  . azelastine (ASTELIN) 0.1 % nasal spray Place 2 sprays into both nostrils 2 (two) times daily. Use in each nostril as directed    . budesonide-formoterol (SYMBICORT) 80-4.5 MCG/ACT inhaler Inhale 2 puffs into the lungs 2 (two) times daily. 1 Inhaler 1  . clindamycin (CLEOCIN) 150 MG capsule Take 600 mg by mouth daily. x7 days    . clonazePAM (KLONOPIN) 1 MG tablet TAKE 1 TO 2 TABLETS BY MOUTH EVERY DAY AT BEDTIME AS NEEDED FOR ANXIETY 60 tablet 3  . conjugated estrogens (PREMARIN) vaginal cream Place 1 Applicatorful vaginally 2 (two) times daily as needed (vaginal itching).     Marland Kitchen. donepezil (ARICEPT) 10 MG tablet Take 10 mg by mouth at bedtime.    Marland Kitchen. esomeprazole (NEXIUM) 40 MG capsule Take 40 mg by mouth daily at 12 noon.    . ezetimibe (ZETIA) 10 MG tablet Take 10 mg by mouth daily.    . fish oil-omega-3 fatty acids 1000 MG capsule Take 4,000 mg by mouth daily.     . furosemide (LASIX) 40 MG tablet Take 40 mg by mouth  daily.    Marland Kitchen. HYDROcodone-acetaminophen (NORCO/VICODIN) 5-325 MG per tablet Take 1 tablet by mouth every 6 (six) hours as needed for moderate pain. 120 tablet 0  . insulin glargine (LANTUS) 100 UNIT/ML injection Inject 40 Units into the skin daily.    Marland Kitchen. levalbuterol (XOPENEX) 0.63 MG/3ML nebulizer solution Take 1 ampule by nebulization every 6 (six) hours as needed. For wheezing    . levothyroxine (SYNTHROID, LEVOTHROID) 88 MCG tablet Take 88 mcg by mouth daily before breakfast.    . Memantine HCl ER (NAMENDA XR) 28 MG CP24 Take 28 mg by mouth daily.    . metFORMIN (GLUCOPHAGE) 1000 MG tablet Take 1,000 mg by mouth 2 (two) times daily with a meal.    . Multiple Vitamin (MULTIVITAMIN WITH MINERALS) TABS tablet Take 1 tablet by mouth daily.    . pravastatin (PRAVACHOL) 20 MG tablet Take 20 mg by mouth every evening.    . tiotropium (SPIRIVA) 18 MCG inhalation capsule Place 18 mcg into inhaler and inhale daily.    . valsartan (DIOVAN) 160 MG tablet Take 160 mg by mouth daily.    . valsartan (DIOVAN) 160 MG tablet TAKE 1 TABLET BY MOUTH DAILY 30 tablet 5  . vitamin A 8000 UNIT capsule Take 8,000 Units by mouth daily.    . [  DISCONTINUED] Calcium Carbonate-Vitamin D (CALCIUM 600/VITAMIN D) 600-400 MG-UNIT per tablet Take 1 tablet by mouth 2 (two) times daily.      . [DISCONTINUED] sodium chloride (OCEAN) 0.65 % nasal spray Place 2 sprays into the nose as needed. TO OPEN NASAL BREATHING     No current facility-administered medications on file prior to visit.

## 2014-11-09 ENCOUNTER — Ambulatory Visit: Payer: Medicare Other | Admitting: Internal Medicine

## 2014-11-12 ENCOUNTER — Ambulatory Visit: Payer: Medicare Other | Admitting: Internal Medicine

## 2014-11-12 ENCOUNTER — Other Ambulatory Visit (INDEPENDENT_AMBULATORY_CARE_PROVIDER_SITE_OTHER): Payer: Medicare Other | Admitting: *Deleted

## 2014-11-12 ENCOUNTER — Encounter: Payer: Self-pay | Admitting: Internal Medicine

## 2014-11-12 VITALS — BP 116/70 | HR 58 | Ht 63.0 in

## 2014-11-12 DIAGNOSIS — J4489 Other specified chronic obstructive pulmonary disease: Secondary | ICD-10-CM

## 2014-11-12 DIAGNOSIS — E785 Hyperlipidemia, unspecified: Secondary | ICD-10-CM

## 2014-11-12 DIAGNOSIS — Z79899 Other long term (current) drug therapy: Secondary | ICD-10-CM

## 2014-11-12 LAB — LIPID PANEL
CHOL/HDL RATIO: 3
Cholesterol: 141 mg/dL (ref 0–200)
HDL: 44.3 mg/dL (ref >39.00)
LDL CALC: 71 mg/dL (ref 0–99)
NonHDL: 96.7
TRIGLYCERIDES: 129 mg/dL (ref 0.0–149.0)
VLDL: 25.8 mg/dL (ref 0.0–40.0)

## 2014-11-12 LAB — HEPATIC FUNCTION PANEL
ALT: 9 U/L (ref 0–35)
AST: 20 U/L (ref 0–37)
Albumin: 3.3 g/dL — ABNORMAL LOW (ref 3.5–5.2)
Alkaline Phosphatase: 56 U/L (ref 39–117)
Bilirubin, Direct: 0 mg/dL (ref 0.0–0.3)
Total Bilirubin: 0.6 mg/dL (ref 0.2–1.2)
Total Protein: 6.2 g/dL (ref 6.0–8.3)

## 2014-11-12 NOTE — Patient Instructions (Signed)
I am glad you are feeling better.  Please call if we can help

## 2014-11-12 NOTE — Progress Notes (Signed)
Subjective:   Patient ID: [name], female DOB: 26-Dec-1945, 68 y.o. MRN: [id number]  HPI  05/19/11- 69 yoF former heavy smoker followed for asthma/ COPD, hx food intolerance, allergic rhinitis, complicated by obesity, DM  Last here November 20, 2010- note reviewed  Allergy vaccine 1:10 GH  Says she has been doing well. Spring pollen bothered nose and chest in March. Had some infection then, helped by doxycycline.  Reports no change in dry hacking cough, not relieved by cough syrup. Occasional, infrequent heart burn.  CXR 02/08/11- [place]- lungs clear, ASVD  We discussed updating PFT. She had smoked up to 3 PPD  07/13/11- 65 yoF former heavy smoker followed for asthma/ COPD, hx food intolerance, allergic rhinitis, complicated by obesity, DM  Did not schedule PFT after last visit as intended, but has done so now.  She feels well. Remains off cigarettes. Coughs only after using her inhaler- only a dry cough. No longer experiences shortness of breath.  Allergy vaccine- she feels they help her a lot. Went to ENT for ear pain- blamed throat congestion on reflux and Rx'd bid Nexium- "helps a lot".  09/18/11- 65 yoF former heavy smoker followed for asthma/ COPD, hx food intolerance, allergic rhinitis, complicated by obesity, DM  Has had flu vaccine. She had done fairly well since here in August. Last night she had onset of dry cough and head congestion without fever or sore throat. Intermittent pain in the right side of the neck at the angle of the jaw comes and goes. It is not associated with popping, vertigo, pain on chewing or altered hearing.  History of smoking 3 packs per day.  PFT 08/26/2011-FEV1 1.45/76%, FEV1/FVC 0.7 no, FEF 25-75% was 35% of predicted with slight response to bronchodilator. TLC 100% RV 131% DLCO 54%. Moderate obstruction with slight response to bronchodilator and air trapping. This is probably mostly some emphysema. CXR 01/29/2011-atherosclerosis with clear lung fields.   02/12/12- 65 yoF former heavy smoker followed for asthma/ COPD, hx food intolerance, allergic rhinitis, complicated by obesity, DM  Continues allergy vaccine without problems. She is sure it helps. She used her last doxycycline about 2 weeks ago for chest congestion. We discussed this.  With some vertigo and pressure in right ear. Often has pain in right side of neck. Occasional minor cough. She is on lisinopril and we discussed this carefully. Using rescue inhaler twice a week. Last PFT was 08/26/2011. Charted.   05/27/12- 65 yoF former heavy smoker followed for asthma/ COPD, hx food intolerance, allergic rhinitis, complicated by obesity, DM PCP Dr Debby Bud  Still on Allergy Vaccine 1:10 here; states rash on body from thighs to neck-just itchy all the time; Denies any wheezing,cough,SOB, or congestion.  Has had pruritic rash on trunk and arms for 3 weeks, cause unknown. Cortisone cream has not seemed to help. She had taken cephalexin for UTI last week the rash began before that. Breathing has been good with no recent exacerbation and insignificant cough and wheeze.   11/28/12- 65 yoF former heavy smoker followed for asthma/ COPD, hx food intolerance, allergic rhinitis, complicated by obesity, DM PCP Dr Debby Bud  FOLLOWS FOR: still on vaccine. having lots of congestion and wheezing.  For past 3 weeks acute illness. Sore throat moved to chest congestion with much wheeze, cough productive of clear mucus. Denies nasal congestion. She tried Augmentin which did not help. Did have flu vaccine. Family members living in the same house are very sick with similar illness.  She has continued  allergy vaccine 1:10 GH without problems  CXR 08/11/12  IMPRESSION:  No acute cardiopulmonary disease.  Original Report Authenticated By: Consuello BossierKYLE D. TALBOT, M.D.  05/29/13- [age] yoF former heavy smoker followed for asthma/ COPD, hx food intolerance, allergic rhinitis, complicated by obesity, DM PCP Dr Debby BudNorins  FOLLOWS FOR: still on  allergy vaccine 1:10 GH and doing well.  No longer feels much problem with her breathing even on walks.  Substernal pain just as she lay down last night, fell asleep despite it. Notes knot on anterior chest.  CXR 03/01/13  IMPRESSION:  No acute abnormality.  Original Report Authenticated By: Janeece Riggersavid Clark, M.D.  HPI 07/20/13 Dr Shelle Ironlance Patient comes in today for an acute sick visit.  The patient is usually followed by Dr. Maple HudsonYoung for asthma and COPD.  She gives a 3 to four-day history of low grade fever, severe throat discomfort, as well as general malaise.  She denies any shortness of breath, cough with purulence, or chest congestion.  She has not had any sinus congestion or purulence noted, and denies severe pain.  She has had some neck soreness that she thinks is from lymphadenopathy.  She denies any issues with voice change, no difficulty swallowing solids or liquids, and no drooling.  11/28/13- 67yoF former heavy smoker followed for asthma/ COPD, hx food intolerance, allergic rhinitis, complicated by obesity,  Dementia, DM   PCP Dr Debby BudNorins FOLLOWS FOR: still on Allergy vaccine 1:10 GH and doing well; will have slight runny nose with sinus headaches at times. Pt needs Rx printed for Astelin(Name brand ONLY). Continues Spiriva and Symbicort. She says allergy vaccine helps she can tell the difference if she has no transportation and has to miss her shot. She insists that brand-name Astelin nasal spray works much better than generic.  05/10/14- 68yoF former heavy smoker followed for asthma/ COPD, hx food intolerance, allergic rhinitis, complicated by obesity,anxiety, dementia, DM   PCP Dr Debby BudNorins FOLLOWS FOR: Pt reports increased cough with some pain in right shoulder blade x 1 week ago. [facility]04[facility].  Pt states that breathing has been doing okay since seen in ED.  blames allergy for watery rhinorrhea without sneezing. Continues allergy vaccine 1:10 GH and says it is"real good help". Using Astelin  nasal spray and Claritin. Denies cough or wheeze CXR 05/04/14 IMPRESSION:  No active cardiopulmonary disease.  Electronically Signed  By: Rise MuBenjamin McClintock M.D.  On: 05/04/2014 00:46  11/12/14- 68yoF former heavy smoker followed for asthma/ COPD, hx food intolerance, allergic rhinitis, complicated by obesity,anxiety, dementia, DM  FOLLOWS FOR: Has been doing well since using Cranberry juice-helps with cough,etc.   CXR 09/05/14 IMPRESSION: No acute disease. Electronically Signed  By: Drusilla Kannerhomas Dalessio M.D.  On: 09/05/2014 20:16  ROS-see HPI Constitutional:   No-   weight loss, night sweats, fevers, chills, fatigue, lassitude. HEENT:   No-  headaches, difficulty swallowing, tooth/dental problems, sore throat,       No-  sneezing, itching, ear ache, nasal congestion, +post nasal drip,  CV:  No-   chest pain, orthopnea, PND, swelling in lower extremities, anasarca, dizziness, palpitations Resp: No-   shortness of breath with exertion or at rest.              No-   productive cough,  No non-productive cough,  No- coughing up of blood.              No-   change in color of mucus.  No- wheezing.   Skin: No-  rash or lesions. GI:  No-   heartburn, indigestion, abdominal pain, nausea, vomiting, GU:  MS:  No-   joint pain or swelling.  Neuro-     nothing unusual Psych:  No- change in mood or affect. No depression or anxiety.  No memory loss.     Objective:  OBJ- Physical Exam General- Alert, Oriented, Affect-appropriate, Distress- none acute,  obese Skin- rash-none, lesions- none, excoriation- none Lymphadenopathy- none Head- atraumatic            Eyes- Gross vision intact, PERRLA, conjunctivae and secretions clear, + strabismus            Ears- Hearing, canals-normal            Nose- Clear, no-Septal dev, mucus, polyps, erosion, perforation             Throat- Mallampati II , mucosa clear , drainage- none, tonsils- atrophic Neck- flexible , trachea midline, no stridor , thyroid  nl, carotid no bruit Chest - symmetrical excursion , unlabored           Heart/CV- RRR , no murmur , no gallop  , no rub, nl s1 s2                           - JVD- none , edema- none, stasis changes- none, varices- none           Lung- clear to P&A, wheeze- none, cough- none , dullness-none, rub- none           Chest wall-  Abd-  Br/ Gen/ Rectal- Not done, not indicated Extrem- cyanosis- none, clubbing, none, atrophy- none, strength- nl Neuro- + tremor, +head Bob    Assessment & Plan:

## 2014-11-13 ENCOUNTER — Telehealth: Payer: Self-pay | Admitting: Pharmacist

## 2014-11-13 ENCOUNTER — Ambulatory Visit: Payer: Medicare Other | Admitting: Pharmacist

## 2014-11-13 NOTE — Telephone Encounter (Signed)
Called and spoke with pt regarding lipid panel results.  Lipids remain at goal. She is tolerating therapy well.  Will have her continue current therapy and follow up with Dr. Posey ReaPlotnikov as previously scheduled.  If she needs anything, she is aware to call.

## 2014-11-16 ENCOUNTER — Telehealth: Payer: Self-pay | Admitting: Internal Medicine

## 2014-11-16 ENCOUNTER — Ambulatory Visit: Payer: Medicare Other | Admitting: Internal Medicine

## 2014-11-16 ENCOUNTER — Other Ambulatory Visit: Payer: Self-pay | Admitting: Neurology

## 2014-11-16 MED ORDER — HYDROCODONE-ACETAMINOPHEN 5-325 MG PO TABS: 1.0000 | ORAL_TABLET | Freq: Four times a day (QID) | ORAL | Status: DC | PRN

## 2014-11-16 NOTE — Telephone Encounter (Signed)
Notified pt rx ready for pick-up.../lmb 

## 2014-11-16 NOTE — Telephone Encounter (Signed)
Pt requesting hydrocodone refill, pt has enough for two days -- appt in January.

## 2014-11-16 NOTE — Telephone Encounter (Signed)
OK to fill this prescription with additional refills x0 Thank you!  

## 2014-11-19 ENCOUNTER — Other Ambulatory Visit: Payer: Self-pay | Admitting: Geriatric Medicine

## 2014-11-19 ENCOUNTER — Ambulatory Visit: Payer: Medicare Other | Admitting: Adult Health

## 2014-11-19 ENCOUNTER — Telehealth: Payer: Self-pay | Admitting: Internal Medicine

## 2014-11-19 MED ORDER — METFORMIN HCL 1000 MG PO TABS: 1000.0000 mg | ORAL_TABLET | Freq: Two times a day (BID) | ORAL | Status: DC

## 2014-11-19 MED ORDER — AMOXICILLIN-POT CLAVULANATE 875-125 MG PO TABS: 1.0000 | ORAL_TABLET | Freq: Two times a day (BID) | ORAL | Status: DC

## 2014-11-19 NOTE — Telephone Encounter (Signed)
Offer augmentin 875, # 14, 1 twice daily 

## 2014-11-19 NOTE — Telephone Encounter (Signed)
lmtcb x1 

## 2014-11-19 NOTE — Telephone Encounter (Signed)
Spoke with pt daughter in law, aware that rx called into pharmacy. Nothing further needed.

## 2014-11-19 NOTE — Telephone Encounter (Signed)
Spoke with Martie LeeSabrina (daughter in law) States that pt is having cough with green mucus production, sinus congestion and pressure, HA and body aches x 2 days. Denies fever, SOB and wheezing.  Requests something be called into pharmacy.  Allergies  Allergen Reactions  . Aspirin Itching  . Codeine Itching  . Demerol Itching    Unknown-unconscious.  . Latex Itching  . Lipitor [Atorvastatin]     Urticaria   . Lisinopril Itching and Cough    REACTION: causes cough  . Zocor [Simvastatin]     Urticaria    CY please advise. Thanks.     Medication List       This list is accurate as of: 11/19/14 10:36 AM.  Always use your most recent med list.               albuterol 108 (90 BASE) MCG/ACT inhaler  Commonly known as:  PROVENTIL HFA;VENTOLIN HFA  Inhale 2 puffs into the lungs every 6 (six) hours as needed for wheezing or shortness of breath.     azelastine 0.1 % nasal spray  Commonly known as:  ASTELIN  Place 2 sprays into both nostrils 2 (two) times daily. Use in each nostril as directed     budesonide-formoterol 80-4.5 MCG/ACT inhaler  Commonly known as:  SYMBICORT  Inhale 2 puffs into the lungs 2 (two) times daily.     clindamycin 150 MG capsule  Commonly known as:  CLEOCIN  Take 600 mg by mouth daily. x7 days     clonazePAM 1 MG tablet  Commonly known as:  KLONOPIN  TAKE 1 TO 2 TABLETS BY MOUTH EVERY DAY AT BEDTIME AS NEEDED FOR ANXIETY     conjugated estrogens vaginal cream  Commonly known as:  PREMARIN  Place 1 Applicatorful vaginally 2 (two) times daily as needed (vaginal itching).     donepezil 10 MG tablet  Commonly known as:  ARICEPT  Take 10 mg by mouth at bedtime.     esomeprazole 40 MG capsule  Commonly known as:  NEXIUM  Take 40 mg by mouth daily at 12 noon.     ezetimibe 10 MG tablet  Commonly known as:  ZETIA  Take 10 mg by mouth daily.     fish oil-omega-3 fatty acids 1000 MG capsule  Take 4,000 mg by mouth daily.     furosemide 40 MG tablet   Commonly known as:  LASIX  Take 40 mg by mouth daily.     HYDROcodone-acetaminophen 5-325 MG per tablet  Commonly known as:  NORCO/VICODIN  Take 1 tablet by mouth every 6 (six) hours as needed for moderate pain.     insulin glargine 100 UNIT/ML injection  Commonly known as:  LANTUS  Inject 40 Units into the skin daily.     levalbuterol 0.63 MG/3ML nebulizer solution  Commonly known as:  XOPENEX  Take 1 ampule by nebulization every 6 (six) hours as needed. For wheezing     levothyroxine 88 MCG tablet  Commonly known as:  SYNTHROID, LEVOTHROID  Take 88 mcg by mouth daily before breakfast.     metFORMIN 1000 MG tablet  Commonly known as:  GLUCOPHAGE  Take 1,000 mg by mouth 2 (two) times daily with a meal.     multivitamin with minerals Tabs tablet  Take 1 tablet by mouth daily.     NAMENDA XR 28 MG Cp24  Generic drug:  Memantine HCl ER  Take 28 mg by mouth daily.     NAMENDA XR  28 MG Cp24  Generic drug:  Memantine HCl ER  TAKE 1 CAPSULE BY MOUTH DAILY     pravastatin 20 MG tablet  Commonly known as:  PRAVACHOL  Take 20 mg by mouth every evening.     tiotropium 18 MCG inhalation capsule  Commonly known as:  SPIRIVA  Place 18 mcg into inhaler and inhale daily.     valsartan 160 MG tablet  Commonly known as:  DIOVAN  TAKE 1 TABLET BY MOUTH DAILY     vitamin A 8000 UNIT capsule  Take 8,000 Units by mouth daily.

## 2014-11-21 ENCOUNTER — Ambulatory Visit: Payer: Medicare Other | Admitting: Adult Health

## 2014-11-27 ENCOUNTER — Other Ambulatory Visit: Payer: Self-pay

## 2014-11-27 ENCOUNTER — Telehealth: Payer: Self-pay | Admitting: Internal Medicine

## 2014-11-27 MED ORDER — PRAVASTATIN SODIUM 20 MG PO TABS: 20.0000 mg | ORAL_TABLET | Freq: Every evening | ORAL | Status: DC

## 2014-11-27 NOTE — Telephone Encounter (Signed)
Pt daughter in law called in and said that pt feet are swelling and wanted to see what she could do?  Requested to speck to a nurse   Best number [phone]

## 2014-11-27 NOTE — Telephone Encounter (Signed)
Cont Furosemide 40 mg q am OV w/any provider avail Thx

## 2014-11-27 NOTE — Telephone Encounter (Signed)
Pt took furosemide 40 mg this morning. She c/o bilateral foot/ankle edema. She denies SOB, discoloration and pain. Please advise.

## 2014-11-28 NOTE — Telephone Encounter (Signed)
Pt informed. She states daughter needs to schedule OV. Left detailed mess informing daughter, Martie LeeSabrina.

## 2014-11-29 ENCOUNTER — Encounter: Payer: Self-pay | Admitting: Family

## 2014-11-29 ENCOUNTER — Ambulatory Visit (INDEPENDENT_AMBULATORY_CARE_PROVIDER_SITE_OTHER): Payer: Medicare Other | Admitting: Family

## 2014-11-29 ENCOUNTER — Other Ambulatory Visit (INDEPENDENT_AMBULATORY_CARE_PROVIDER_SITE_OTHER): Payer: Medicare Other

## 2014-11-29 VITALS — BP 132/68 | HR 61 | Temp 97.7°F | Resp 18 | Ht 63.0 in | Wt 194.4 lb

## 2014-11-29 DIAGNOSIS — M7989 Other specified soft tissue disorders: Secondary | ICD-10-CM

## 2014-11-29 LAB — BASIC METABOLIC PANEL
BUN: 15 mg/dL (ref 6–23)
CO2: 30 mEq/L (ref 19–32)
CREATININE: 0.9 mg/dL (ref 0.4–1.2)
Calcium: 9.9 mg/dL (ref 8.4–10.5)
Chloride: 103 mEq/L (ref 96–112)
GFR: 64.8 mL/min (ref >60.00)
Glucose, Bld: 151 mg/dL — ABNORMAL HIGH (ref 70–99)
POTASSIUM: 3.9 meq/L (ref 3.5–5.1)
Sodium: 140 mEq/L (ref 135–145)

## 2014-11-29 NOTE — Progress Notes (Signed)
Pre visit review using our clinic review tool, if applicable. No additional management support is needed unless otherwise documented below in the visit note. 

## 2014-11-29 NOTE — Assessment & Plan Note (Signed)
Increased edema noted bilaterally. Increase Lasix to 60 mg for the next 4 days. Instructed to elevate her legs and wear compression socks as needed. Obtain basic metabolic panel and d-dimer. If d-dimer comes back positive, refer for venous Dopplers. Patient instructed on warning signs of when to seek emergency medical care. Consider 2-D echo if symptoms worsen. Follow up if symptoms worsen or fail to improve.

## 2014-11-29 NOTE — Patient Instructions (Addendum)
Please increase your lasix to 60 mg for the next 4 days for your leg swelling. Keep legs elevated as much as possible. Use compression socks as needed.   Thank you for choosing [facility].  Summary/Instructions:  Please stop by the lab on the basement level of the building for your blood work. Your results will be released to MyChart (or called to you) after review, usually within 72hours after test completion. If any changes need to be made, you will be notified at that same time.  If your symptoms worsen or fail to improve, please contact our office for further instruction, or in case of emergency go directly to the emergency room at the closest medical facility.   Please call [name] with test results.

## 2014-11-29 NOTE — Progress Notes (Signed)
Subjective:    Patient ID: [name], female    DOB: 04/09/1946, 68 y.o.   MRN: [id number]  Chief Complaint  Patient presents with  . Foot Swelling    Feet, leg, and ankle swelling and redness, x2 weeks    HPI:  [name] is a 68 y.o. female who presents today for an acute visit.   Acute symptoms of bilateral feet and ankle swelling with some redness has been going on for about 2 weeks. Has been maintained on 40 mg of furosemide daily. Denies fevers. Indicates that she has had increased shortness of breath, but states she has had a lot of mucus.    Allergies  Allergen Reactions  . Aspirin Itching  . Codeine Itching  . Demerol Itching    Unknown-unconscious.  . Latex Itching  . Lipitor [Atorvastatin]     Urticaria   . Lisinopril Itching and Cough    REACTION: causes cough  . Zocor [Simvastatin]     Urticaria     Current Outpatient Prescriptions  Medication Sig Dispense Refill  . albuterol (PROVENTIL HFA;VENTOLIN HFA) 108 (90 BASE) MCG/ACT inhaler Inhale 2 puffs into the lungs every 6 (six) hours as needed for wheezing or shortness of breath.    Marland Kitchen. amoxicillin-clavulanate (AUGMENTIN) 875-125 MG per tablet Take 1 tablet by mouth 2 (two) times daily. 14 tablet 0  . azelastine (ASTELIN) 0.1 % nasal spray Place 2 sprays into both nostrils 2 (two) times daily. Use in each nostril as directed    . budesonide-formoterol (SYMBICORT) 80-4.5 MCG/ACT inhaler Inhale 2 puffs into the lungs 2 (two) times daily. 1 Inhaler 1  . clindamycin (CLEOCIN) 150 MG capsule Take 600 mg by mouth daily. x7 days    . clonazePAM (KLONOPIN) 1 MG tablet TAKE 1 TO 2 TABLETS BY MOUTH EVERY DAY AT BEDTIME AS NEEDED FOR ANXIETY 60 tablet 3  . conjugated estrogens (PREMARIN) vaginal cream Place 1 Applicatorful vaginally 2 (two) times daily as needed (vaginal itching).     Marland Kitchen. donepezil (ARICEPT) 10 MG tablet Take 10 mg by mouth at bedtime.    Marland Kitchen. esomeprazole (NEXIUM) 40 MG capsule Take 40 mg by mouth  daily at 12 noon.    . ezetimibe (ZETIA) 10 MG tablet Take 10 mg by mouth daily.    . fish oil-omega-3 fatty acids 1000 MG capsule Take 4,000 mg by mouth daily.     . furosemide (LASIX) 40 MG tablet Take 40 mg by mouth daily.    Marland Kitchen. HYDROcodone-acetaminophen (NORCO/VICODIN) 5-325 MG per tablet Take 1 tablet by mouth every 6 (six) hours as needed for moderate pain. 120 tablet 0  . insulin glargine (LANTUS) 100 UNIT/ML injection Inject 40 Units into the skin daily.    Marland Kitchen. levalbuterol (XOPENEX) 0.63 MG/3ML nebulizer solution Take 1 ampule by nebulization every 6 (six) hours as needed. For wheezing    . levothyroxine (SYNTHROID, LEVOTHROID) 88 MCG tablet Take 88 mcg by mouth daily before breakfast.    . Memantine HCl ER (NAMENDA XR) 28 MG CP24 Take 28 mg by mouth daily.    . metFORMIN (GLUCOPHAGE) 1000 MG tablet Take 1 tablet (1,000 mg total) by mouth 2 (two) times daily with a meal. 60 tablet 3  . Multiple Vitamin (MULTIVITAMIN WITH MINERALS) TABS tablet Take 1 tablet by mouth daily.    Marland Kitchen. NAMENDA XR 28 MG CP24 TAKE 1 CAPSULE BY MOUTH DAILY 90 capsule 0  . pravastatin (PRAVACHOL) 20 MG tablet Take 1 tablet (20 mg  total) by mouth every evening. 30 tablet 5  . tiotropium (SPIRIVA) 18 MCG inhalation capsule Place 18 mcg into inhaler and inhale daily.    . valsartan (DIOVAN) 160 MG tablet TAKE 1 TABLET BY MOUTH DAILY 30 tablet 5  . vitamin A 8000 UNIT capsule Take 8,000 Units by mouth daily.    . [DISCONTINUED] Calcium Carbonate-Vitamin D (CALCIUM 600/VITAMIN D) 600-400 MG-UNIT per tablet Take 1 tablet by mouth 2 (two) times daily.      . [DISCONTINUED] sodium chloride (OCEAN) 0.65 % nasal spray Place 2 sprays into the nose as needed. TO OPEN NASAL BREATHING     No current facility-administered medications for this visit.    Review of Systems  Constitutional: Positive for chills. Negative for fever and unexpected weight change.  Respiratory: Positive for shortness of breath (Occasional).     Cardiovascular: Positive for leg swelling. Negative for chest pain.       Objective:    BP 132/68 mmHg  Pulse 61  Temp(Src) 97.7 F (36.5 C) (Oral)  Resp 18  Ht 5\' 3"  (1.6 m)  Wt 194 lb 6.4 oz (88.179 kg)  BMI 34.44 kg/m2  SpO2 97% Nursing note and vital signs reviewed.  Physical Exam  Constitutional: She is oriented to person, place, and time. She appears well-developed and well-nourished. No distress.  Cardiovascular: Normal rate, regular rhythm, normal heart sounds and intact distal pulses.   No obvious deformity or discoloration of bilateral legs. 1-2+ pitting edema noted bilaterally with the right greater than the left. Pulses are intact and appropriate. Denies calf pain. Slight increase in temperature noted.  Pulmonary/Chest: Effort normal and breath sounds normal.  Neurological: She is alert and oriented to person, place, and time.  Skin: Skin is warm and dry.  Psychiatric: She has a normal mood and affect. Her behavior is normal. Judgment and thought content normal.       Assessment & Plan:

## 2014-11-30 ENCOUNTER — Telehealth: Payer: Self-pay | Admitting: Family

## 2014-11-30 DIAGNOSIS — R600 Localized edema: Secondary | ICD-10-CM

## 2014-11-30 LAB — D-DIMER, QUANTITATIVE: D-Dimer, Quant: 1.36 ug/mL-FEU — ABNORMAL HIGH (ref 0.00–0.48)

## 2014-11-30 NOTE — Telephone Encounter (Signed)
Left brief message for patient's daughter per request with test results being elevated and the need for the [place]. [place] ordered. Pt instructed to seek emergency medical care if sudden chest pain, shortness of breath or difficulty breathing develop.

## 2014-12-06 ENCOUNTER — Telehealth: Payer: Self-pay | Admitting: Internal Medicine

## 2014-12-06 ENCOUNTER — Other Ambulatory Visit (HOSPITAL_COMMUNITY): Payer: Self-pay | Admitting: Cardiology

## 2014-12-06 DIAGNOSIS — M7989 Other specified soft tissue disorders: Secondary | ICD-10-CM

## 2014-12-06 MED ORDER — INSULIN GLARGINE 100 UNIT/ML ~~LOC~~ SOLN: 40.0000 [IU] | Freq: Every day | SUBCUTANEOUS | Status: DC

## 2014-12-06 NOTE — Telephone Encounter (Signed)
Pt needs Lantus called in today, pt will be out of meds tonight.

## 2014-12-06 NOTE — Telephone Encounter (Signed)
Pt also is suppose to be scheduled for US and has not heard back yet.

## 2014-12-06 NOTE — Telephone Encounter (Signed)
Called pt spoke with daughter-in-law inform lantus has been sent to pharmacy. [facility]  [facility]/S.../lmb

## 2014-12-09 ENCOUNTER — Emergency Department (HOSPITAL_COMMUNITY): Payer: Medicare Other

## 2014-12-09 ENCOUNTER — Emergency Department (HOSPITAL_COMMUNITY)
Admission: EM | Admit: 2014-12-09 | Discharge: 2014-12-09 | Disposition: A | Discharge status: Home / Self Care | Payer: Medicare Other | Attending: Emergency Medicine | Admitting: Emergency Medicine

## 2014-12-09 ENCOUNTER — Encounter (HOSPITAL_COMMUNITY): Payer: Self-pay | Admitting: Adult Health

## 2014-12-09 DIAGNOSIS — Z7951 Long term (current) use of inhaled steroids: Secondary | ICD-10-CM | POA: Diagnosis not present

## 2014-12-09 DIAGNOSIS — Z86010 Personal history of colonic polyps: Secondary | ICD-10-CM | POA: Insufficient documentation

## 2014-12-09 DIAGNOSIS — Z91040 Latex allergy status: Secondary | ICD-10-CM | POA: Diagnosis not present

## 2014-12-09 DIAGNOSIS — M7989 Other specified soft tissue disorders: Secondary | ICD-10-CM | POA: Diagnosis not present

## 2014-12-09 DIAGNOSIS — Z792 Long term (current) use of antibiotics: Secondary | ICD-10-CM | POA: Diagnosis not present

## 2014-12-09 DIAGNOSIS — Z794 Long term (current) use of insulin: Secondary | ICD-10-CM | POA: Insufficient documentation

## 2014-12-09 DIAGNOSIS — K219 Gastro-esophageal reflux disease without esophagitis: Secondary | ICD-10-CM | POA: Diagnosis not present

## 2014-12-09 DIAGNOSIS — J449 Chronic obstructive pulmonary disease, unspecified: Secondary | ICD-10-CM | POA: Insufficient documentation

## 2014-12-09 DIAGNOSIS — E119 Type 2 diabetes mellitus without complications: Secondary | ICD-10-CM | POA: Diagnosis not present

## 2014-12-09 DIAGNOSIS — G629 Polyneuropathy, unspecified: Secondary | ICD-10-CM | POA: Diagnosis not present

## 2014-12-09 DIAGNOSIS — D509 Iron deficiency anemia, unspecified: Secondary | ICD-10-CM | POA: Insufficient documentation

## 2014-12-09 DIAGNOSIS — M1990 Unspecified osteoarthritis, unspecified site: Secondary | ICD-10-CM | POA: Insufficient documentation

## 2014-12-09 DIAGNOSIS — M79609 Pain in unspecified limb: Secondary | ICD-10-CM | POA: Diagnosis not present

## 2014-12-09 DIAGNOSIS — E538 Deficiency of other specified B group vitamins: Secondary | ICD-10-CM | POA: Diagnosis not present

## 2014-12-09 DIAGNOSIS — R0602 Shortness of breath: Secondary | ICD-10-CM | POA: Diagnosis not present

## 2014-12-09 DIAGNOSIS — I10 Essential (primary) hypertension: Secondary | ICD-10-CM | POA: Insufficient documentation

## 2014-12-09 DIAGNOSIS — E785 Hyperlipidemia, unspecified: Secondary | ICD-10-CM | POA: Insufficient documentation

## 2014-12-09 DIAGNOSIS — Z87891 Personal history of nicotine dependence: Secondary | ICD-10-CM | POA: Insufficient documentation

## 2014-12-09 DIAGNOSIS — M79604 Pain in right leg: Secondary | ICD-10-CM | POA: Diagnosis present

## 2014-12-09 DIAGNOSIS — E039 Hypothyroidism, unspecified: Secondary | ICD-10-CM | POA: Diagnosis not present

## 2014-12-09 LAB — BASIC METABOLIC PANEL
ANION GAP: 16 — AB (ref 5–15)
BUN: 21 mg/dL (ref 6–23)
CO2: 20 mmol/L (ref 19–32)
Calcium: 9.9 mg/dL (ref 8.4–10.5)
Chloride: 105 mEq/L (ref 96–112)
Creatinine, Ser: 1.12 mg/dL — ABNORMAL HIGH (ref 0.50–1.10)
GFR calc Af Amer: 56 mL/min — ABNORMAL LOW (ref >90)
GFR calc non Af Amer: 49 mL/min — ABNORMAL LOW (ref >90)
Glucose, Bld: 139 mg/dL — ABNORMAL HIGH (ref 70–99)
Potassium: 3.7 mmol/L (ref 3.5–5.1)
SODIUM: 141 mmol/L (ref 135–145)

## 2014-12-09 LAB — CBC
HEMATOCRIT: 33 % — AB (ref 36.0–46.0)
HEMOGLOBIN: 11 g/dL — AB (ref 12.0–15.0)
MCH: 30.1 pg (ref 26.0–34.0)
MCHC: 33.3 g/dL (ref 30.0–36.0)
MCV: 90.4 fL (ref 78.0–100.0)
Platelets: 301 10*3/uL (ref 150–400)
RBC: 3.65 MIL/uL — AB (ref 3.87–5.11)
RDW: 13.1 % (ref 11.5–15.5)
WBC: 5.8 10*3/uL (ref 4.0–10.5)

## 2014-12-09 LAB — TROPONIN I: Troponin I: <0.03 ng/mL (ref <0.031)

## 2014-12-09 MED ORDER — GABAPENTIN 300 MG PO CAPS: 300.0000 mg | ORAL_CAPSULE | Freq: Every day | ORAL | Status: DC

## 2014-12-09 NOTE — ED Provider Notes (Signed)
CSN: [id number]     Arrival date & time 12/09/14  1356 History   First MD Initiated Contact with Patient 12/09/14 1758     Chief Complaint  Patient presents with  . Leg Pain     (Consider location/radiation/quality/duration/timing/severity/associated sxs/prior Treatment) HPI Comments: Patient presents to the ER for evaluation of bilateral leg pain. Symptoms have been ongoing for several weeks. Her doctor has her on a diuretic for peripheral edema and did have her temporarily increase it recently. This did help her symptoms somewhat. Because of the swelling, she has been scheduled for a venous Doppler but it has not been performed yet. She reports that she has cramping in her legs at night and is having trouble sleeping.  Patient is a 69 y.o. female presenting with leg pain.  Leg Pain   Past Medical History  Diagnosis Date  . Personal history of colonic polyps   . Iron deficiency anemia, unspecified   . Other malaise and fatigue   . Chest pain, unspecified   . Other B-complex deficiencies   . Arthritis     Right Sacroiliac joint  . Other and unspecified hyperlipidemia   . Anxiety and depression   . HTN (hypertension)   . Unspecified hypothyroidism   . Type II or unspecified type diabetes mellitus without mention of complication, not stated as uncontrolled   . Allergic rhinitis, cause unspecified   . Unspecified asthma(493.90)   . Chronic airway obstruction, not elsewhere classified   . Abnormal stress test 08/21/2008    Neg  . GERD (gastroesophageal reflux disease)    Past Surgical History  Procedure Laterality Date  . Foot surgery      hammer toe 2004, 2005  . Abdominal hysterectomy  1982    menometorrhagia  . Carpal tunnel release  03/05/2009    bilateral  . Colonoscopy N/A 09/06/2013    Procedure: COLONOSCOPY;  Surgeon: Louis Meckel, MD;  Location: [facility];  Service: Endoscopy;  Laterality: N/A;  . Hot hemostasis N/A 09/06/2013    Procedure: HOT HEMOSTASIS  (ARGON PLASMA COAGULATION/BICAP);  Surgeon: Louis Meckel, MD;  Location: [facility];  Service: Endoscopy;  Laterality: N/A;   Family History  Problem Relation Age of Onset  . Hypertension Mother   . Diabetes Mother   . Heart disease Mother     CHF  . Coronary artery disease Mother   . Lung cancer Father   . Hypertension Father   . Diabetes Father   . Liver cancer Father     w/mets  . Prostate cancer Father   . Breast cancer Sister   . Lung disease Brother     Agent Orange Related  . Asthma Brother   . Allergies Brother   . Colon cancer Neg Hx    History  Substance Use Topics  . Smoking status: Former Smoker    Quit date: 06/01/1997  . Smokeless tobacco: Never Used     Comment: Quit 9 years ago.  . Alcohol Use: No   OB History    No data available     Review of Systems  Musculoskeletal: Positive for myalgias.  All other systems reviewed and are negative.     Allergies  Aspirin; Codeine; Demerol; Latex; Lipitor; Lisinopril; and Zocor  Home Medications   Prior to Admission medications   Medication Sig Start Date End Date Taking? Authorizing Provider  albuterol (PROVENTIL HFA;VENTOLIN HFA) 108 (90 BASE) MCG/ACT inhaler Inhale 2 puffs into the lungs every 6 (six) hours as needed  for wheezing or shortness of breath.    Historical Provider, MD  amoxicillin-clavulanate (AUGMENTIN) 875-125 MG per tablet Take 1 tablet by mouth 2 (two) times daily. 11/19/14   Waymon Budgelinton D Young, MD  azelastine (ASTELIN) 0.1 % nasal spray Place 2 sprays into both nostrils 2 (two) times daily. Use in each nostril as directed    Historical Provider, MD  budesonide-formoterol (SYMBICORT) 80-4.5 MCG/ACT inhaler Inhale 2 puffs into the lungs 2 (two) times daily. 10/12/14   Aleksei Plotnikov V, MD  clindamycin (CLEOCIN) 150 MG capsule Take 600 mg by mouth daily. x7 days    Historical Provider, MD  clonazePAM (KLONOPIN) 1 MG tablet TAKE 1 TO 2 TABLETS BY MOUTH EVERY DAY AT BEDTIME AS NEEDED FOR  ANXIETY 10/24/14   Aleksei Plotnikov V, MD  conjugated estrogens (PREMARIN) vaginal cream Place 1 Applicatorful vaginally 2 (two) times daily as needed (vaginal itching).     Historical Provider, MD  donepezil (ARICEPT) 10 MG tablet Take 10 mg by mouth at bedtime.    Historical Provider, MD  esomeprazole (NEXIUM) 40 MG capsule Take 40 mg by mouth daily at 12 noon.    Historical Provider, MD  ezetimibe (ZETIA) 10 MG tablet Take 10 mg by mouth daily.    Historical Provider, MD  fish oil-omega-3 fatty acids 1000 MG capsule Take 4,000 mg by mouth daily.     Historical Provider, MD  furosemide (LASIX) 40 MG tablet Take 40 mg by mouth daily.    Historical Provider, MD  HYDROcodone-acetaminophen (NORCO/VICODIN) 5-325 MG per tablet Take 1 tablet by mouth every 6 (six) hours as needed for moderate pain. 11/16/14   Aleksei Plotnikov V, MD  insulin glargine (LANTUS) 100 UNIT/ML injection Inject 0.4 mLs (40 Units total) into the skin daily. 12/06/14   Aleksei Plotnikov V, MD  levalbuterol (XOPENEX) 0.63 MG/3ML nebulizer solution Take 1 ampule by nebulization every 6 (six) hours as needed. For wheezing 11/28/12   Waymon Budgelinton D Young, MD  levothyroxine (SYNTHROID, LEVOTHROID) 88 MCG tablet Take 88 mcg by mouth daily before breakfast.    Historical Provider, MD  Memantine HCl ER (NAMENDA XR) 28 MG CP24 Take 28 mg by mouth daily.    Historical Provider, MD  metFORMIN (GLUCOPHAGE) 1000 MG tablet Take 1 tablet (1,000 mg total) by mouth 2 (two) times daily with a meal. 11/19/14   Jacinta ShoeAleksei Plotnikov V, MD  Multiple Vitamin (MULTIVITAMIN WITH MINERALS) TABS tablet Take 1 tablet by mouth daily.    Historical Provider, MD  NAMENDA XR 28 MG CP24 TAKE 1 CAPSULE BY MOUTH DAILY 11/16/14   Levert FeinsteinYijun Yan, MD  pravastatin (PRAVACHOL) 20 MG tablet Take 1 tablet (20 mg total) by mouth every evening. 11/27/14   Aleksei Plotnikov V, MD  tiotropium (SPIRIVA) 18 MCG inhalation capsule Place 18 mcg into inhaler and inhale daily.    Historical  Provider, MD  valsartan (DIOVAN) 160 MG tablet TAKE 1 TABLET BY MOUTH DAILY 09/13/14   Aleksei Plotnikov V, MD  vitamin A 8000 UNIT capsule Take 8,000 Units by mouth daily.    Historical Provider, MD   BP 135/49 mmHg  Pulse 59  Temp(Src) 98.2 F (36.8 C) (Oral)  Resp 20  SpO2 100% Physical Exam  Constitutional: She is oriented to person, place, and time. She appears well-developed and well-nourished. No distress.  HENT:  Head: Normocephalic and atraumatic.  Right Ear: Hearing normal.  Left Ear: Hearing normal.  Nose: Nose normal.  Mouth/Throat: Oropharynx is clear and moist and mucous membranes are normal.  Eyes: Conjunctivae and EOM are normal. Pupils are equal, round, and reactive to light.  Neck: Normal range of motion. Neck supple.  Cardiovascular: Regular rhythm, S1 normal and S2 normal.  Exam reveals no gallop and no friction rub.   No murmur heard. Pulses:      Dorsalis pedis pulses are 2+ on the right side, and 2+ on the left side.  Pulmonary/Chest: Effort normal and breath sounds normal. No respiratory distress. She exhibits no tenderness.  Abdominal: Soft. Normal appearance and bowel sounds are normal. There is no hepatosplenomegaly. There is no tenderness. There is no rebound, no guarding, no tenderness at McBurney's point and negative Murphy's sign. No hernia.  Musculoskeletal: Normal range of motion.  Neurological: She is alert and oriented to person, place, and time. She has normal strength. No cranial nerve deficit or sensory deficit. Coordination normal. GCS eye subscore is 4. GCS verbal subscore is 5. GCS motor subscore is 6.  Skin: Skin is warm, dry and intact. No rash noted. No cyanosis.  Psychiatric: She has a normal mood and affect. Her speech is normal and behavior is normal. Thought content normal.  Nursing note and vitals reviewed.   ED Course  Procedures (including critical care time) Labs Review Labs Reviewed  CBC - Abnormal; Notable for the following:     RBC 3.65 (*)    Hemoglobin 11.0 (*)    HCT 33.0 (*)    All other components within normal limits  BASIC METABOLIC PANEL - Abnormal; Notable for the following:    Glucose, Bld 139 (*)    Creatinine, Ser 1.12 (*)    GFR calc non Af Amer 49 (*)    GFR calc Af Amer 56 (*)    Anion gap 16 (*)    All other components within normal limits  TROPONIN I    Imaging Review Dg Chest 2 View  12/09/2014   CLINICAL DATA:  Initial evaluation for mid chest pain and shortness of breath that began this morning with personal history of hypertension  EXAM: CHEST  2 VIEW  COMPARISON:  09/05/2014  FINDINGS: Heart size and vascular pattern are normal. Calcification of the aortic arch. No consolidation or effusion.  IMPRESSION: No active cardiopulmonary disease.   Electronically Signed   By: Esperanza Heir M.D.   On: 12/09/2014 17:05     EKG Interpretation   Date/Time:  Sunday December 09 2014 15:32:23 EST Ventricular Rate:  67 PR Interval:  146 QRS Duration: 118 QT Interval:  430 QTC Calculation: 454 R Axis:   -48 Text Interpretation:  Sinus rhythm with marked sinus arrhythmia Left  anterior fascicular block Left ventricular hypertrophy with QRS widening  Lateral infarct , age undetermined Abnormal ECG No significant change  since last tracing Confirmed by POLLINA  MD, CHRISTOPHER [phone]) on  12/09/2014 6:42:03 PM      MDM   Final diagnoses:  SOB (shortness of breath)  Leg swelling   peripheral neuropathy  Patient presents to the ER for evaluation of bilateral leg pain. She has been experiencing swelling as well. Examination reveals normal skin, no concern for cellulitis or infection. She does have peripheral pulses are palpable, no evidence of ischemia. Legs are soft, no compartment swelling or tenderness. Venous duplex negative. Lab work unremarkable. Patient reassured, this might be related to her diabetes and peripheral neuropathy. Will initiate Neurontin, follow-up with primary  doctor.    Gilda Crease, MD 12/09/14 [phone]

## 2014-12-09 NOTE — ED Notes (Addendum)
Presents with bilateral knee, ankle and foot pain. She has a venous Doppler set up for Tuesday from her Primary Care, Reports pain in both legs for 5 days, no redness, swelling noted at this time. She also c/o lower back pain for 2-3 weeks.  She had a D-Dimer done 10 days ago and it was high. Her pain became worse and she was unable to wait until Tuesday.  Endorses some mild chest pain this AM and SOB

## 2014-12-09 NOTE — Progress Notes (Signed)
VASCULAR LAB PRELIMINARY  PRELIMINARY  PRELIMINARY  PRELIMINARY  Bilateral lower extremity venous Dopplers completed.    Preliminary report:  There is no DVT or SVT noted in the bilateral lower extremities.   Kasie Leccese, RVT 12/09/2014, 6:04 PM

## 2014-12-09 NOTE — ED Notes (Signed)
Arrived to E37 from Vascular

## 2014-12-09 NOTE — Discharge Instructions (Signed)

## 2014-12-11 ENCOUNTER — Encounter (HOSPITAL_COMMUNITY): Payer: Medicaid Other

## 2014-12-12 ENCOUNTER — Encounter: Payer: Self-pay | Admitting: Internal Medicine

## 2014-12-12 ENCOUNTER — Ambulatory Visit (INDEPENDENT_AMBULATORY_CARE_PROVIDER_SITE_OTHER): Payer: Medicare Other | Admitting: Internal Medicine

## 2014-12-12 ENCOUNTER — Other Ambulatory Visit: Payer: Self-pay | Admitting: Internal Medicine

## 2014-12-12 VITALS — BP 110/60 | HR 73 | Temp 97.6°F | Wt 191.0 lb

## 2014-12-12 DIAGNOSIS — E034 Atrophy of thyroid (acquired): Secondary | ICD-10-CM

## 2014-12-12 DIAGNOSIS — E038 Other specified hypothyroidism: Secondary | ICD-10-CM

## 2014-12-12 DIAGNOSIS — B372 Candidiasis of skin and nail: Secondary | ICD-10-CM | POA: Insufficient documentation

## 2014-12-12 DIAGNOSIS — I10 Essential (primary) hypertension: Secondary | ICD-10-CM

## 2014-12-12 DIAGNOSIS — E1129 Type 2 diabetes mellitus with other diabetic kidney complication: Secondary | ICD-10-CM | POA: Diagnosis not present

## 2014-12-12 DIAGNOSIS — E538 Deficiency of other specified B group vitamins: Secondary | ICD-10-CM

## 2014-12-12 DIAGNOSIS — R413 Other amnesia: Secondary | ICD-10-CM

## 2014-12-12 DIAGNOSIS — E1165 Type 2 diabetes mellitus with hyperglycemia: Secondary | ICD-10-CM

## 2014-12-12 DIAGNOSIS — IMO0002 Reserved for concepts with insufficient information to code with codable children: Secondary | ICD-10-CM

## 2014-12-12 MED ORDER — FUROSEMIDE 40 MG PO TABS: 60.0000 mg | ORAL_TABLET | Freq: Every day | ORAL | Status: DC

## 2014-12-12 MED ORDER — KETOCONAZOLE 2 % EX CREA: 1.0000 "application " | TOPICAL_CREAM | Freq: Every day | CUTANEOUS | Status: DC

## 2014-12-12 NOTE — Assessment & Plan Note (Signed)
Chronic  Continue with current prescription therapy as reflected on the Med list.  

## 2014-12-12 NOTE — Assessment & Plan Note (Signed)
Stable

## 2014-12-12 NOTE — Assessment & Plan Note (Signed)
Continue with current prescription therapy as reflected on the Med list.  

## 2014-12-12 NOTE — Assessment & Plan Note (Signed)
Start Ketoconazole cream

## 2014-12-12 NOTE — Progress Notes (Signed)
Pre visit review using our clinic review tool, if applicable. No additional management support is needed unless otherwise documented below in the visit note. 

## 2014-12-12 NOTE — Progress Notes (Signed)
   Subjective:    HPI   C/o B LE swelling x weeks, no DVT on US. She as seen in ER on Sat  The patient presents for a follow-up of  chronic hypertension, chronic dyslipidemia, type 2 diabetes controlled with medicines   Wt Readings from Last 3 Encounters:  12/12/14 191 lb (86.637 kg)  11/29/14 194 lb 6.4 oz (88.179 kg)  09/12/14 188 lb (85.276 kg)   BP Readings from Last 3 Encounters:  12/12/14 110/60  12/09/14 135/49  11/29/14 132/68      Review of Systems  Constitutional: Positive for unexpected weight change. Negative for chills, activity change, appetite change and fatigue.  HENT: Negative for congestion, mouth sores and sinus pressure.   Eyes: Negative for visual disturbance.  Respiratory: Negative for cough and chest tightness.   Gastrointestinal: Negative for nausea, vomiting and abdominal pain.  Genitourinary: Negative for urgency, frequency, hematuria, flank pain, difficulty urinating and vaginal pain.  Musculoskeletal: Negative for back pain and gait problem.  Skin: Negative for pallor, rash and wound.  Neurological: Positive for tremors. Negative for dizziness, weakness, numbness and headaches.  Psychiatric/Behavioral: Positive for sleep disturbance. Negative for suicidal ideas and confusion. The patient is nervous/anxious.        Objective:   Physical Exam  Constitutional: She appears well-developed. No distress.  HENT:  Head: Normocephalic.  Right Ear: External ear normal.  Left Ear: External ear normal.  Nose: Nose normal.  Mouth/Throat: Oropharynx is clear and moist.  Eyes: Conjunctivae are normal. Pupils are equal, round, and reactive to light. Right eye exhibits no discharge. Left eye exhibits no discharge.  Neck: Normal range of motion. Neck supple. No JVD present. No tracheal deviation present. No thyromegaly present.  Cardiovascular: Normal rate, regular rhythm and normal heart sounds.   Pulmonary/Chest: No stridor. No respiratory distress. She  has no wheezes.  Abdominal: Soft. Bowel sounds are normal. She exhibits no distension and no mass. There is no tenderness. There is no rebound and no guarding.  Musculoskeletal: She exhibits no edema or tenderness.  Lymphadenopathy:    She has no cervical adenopathy.  Neurological: She displays normal reflexes. No cranial nerve deficit. She exhibits normal muscle tone. Coordination abnormal.  head tremor ++ Hands tremor +   Skin: No rash noted. No erythema.  Psychiatric: She has a normal mood and affect. Her behavior is normal. Judgment and thought content normal.  Trace edema B, R>L    Lab Results  Component Value Date   WBC 5.8 12/09/2014   HGB 11.0* 12/09/2014   HCT 33.0* 12/09/2014   PLT 301 12/09/2014   GLUCOSE 139* 12/09/2014   CHOL 141 11/12/2014   TRIG 129.0 11/12/2014   HDL 44.30 11/12/2014   LDLDIRECT 130.4 11/02/2013   LDLCALC 71 11/12/2014   ALT 9 11/12/2014   AST 20 11/12/2014   NA 141 12/09/2014   K 3.7 12/09/2014   CL 105 12/09/2014   CREATININE 1.12* 12/09/2014   BUN 21 12/09/2014   CO2 20 12/09/2014   TSH 1.84 02/08/2014   HGBA1C 6.1 09/12/2014   MICROALBUR 6.3* 05/10/2013        Assessment & Plan:

## 2014-12-13 ENCOUNTER — Other Ambulatory Visit: Payer: Self-pay | Admitting: Neurology

## 2014-12-18 ENCOUNTER — Ambulatory Visit (INDEPENDENT_AMBULATORY_CARE_PROVIDER_SITE_OTHER): Payer: Medicare Other | Admitting: Internal Medicine

## 2014-12-18 ENCOUNTER — Encounter: Payer: Self-pay | Admitting: Internal Medicine

## 2014-12-18 VITALS — BP 140/64 | HR 59 | Temp 97.6°F | Ht 63.0 in | Wt 193.1 lb

## 2014-12-18 DIAGNOSIS — B029 Zoster without complications: Secondary | ICD-10-CM

## 2014-12-18 MED ORDER — FAMCICLOVIR 500 MG PO TABS: 500.0000 mg | ORAL_TABLET | Freq: Three times a day (TID) | ORAL | Status: DC

## 2014-12-18 NOTE — Progress Notes (Signed)
   Subjective:    Patient ID: [name], female    DOB: 03/26/1946, 69 y.o.   MRN: [id number]  HPI  Symptoms began 3 days ago as itchy rash at the left knee. She had no associated constitutional symptoms.  She does describe pain from the left knee down the right medial shin area as a cramping pain; but this is been present intermittently several months.  She recently has had a yeast infection in the left inguinal area which responded to topical antifungal.  She has had a shingles shot.      Review of Systems    She has no extrinsic symptoms of itchy, watery eyes, sneezing. She has no angioedema.  She has no associated wheezing.Stable cough related to COPD.  She denies any fever, chills, sweats, weight loss  She's had no associated diarrhea.      Objective:   Physical Exam Pertinent or positive findings include: She has a bobbing head tremor. Appears younger than stated age Grade 1/2-1 systolic murmur is present; this is harsh in character . She has minor rales at the bases without increased work of breathing. There is an localized erythematous patch 2X1 cm over the left patella with multiple vesicular lesions There are or similar excoriated lesions at the lower left lumbosacral area w/o associated erythema.The patient had not mentioned this; her daughter noted this after I told her to monitor for any other lesions of the back or leg. Pedal pulses are decreased but equal  General appearance :adequately nourished; in no distress. Eyes: No conjunctival inflammation or scleral icterus is present. Oral exam: There is no oropharyngeal erythema, intraoral lesions or exudate noted.  Heart:  Normal rate and regular rhythm. S1 and S2 normal without gallop, click, rub or other extra sounds  Vascular : all pulses equal ; no bruits present. Skin:Warm & dry. no jaundice or tenting Lymphatic: No lymphadenopathy is noted about the head, neck, axilla         Assessment &  Plan:   #1 herpes zoster @ L 5  The pathophysiology of herpes zoster infection was discussed. Restrictions were also delineated. Medications prescribed and indications discussed.  Note: I mistakingly printed AVS on another patient's chart.Her daughter called from the pharmacy & I apologized. Famvir was sent to the pharmacy.Although not listed among "Allergies"; her daughter stated Gabapentin had not been tolerated when Rxed from ER. This was added to allergy list

## 2014-12-18 NOTE — Progress Notes (Signed)
Pre visit review using our clinic review tool, if applicable. No additional management support is needed unless otherwise documented below in the visit note. 

## 2014-12-18 NOTE — Patient Instructions (Signed)
After having herpes zoster or shingles;  a natural immunity against recurrent shingles should be present for approximately 24 months.

## 2015-01-07 ENCOUNTER — Telehealth: Payer: Self-pay | Admitting: Internal Medicine

## 2015-01-07 ENCOUNTER — Other Ambulatory Visit: Payer: Self-pay | Admitting: Internal Medicine

## 2015-01-07 NOTE — Telephone Encounter (Signed)
Pt's daughter in-law request phone call concern about prolia. Pt was wondering if we can speak to her about this, she think she might need to take this injection due to her bone is aching.

## 2015-01-09 NOTE — Telephone Encounter (Signed)
Left mess for patient/daughter in law  to call back.

## 2015-01-14 ENCOUNTER — Telehealth: Payer: Self-pay | Admitting: Internal Medicine

## 2015-01-14 NOTE — Telephone Encounter (Signed)
Pt called in requesting refill on her   HYDROcodone-acetaminophen (NORCO/VICODIN) 5-325 MG per tablet [[id number]][[id number]]

## 2015-01-14 NOTE — Telephone Encounter (Signed)
OK to fill this prescription with additional refills x0 Thank you!  

## 2015-01-16 MED ORDER — HYDROCODONE-ACETAMINOPHEN 5-325 MG PO TABS: 1.0000 | ORAL_TABLET | Freq: Four times a day (QID) | ORAL | Status: DC | PRN

## 2015-01-16 NOTE — Telephone Encounter (Signed)
Rx printed/signed/Pt's daughter informed ready to p/u.

## 2015-01-21 ENCOUNTER — Telehealth: Payer: Self-pay | Admitting: Internal Medicine

## 2015-01-21 NOTE — Telephone Encounter (Signed)
Left mess for patient to call back.  

## 2015-01-21 NOTE — Telephone Encounter (Signed)
Patient Name: Isabella PilgrimGLENDA [name]  DOB: 12/13/1945    Initial Comment Caller states her mother in law is complaining of pain shooting from breast to her back   Nurse Assessment  Nurse: Deatra JamesNoe, RN, Corrie DandyMary Date/Time Lamount Cohen(Eastern Time): 01/21/2015 1:08:54 PM  Confirm and document reason for call. If symptomatic, describe symptoms. ---Caller states patient is complaining of pain going from the right breast into her back for a few months  Has the patient traveled out of the country within the last 30 days? ---No  Does the patient require triage? ---Yes  Related visit to physician within the last 2 weeks? ---No  Does the PT have any chronic conditions? (i.e. diabetes, asthma, etc.) ---Yes  List chronic conditions. ---"diabetes, dementia, COPD"     Guidelines    Guideline Title Affirmed Question Affirmed Notes  Chest Pain [1] Chest pain lasting <= 5 minutes AND [2] NO chest pain or cardiac symptoms now (Exceptions: pains lasting a few seconds)    Final Disposition User   See Physician within 24 Hours AshtonNoe, RN, [place]

## 2015-01-22 ENCOUNTER — Ambulatory Visit (INDEPENDENT_AMBULATORY_CARE_PROVIDER_SITE_OTHER): Payer: Medicare Other | Admitting: Internal Medicine

## 2015-01-22 ENCOUNTER — Encounter: Payer: Self-pay | Admitting: Internal Medicine

## 2015-01-22 VITALS — BP 138/72 | HR 62 | Temp 97.3°F | Wt 189.0 lb

## 2015-01-22 DIAGNOSIS — Z1239 Encounter for other screening for malignant neoplasm of breast: Secondary | ICD-10-CM | POA: Diagnosis not present

## 2015-01-22 DIAGNOSIS — N644 Mastodynia: Secondary | ICD-10-CM | POA: Diagnosis not present

## 2015-01-22 NOTE — Telephone Encounter (Signed)
Pt was seen today by Dr. Dorise HissKollar for this.

## 2015-01-22 NOTE — Progress Notes (Signed)
   Subjective:    Patient ID: [name], female    DOB: 04/30/1946, 69 y.o.   MRN: [id number]  HPI The patient is a 69 YO female coming in for right breast pain. She has been having it for 3 months. It has been stable and unchanged. It comes daily and lasts about 5 minutes. The pain starts in the right breast and goes to the back. She denies associated nausea, vomiting, numbness, fevers, chills. Denies any lumps in her breast or nipple discharge.    Review of Systems  Constitutional: Negative for fever, activity change, appetite change, fatigue and unexpected weight change.  Respiratory: Negative.   Cardiovascular: Negative.   Gastrointestinal: Negative.   Genitourinary:       Breast pain right breast  Musculoskeletal: Negative.   Skin: Negative.       Objective:   Physical Exam  Constitutional: She appears well-developed and well-nourished.  Shaking during conversation, shaky with walking.   Cardiovascular: Normal rate.   Pulmonary/Chest: Effort normal and breath sounds normal. No respiratory distress. She has no wheezes. She has no rales. She exhibits tenderness.  Breast exam normal left and right breast, no lumps, rashes, nipple discharge, lymph nodes present. Some tenderness in certain spots, no tenderness with deep breathing.   Abdominal: Soft. She exhibits no distension. There is no tenderness.   Filed Vitals:   01/22/15 1410  BP: 138/72  Pulse: 62  Temp: 97.3 F (36.3 C)  TempSrc: Oral  Weight: 189 lb (85.73 kg)  SpO2: 97%      Assessment & Plan:

## 2015-01-22 NOTE — Assessment & Plan Note (Signed)
No lumps on exam to suggest problem with her breast. Will recommend screening mammogram (last 1 year ago). Advised heating pad and tylenol for pain as she is already on multiple sedating medications.

## 2015-01-22 NOTE — Patient Instructions (Signed)
We have put in for the screening mammogram. We did not find any problems with the breast today on exam. It is likely the muscle beneath the breast that is causing the problem.   Use a heating pad on the area to help with the pain. You can use tylenol over the counter for pain as well. Follow the directions on the bottle.   Call us back if you have more problems or questions.

## 2015-01-22 NOTE — Progress Notes (Signed)
Pre visit review using our clinic review tool, if applicable. No additional management support is needed unless otherwise documented below in the visit note. 

## 2015-01-23 ENCOUNTER — Telehealth: Payer: Self-pay | Admitting: *Deleted

## 2015-01-23 NOTE — Telephone Encounter (Signed)
[facility] - Client TELEPHONE ADVICE RECORD [facility] Patient Name: Isabella PilgrimGLENDA [name] Gender: Female DOB: 04/19/1946 Age: [age] Y 11 M 18 D Return Phone Number: [phone] (Primary) Address: [address] [place]State[place]Zip: [place] [address] Client [facility] - Client Client Site [facility] Day Physician Plotnikov, Alex Contact Type Call Call Type Triage / Clinical Caller Name [name] Relationship To Patient Daughter Appointment Disposition EMR Appointment Scheduled Return Phone Number [phone] (Primary) Chief Complaint Pain - Generalized Initial Comment Caller states her mother in law is complaining of pain shooting from breast to her back GOTO Facility Not Listed Appt for 2:30pm tomorrow with Dr. Dorise HissKollar' PreDisposition Call Doctor Info pasted into Epic Yes Nurse Assessment Nurse: Deatra JamesNoe, RN, Corrie DandyMary Date/Time Lamount Cohen(Eastern Time): 01/21/2015 1:08:54 PM Confirm and document reason for call. If symptomatic, describe symptoms. ---Caller states patient is complaining of pain going from the right breast into her back for a few months Has the patient traveled out of the country within the last 30 days? ---No Does the patient require triage? ---Yes Related visit to physician within the last 2 weeks? ---No Does the PT have any chronic conditions? (i.e. diabetes, asthma, etc.) ---Yes List chronic conditions. ---"diabetes, dementia, COPD" Guidelines Guideline Title Affirmed Question Affirmed Notes Nurse Date/Time Lamount Cohen(Eastern Time) Chest Pain [1] Chest pain lasting <= 5 minutes AND [2] NO chest pain or cardiac symptoms now (Exceptions: pains lasting a few seconds) Deatra Jamesoe, RN, Corrie DandyMary 01/21/2015 1:10:16 PM Disp. Time Lamount Cohen(Eastern Time) Disposition Final User 01/21/2015 1:23:56 PM See Physician within 24 Hours Yes Noe, RN, Corrie DandyMary PLEASE NOTE: All timestamps contained within this report are represented as [place] Standard  Time. CONFIDENTIALTY NOTICE: This fax transmission is intended only for the addressee. It contains information that is legally privileged, confidential or otherwise protected from use or disclosure. If you are not the intended recipient, you are strictly prohibited from reviewing, disclosing, copying using or disseminating any of this information or taking any action in reliance on or regarding this information. If you have received this fax in error, please notify us immediately by telephone so that we can arrange for its return to us. Phone: [phone], Toll-Free: [phone], Fax: [phone] Page: 2 of 2 Call Id: [id number] Caller Understands: Yes Disagree/Comply: Comply Care Advice Given Per Guideline SEE PHYSICIAN WITHIN 24 HOURS: CALL BACK IF: * Difficulty breathing occurs * Chest pain increases in frequency, duration or severity * Chest pain lasts over 5 minutes * You become worse. CARE ADVICE given per Chest Pain (Adult) guideline. After Care Instructions Given Call Event Type User Date / Time Description

## 2015-01-29 ENCOUNTER — Encounter: Payer: Self-pay | Admitting: Internal Medicine

## 2015-01-29 ENCOUNTER — Other Ambulatory Visit (INDEPENDENT_AMBULATORY_CARE_PROVIDER_SITE_OTHER): Payer: Medicare Other

## 2015-01-29 ENCOUNTER — Ambulatory Visit (INDEPENDENT_AMBULATORY_CARE_PROVIDER_SITE_OTHER): Payer: Medicare Other | Admitting: Internal Medicine

## 2015-01-29 VITALS — BP 150/70 | HR 73 | Temp 97.8°F | Wt 188.0 lb

## 2015-01-29 DIAGNOSIS — E1165 Type 2 diabetes mellitus with hyperglycemia: Secondary | ICD-10-CM

## 2015-01-29 DIAGNOSIS — E669 Obesity, unspecified: Secondary | ICD-10-CM | POA: Diagnosis not present

## 2015-01-29 DIAGNOSIS — IMO0002 Reserved for concepts with insufficient information to code with codable children: Secondary | ICD-10-CM

## 2015-01-29 DIAGNOSIS — E538 Deficiency of other specified B group vitamins: Secondary | ICD-10-CM | POA: Diagnosis not present

## 2015-01-29 DIAGNOSIS — N644 Mastodynia: Secondary | ICD-10-CM | POA: Diagnosis not present

## 2015-01-29 DIAGNOSIS — E1129 Type 2 diabetes mellitus with other diabetic kidney complication: Secondary | ICD-10-CM

## 2015-01-29 LAB — BASIC METABOLIC PANEL
BUN: 25 mg/dL — ABNORMAL HIGH (ref 6–23)
CO2: 33 meq/L — AB (ref 19–32)
CREATININE: 1.2 mg/dL (ref 0.40–1.20)
Calcium: 10.4 mg/dL (ref 8.4–10.5)
Chloride: 103 mEq/L (ref 96–112)
GFR: 47.07 mL/min — ABNORMAL LOW (ref >60.00)
Glucose, Bld: 98 mg/dL (ref 70–99)
Potassium: 3.5 mEq/L (ref 3.5–5.1)
Sodium: 142 mEq/L (ref 135–145)

## 2015-01-29 LAB — VITAMIN B12: Vitamin B-12: 298 pg/mL (ref 211–911)

## 2015-01-29 LAB — HEMOGLOBIN A1C: Hgb A1c MFr Bld: 6.5 % (ref 4.6–6.5)

## 2015-01-29 LAB — TSH: TSH: 0.66 u[IU]/mL (ref 0.35–4.50)

## 2015-01-29 MED ORDER — HYDROCODONE-ACETAMINOPHEN 5-325 MG PO TABS: 1.0000 | ORAL_TABLET | Freq: Four times a day (QID) | ORAL | Status: DC | PRN

## 2015-01-29 MED ORDER — HYDROCODONE-ACETAMINOPHEN 5-325 MG PO TABS
1.0000 | ORAL_TABLET | Freq: Four times a day (QID) | ORAL | Status: DC | PRN
Start: 2015-01-29 — End: 2015-05-22

## 2015-01-29 NOTE — Assessment & Plan Note (Signed)
Cont w/meds - metformin, basal insulin, pioglitazone Labs

## 2015-01-29 NOTE — Progress Notes (Signed)
Pre visit review using our clinic review tool, if applicable. No additional management support is needed unless otherwise documented below in the visit note. 

## 2015-01-29 NOTE — Assessment & Plan Note (Signed)
C/o R chest and breast shooting pain off and on x 1 year - Dr Dorise HissKollar examined Mammogram CXR

## 2015-01-29 NOTE — Assessment & Plan Note (Signed)
Re-start B12 Labs 

## 2015-01-29 NOTE — Progress Notes (Signed)
   Subjective:    HPI  C/o R chest and breast shooting pain off and on x 1 year - Dr Dorise HissKollar examined. CXR was ok in 10/16  F/u B LE swelling x weeks, no DVT on US. She as seen in ER on Sat  The patient presents for a follow-up of  chronic hypertension, chronic dyslipidemia, type 2 diabetes controlled with medicines  C/o diarrhea x months   Wt Readings from Last 3 Encounters:  01/29/15 188 lb (85.276 kg)  01/22/15 189 lb (85.73 kg)  12/18/14 193 lb 2 oz (87.601 kg)   BP Readings from Last 3 Encounters:  01/29/15 150/70  01/22/15 138/72  12/18/14 140/64      Review of Systems  Constitutional: Positive for unexpected weight change. Negative for chills, activity change, appetite change and fatigue.  HENT: Negative for congestion, mouth sores and sinus pressure.   Eyes: Negative for visual disturbance.  Respiratory: Negative for cough and chest tightness.   Gastrointestinal: Negative for nausea, vomiting and abdominal pain.  Genitourinary: Negative for urgency, frequency, hematuria, flank pain, difficulty urinating and vaginal pain.  Musculoskeletal: Negative for back pain and gait problem.  Skin: Negative for pallor, rash and wound.  Neurological: Positive for tremors. Negative for dizziness, weakness, numbness and headaches.  Psychiatric/Behavioral: Positive for sleep disturbance. Negative for suicidal ideas and confusion. The patient is nervous/anxious.        Objective:   Physical Exam  Constitutional: She appears well-developed. No distress.  HENT:  Head: Normocephalic.  Right Ear: External ear normal.  Left Ear: External ear normal.  Nose: Nose normal.  Mouth/Throat: Oropharynx is clear and moist.  Eyes: Conjunctivae are normal. Pupils are equal, round, and reactive to light. Right eye exhibits no discharge. Left eye exhibits no discharge.  Neck: Normal range of motion. Neck supple. No JVD present. No tracheal deviation present. No thyromegaly present.    Cardiovascular: Normal rate, regular rhythm and normal heart sounds.   Pulmonary/Chest: No stridor. No respiratory distress. She has no wheezes.  Abdominal: Soft. Bowel sounds are normal. She exhibits no distension and no mass. There is no tenderness. There is no rebound and no guarding.  Musculoskeletal: She exhibits no edema or tenderness.  Lymphadenopathy:    She has no cervical adenopathy.  Neurological: She displays normal reflexes. No cranial nerve deficit. She exhibits normal muscle tone. Coordination abnormal.  head tremor ++ Hands tremor +   Skin: No rash noted. No erythema.  Psychiatric: She has a normal mood and affect. Her behavior is normal. Judgment and thought content normal.  Trace edema B, R>L    Lab Results  Component Value Date   WBC 5.8 12/09/2014   HGB 11.0* 12/09/2014   HCT 33.0* 12/09/2014   PLT 301 12/09/2014   GLUCOSE 139* 12/09/2014   CHOL 141 11/12/2014   TRIG 129.0 11/12/2014   HDL 44.30 11/12/2014   LDLDIRECT 130.4 11/02/2013   LDLCALC 71 11/12/2014   ALT 9 11/12/2014   AST 20 11/12/2014   NA 141 12/09/2014   K 3.7 12/09/2014   CL 105 12/09/2014   CREATININE 1.12* 12/09/2014   BUN 21 12/09/2014   CO2 20 12/09/2014   TSH 1.84 02/08/2014   HGBA1C 6.1 09/12/2014   MICROALBUR 6.3* 05/10/2013        Assessment & Plan:

## 2015-02-04 ENCOUNTER — Telehealth: Payer: Self-pay | Admitting: Internal Medicine

## 2015-02-04 MED ORDER — AMOXICILLIN-POT CLAVULANATE 875-125 MG PO TABS: 1.0000 | ORAL_TABLET | Freq: Two times a day (BID) | ORAL | Status: DC

## 2015-02-04 NOTE — Telephone Encounter (Signed)
Called spoke with pt. Wants RX for augmentin. C/o sinus HA, congestion, prod cough (yellow phlem), sinus HA. Please advise CDY thanks  Allergies  Allergen Reactions  . Aspirin Itching  . Codeine Itching  . Demerol Itching    Unknown-unconscious.  . Gabapentin     Rxed in ER; not tolerated  . Latex Itching  . Lipitor [Atorvastatin]     Urticaria   . Lisinopril Itching and Cough    REACTION: causes cough  . Zocor [Simvastatin]     Urticaria      Current Outpatient Prescriptions on File Prior to Visit  Medication Sig Dispense Refill  . albuterol (PROVENTIL HFA;VENTOLIN HFA) 108 (90 BASE) MCG/ACT inhaler Inhale 2 puffs into the lungs every 6 (six) hours as needed for wheezing or shortness of breath.    Marland Kitchen. azelastine (ASTELIN) 0.1 % nasal spray INSTILL 1 TO 2 SPRAYS INTO EACH NOSTRIL ONCE OR TWICE DAILY AS NEEDED 30 mL 0  . clonazePAM (KLONOPIN) 1 MG tablet TAKE 1 TO 2 TABLETS BY MOUTH EVERY DAY AT BEDTIME AS NEEDED FOR ANXIETY 60 tablet 3  . conjugated estrogens (PREMARIN) vaginal cream Place 1 Applicatorful vaginally 2 (two) times daily as needed (vaginal itching).     Marland Kitchen. donepezil (ARICEPT) 10 MG tablet TAKE 1 TABLET BY MOUTH EVERY NIGHT AT BEDTIME 90 tablet 0  . esomeprazole (NEXIUM) 40 MG capsule Take 40 mg by mouth daily at 12 noon.    . ezetimibe (ZETIA) 10 MG tablet Take 10 mg by mouth daily.    . famciclovir (FAMVIR) 500 MG tablet Take 1 tablet (500 mg total) by mouth 3 (three) times daily. 21 tablet 0  . fish oil-omega-3 fatty acids 1000 MG capsule Take 4,000 mg by mouth daily.     . furosemide (LASIX) 40 MG tablet Take 1.5 tablets (60 mg total) by mouth daily. 45 tablet 11  . HYDROcodone-acetaminophen (NORCO/VICODIN) 5-325 MG per tablet Take 1 tablet by mouth every 6 (six) hours as needed for moderate pain. Please fill on or after 04/16/15 120 tablet 0  . insulin glargine (LANTUS) 100 UNIT/ML injection Inject 0.4 mLs (40 Units total) into the skin daily. 10 mL 11  . ketoconazole  (NIZORAL) 2 % cream Apply 1 application topically daily. 45 g 1  . levalbuterol (XOPENEX) 0.63 MG/3ML nebulizer solution Take 1 ampule by nebulization every 6 (six) hours as needed. For wheezing    . levothyroxine (SYNTHROID, LEVOTHROID) 88 MCG tablet Take 88 mcg by mouth daily before breakfast.    . metFORMIN (GLUCOPHAGE) 1000 MG tablet Take 1 tablet (1,000 mg total) by mouth 2 (two) times daily with a meal. 60 tablet 3  . Multiple Vitamin (MULTIVITAMIN WITH MINERALS) TABS tablet Take 1 tablet by mouth daily.    Marland Kitchen. NAMENDA XR 28 MG CP24 TAKE 1 CAPSULE BY MOUTH DAILY 90 capsule 0  . pravastatin (PRAVACHOL) 20 MG tablet Take 1 tablet (20 mg total) by mouth every evening. 30 tablet 5  . SYMBICORT 80-4.5 MCG/ACT inhaler INHALE 2 PUFFS BY MOUTH TWICE DAILY 10.2 g 3  . tiotropium (SPIRIVA) 18 MCG inhalation capsule Place 18 mcg into inhaler and inhale daily.    . valsartan (DIOVAN) 160 MG tablet TAKE 1 TABLET BY MOUTH DAILY 30 tablet 5  . vitamin A 8000 UNIT capsule Take 8,000 Units by mouth daily.    . [DISCONTINUED] Calcium Carbonate-Vitamin D (CALCIUM 600/VITAMIN D) 600-400 MG-UNIT per tablet Take 1 tablet by mouth 2 (two) times daily.      . [  DISCONTINUED] sodium chloride (OCEAN) 0.65 % nasal spray Place 2 sprays into the nose as needed. TO OPEN NASAL BREATHING     No current facility-administered medications on file prior to visit.

## 2015-02-04 NOTE — Telephone Encounter (Signed)
Pt aware RX sent in. Nothing further needed 

## 2015-02-04 NOTE — Telephone Encounter (Signed)
Ok augmentin 875, # 20 , 1 twice daily

## 2015-02-06 ENCOUNTER — Encounter: Payer: Self-pay | Admitting: Adult Health

## 2015-02-06 ENCOUNTER — Ambulatory Visit (INDEPENDENT_AMBULATORY_CARE_PROVIDER_SITE_OTHER): Payer: Medicare Other | Admitting: Adult Health

## 2015-02-06 VITALS — BP 143/83 | HR 62 | Ht 63.0 in | Wt 189.0 lb

## 2015-02-06 DIAGNOSIS — R519 Headache, unspecified: Secondary | ICD-10-CM

## 2015-02-06 DIAGNOSIS — R51 Headache: Secondary | ICD-10-CM | POA: Diagnosis not present

## 2015-02-06 DIAGNOSIS — R413 Other amnesia: Secondary | ICD-10-CM | POA: Diagnosis not present

## 2015-02-06 NOTE — Patient Instructions (Signed)
Your memory score has remained stable. MMSE 24/30. Continue Aricept and Namenda We will check an MRI of the brain due to ongoing headaches Would try to limit taking Hydrocodone to only when you have severe pain.

## 2015-02-06 NOTE — Progress Notes (Signed)
PATIENT: [name] DOB: 09-13-1946  REASON FOR VISIT: follow up- memory loss HISTORY FROM: patient  HISTORY OF PRESENT ILLNESS: [name] is a 69 year old female with a history of memory loss. She returns today for an evaluation. She is currently taking Aricept and Namenda and is tolerating it well. Patient reports that her memory has improved. She is able to complete all ADLs independently-- she states that she may need help dressing at times.She lives with her son and daughter-in-law. The patient no longer operates a motor vehicle. Denies any agitation or aggressive behavior. Her son and daughter-in-law handle her finances. The patient no longer cooks. Her family provides her meals. The patient is sleeping well at night. The patient continues to complain of ongoing headaches. She states that usually her headaches are located on the top of her head but can be all over. She still states that she fell one year ago on her face and apparently broke some bones. She states since then she's had these headaches. She denies any photophobia or phonophobia. Denies any nausea or vomiting. She does take hydrocodone twice a day for "body aches." She denies any recent falls. The patient is again requesting have an MRI of the brain to evaluate the headaches. She did have an MRI of the brain in 2013 that just showed atrophy.  HISTORY: [name] is a 69 year old female with a history of memory loss. She returns today for an evaluation. She is currently taking Aricept and Namenda is tolerating it well. Daughter in law states that she tends to forget "little things" but nothing major. Daughter in law states that she helps her with her medication because she thinks she was forgetting that she took the clonazepam and she would take another dose. Denies having to give up anything due to her memory. She does limited cooking. The daughter in law will prepare large meals. She no longer drives. Her son and daughter  in law do all the finances. Patient does complain of a mild headache that occur throughout the month. She is requesting to have an MRI of the brain to prove to her family that she does not have dementia. Daughter in law explains that she has 2 daughters that are trying to take control of her finances. At this time the patient states she only wants her son to control her affairs.  HISTORY 11/17/13 [name]): 69 year old right-handed Caucasian female, accompanied by her family at visit , she is referred by primary care Dr. Filbert [name] for evaluation of memory loss, last clinical visit was August 2013  She has past medical history of hypertension, diabetes,hypothyroidism, depression anxiety, COPD  She had 10 year education, worked as Lawyer for 25 years. has 7 children, went on disability in 2004 due to gait difficulty,  She had gradual onset memory loss since 2012, she has labile glucose up to 400s, forget to take her insulin sometimes, increased difficulty over past few months, anxious, not herself, increased falling.  She also has 20 years history of unsteady gait, gradual onset, tends to veer to the right side. she has long-standing history of diabetes, but she denied bilateral lower extremity paresthesia, she previously had valgus toes bilaterally, requiring surgery.  MRI brain in 2012 showed Mild scattered periventricular and subcortical T2 hyperintensities consistent with chronic microvascular ischemic disease, enlarged, partially empty sella.  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  Repeat MRI of  brainin 2013 showed mild atrophy, small vessel disease, no acute lesions, laboratory showed A1c 8.2, elevated TSH, 6.97,  UPDATE 11/17/2013:  She has trouble remembering, forget grandchildren's name, could not name object. She could not remember conversation. She is eating well, she fell recently with right foot fracture. She sleeps well. She still helps house chore, sweeping floor,doing laundary. She does not cook any more.Overall she is stable, no longer has visual hallucination, less agitated,  She is taking Namenda 10 mg twice a day  REVIEW OF SYSTEMS: Out of a complete 14 system review of symptoms, the patient complains only of the following symptoms, and all other reviewed systems are negative.  Chills, fatigue, eye itching, runny nose, cold intolerance, diarrhea, snoring, itching, headache  ALLERGIES: Allergies  Allergen Reactions  . Aspirin Itching  . Codeine Itching  . Demerol Itching    Unknown-unconscious.  . Gabapentin     Rxed in ER; not tolerated  . Latex Itching  . Lipitor [Atorvastatin]     Urticaria   . Lisinopril Itching and Cough    REACTION: causes cough  . Zocor [Simvastatin]     Urticaria     HOME MEDICATIONS: Outpatient Prescriptions Prior to Visit  Medication Sig Dispense Refill  . albuterol (PROVENTIL HFA;VENTOLIN HFA) 108 (90 BASE) MCG/ACT inhaler Inhale 2 puffs into the lungs every 6 (six) hours as needed for wheezing or shortness of breath.    Marland Kitchen amoxicillin-clavulanate (AUGMENTIN) 875-125 MG per tablet Take 1 tablet by mouth 2 (two) times daily. 20 tablet 0  . azelastine (ASTELIN) 0.1 % nasal spray INSTILL 1 TO 2 SPRAYS INTO EACH NOSTRIL ONCE OR TWICE DAILY AS NEEDED 30 mL 0  . clonazePAM (KLONOPIN) 1 MG tablet TAKE 1 TO 2 TABLETS BY MOUTH EVERY DAY AT BEDTIME AS NEEDED FOR ANXIETY 60 tablet 3  . conjugated estrogens (PREMARIN) vaginal cream Place 1 Applicatorful vaginally 2 (two) times daily as needed (vaginal itching).     Marland Kitchen donepezil (ARICEPT) 10  MG tablet TAKE 1 TABLET BY MOUTH EVERY NIGHT AT BEDTIME 90 tablet 0  . esomeprazole (NEXIUM) 40 MG capsule Take 40 mg by mouth daily at 12 noon.    . famciclovir (FAMVIR) 500 MG tablet Take 1 tablet (500 mg total) by mouth 3 (three) times daily. 21 tablet 0  . fish oil-omega-3 fatty acids 1000 MG capsule Take 4,000 mg by mouth daily.     . furosemide (LASIX) 40 MG tablet Take 1.5 tablets (60 mg total) by mouth daily. 45 tablet 11  . HYDROcodone-acetaminophen (NORCO/VICODIN) 5-325 MG per tablet Take 1 tablet by mouth every 6 (six) hours as needed for moderate pain. Please fill on or after 04/16/15 120 tablet 0  . insulin glargine (LANTUS) 100 UNIT/ML injection Inject 0.4 mLs (40 Units total) into the skin daily. 10 mL 11  . ketoconazole (NIZORAL) 2 % cream Apply 1 application topically daily. 45 g 1  . levalbuterol (XOPENEX) 0.63 MG/3ML nebulizer solution Take 1 ampule by nebulization every 6 (six) hours as needed. For wheezing    . levothyroxine (SYNTHROID, LEVOTHROID) 88 MCG tablet Take 88 mcg by mouth daily before breakfast.    .  metFORMIN (GLUCOPHAGE) 1000 MG tablet Take 1 tablet (1,000 mg total) by mouth 2 (two) times daily with a meal. 60 tablet 3  . Multiple Vitamin (MULTIVITAMIN WITH MINERALS) TABS tablet Take 1 tablet by mouth daily.    Marland Kitchen NAMENDA XR 28 MG CP24 TAKE 1 CAPSULE BY MOUTH DAILY 90 capsule 0  . pravastatin (PRAVACHOL) 20 MG tablet Take 1 tablet (20 mg total) by mouth every evening. 30 tablet 5  . SYMBICORT 80-4.5 MCG/ACT inhaler INHALE 2 PUFFS BY MOUTH TWICE DAILY 10.2 g 3  . tiotropium (SPIRIVA) 18 MCG inhalation capsule Place 18 mcg into inhaler and inhale daily.    . valsartan (DIOVAN) 160 MG tablet TAKE 1 TABLET BY MOUTH DAILY 30 tablet 5  . vitamin A 8000 UNIT capsule Take 8,000 Units by mouth daily.    Marland Kitchen ezetimibe (ZETIA) 10 MG tablet Take 10 mg by mouth daily.     No facility-administered medications prior to visit.    PAST MEDICAL HISTORY: Past Medical History    Diagnosis Date  . Personal history of colonic polyps   . Iron deficiency anemia, unspecified   . Other malaise and fatigue   . Chest pain, unspecified   . Other B-complex deficiencies   . Arthritis     Right Sacroiliac joint  . Other and unspecified hyperlipidemia   . Anxiety and depression   . HTN (hypertension)   . Unspecified hypothyroidism   . Type II or unspecified type diabetes mellitus without mention of complication, not stated as uncontrolled   . Allergic rhinitis, cause unspecified   . Unspecified asthma(493.90)   . Chronic airway obstruction, not elsewhere classified   . Abnormal stress test 08/21/2008    Neg  . GERD (gastroesophageal reflux disease)     PAST SURGICAL HISTORY: Past Surgical History  Procedure Laterality Date  . Foot surgery      hammer toe 2004, 2005  . Abdominal hysterectomy  1982    menometorrhagia  . Carpal tunnel release  03/05/2009    bilateral  . Colonoscopy N/A 09/06/2013    Procedure: COLONOSCOPY;  Surgeon: Louis Meckel, MD;  Location: [facility];  Service: Endoscopy;  Laterality: N/A;  . Hot hemostasis N/A 09/06/2013    Procedure: HOT HEMOSTASIS (ARGON PLASMA COAGULATION/BICAP);  Surgeon: Louis Meckel, MD;  Location: [facility];  Service: Endoscopy;  Laterality: N/A;    FAMILY HISTORY: Family History  Problem Relation Age of Onset  . Hypertension Mother   . Diabetes Mother   . Heart disease Mother     CHF  . Coronary artery disease Mother   . Lung cancer Father   . Hypertension Father   . Diabetes Father   . Liver cancer Father     w/mets  . Prostate cancer Father   . Breast cancer Sister   . Lung disease Brother     Agent Orange Related  . Asthma Brother   . Allergies Brother   . Colon cancer Neg Hx     SOCIAL HISTORY: History   Social History  . Marital Status: Legally Separated    Spouse Name: N/A  . Number of Children: 6  . Years of Education: 10   Occupational History  .     Social History Main  Topics  . Smoking status: Former Smoker    Quit date: 06/01/1997  . Smokeless tobacco: Never Used     Comment: Quit 9 years ago.  . Alcohol Use: No  . Drug Use: No  .  Sexual Activity: Not on file   Other Topics Concern  . 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 No feather bedding, no mold. Son smokes. Pets including dogs, cats, 3 birds.    Angioedema with Aspirin.   Daily caffeine use two cups a day   Regular Exercise-yes               PHYSICAL EXAM  Filed Vitals:   02/06/15 0818  BP: 143/83  Pulse: 62  Height: 5\' 3"  (1.6 m)  Weight: 189 lb (85.73 kg)   Body mass index is 33.49 kg/(m^2).  Generalized: Well developed, in no acute distress   Neurological examination  Mentation: Alert oriented to time, place, history taking. Follows all commands speech and language fluent. MMSE 24/30 Cranial nerve II-XII: Pupils were equal round reactive to light. Extraocular movements were full, visual field were full on confrontational test. Facial sensation and strength were normal. Uvula tongue midline. Head turning and shoulder shrug  were normal and symmetric. Motor: The motor testing reveals 5 over 5 strength of all 4 extremities. Good symmetric motor tone is noted throughout.  Sensory: Sensory testing is intact to soft touch on all 4 extremities. No evidence of extinction is noted.  Coordination: Cerebellar testing reveals good finger-nose-finger and heel-to-shin bilaterally.  Gait and station: Gait is slow and cautious. Tandem gait is unsteady Romberg is negative. No drift is seen.  Reflexes: Deep tendon  reflexes are symmetric and normal bilaterally.  Marland Kitchen.   DIAGNOSTIC DATA (LABS, IMAGING, TESTING) - I reviewed patient records, labs, notes, testing and imaging myself where available.  MRI of the brain with and without contrast 06/23/2012: No acute abnormality. Atrophy and mild to moderate chronic mcrovascular ischemic changes in the white matter and pons.  Lab Results  Component Value Date   WBC 5.8 12/09/2014   HGB 11.0* 12/09/2014   HCT 33.0* 12/09/2014   MCV 90.4 12/09/2014   PLT 301 12/09/2014      Component Value Date/Time   NA 142 01/29/2015 1639   K 3.5 01/29/2015 1639   CL 103 01/29/2015 1639   CO2 33* 01/29/2015 1639   GLUCOSE 98 01/29/2015 1639   BUN 25* 01/29/2015 1639   CREATININE 1.20 01/29/2015 1639   CALCIUM 10.4 01/29/2015 1639   PROT 6.2 11/12/2014 0748   ALBUMIN 3.3* 11/12/2014 0748   AST 20 11/12/2014 0748   ALT 9 11/12/2014 0748   ALKPHOS 56 11/12/2014 0748   BILITOT 0.6 11/12/2014 0748   GFRNONAA 49* 12/09/2014 1522   GFRAA 56* 12/09/2014 1522   Lab Results  Component Value Date   CHOL 141 11/12/2014   HDL 44.30 11/12/2014   LDLCALC 71 11/12/2014   LDLDIRECT 130.4 11/02/2013   TRIG 129.0 11/12/2014   CHOLHDL 3 11/12/2014   Lab Results  Component Value Date   HGBA1C 6.5 01/29/2015   Lab Results  Component Value Date   VITAMINB12 298 01/29/2015   Lab Results  Component Value Date   TSH 0.66 01/29/2015      ASSESSMENT AND PLAN 69 y.o. year old female  has a  past medical history of Personal history of colonic polyps; Iron deficiency anemia, unspecified; Other malaise and fatigue; Chest pain, unspecified; Other B-complex deficiencies; Arthritis; Other and unspecified hyperlipidemia; Anxiety and depression; HTN (hypertension); Unspecified hypothyroidism; Type II or unspecified type diabetes mellitus without mention of complication, not stated as uncontrolled; Allergic rhinitis, cause unspecified; Unspecified asthma(493.90); Chronic airway  obstruction, not elsewhere classified; Abnormal stress test (08/21/2008); and GERD (gastroesophageal reflux disease). here with:  1. Memory loss 2. Headache  Overall the patient's memory has remained stable. Her MMSE 24/30 was  previously 22/30. She will continue taking Aricept and Namenda. The patient continues to complain of an ongoing headache that occurred after a fall one year ago. She is requesting have an MRI of the brain. I will order an MRI of the brain looking for any changes that could contribute her headaches. I have also advised the patient that she should limit her use of hydrocodone to only when she is in severe pain. She was advised that if her symptoms worsen or she develops new symptoms she she'll let us know. Otherwise she will follow-up in 6 months with Dr. Terrace [name] or sooner if needed.   Isabella Penny, MSN, NP-C 02/06/2015, 8:26 AM [facility] [address] [place], [place] [address] [phone]  Note: This document was prepared with digital dictation and possible smart phrase technology. Any transcriptional errors that result from this process are unintentional.

## 2015-02-07 ENCOUNTER — Telehealth: Payer: Self-pay | Admitting: Internal Medicine

## 2015-02-07 ENCOUNTER — Telehealth: Payer: Self-pay

## 2015-02-07 NOTE — Telephone Encounter (Signed)
Duplicate phone note about same problem/questions. Closing this one. See other telephone encounter dated 02/07/15.

## 2015-02-07 NOTE — Telephone Encounter (Signed)
Could any of her meds be causing this? Anything she can do for hair loss?

## 2015-02-07 NOTE — Telephone Encounter (Signed)
Spoke to patient's  daughter in law she is on  Hip pa relayed Namenda and Aricept have  know side effects  Of  hair loss. Stated to daughter in law she can contact PCP. Daughter in law understood process and was fine with this.

## 2015-02-07 NOTE — Telephone Encounter (Signed)
Per PCP: I informed pt that her Hair loss could possibly be caused from some of her meds (levothyroxine). She should take Biotin.

## 2015-02-07 NOTE — Telephone Encounter (Signed)
Patient is requesting a call. She states that her hair is falling out. She states that she is not able to come in to be seen.

## 2015-02-07 NOTE — Telephone Encounter (Signed)
Pt called asking to see if any of her medications that is taking might have cause her hair to fall out. Please advise.

## 2015-02-07 NOTE — Telephone Encounter (Signed)
Called patient and she wants to know if Aundra MilletMegan can look over her medication list. Patient states she is having a lot of hair loss and want's to know if it's any of her medication ?

## 2015-02-08 DIAGNOSIS — N644 Mastodynia: Secondary | ICD-10-CM | POA: Diagnosis not present

## 2015-02-11 ENCOUNTER — Other Ambulatory Visit: Payer: Self-pay | Admitting: Neurology

## 2015-02-13 ENCOUNTER — Ambulatory Visit
Admission: RE | Admit: 2015-02-13 | Discharge: 2015-02-13 | Disposition: A | Discharge status: Home / Self Care | Payer: PRIVATE HEALTH INSURANCE | Source: Ambulatory Visit | Attending: Adult Health | Admitting: Adult Health

## 2015-02-13 DIAGNOSIS — R51 Headache: Secondary | ICD-10-CM | POA: Diagnosis not present

## 2015-02-13 DIAGNOSIS — R519 Headache, unspecified: Secondary | ICD-10-CM

## 2015-02-13 MED ORDER — GADOBENATE DIMEGLUMINE 529 MG/ML IV SOLN
9.0000 mL | Freq: Once | INTRAVENOUS | Status: AC | PRN
  Administered 2015-02-13: 9 mL via INTRAVENOUS

## 2015-02-14 ENCOUNTER — Telehealth: Payer: Self-pay

## 2015-02-14 NOTE — Telephone Encounter (Signed)
Called and relayed MRI results No change MRI 2013. Spoke to daughter in law she is on Hip pa.

## 2015-02-14 NOTE — Progress Notes (Signed)
Quick Note:  Called and spoke to daughter in law , she is on her Hip- pa relayed Results. No significant change sine 05/2012. ______

## 2015-02-25 ENCOUNTER — Other Ambulatory Visit: Payer: Self-pay | Admitting: Internal Medicine

## 2015-02-26 NOTE — Telephone Encounter (Signed)
OK to fill this Clonazepam prescription with additional refills x2 Thank you!

## 2015-02-26 NOTE — Telephone Encounter (Signed)
Pt is out of this med, please help

## 2015-02-27 NOTE — Telephone Encounter (Signed)
Done. Left detailed mess informing pt's daughter.

## 2015-02-27 NOTE — Telephone Encounter (Signed)
Patient called back regarding the status on this prescription

## 2015-03-06 ENCOUNTER — Telehealth: Payer: Self-pay | Admitting: Internal Medicine

## 2015-03-06 NOTE — Telephone Encounter (Signed)
Isabella LeeSabrina called in and said that pt need note for jury duty that she is disabled.    Court date is 04/17/2015   Best number [phone]

## 2015-03-06 NOTE — Telephone Encounter (Signed)
Delaney Meigsamara, pls write a letter Thx

## 2015-03-07 ENCOUNTER — Telehealth: Payer: Self-pay | Admitting: Internal Medicine

## 2015-03-07 NOTE — Telephone Encounter (Signed)
Patient was wondering about the drug Prolia and if it would help her with her knees, arms and leg pains. Please call sabrina

## 2015-03-07 NOTE — Telephone Encounter (Signed)
done

## 2015-03-11 ENCOUNTER — Ambulatory Visit: Payer: PRIVATE HEALTH INSURANCE | Admitting: Internal Medicine

## 2015-03-13 ENCOUNTER — Ambulatory Visit (INDEPENDENT_AMBULATORY_CARE_PROVIDER_SITE_OTHER): Payer: PRIVATE HEALTH INSURANCE | Admitting: Internal Medicine

## 2015-03-13 ENCOUNTER — Encounter: Payer: Self-pay | Admitting: Internal Medicine

## 2015-03-13 VITALS — BP 126/68 | HR 66 | Temp 97.4°F | Resp 18 | Ht 63.0 in | Wt 187.0 lb

## 2015-03-13 DIAGNOSIS — I10 Essential (primary) hypertension: Secondary | ICD-10-CM | POA: Diagnosis not present

## 2015-03-13 DIAGNOSIS — E538 Deficiency of other specified B group vitamins: Secondary | ICD-10-CM | POA: Diagnosis not present

## 2015-03-13 DIAGNOSIS — J449 Chronic obstructive pulmonary disease, unspecified: Secondary | ICD-10-CM | POA: Diagnosis not present

## 2015-03-13 DIAGNOSIS — E1129 Type 2 diabetes mellitus with other diabetic kidney complication: Secondary | ICD-10-CM

## 2015-03-13 DIAGNOSIS — Z0000 Encounter for general adult medical examination without abnormal findings: Secondary | ICD-10-CM | POA: Diagnosis not present

## 2015-03-13 DIAGNOSIS — IMO0002 Reserved for concepts with insufficient information to code with codable children: Secondary | ICD-10-CM

## 2015-03-13 DIAGNOSIS — E1165 Type 2 diabetes mellitus with hyperglycemia: Secondary | ICD-10-CM

## 2015-03-13 MED ORDER — "INSULIN SYRINGE 29G X 1/2"" 1 ML MISC": Status: DC

## 2015-03-13 MED ORDER — CYANOCOBALAMIN 1000 MCG/ML IJ SOLN: 1000.0000 ug | INTRAMUSCULAR | Status: DC

## 2015-03-13 MED ORDER — CYANOCOBALAMIN 1000 MCG/ML IJ SOLN
1000.0000 ug | Freq: Once | INTRAMUSCULAR | Status: AC
  Administered 2015-03-13: 1000 ug via INTRAMUSCULAR

## 2015-03-13 NOTE — Assessment & Plan Note (Signed)
Proventil MDI 

## 2015-03-13 NOTE — Progress Notes (Signed)
   Subjective:    HPI  The patient is here for a wellness exam.   F/u B LE swelling x weeks, no DVT on US. She as seen in ER on Sat  The patient presents for a follow-up of  chronic hypertension, chronic dyslipidemia, type 2 diabetes controlled with medicines, B12 def     Wt Readings from Last 3 Encounters:  03/13/15 187 lb (84.823 kg)  02/06/15 189 lb (85.73 kg)  01/29/15 188 lb (85.276 kg)   BP Readings from Last 3 Encounters:  03/13/15 126/68  02/06/15 143/83  01/29/15 150/70      Review of Systems  Constitutional: Positive for unexpected weight change. Negative for chills, activity change, appetite change and fatigue.  HENT: Negative for congestion, mouth sores and sinus pressure.   Eyes: Negative for visual disturbance.  Respiratory: Negative for cough and chest tightness.   Gastrointestinal: Negative for nausea, vomiting and abdominal pain.  Genitourinary: Negative for urgency, frequency, hematuria, flank pain, difficulty urinating and vaginal pain.  Musculoskeletal: Negative for back pain and gait problem.  Skin: Negative for pallor, rash and wound.  Neurological: Positive for tremors. Negative for dizziness, weakness, numbness and headaches.  Psychiatric/Behavioral: Positive for sleep disturbance. Negative for suicidal ideas and confusion. The patient is nervous/anxious.        Objective:   Physical Exam  Constitutional: She appears well-developed. No distress.  HENT:  Head: Normocephalic.  Right Ear: External ear normal.  Left Ear: External ear normal.  Nose: Nose normal.  Mouth/Throat: Oropharynx is clear and moist.  Eyes: Conjunctivae are normal. Pupils are equal, round, and reactive to light. Right eye exhibits no discharge. Left eye exhibits no discharge.  Neck: Normal range of motion. Neck supple. No JVD present. No tracheal deviation present. No thyromegaly present.  Cardiovascular: Normal rate, regular rhythm and normal heart sounds.     Pulmonary/Chest: No stridor. No respiratory distress. She has no wheezes.  Abdominal: Soft. Bowel sounds are normal. She exhibits no distension and no mass. There is no tenderness. There is no rebound and no guarding.  Musculoskeletal: She exhibits no edema or tenderness.  Lymphadenopathy:    She has no cervical adenopathy.  Neurological: She displays normal reflexes. No cranial nerve deficit. She exhibits normal muscle tone. Coordination abnormal.  head tremor ++ Hands tremor +   Skin: No rash noted. No erythema.  Psychiatric: She has a normal mood and affect. Her behavior is normal. Judgment and thought content normal.  Trace edema B, R>L    Lab Results  Component Value Date   WBC 5.8 12/09/2014   HGB 11.0* 12/09/2014   HCT 33.0* 12/09/2014   PLT 301 12/09/2014   GLUCOSE 98 01/29/2015   CHOL 141 11/12/2014   TRIG 129.0 11/12/2014   HDL 44.30 11/12/2014   LDLDIRECT 130.4 11/02/2013   LDLCALC 71 11/12/2014   ALT 9 11/12/2014   AST 20 11/12/2014   NA 142 01/29/2015   K 3.5 01/29/2015   CL 103 01/29/2015   CREATININE 1.20 01/29/2015   BUN 25* 01/29/2015   CO2 33* 01/29/2015   TSH 0.66 01/29/2015   HGBA1C 6.5 01/29/2015   MICROALBUR 6.3* 05/10/2013        Assessment & Plan:

## 2015-03-13 NOTE — Assessment & Plan Note (Signed)
Cont w/meds - metformin, basal insulin - Lantus, pioglitazone

## 2015-03-13 NOTE — Assessment & Plan Note (Addendum)
Here for medicare wellness/physical  Diet: heart healthy  Physical activity: sedentary  Depression/mood screen: negative  Hearing: intact to whispered voice  Visual acuity: grossly normal, performs annual eye exam  ADLs: capable  Roster reviewed Fall risk: none  Home safety: good  Cognitive evaluation: intact to orientation, naming, recall and repetition  EOL planning: adv directives, full code/ I agree  I have personally reviewed and have noted  1. The patient's medical, surgical and social history  2. Their use of alcohol, tobacco or illicit drugs  3. Their current medications and supplements  4. The patient's functional ability including ADL's, fall risks, home safety risks and hearing or visual impairment.  5. Diet and physical activities  6. Evidence for depression or mood disorders 7.List of doctors reviewed today    Today patient counseled on age appropriate routine health concerns for screening and prevention, each reviewed and up to date or declined. Immunizations reviewed and up to date or declined. Labs ordered and reviewed. Risk factors for depression reviewed and negative. Hearing function and visual acuity are intact. ADLs screened and addressed as needed. Functional ability and level of safety reviewed and appropriate. Education, counseling and referrals performed based on assessed risks today. Patient provided with a copy of personalized plan for preventive services.

## 2015-03-13 NOTE — Assessment & Plan Note (Signed)
Diovan, Furosemide Labs

## 2015-03-13 NOTE — Assessment & Plan Note (Signed)
Switch to Vit B12 1000 mcg sq q 14 days

## 2015-03-13 NOTE — Progress Notes (Signed)
Pre visit review using our clinic review tool, if applicable. No additional management support is needed unless otherwise documented below in the visit note. 

## 2015-03-16 ENCOUNTER — Other Ambulatory Visit: Payer: Self-pay | Admitting: Neurology

## 2015-03-19 NOTE — Telephone Encounter (Signed)
Pt came in 03/13/15 for cpx was discuss at visit...[name]/lmb

## 2015-03-20 ENCOUNTER — Telehealth: Payer: Self-pay | Admitting: Internal Medicine

## 2015-03-21 ENCOUNTER — Other Ambulatory Visit: Payer: Self-pay | Admitting: Internal Medicine

## 2015-03-21 NOTE — Telephone Encounter (Signed)
I couldn't find any anything in your notes that would suggest she had stopped her shots or that you had d/c'd her allergy vac..Marland Kitchen

## 2015-03-21 NOTE — Telephone Encounter (Signed)
Pt. Hasn't been in for a shot since 06/27/14.

## 2015-03-21 NOTE — Telephone Encounter (Signed)
Noted  

## 2015-03-21 NOTE — Telephone Encounter (Signed)
It has been almost a year, so consider her dc'd

## 2015-03-22 ENCOUNTER — Emergency Department (HOSPITAL_COMMUNITY)
Admission: EM | Admit: 2015-03-22 | Discharge: 2015-03-22 | Disposition: A | Discharge status: Home / Self Care | Payer: Medicare Other | Attending: Emergency Medicine | Admitting: Emergency Medicine

## 2015-03-22 ENCOUNTER — Emergency Department (HOSPITAL_COMMUNITY): Payer: Medicare Other

## 2015-03-22 ENCOUNTER — Encounter (HOSPITAL_COMMUNITY): Payer: Self-pay

## 2015-03-22 DIAGNOSIS — Z86010 Personal history of colonic polyps: Secondary | ICD-10-CM | POA: Diagnosis not present

## 2015-03-22 DIAGNOSIS — J189 Pneumonia, unspecified organism: Secondary | ICD-10-CM

## 2015-03-22 DIAGNOSIS — M19012 Primary osteoarthritis, left shoulder: Secondary | ICD-10-CM | POA: Diagnosis not present

## 2015-03-22 DIAGNOSIS — M25512 Pain in left shoulder: Secondary | ICD-10-CM | POA: Insufficient documentation

## 2015-03-22 DIAGNOSIS — I10 Essential (primary) hypertension: Secondary | ICD-10-CM | POA: Insufficient documentation

## 2015-03-22 DIAGNOSIS — K219 Gastro-esophageal reflux disease without esophagitis: Secondary | ICD-10-CM | POA: Insufficient documentation

## 2015-03-22 DIAGNOSIS — E119 Type 2 diabetes mellitus without complications: Secondary | ICD-10-CM | POA: Diagnosis not present

## 2015-03-22 DIAGNOSIS — R109 Unspecified abdominal pain: Secondary | ICD-10-CM | POA: Insufficient documentation

## 2015-03-22 DIAGNOSIS — J159 Unspecified bacterial pneumonia: Secondary | ICD-10-CM | POA: Diagnosis not present

## 2015-03-22 DIAGNOSIS — Z792 Long term (current) use of antibiotics: Secondary | ICD-10-CM | POA: Insufficient documentation

## 2015-03-22 DIAGNOSIS — Z79899 Other long term (current) drug therapy: Secondary | ICD-10-CM | POA: Diagnosis not present

## 2015-03-22 DIAGNOSIS — J449 Chronic obstructive pulmonary disease, unspecified: Secondary | ICD-10-CM | POA: Insufficient documentation

## 2015-03-22 DIAGNOSIS — Z794 Long term (current) use of insulin: Secondary | ICD-10-CM | POA: Diagnosis not present

## 2015-03-22 DIAGNOSIS — G8929 Other chronic pain: Secondary | ICD-10-CM | POA: Diagnosis not present

## 2015-03-22 DIAGNOSIS — Z87891 Personal history of nicotine dependence: Secondary | ICD-10-CM | POA: Insufficient documentation

## 2015-03-22 DIAGNOSIS — Z91040 Latex allergy status: Secondary | ICD-10-CM | POA: Diagnosis not present

## 2015-03-22 DIAGNOSIS — F329 Major depressive disorder, single episode, unspecified: Secondary | ICD-10-CM | POA: Insufficient documentation

## 2015-03-22 DIAGNOSIS — E039 Hypothyroidism, unspecified: Secondary | ICD-10-CM | POA: Insufficient documentation

## 2015-03-22 DIAGNOSIS — M1990 Unspecified osteoarthritis, unspecified site: Secondary | ICD-10-CM | POA: Insufficient documentation

## 2015-03-22 DIAGNOSIS — Z862 Personal history of diseases of the blood and blood-forming organs and certain disorders involving the immune mechanism: Secondary | ICD-10-CM | POA: Diagnosis not present

## 2015-03-22 DIAGNOSIS — R0602 Shortness of breath: Secondary | ICD-10-CM | POA: Diagnosis not present

## 2015-03-22 DIAGNOSIS — R05 Cough: Secondary | ICD-10-CM | POA: Diagnosis not present

## 2015-03-22 DIAGNOSIS — R072 Precordial pain: Secondary | ICD-10-CM | POA: Diagnosis not present

## 2015-03-22 DIAGNOSIS — F419 Anxiety disorder, unspecified: Secondary | ICD-10-CM | POA: Insufficient documentation

## 2015-03-22 DIAGNOSIS — R079 Chest pain, unspecified: Secondary | ICD-10-CM | POA: Diagnosis not present

## 2015-03-22 LAB — CBC
HCT: 37.6 % (ref 36.0–46.0)
HEMOGLOBIN: 12.6 g/dL (ref 12.0–15.0)
MCH: 29.2 pg (ref 26.0–34.0)
MCHC: 33.5 g/dL (ref 30.0–36.0)
MCV: 87.2 fL (ref 78.0–100.0)
Platelets: 199 10*3/uL (ref 150–400)
RBC: 4.31 MIL/uL (ref 3.87–5.11)
RDW: 12.9 % (ref 11.5–15.5)
WBC: 5.3 10*3/uL (ref 4.0–10.5)

## 2015-03-22 LAB — URINE MICROSCOPIC-ADD ON

## 2015-03-22 LAB — URINALYSIS, ROUTINE W REFLEX MICROSCOPIC
Bilirubin Urine: NEGATIVE
Glucose, UA: NEGATIVE mg/dL
HGB URINE DIPSTICK: NEGATIVE
Ketones, ur: NEGATIVE mg/dL
NITRITE: NEGATIVE
PROTEIN: 30 mg/dL — AB
Specific Gravity, Urine: 1.012 (ref 1.005–1.030)
UROBILINOGEN UA: 0.2 mg/dL (ref 0.0–1.0)
pH: 5 (ref 5.0–8.0)

## 2015-03-22 LAB — BASIC METABOLIC PANEL
ANION GAP: 10 (ref 5–15)
BUN: 13 mg/dL (ref 6–23)
CHLORIDE: 100 mmol/L (ref 96–112)
CO2: 33 mmol/L — ABNORMAL HIGH (ref 19–32)
Calcium: 9.9 mg/dL (ref 8.4–10.5)
Creatinine, Ser: 0.89 mg/dL (ref 0.50–1.10)
GFR, EST AFRICAN AMERICAN: 74 mL/min — AB (ref >90)
GFR, EST NON AFRICAN AMERICAN: 64 mL/min — AB (ref >90)
Glucose, Bld: 115 mg/dL — ABNORMAL HIGH (ref 70–99)
POTASSIUM: 3 mmol/L — AB (ref 3.5–5.1)
SODIUM: 143 mmol/L (ref 135–145)

## 2015-03-22 LAB — I-STAT TROPONIN, ED: Troponin i, poc: 0.03 ng/mL (ref 0.00–0.08)

## 2015-03-22 MED ORDER — HYDROCODONE-ACETAMINOPHEN 5-325 MG PO TABS
1.0000 | ORAL_TABLET | Freq: Once | ORAL | Status: AC
  Administered 2015-03-22: 1 via ORAL
  Filled 2015-03-22: qty 1

## 2015-03-22 MED ORDER — AZITHROMYCIN 250 MG PO TABS
500.0000 mg | ORAL_TABLET | Freq: Once | ORAL | Status: AC
  Administered 2015-03-22: 500 mg via ORAL
  Filled 2015-03-22: qty 2

## 2015-03-22 MED ORDER — AZITHROMYCIN 250 MG PO TABS
250.0000 mg | ORAL_TABLET | Freq: Every day | ORAL | Status: DC
Start: 2015-03-22 — End: 2015-05-09

## 2015-03-22 NOTE — ED Provider Notes (Signed)
CSN: [id number]     Arrival date & time 03/22/15  1410 History   First MD Initiated Contact with Patient 03/22/15 1647     Chief Complaint  Patient presents with  . Chest Pain  . Abdominal Pain     (Consider location/radiation/quality/duration/timing/severity/associated sxs/prior Treatment) HPI   This is a 69 year old female, with a history of chronic shoulder pain, diabetes, COPD, presenting today with shoulder pain. This is been going on for 6 months, is located at the left shoulder, is intermittent, throbbing, alleviated with prescribed Norco at home. It radiates throughout the left shoulder, left upper arm, left chest. Negative for dyspnea, nausea, vomiting, diaphoresis, back pain, lower extremity swelling at this time. Positive for chronic cough associated with COPD, productive of clear sputum. Negative for fever, chills. Negative for hemoptysis.  Past Medical History  Diagnosis Date  . Personal history of colonic polyps   . Iron deficiency anemia, unspecified   . Other malaise and fatigue   . Chest pain, unspecified   . Other B-complex deficiencies   . Arthritis     Right Sacroiliac joint  . Other and unspecified hyperlipidemia   . Anxiety and depression   . HTN (hypertension)   . Unspecified hypothyroidism   . Type II or unspecified type diabetes mellitus without mention of complication, not stated as uncontrolled   . Allergic rhinitis, cause unspecified   . Unspecified asthma(493.90)   . Chronic airway obstruction, not elsewhere classified   . Abnormal stress test 08/21/2008    Neg  . GERD (gastroesophageal reflux disease)    Past Surgical History  Procedure Laterality Date  . Foot surgery      hammer toe 2004, 2005  . Abdominal hysterectomy  1982    menometorrhagia  . Carpal tunnel release  03/05/2009    bilateral  . Colonoscopy N/A 09/06/2013    Procedure: COLONOSCOPY;  Surgeon: Louis Meckel, MD;  Location: [facility];  Service: Endoscopy;  Laterality: N/A;   . Hot hemostasis N/A 09/06/2013    Procedure: HOT HEMOSTASIS (ARGON PLASMA COAGULATION/BICAP);  Surgeon: Louis Meckel, MD;  Location: [facility];  Service: Endoscopy;  Laterality: N/A;   Family History  Problem Relation Age of Onset  . Hypertension Mother   . Diabetes Mother   . Heart disease Mother     CHF  . Coronary artery disease Mother   . Lung cancer Father   . Hypertension Father   . Diabetes Father   . Liver cancer Father     w/mets  . Prostate cancer Father   . Breast cancer Sister   . Lung disease Brother     Agent Orange Related  . Asthma Brother   . Allergies Brother   . Colon cancer Neg Hx    History  Substance Use Topics  . Smoking status: Former Smoker    Quit date: 06/01/1997  . Smokeless tobacco: Never Used     Comment: Quit 9 years ago.  . Alcohol Use: No   OB History    No data available     Review of Systems  Constitutional: Negative for fever and chills.  HENT: Negative for facial swelling.   Eyes: Negative for photophobia and pain.  Respiratory: Positive for cough. Negative for shortness of breath.   Cardiovascular: Negative for leg swelling.  Gastrointestinal: Negative for nausea, vomiting and abdominal pain.  Genitourinary: Negative for dysuria.  Musculoskeletal: Positive for arthralgias.  Skin: Negative for rash and wound.  Neurological: Negative for seizures.  Hematological:  Negative for adenopathy.      Allergies  Aspirin; Codeine; Demerol; Gabapentin; Latex; Lipitor; Lisinopril; and Zocor  Home Medications   Prior to Admission medications   Medication Sig Start Date End Date Taking? Authorizing Provider  albuterol (PROVENTIL HFA;VENTOLIN HFA) 108 (90 BASE) MCG/ACT inhaler Inhale 2 puffs into the lungs every 6 (six) hours as needed for wheezing or shortness of breath.    Historical Provider, MD  amoxicillin-clavulanate (AUGMENTIN) 875-125 MG per tablet Take 1 tablet by mouth 2 (two) times daily. 02/04/15   Waymon Budgelinton D Young, MD   azelastine (ASTELIN) 0.1 % nasal spray INSTILL 1 TO 2 SPRAYS INTO EACH NOSTRIL ONCE OR TWICE DAILY AS NEEDED 01/08/15   Waymon Budgelinton D Young, MD  clonazePAM (KLONOPIN) 1 MG tablet TAKE 1 TO 2 TABLETS BY MOUTH AT BEDTIME AS NEEDED FOR ANXIETY 02/27/15   Aleksei Plotnikov V, MD  conjugated estrogens (PREMARIN) vaginal cream Place 1 Applicatorful vaginally 2 (two) times daily as needed (vaginal itching).     Historical Provider, MD  cyanocobalamin (,VITAMIN B-12,) 1000 MCG/ML injection Inject 1 mL (1,000 mcg total) into the muscle every 14 (fourteen) days. 03/13/15   Aleksei Plotnikov V, MD  donepezil (ARICEPT) 10 MG tablet TAKE 1 TABLET BY MOUTH EVERY NIGHT AT BEDTIME 03/16/15   Levert FeinsteinYijun Yan, MD  esomeprazole (NEXIUM) 40 MG capsule Take 40 mg by mouth daily at 12 noon.    Historical Provider, MD  famciclovir (FAMVIR) 500 MG tablet Take 1 tablet (500 mg total) by mouth 3 (three) times daily. 12/18/14   Pecola LawlessWilliam F Hopper, MD  fish oil-omega-3 fatty acids 1000 MG capsule Take 4,000 mg by mouth daily.     Historical Provider, MD  furosemide (LASIX) 40 MG tablet Take 1.5 tablets (60 mg total) by mouth daily. 12/12/14   Aleksei Plotnikov V, MD  HYDROcodone-acetaminophen (NORCO/VICODIN) 5-325 MG per tablet Take 1 tablet by mouth every 6 (six) hours as needed for moderate pain. Please fill on or after 04/16/15 01/29/15   Aleksei Plotnikov V, MD  insulin glargine (LANTUS) 100 UNIT/ML injection Inject 0.4 mLs (40 Units total) into the skin daily. 12/06/14   Aleksei Plotnikov V, MD  INSULIN SYRINGE 1CC/29G 29G X 1/2" 1 ML MISC Use as directed every 14 days for B12 injections. 03/13/15   Aleksei Plotnikov V, MD  ketoconazole (NIZORAL) 2 % cream Apply 1 application topically daily. 12/12/14   Aleksei Plotnikov V, MD  levalbuterol (XOPENEX) 0.63 MG/3ML nebulizer solution Take 1 ampule by nebulization every 6 (six) hours as needed. For wheezing 11/28/12   Waymon Budgelinton D Young, MD  levothyroxine (SYNTHROID, LEVOTHROID) 88 MCG tablet Take 88 mcg  by mouth daily before breakfast.    Historical Provider, MD  metFORMIN (GLUCOPHAGE) 1000 MG tablet TAKE 1 TABLET BY MOUTH TWICE DAILY WITH A MEAL 03/21/15   Aleksei Plotnikov V, MD  Multiple Vitamin (MULTIVITAMIN WITH MINERALS) TABS tablet Take 1 tablet by mouth daily.    Historical Provider, MD  NAMENDA XR 28 MG CP24 24 hr capsule TAKE ONE CAPSULE BY MOUTH EVERY DAY 02/11/15   Enedina FinnerMegan P Millikan, NP  pravastatin (PRAVACHOL) 20 MG tablet Take 1 tablet (20 mg total) by mouth every evening. 11/27/14   Aleksei Plotnikov V, MD  SYMBICORT 80-4.5 MCG/ACT inhaler INHALE 2 PUFFS BY MOUTH TWICE DAILY 12/12/14   Aleksei Plotnikov V, MD  tiotropium (SPIRIVA) 18 MCG inhalation capsule Place 18 mcg into inhaler and inhale daily.    Historical Provider, MD  valsartan (DIOVAN) 160 MG tablet TAKE  1 TABLET BY MOUTH DAILY 03/21/15   Aleksei Plotnikov V, MD  vitamin A 8000 UNIT capsule Take 8,000 Units by mouth daily.    Historical Provider, MD   BP 123/70 mmHg  Pulse 62  Temp(Src) 98.2 F (36.8 C) (Oral)  Resp 18  SpO2 95% Physical Exam  Constitutional: She is oriented to person, place, and time. She appears well-developed and well-nourished. No distress.  HENT:  Head: Normocephalic and atraumatic.  Mouth/Throat: No oropharyngeal exudate.  Eyes: Conjunctivae are normal. Pupils are equal, round, and reactive to light. No scleral icterus.  Neck: Normal range of motion. No tracheal deviation present. No thyromegaly present.  Cardiovascular: Normal rate, regular rhythm and normal heart sounds.  Exam reveals no gallop and no friction rub.   No murmur heard. Pulmonary/Chest: Effort normal and breath sounds normal. No stridor. No respiratory distress. She has no wheezes. She has no rales. She exhibits no tenderness.  Abdominal: Soft. She exhibits no distension and no mass. There is no tenderness. There is no rebound and no guarding.  Musculoskeletal: Normal range of motion. She exhibits no edema.       Left shoulder:  She exhibits tenderness and pain. She exhibits normal range of motion, no swelling, no effusion, no crepitus, no deformity, no laceration, no spasm, normal pulse and normal strength.  Neurological: She is alert and oriented to person, place, and time.  Skin: Skin is warm and dry. She is not diaphoretic.    ED Course  Procedures (including critical care time) Labs Review Labs Reviewed  BASIC METABOLIC PANEL - Abnormal; Notable for the following:    Potassium 3.0 (*)    CO2 33 (*)    Glucose, Bld 115 (*)    GFR calc non Af Amer 64 (*)    GFR calc Af Amer 74 (*)    All other components within normal limits  URINALYSIS, ROUTINE W REFLEX MICROSCOPIC - Abnormal; Notable for the following:    APPearance HAZY (*)    Protein, ur 30 (*)    Leukocytes, UA SMALL (*)    All other components within normal limits  URINE MICROSCOPIC-ADD ON - Abnormal; Notable for the following:    Casts HYALINE CASTS (*)    Crystals CA OXALATE CRYSTALS (*)    All other components within normal limits  CBC  I-STAT TROPOININ, ED    Imaging Review Dg Chest 2 View  03/22/2015   CLINICAL DATA:  Chest pain and shortness of breath since yesterday. COPD with chronic coughing congestion.  EXAM: CHEST  2 VIEW  COMPARISON:  12/09/2014  FINDINGS: Heart size at upper limits of normal. Diffusely prominent interstitial reticular opacities are noted. No pleural effusion. Mild right glenohumeral degenerative change identified. Mild reticulonodular prominence of the interstitial markings is noted over the mid lung zones bilaterally, new since the prior exam.  IMPRESSION: New reticulonodular opacities over the bilateral mid lung zones superimposed on chronically prominent interstitial markings. This is nonspecific but could represent early bronchopneumonia. Followup chest radiographs are recommended in 3-4 weeks to document resolution, presumably after treatment.   Electronically Signed   By: Christiana Pellant M.D.   On: 03/22/2015 15:19    Dg Shoulder Left  03/22/2015   CLINICAL DATA:  Chronic left shoulder pain with acute worsening approximately 7 days ago, without injury.  EXAM: LEFT SHOULDER - 2+ VIEW  COMPARISON:  09/05/2014.  FINDINGS: No evidence acute fracture or glenohumeral dislocation. Subacromial space well preserved. Acromioclavicular joint intact with moderate degenerative changes, unchanged.  IMPRESSION: No acute osseous abnormality. Stable moderate degenerative changes involving the AC joint.   Electronically Signed   By: Hulan Saas M.D.   On: 03/22/2015 19:00     EKG Interpretation   Date/Time:  Friday March 22 2015 14:16:57 EDT Ventricular Rate:  67 PR Interval:  154 QRS Duration: 126 QT Interval:  464 QTC Calculation: 490 R Axis:   -60 Text Interpretation:  Sinus rhythm with Fusion complexes with fusion or  intermittent ventricular pre-excitation (WPW) Right bundle branch block  Left anterior fascicular block  Bifascicular block  Moderate voltage  criteria for LVH, may be normal variant Abnormal ECG Sinus rhythm w either  artefact or PAC T wave abnormality Non-specific intra-ventricular  conduction delay Abnormal ekg Confirmed by Gerhard Munch  MD [phone]) on  03/22/2015 4:53:49 PM      MDM   Final diagnoses:  Chronic shoulder pain, left  CAP (community acquired pneumonia)    This is a 69 year old female, with a history of chronic shoulder pain, diabetes, COPD, presenting today with shoulder pain. This is been going on for 6 months, is located at the left shoulder, is intermittent, throbbing, alleviated with prescribed Norco at home. It radiates throughout the left shoulder, left upper arm, left chest. Negative for dyspnea, nausea, vomiting, diaphoresis, back pain, lower extremity swelling at this time. Positive for chronic cough associated with COPD, productive of clear sputum. Negative for fever, chills. Negative for hemoptysis.  The patient states that she's been suffering from this ever  since she received a influenza vaccination which was injected close to the left shoulder joint.  EKG is without acute ischemic change. Troponin is undetectable. Chest x-ray is within normal limits, with possible infiltrates bilaterally. Remainder of laboratory workup is within normal limits. At this time, I do not suspect ACS, pulmonary embolism, pneumothorax, aortic dissection. At this time, will treat with antibiotics for potential pneumonia, home dose of home pain medication. We'll order x-ray of the left shoulder to rule out fracture, dislocation, other emergent etiology of pain. This pain is well-controlled, there are no concerning findings on x-ray, we'll discharge the patient with close follow-up.  Negative for acute abdomen of the left shoulder. Pt stable for discharge, FU.  All questions answered.  Return precautions given.  I have discussed case and care has been guided by my attending physician, Dr. Jeraldine Loots.  Loma Boston, MD 03/23/15 [id number]  Gerhard Munch, MD 03/24/15 [phone]

## 2015-03-22 NOTE — ED Notes (Addendum)
Pt. Reports left sided chest pain worse with deep breath this AM. Denies SOB, N/V, diaphoresis. Reports left arm pain x7 days. Also reports increased urination x3 days with lower abdominal pain.

## 2015-03-22 NOTE — Discharge Instructions (Signed)
It is important that you take your home pain medication when you are in pain.  Pneumonia Pneumonia is an infection of the lungs.  CAUSES Pneumonia may be caused by bacteria or a virus. Usually, these infections are caused by breathing infectious particles into the lungs (respiratory tract). SIGNS AND SYMPTOMS   Cough.  Fever.  Chest pain.  Increased rate of breathing.  Wheezing.  Mucus production. DIAGNOSIS  If you have the common symptoms of pneumonia, your health care provider will typically confirm the diagnosis with a chest X-ray. The X-ray will show an abnormality in the lung (pulmonary infiltrate) if you have pneumonia. Other tests of your blood, urine, or sputum may be done to find the specific cause of your pneumonia. Your health care provider may also do tests (blood gases or pulse oximetry) to see how well your lungs are working. TREATMENT  Some forms of pneumonia may be spread to other people when you cough or sneeze. You may be asked to wear a mask before and during your exam. Pneumonia that is caused by bacteria is treated with antibiotic medicine. Pneumonia that is caused by the influenza virus may be treated with an antiviral medicine. Most other viral infections must run their course. These infections will not respond to antibiotics.  HOME CARE INSTRUCTIONS   Cough suppressants may be used if you are losing too much rest. However, coughing protects you by clearing your lungs. You should avoid using cough suppressants if you can.  Your health care provider may have prescribed medicine if he or she thinks your pneumonia is caused by bacteria or influenza. Finish your medicine even if you start to feel better.  Your health care provider may also prescribe an expectorant. This loosens the mucus to be coughed up.  Take medicines only as directed by your health care provider.  Do not smoke. Smoking is a common cause of bronchitis and can contribute to pneumonia. If you are  a smoker and continue to smoke, your cough may last several weeks after your pneumonia has cleared.  A cold steam vaporizer or humidifier in your room or home may help loosen mucus.  Coughing is often worse at night. Sleeping in a semi-upright position in a recliner or using a couple pillows under your head will help with this.  Get rest as you feel it is needed. Your body will usually let you know when you need to rest. PREVENTION A pneumococcal shot (vaccine) is available to prevent a common bacterial cause of pneumonia. This is usually suggested for:  People over 71 years old.  Patients on chemotherapy.  People with chronic lung problems, such as bronchitis or emphysema.  People with immune system problems. If you are over 65 or have a high risk condition, you may receive the pneumococcal vaccine if you have not received it before. In some countries, a routine influenza vaccine is also recommended. This vaccine can help prevent some cases of pneumonia.You may be offered the influenza vaccine as part of your care. If you smoke, it is time to quit. You may receive instructions on how to stop smoking. Your health care provider can provide medicines and counseling to help you quit. SEEK MEDICAL CARE IF: You have a fever. SEEK IMMEDIATE MEDICAL CARE IF:   Your illness becomes worse. This is especially true if you are elderly or weakened from any other disease.  You cannot control your cough with suppressants and are losing sleep.  You begin coughing up blood.  You  develop pain which is getting worse or is uncontrolled with medicines.  Any of the symptoms which initially brought you in for treatment are getting worse rather than better.  You develop shortness of breath or chest pain. MAKE SURE YOU:   Understand these instructions.  Will watch your condition.  Will get help right away if you are not doing well or get worse. Document Released: 11/16/2005 Document Revised:  04/02/2014 Document Reviewed: 02/05/2011 [facility] Patient Information 2015 [place], [place]. This information is not intended to replace advice given to you by your health care provider. Make sure you discuss any questions you have with your health care provider. Shoulder Pain The shoulder is the joint that connects your arms to your body. The bones that form the shoulder joint include the upper arm bone (humerus), the shoulder blade (scapula), and the collarbone (clavicle). The top of the humerus is shaped like a ball and fits into a rather flat socket on the scapula (glenoid cavity). A combination of muscles and strong, fibrous tissues that connect muscles to bones (tendons) support your shoulder joint and hold the ball in the socket. Small, fluid-filled sacs (bursae) are located in different areas of the joint. They act as cushions between the bones and the overlying soft tissues and help reduce friction between the gliding tendons and the bone as you move your arm. Your shoulder joint allows a wide range of motion in your arm. This range of motion allows you to do things like scratch your back or throw a ball. However, this range of motion also makes your shoulder more prone to pain from overuse and injury. Causes of shoulder pain can originate from both injury and overuse and usually can be grouped in the following four categories:  Redness, swelling, and pain (inflammation) of the tendon (tendinitis) or the bursae (bursitis).  Instability, such as a dislocation of the joint.  Inflammation of the joint (arthritis).  Broken bone (fracture). HOME CARE INSTRUCTIONS   Apply ice to the sore area.  Put ice in a plastic bag.  Place a towel between your skin and the bag.  Leave the ice on for 15-20 minutes, 3-4 times per day for the first 2 days, or as directed by your health care provider.  Stop using cold packs if they do not help with the pain.  If you have a shoulder sling or immobilizer,  wear it as long as your caregiver instructs. Only remove it to shower or bathe. Move your arm as little as possible, but keep your hand moving to prevent swelling.  Squeeze a soft ball or foam pad as much as possible to help prevent swelling.  Only take over-the-counter or prescription medicines for pain, discomfort, or fever as directed by your caregiver. SEEK MEDICAL CARE IF:   Your shoulder pain increases, or new pain develops in your arm, hand, or fingers.  Your hand or fingers become cold and numb.  Your pain is not relieved with medicines. SEEK IMMEDIATE MEDICAL CARE IF:   Your arm, hand, or fingers are numb or tingling.  Your arm, hand, or fingers are significantly swollen or turn white or blue. MAKE SURE YOU:   Understand these instructions.  Will watch your condition.  Will get help right away if you are not doing well or get worse. Document Released: 08/26/2005 Document Revised: 04/02/2014 Document Reviewed: 10/31/2011 [facility] Patient Information 2015 [place], [place]. This information is not intended to replace advice given to you by your health care provider. Make sure  you discuss any questions you have with your health care provider.

## 2015-03-22 NOTE — ED Notes (Signed)
Resident MD at bedside.

## 2015-03-25 ENCOUNTER — Ambulatory Visit: Payer: Self-pay | Admitting: Internal Medicine

## 2015-03-26 ENCOUNTER — Encounter: Payer: Self-pay | Admitting: Internal Medicine

## 2015-04-08 ENCOUNTER — Encounter: Payer: Self-pay | Admitting: Internal Medicine

## 2015-04-17 ENCOUNTER — Other Ambulatory Visit: Payer: Self-pay | Admitting: Internal Medicine

## 2015-04-18 ENCOUNTER — Other Ambulatory Visit: Payer: Self-pay | Admitting: Internal Medicine

## 2015-04-18 ENCOUNTER — Other Ambulatory Visit: Payer: Self-pay | Admitting: Pulmonary Disease

## 2015-04-20 ENCOUNTER — Other Ambulatory Visit: Payer: Self-pay | Admitting: Internal Medicine

## 2015-05-01 ENCOUNTER — Telehealth: Payer: Self-pay | Admitting: Gastroenterology

## 2015-05-01 ENCOUNTER — Telehealth: Payer: Self-pay | Admitting: Internal Medicine

## 2015-05-01 MED ORDER — AMOXICILLIN-POT CLAVULANATE 875-125 MG PO TABS: 1.0000 | ORAL_TABLET | Freq: Two times a day (BID) | ORAL | Status: DC

## 2015-05-01 NOTE — Telephone Encounter (Signed)
Called, spoke with [name].  Discussed below recs per CY.  She verbalized understanding and will inform pt.  [name] aware to have pt call back if symptoms do not improve or worsen.

## 2015-05-01 NOTE — Telephone Encounter (Signed)
Spoke with [name]. Reports that the pt is having issues with a cough. Cough is producing clear mucus. Sinus headache is also present. Denies SOB or chest tightness. Would like Augmentin called in.  Allergies  Allergen Reactions  . Aspirin Itching  . Codeine Itching  . Demerol Itching    Unknown-unconscious.  . Gabapentin     Rxed in ER; not tolerated  . Latex Itching  . Lipitor [Atorvastatin]     Urticaria   . Lisinopril Itching and Cough    REACTION: causes cough  . Zocor [Simvastatin]     Urticaria     Current Outpatient Prescriptions on File Prior to Visit  Medication Sig Dispense Refill  . albuterol (PROVENTIL HFA;VENTOLIN HFA) 108 (90 BASE) MCG/ACT inhaler Inhale 2 puffs into the lungs every 6 (six) hours as needed for wheezing or shortness of breath.    Marland Kitchen. amoxicillin-clavulanate (AUGMENTIN) 875-125 MG per tablet Take 1 tablet by mouth 2 (two) times daily. 20 tablet 0  . azelastine (ASTELIN) 0.1 % nasal spray INSERT 1-2 SPRAYS INTO EACH NOSTRIL ONCE OR TWICE DAILY AS NEEDED 30 mL 0  . azithromycin (ZITHROMAX) 250 MG tablet Take 1 tablet (250 mg total) by mouth daily. Starting tomorrow, ake 1 every day until finished. 4 tablet 0  . clonazePAM (KLONOPIN) 1 MG tablet TAKE 1 TO 2 TABLETS BY MOUTH AT BEDTIME AS NEEDED FOR ANXIETY 60 tablet 2  . conjugated estrogens (PREMARIN) vaginal cream Place 1 Applicatorful vaginally 2 (two) times daily as needed (vaginal itching).     . cyanocobalamin (,VITAMIN B-12,) 1000 MCG/ML injection Inject 1 mL (1,000 mcg total) into the muscle every 14 (fourteen) days. 10 mL 11  . donepezil (ARICEPT) 10 MG tablet TAKE 1 TABLET BY MOUTH EVERY NIGHT AT BEDTIME 90 tablet 1  . esomeprazole (NEXIUM) 40 MG capsule Take 40 mg by mouth daily at 12 noon.    . famciclovir (FAMVIR) 500 MG tablet Take 1 tablet (500 mg total) by mouth 3 (three) times daily. 21 tablet 0  . fish oil-omega-3 fatty acids 1000 MG capsule Take 4,000 mg by mouth daily.     .  furosemide (LASIX) 40 MG tablet Take 1.5 tablets (60 mg total) by mouth daily. 45 tablet 11  . HYDROcodone-acetaminophen (NORCO/VICODIN) 5-325 MG per tablet Take 1 tablet by mouth every 6 (six) hours as needed for moderate pain. Please fill on or after 04/16/15 120 tablet 0  . insulin glargine (LANTUS) 100 UNIT/ML injection Inject 0.4 mLs (40 Units total) into the skin daily. 10 mL 11  . INSULIN SYRINGE 1CC/29G 29G X 1/2" 1 ML MISC Use as directed every 14 days for B12 injections. 100 each 3  . ketoconazole (NIZORAL) 2 % cream Apply 1 application topically daily. 45 g 1  . levalbuterol (XOPENEX) 0.63 MG/3ML nebulizer solution Take 1 ampule by nebulization every 6 (six) hours as needed. For wheezing    . levothyroxine (SYNTHROID, LEVOTHROID) 88 MCG tablet Take 88 mcg by mouth daily before breakfast.    . levothyroxine (SYNTHROID, LEVOTHROID) 88 MCG tablet TAKE 1 TABLET BY MOUTH EVERY DAY 90 tablet 3  . metFORMIN (GLUCOPHAGE) 1000 MG tablet TAKE 1 TABLET BY MOUTH TWICE DAILY WITH A MEAL 60 tablet 11  . Multiple Vitamin (MULTIVITAMIN WITH MINERALS) TABS tablet Take 1 tablet by mouth daily.    Marland Kitchen. NAMENDA XR 28 MG CP24 24 hr capsule TAKE ONE CAPSULE BY MOUTH EVERY DAY 90 capsule 1  . pravastatin (PRAVACHOL) 20 MG tablet Take 1  tablet (20 mg total) by mouth every evening. 30 tablet 5  . SYMBICORT 80-4.5 MCG/ACT inhaler INHALE 2 PUFFS BY MOUTH TWICE DAILY 10.2 g 0  . tiotropium (SPIRIVA) 18 MCG inhalation capsule Place 18 mcg into inhaler and inhale daily.    . valsartan (DIOVAN) 160 MG tablet TAKE 1 TABLET BY MOUTH DAILY 30 tablet 11  . vitamin A 8000 UNIT capsule Take 8,000 Units by mouth daily.    . [DISCONTINUED] Calcium Carbonate-Vitamin D (CALCIUM 600/VITAMIN D) 600-400 MG-UNIT per tablet Take 1 tablet by mouth 2 (two) times daily.      . [DISCONTINUED] sodium chloride (OCEAN) 0.65 % nasal spray Place 2 sprays into the nose as needed. TO OPEN NASAL BREATHING     No current facility-administered  medications on file prior to visit.    CY - please advise. Thanks.

## 2015-05-01 NOTE — Telephone Encounter (Signed)
Patient has dementia. Spoke with [name]. The patient is complaining of diarrhea. She was seen for this last year and put on Questran daily with improvement. Martie LeeSabrina did not know if she still has this medication and did not recognize the name. Appointment scheduled to re-evaluate.

## 2015-05-01 NOTE — Telephone Encounter (Signed)
Ok augmentin 875, # 14, 1 twice daily

## 2015-05-09 ENCOUNTER — Encounter: Payer: Self-pay | Admitting: Physician Assistant

## 2015-05-09 ENCOUNTER — Other Ambulatory Visit: Payer: Self-pay | Admitting: Physician Assistant

## 2015-05-09 ENCOUNTER — Ambulatory Visit (INDEPENDENT_AMBULATORY_CARE_PROVIDER_SITE_OTHER): Payer: Medicare Other | Admitting: Physician Assistant

## 2015-05-09 ENCOUNTER — Other Ambulatory Visit (INDEPENDENT_AMBULATORY_CARE_PROVIDER_SITE_OTHER): Payer: PRIVATE HEALTH INSURANCE

## 2015-05-09 VITALS — BP 100/58 | HR 72 | Ht 63.0 in | Wt 183.0 lb

## 2015-05-09 DIAGNOSIS — R197 Diarrhea, unspecified: Secondary | ICD-10-CM

## 2015-05-09 LAB — IGA: IgA: 183 mg/dL (ref 68–378)

## 2015-05-09 MED ORDER — COLESTIPOL HCL 1 G PO TABS: 1.0000 g | ORAL_TABLET | Freq: Two times a day (BID) | ORAL | Status: DC

## 2015-05-09 MED ORDER — SACCHAROMYCES BOULARDII 250 MG PO CAPS: 250.0000 mg | ORAL_CAPSULE | Freq: Two times a day (BID) | ORAL | Status: DC

## 2015-05-09 NOTE — Patient Instructions (Signed)
Please go to the basement level to have your labs drawn and stool study. We sent prescriptions to [facility] [address] 1. Colestid tablets 2. FLorastor probiotic  We made you a follow up appointment with Dr. Arlyce Dice for 07-11-2015 at 1:30 PM.

## 2015-05-10 ENCOUNTER — Encounter: Payer: Self-pay | Admitting: Physician Assistant

## 2015-05-10 LAB — TISSUE TRANSGLUTAMINASE, IGG: Tissue Transglut Ab: 1 U/mL (ref <6)

## 2015-05-10 NOTE — Progress Notes (Signed)
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 She is known to Dr. Arlyce Dice with a long-standing history of diarrhea. She was last evaluated in January  Of 2015. She had been taken ampicillin daily rather than 1 week out of the month but felt stool frequency was somewhat decreased while on the [employer]. She has a history of gastroparesis , and her etiology for diarrhea has not been determined. Her last colonoscopy was on 09/06/2013 at which time she was noted to have mild diverticulosis in the sigmoid but the colon mucosa was otherwise normal. Biopsies were negative for microscopic colitis. At her last visit she was prescribed cholestyramine. She used it for several days and doesn't remember if it helped stopped it. She has diarrhea almost on a daily basis but reports occasional build days of constipation. She had recently been on hydrocodone but had to stop it due to constipation. She states once she stopped the hydrocodone her diarrhea came back. At this time she is presently on Augmentin for 4 days and has noted no exacerbation of her diarrhea. She reports that she gets lower abdominal cramping prior to defecation relieved with defecation. She has had no bright red blood per rectum or melena.   Past Medical History  Diagnosis Date  . Personal history of colonic polyps   . Iron deficiency anemia, unspecified   . Other malaise and fatigue   . Chest pain, unspecified   . Other B-complex deficiencies   . Arthritis     Right Sacroiliac joint  . Other and unspecified hyperlipidemia   . Anxiety and depression   . HTN (hypertension)   . Unspecified hypothyroidism   . Type II or unspecified type diabetes mellitus without mention of complication, not stated as uncontrolled   . Allergic rhinitis, cause unspecified   . Unspecified asthma(493.90)   . Chronic  airway obstruction, not elsewhere classified   . Abnormal stress test 08/21/2008    Neg  . GERD (gastroesophageal reflux disease)     Past Surgical History  Procedure Laterality Date  . Foot surgery      hammer toe 2004, 2005  . Abdominal hysterectomy  1982    menometorrhagia  . Carpal tunnel release  03/05/2009    bilateral  . Colonoscopy N/A 09/06/2013    Procedure: COLONOSCOPY;  Surgeon: Louis Meckel, MD;  Location: [facility];  Service: Endoscopy;  Laterality: N/A;  . Hot hemostasis N/A 09/06/2013    Procedure: HOT HEMOSTASIS (ARGON PLASMA COAGULATION/BICAP);  Surgeon: Louis Meckel, MD;  Location: [facility];  Service: Endoscopy;  Laterality: N/A;   Family History  Problem Relation Age of Onset  . Hypertension Mother   . Diabetes Mother   . Heart disease Mother     CHF  . Coronary artery disease Mother   . Lung cancer Father   . Hypertension Father   . Diabetes Father   . Liver cancer Father     w/mets  . Prostate cancer Father   . Breast cancer Sister   . Lung disease Brother     Agent Orange Related  . Asthma Brother   . Allergies Brother   . Colon cancer Neg Hx    History  Substance Use Topics  . Smoking status: Former Smoker    Quit date: 06/01/1997  . Smokeless tobacco: Never Used     Comment: Quit 9  years ago.  . Alcohol Use: No   Current Outpatient Prescriptions  Medication Sig Dispense Refill  . albuterol (PROVENTIL HFA;VENTOLIN HFA) 108 (90 BASE) MCG/ACT inhaler Inhale 2 puffs into the lungs every 6 (six) hours as needed for wheezing or shortness of breath.    Marland Kitchen amoxicillin-clavulanate (AUGMENTIN) 875-125 MG per tablet Take 1 tablet by mouth 2 (two) times daily. 14 tablet 0  . azelastine (ASTELIN) 0.1 % nasal spray INSERT 1-2 SPRAYS INTO EACH NOSTRIL ONCE OR TWICE DAILY AS NEEDED 30 mL 0  . clonazePAM (KLONOPIN) 1 MG tablet TAKE 1 TO 2 TABLETS BY MOUTH AT BEDTIME AS NEEDED FOR ANXIETY 60 tablet 2  . conjugated estrogens (PREMARIN) vaginal  cream Place 1 Applicatorful vaginally 2 (two) times daily as needed (vaginal itching).     . cyanocobalamin (,VITAMIN B-12,) 1000 MCG/ML injection Inject 1 mL (1,000 mcg total) into the muscle every 14 (fourteen) days. 10 mL 11  . donepezil (ARICEPT) 10 MG tablet TAKE 1 TABLET BY MOUTH EVERY NIGHT AT BEDTIME 90 tablet 1  . esomeprazole (NEXIUM) 40 MG capsule Take 40 mg by mouth daily at 12 noon.    . fish oil-omega-3 fatty acids 1000 MG capsule Take 4,000 mg by mouth daily.     . furosemide (LASIX) 40 MG tablet Take 1.5 tablets (60 mg total) by mouth daily. 45 tablet 11  . HYDROcodone-acetaminophen (NORCO/VICODIN) 5-325 MG per tablet Take 1 tablet by mouth every 6 (six) hours as needed for moderate pain. Please fill on or after 04/16/15 120 tablet 0  . insulin glargine (LANTUS) 100 UNIT/ML injection Inject 0.4 mLs (40 Units total) into the skin daily. 10 mL 11  . INSULIN SYRINGE 1CC/29G 29G X 1/2" 1 ML MISC Use as directed every 14 days for B12 injections. 100 each 3  . ketoconazole (NIZORAL) 2 % cream Apply 1 application topically daily. 45 g 1  . levalbuterol (XOPENEX) 0.63 MG/3ML nebulizer solution Take 1 ampule by nebulization every 6 (six) hours as needed. For wheezing    . levothyroxine (SYNTHROID, LEVOTHROID) 88 MCG tablet TAKE 1 TABLET BY MOUTH EVERY DAY 90 tablet 3  . metFORMIN (GLUCOPHAGE) 1000 MG tablet TAKE 1 TABLET BY MOUTH TWICE DAILY WITH A MEAL 60 tablet 11  . Multiple Vitamin (MULTIVITAMIN WITH MINERALS) TABS tablet Take 1 tablet by mouth daily.    Marland Kitchen NAMENDA XR 28 MG CP24 24 hr capsule TAKE ONE CAPSULE BY MOUTH EVERY DAY 90 capsule 1  . pravastatin (PRAVACHOL) 20 MG tablet Take 1 tablet (20 mg total) by mouth every evening. 30 tablet 5  . SYMBICORT 80-4.5 MCG/ACT inhaler INHALE 2 PUFFS BY MOUTH TWICE DAILY 10.2 g 0  . tiotropium (SPIRIVA) 18 MCG inhalation capsule Place 18 mcg into inhaler and inhale daily.    . valsartan (DIOVAN) 160 MG tablet TAKE 1 TABLET BY MOUTH DAILY 30 tablet  11  . vitamin A 8000 UNIT capsule Take 8,000 Units by mouth daily.    Marland Kitchen MICRONIZED COLESTIPOL HCL 1 G tablet TAKE 1 TABLET(1 GRAM) BY MOUTH TWICE DAILY 180 tablet 0  . saccharomyces boulardii (FLORASTOR) 250 MG capsule Take 1 capsule (250 mg total) by mouth 2 (two) times daily. 60 capsule 0  . [DISCONTINUED] Calcium Carbonate-Vitamin D (CALCIUM 600/VITAMIN D) 600-400 MG-UNIT per tablet Take 1 tablet by mouth 2 (two) times daily.      . [DISCONTINUED] sodium chloride (OCEAN) 0.65 % nasal spray Place 2 sprays into the nose as needed. TO OPEN NASAL BREATHING  No current facility-administered medications for this visit.   Allergies  Allergen Reactions  . Aspirin Itching  . Codeine Itching  . Demerol Itching    Unknown-unconscious.  . Gabapentin     Rxed in ER; not tolerated  . Latex Itching  . Lipitor [Atorvastatin]     Urticaria   . Lisinopril Itching and Cough    REACTION: causes cough  . Zocor [Simvastatin]     Urticaria       Review of Systems:  per history of present illness otherwise negative  LAB RESULTS:  CBC 03/22/2015 white blood count 5.3, hemoglobin 12.6, hematocrit 37.6, platelets 199,000.   Physical Exam: General: Pleasant,  Chronically ill-appearingfemale in no acute distress Head: Normocephalic and atraumatic Eyes:  sclerae anicteric, conjunctiva pink  Ears: Normal auditory acuity Lungs: Clear throughout to auscultation Heart: Regular rate and rhythm Abdomen: Soft, non distended, non-tender. No masses, no hepatomegaly. Normal bowel sounds Musculoskeletal: Symmetrical with no gross deformities  Extremities: No edema  Neurological: Alert oriented x 4, grossly nonfocal Psychological:  Alert and cooperative. Normal mood and affect  Assessment and Recommendations:   69 year old female with chronic diarrhea of undetermined  Etiology here for follow-up. I wonder if perhaps her Aricept may be contributing to her diarrhea. Will obtain stool for C. Difficile  along with pancreatic fecal elastase, IgA, and TTG. If elastase is low, we'll give a trial of Creon. If C. Difficile positive will treat with a course of Flagyl. We'll empirically start florastor  250 mg 1 by mouth twice daily for 30 days. Will give trial of Colestid 1 g twice a day as well. Patient will return for follow-up in 2 months, sooner if needed.       Benjaman Artman, Tollie Pizza PA-C 05/10/2015,

## 2015-05-13 NOTE — Progress Notes (Signed)
Reviewed and agree with management. Raguel Kosloski D. Anhthu Perdew, M.D., FACG  

## 2015-05-18 ENCOUNTER — Other Ambulatory Visit: Payer: Self-pay | Admitting: Internal Medicine

## 2015-05-21 ENCOUNTER — Telehealth: Payer: Self-pay | Admitting: Internal Medicine

## 2015-05-21 NOTE — Telephone Encounter (Signed)
Patient requesting refill for HYDROcodone-acetaminophen (NORCO/VICODIN) 5-325 MG per tablet [[id number]]. Please call Martie Lee at [phone]

## 2015-05-22 ENCOUNTER — Other Ambulatory Visit: Payer: Self-pay | Admitting: Internal Medicine

## 2015-05-22 MED ORDER — HYDROCODONE-ACETAMINOPHEN 5-325 MG PO TABS: 1.0000 | ORAL_TABLET | Freq: Four times a day (QID) | ORAL | Status: DC | PRN

## 2015-05-22 NOTE — Telephone Encounter (Signed)
Rx printed/upfront for p/u. Martie Lee informed of below.

## 2015-05-22 NOTE — Telephone Encounter (Signed)
OK to fill this prescription with additional refills x0 OV q 3 mo Thank you!  

## 2015-05-26 ENCOUNTER — Other Ambulatory Visit: Payer: Self-pay | Admitting: Internal Medicine

## 2015-05-27 ENCOUNTER — Telehealth: Payer: Self-pay | Admitting: Internal Medicine

## 2015-05-27 NOTE — Telephone Encounter (Signed)
Pls call in clonazepam. OV for leg swelling pls Thx

## 2015-05-27 NOTE — Telephone Encounter (Signed)
Patient's daughter in law is calling about left foot and ankle has been swelling and using her fluid pills and they tried to get her to elevate it and she won't do it. She is wondering if there is anything else that can be done.  She is also calling about the refill request for clonazePAM (KLONOPIN) 1 MG tablet [[id number]. Patient is out of her medicine and daughter in law has to work tonight and is hoping to get it called in asap

## 2015-05-28 MED ORDER — CLONAZEPAM 1 MG PO TABS: 1.0000 mg | ORAL_TABLET | Freq: Every evening | ORAL | Status: DC | PRN

## 2015-05-28 NOTE — Telephone Encounter (Signed)
Rf phoned in. See meds. [name] informed.

## 2015-05-28 NOTE — Telephone Encounter (Signed)
Spoke to [name]. Scheduled ov for tomorrow for legs. She requests that we give her a call to advise clonazepam has been called in.

## 2015-05-29 ENCOUNTER — Ambulatory Visit (INDEPENDENT_AMBULATORY_CARE_PROVIDER_SITE_OTHER): Payer: Medicare Other | Admitting: Internal Medicine

## 2015-05-29 ENCOUNTER — Encounter: Payer: Self-pay | Admitting: Internal Medicine

## 2015-05-29 VITALS — BP 118/76 | HR 61 | Wt 186.0 lb

## 2015-05-29 DIAGNOSIS — E1129 Type 2 diabetes mellitus with other diabetic kidney complication: Secondary | ICD-10-CM | POA: Diagnosis not present

## 2015-05-29 DIAGNOSIS — E034 Atrophy of thyroid (acquired): Secondary | ICD-10-CM

## 2015-05-29 DIAGNOSIS — E1165 Type 2 diabetes mellitus with hyperglycemia: Secondary | ICD-10-CM

## 2015-05-29 DIAGNOSIS — E038 Other specified hypothyroidism: Secondary | ICD-10-CM

## 2015-05-29 DIAGNOSIS — G300 Alzheimer's disease with early onset: Secondary | ICD-10-CM | POA: Diagnosis not present

## 2015-05-29 DIAGNOSIS — R609 Edema, unspecified: Secondary | ICD-10-CM

## 2015-05-29 DIAGNOSIS — F22 Delusional disorders: Secondary | ICD-10-CM

## 2015-05-29 DIAGNOSIS — F0280 Dementia in other diseases classified elsewhere without behavioral disturbance: Secondary | ICD-10-CM

## 2015-05-29 DIAGNOSIS — IMO0002 Reserved for concepts with insufficient information to code with codable children: Secondary | ICD-10-CM

## 2015-05-29 DIAGNOSIS — E538 Deficiency of other specified B group vitamins: Secondary | ICD-10-CM

## 2015-05-29 DIAGNOSIS — M7989 Other specified soft tissue disorders: Secondary | ICD-10-CM

## 2015-05-29 DIAGNOSIS — F0282 Dementia in other diseases classified elsewhere, unspecified severity, with psychotic disturbance: Secondary | ICD-10-CM

## 2015-05-29 MED ORDER — FUROSEMIDE 80 MG PO TABS: 80.0000 mg | ORAL_TABLET | Freq: Every day | ORAL | Status: DC

## 2015-05-29 MED ORDER — HYDROCODONE-ACETAMINOPHEN 5-325 MG PO TABS: 1.0000 | ORAL_TABLET | Freq: Four times a day (QID) | ORAL | Status: DC | PRN

## 2015-05-29 NOTE — Progress Notes (Signed)
   Subjective:    HPI   C/o left foot and ankle >R have been swelling and using her fluid pills (now on 2 40 mg Lasix/d) and they tried to get her to elevate it and she won't do it. She is wondering if there is anything else that can be done.  F/u B LE swelling x months, no DVT on US.   The patient presents for a follow-up of  chronic hypertension, chronic dyslipidemia, type 2 diabetes controlled with medicines, B12 def     Wt Readings from Last 3 Encounters:  05/29/15 186 lb (84.369 kg)  05/09/15 183 lb (83.008 kg)  03/13/15 187 lb (84.823 kg)   BP Readings from Last 3 Encounters:  05/29/15 118/76  05/09/15 100/58  03/22/15 145/63      Review of Systems  Constitutional: Positive for unexpected weight change. Negative for chills, activity change, appetite change and fatigue.  HENT: Negative for congestion, mouth sores and sinus pressure.   Eyes: Negative for visual disturbance.  Respiratory: Negative for cough and chest tightness.   Gastrointestinal: Negative for nausea, vomiting and abdominal pain.  Genitourinary: Negative for urgency, frequency, hematuria, flank pain, difficulty urinating and vaginal pain.  Musculoskeletal: Negative for back pain and gait problem.  Skin: Negative for pallor, rash and wound.  Neurological: Positive for tremors. Negative for dizziness, weakness, numbness and headaches.  Psychiatric/Behavioral: Positive for sleep disturbance. Negative for suicidal ideas and confusion. The patient is nervous/anxious.        Objective:   Physical Exam  Constitutional: She appears well-developed. No distress.  HENT:  Head: Normocephalic.  Right Ear: External ear normal.  Left Ear: External ear normal.  Nose: Nose normal.  Mouth/Throat: Oropharynx is clear and moist.  Eyes: Conjunctivae are normal. Pupils are equal, round, and reactive to light. Right eye exhibits no discharge. Left eye exhibits no discharge.  Neck: Normal range of motion. Neck supple.  No JVD present. No tracheal deviation present. No thyromegaly present.  Cardiovascular: Normal rate, regular rhythm and normal heart sounds.   Pulmonary/Chest: No stridor. No respiratory distress. She has no wheezes.  Abdominal: Soft. Bowel sounds are normal. She exhibits no distension and no mass. There is no tenderness. There is no rebound and no guarding.  Musculoskeletal: She exhibits no edema or tenderness.  Lymphadenopathy:    She has no cervical adenopathy.  Neurological: She displays normal reflexes. No cranial nerve deficit. She exhibits normal muscle tone. Coordination abnormal.  head tremor ++ Hands tremor +   Skin: No rash noted. No erythema.  Psychiatric: She has a normal mood and affect. Her behavior is normal. Judgment and thought content normal.  No to trace edema B dorsal feet   Lab Results  Component Value Date   WBC 5.3 03/22/2015   HGB 12.6 03/22/2015   HCT 37.6 03/22/2015   PLT 199 03/22/2015   GLUCOSE 115* 03/22/2015   CHOL 141 11/12/2014   TRIG 129.0 11/12/2014   HDL 44.30 11/12/2014   LDLDIRECT 130.4 11/02/2013   LDLCALC 71 11/12/2014   ALT 9 11/12/2014   AST 20 11/12/2014   NA 143 03/22/2015   K 3.0* 03/22/2015   CL 100 03/22/2015   CREATININE 0.89 03/22/2015   BUN 13 03/22/2015   CO2 33* 03/22/2015   TSH 0.66 01/29/2015   HGBA1C 6.5 01/29/2015   MICROALBUR 6.3* 05/10/2013        Assessment & Plan:

## 2015-05-29 NOTE — Progress Notes (Signed)
Pre visit review using our clinic review tool, if applicable. No additional management support is needed unless otherwise documented below in the visit note. 

## 2015-05-29 NOTE — Assessment & Plan Note (Signed)
On B12 

## 2015-05-29 NOTE — Assessment & Plan Note (Signed)
Patient with mild hallucinosis-recurrent vision of a young neighborhood child. Non-threatening hallucinations. Normal CT brain June '13. Psychiatric evaluation at time of in patient evaluatin by Dr. 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 Atypical antipsychotic recommended. On Namenda

## 2015-05-29 NOTE — Assessment & Plan Note (Addendum)
Cont w/meds - metformin, basal insulin - Lantus, Off pioglitazone  Lab Results  Component Value Date   HGBA1C 6.5 01/29/2015

## 2015-05-29 NOTE — Assessment & Plan Note (Signed)
Furosemide daily Knee high support hose Elevate feet Cut back on salt

## 2015-05-29 NOTE — Patient Instructions (Signed)
Furosemide 80 mg daily Knee high support hose Elevate feet Cut back on salt

## 2015-06-06 ENCOUNTER — Other Ambulatory Visit: Payer: Self-pay | Admitting: Physician Assistant

## 2015-06-14 ENCOUNTER — Ambulatory Visit: Payer: PRIVATE HEALTH INSURANCE | Admitting: Internal Medicine

## 2015-06-20 ENCOUNTER — Emergency Department (HOSPITAL_COMMUNITY): Payer: Medicare Other

## 2015-06-20 ENCOUNTER — Observation Stay (HOSPITAL_COMMUNITY)
Admission: EM | Admit: 2015-06-20 | Discharge: 2015-06-22 | Disposition: A | Discharge status: Home / Self Care | Payer: Medicare Other | Attending: Internal Medicine | Admitting: Internal Medicine

## 2015-06-20 ENCOUNTER — Observation Stay (HOSPITAL_COMMUNITY): Payer: Medicare Other

## 2015-06-20 ENCOUNTER — Encounter (HOSPITAL_COMMUNITY): Payer: Self-pay | Admitting: Emergency Medicine

## 2015-06-20 DIAGNOSIS — J4489 Other specified chronic obstructive pulmonary disease: Secondary | ICD-10-CM | POA: Diagnosis present

## 2015-06-20 DIAGNOSIS — R200 Anesthesia of skin: Secondary | ICD-10-CM | POA: Diagnosis not present

## 2015-06-20 DIAGNOSIS — I10 Essential (primary) hypertension: Secondary | ICD-10-CM | POA: Diagnosis not present

## 2015-06-20 DIAGNOSIS — R4781 Slurred speech: Secondary | ICD-10-CM | POA: Diagnosis not present

## 2015-06-20 DIAGNOSIS — G309 Alzheimer's disease, unspecified: Secondary | ICD-10-CM | POA: Insufficient documentation

## 2015-06-20 DIAGNOSIS — F22 Delusional disorders: Secondary | ICD-10-CM

## 2015-06-20 DIAGNOSIS — G459 Transient cerebral ischemic attack, unspecified: Secondary | ICD-10-CM | POA: Diagnosis not present

## 2015-06-20 DIAGNOSIS — E876 Hypokalemia: Secondary | ICD-10-CM | POA: Diagnosis present

## 2015-06-20 DIAGNOSIS — J45909 Unspecified asthma, uncomplicated: Secondary | ICD-10-CM | POA: Insufficient documentation

## 2015-06-20 DIAGNOSIS — N179 Acute kidney failure, unspecified: Secondary | ICD-10-CM | POA: Diagnosis not present

## 2015-06-20 DIAGNOSIS — R471 Dysarthria and anarthria: Secondary | ICD-10-CM | POA: Diagnosis not present

## 2015-06-20 DIAGNOSIS — F0280 Dementia in other diseases classified elsewhere without behavioral disturbance: Secondary | ICD-10-CM | POA: Insufficient documentation

## 2015-06-20 DIAGNOSIS — E785 Hyperlipidemia, unspecified: Secondary | ICD-10-CM | POA: Diagnosis present

## 2015-06-20 DIAGNOSIS — E119 Type 2 diabetes mellitus without complications: Secondary | ICD-10-CM | POA: Insufficient documentation

## 2015-06-20 DIAGNOSIS — I6789 Other cerebrovascular disease: Secondary | ICD-10-CM | POA: Diagnosis not present

## 2015-06-20 DIAGNOSIS — E538 Deficiency of other specified B group vitamins: Secondary | ICD-10-CM | POA: Diagnosis present

## 2015-06-20 DIAGNOSIS — J439 Emphysema, unspecified: Secondary | ICD-10-CM | POA: Diagnosis not present

## 2015-06-20 DIAGNOSIS — J449 Chronic obstructive pulmonary disease, unspecified: Secondary | ICD-10-CM | POA: Diagnosis not present

## 2015-06-20 DIAGNOSIS — R531 Weakness: Secondary | ICD-10-CM | POA: Diagnosis not present

## 2015-06-20 DIAGNOSIS — M1990 Unspecified osteoarthritis, unspecified site: Secondary | ICD-10-CM | POA: Insufficient documentation

## 2015-06-20 DIAGNOSIS — G300 Alzheimer's disease with early onset: Secondary | ICD-10-CM | POA: Diagnosis present

## 2015-06-20 LAB — URINALYSIS, ROUTINE W REFLEX MICROSCOPIC
BILIRUBIN URINE: NEGATIVE
GLUCOSE, UA: NEGATIVE mg/dL
Hgb urine dipstick: NEGATIVE
KETONES UR: NEGATIVE mg/dL
Nitrite: NEGATIVE
Protein, ur: 30 mg/dL — AB
Specific Gravity, Urine: 1.011 (ref 1.005–1.030)
Urobilinogen, UA: 0.2 mg/dL (ref 0.0–1.0)
pH: 5.5 (ref 5.0–8.0)

## 2015-06-20 LAB — URINE MICROSCOPIC-ADD ON

## 2015-06-20 LAB — BASIC METABOLIC PANEL
ANION GAP: 13 (ref 5–15)
BUN: 14 mg/dL (ref 6–20)
CHLORIDE: 96 mmol/L — AB (ref 101–111)
CO2: 32 mmol/L (ref 22–32)
Calcium: 9.2 mg/dL (ref 8.9–10.3)
Creatinine, Ser: 1.23 mg/dL — ABNORMAL HIGH (ref 0.44–1.00)
GFR calc Af Amer: 50 mL/min — ABNORMAL LOW (ref >60)
GFR calc non Af Amer: 43 mL/min — ABNORMAL LOW (ref >60)
GLUCOSE: 93 mg/dL (ref 65–99)
POTASSIUM: 2.3 mmol/L — AB (ref 3.5–5.1)
Sodium: 141 mmol/L (ref 135–145)

## 2015-06-20 LAB — CBC
HEMATOCRIT: 39 % (ref 36.0–46.0)
Hemoglobin: 13.3 g/dL (ref 12.0–15.0)
MCH: 29.9 pg (ref 26.0–34.0)
MCHC: 34.1 g/dL (ref 30.0–36.0)
MCV: 87.6 fL (ref 78.0–100.0)
Platelets: 222 10*3/uL (ref 150–400)
RBC: 4.45 MIL/uL (ref 3.87–5.11)
RDW: 13.7 % (ref 11.5–15.5)
WBC: 7.3 10*3/uL (ref 4.0–10.5)

## 2015-06-20 LAB — GLUCOSE, CAPILLARY: GLUCOSE-CAPILLARY: 76 mg/dL (ref 65–99)

## 2015-06-20 LAB — I-STAT TROPONIN, ED: TROPONIN I, POC: 0.05 ng/mL (ref 0.00–0.08)

## 2015-06-20 MED ORDER — POTASSIUM CHLORIDE 10 MEQ/100ML IV SOLN
10.0000 meq | Freq: Once | INTRAVENOUS | Status: AC
  Administered 2015-06-20: 10 meq via INTRAVENOUS
  Filled 2015-06-20: qty 100

## 2015-06-20 MED ORDER — COLESTIPOL HCL 1 G PO TABS
1.0000 g | ORAL_TABLET | Freq: Two times a day (BID) | ORAL | Status: DC
  Administered 2015-06-21 – 2015-06-22 (×3): 1 g via ORAL
  Filled 2015-06-20 (×4): qty 1

## 2015-06-20 MED ORDER — CLONAZEPAM 1 MG PO TABS
1.0000 mg | ORAL_TABLET | Freq: Every evening | ORAL | Status: DC | PRN
  Administered 2015-06-21: 1 mg via ORAL
  Filled 2015-06-20 (×3): qty 1

## 2015-06-20 MED ORDER — BUDESONIDE-FORMOTEROL FUMARATE 80-4.5 MCG/ACT IN AERO
2.0000 | INHALATION_SPRAY | Freq: Two times a day (BID) | RESPIRATORY_TRACT | Status: DC
  Administered 2015-06-21 – 2015-06-22 (×3): 2 via RESPIRATORY_TRACT
  Filled 2015-06-20: qty 6.9

## 2015-06-20 MED ORDER — ALBUTEROL SULFATE HFA 108 (90 BASE) MCG/ACT IN AERS: 2.0000 | INHALATION_SPRAY | Freq: Four times a day (QID) | RESPIRATORY_TRACT | Status: DC | PRN

## 2015-06-20 MED ORDER — LEVOTHYROXINE SODIUM 88 MCG PO TABS
88.0000 ug | ORAL_TABLET | Freq: Every day | ORAL | Status: DC
  Administered 2015-06-21 – 2015-06-22 (×2): 88 ug via ORAL
  Filled 2015-06-20 (×2): qty 1

## 2015-06-20 MED ORDER — FUROSEMIDE 80 MG PO TABS
80.0000 mg | ORAL_TABLET | Freq: Every day | ORAL | Status: DC
  Administered 2015-06-21 – 2015-06-22 (×2): 80 mg via ORAL
  Filled 2015-06-20 (×2): qty 1

## 2015-06-20 MED ORDER — CYANOCOBALAMIN 1000 MCG/ML IJ SOLN: 1000.0000 ug | INTRAMUSCULAR | Status: DC

## 2015-06-20 MED ORDER — INSULIN GLARGINE 100 UNIT/ML ~~LOC~~ SOLN
40.0000 [IU] | Freq: Every day | SUBCUTANEOUS | Status: DC
  Administered 2015-06-21: 40 [IU] via SUBCUTANEOUS
  Filled 2015-06-20 (×2): qty 0.4

## 2015-06-20 MED ORDER — PANTOPRAZOLE SODIUM 40 MG PO TBEC
40.0000 mg | DELAYED_RELEASE_TABLET | Freq: Every day | ORAL | Status: DC
  Administered 2015-06-21 – 2015-06-22 (×2): 40 mg via ORAL
  Filled 2015-06-20 (×2): qty 1

## 2015-06-20 MED ORDER — DONEPEZIL HCL 10 MG PO TABS
10.0000 mg | ORAL_TABLET | Freq: Every day | ORAL | Status: DC
  Administered 2015-06-20 – 2015-06-21 (×2): 10 mg via ORAL
  Filled 2015-06-20 (×2): qty 1

## 2015-06-20 MED ORDER — INSULIN ASPART 100 UNIT/ML ~~LOC~~ SOLN
0.0000 [IU] | Freq: Three times a day (TID) | SUBCUTANEOUS | Status: DC
  Administered 2015-06-21 – 2015-06-22 (×2): 1 [IU] via SUBCUTANEOUS

## 2015-06-20 MED ORDER — OMEGA-3-ACID ETHYL ESTERS 1 G PO CAPS
4000.0000 mg | ORAL_CAPSULE | Freq: Every day | ORAL | Status: DC
Start: 2015-06-21 — End: 2015-06-22
  Administered 2015-06-21 – 2015-06-22 (×2): 4000 mg via ORAL
  Filled 2015-06-20 (×4): qty 4

## 2015-06-20 MED ORDER — ADULT MULTIVITAMIN W/MINERALS CH
1.0000 | ORAL_TABLET | Freq: Every day | ORAL | Status: DC
  Administered 2015-06-21 – 2015-06-22 (×2): 1 via ORAL
  Filled 2015-06-20 (×3): qty 1

## 2015-06-20 MED ORDER — MEMANTINE HCL ER 28 MG PO CP24
28.0000 mg | ORAL_CAPSULE | Freq: Every day | ORAL | Status: DC
  Administered 2015-06-21 – 2015-06-22 (×2): 28 mg via ORAL
  Filled 2015-06-20 (×2): qty 1

## 2015-06-20 MED ORDER — AZELASTINE HCL 0.1 % NA SOLN
1.0000 | Freq: Two times a day (BID) | NASAL | Status: DC
  Administered 2015-06-21: 1 via NASAL
  Filled 2015-06-20: qty 30

## 2015-06-20 MED ORDER — KETOCONAZOLE 2 % EX CREA
1.0000 "application " | TOPICAL_CREAM | Freq: Every day | CUTANEOUS | Status: DC
  Filled 2015-06-20: qty 15

## 2015-06-20 MED ORDER — PRAVASTATIN SODIUM 20 MG PO TABS
20.0000 mg | ORAL_TABLET | Freq: Every evening | ORAL | Status: DC
  Administered 2015-06-20 – 2015-06-21 (×2): 20 mg via ORAL
  Filled 2015-06-20 (×2): qty 1

## 2015-06-20 MED ORDER — POTASSIUM CHLORIDE 10 MEQ/100ML IV SOLN
10.0000 meq | INTRAVENOUS | Status: AC
  Administered 2015-06-20 – 2015-06-21 (×4): 10 meq via INTRAVENOUS
  Filled 2015-06-20 (×4): qty 100

## 2015-06-20 MED ORDER — IRBESARTAN 150 MG PO TABS
150.0000 mg | ORAL_TABLET | Freq: Every day | ORAL | Status: DC
  Administered 2015-06-21 – 2015-06-22 (×2): 150 mg via ORAL
  Filled 2015-06-20 (×2): qty 1

## 2015-06-20 MED ORDER — STROKE: EARLY STAGES OF RECOVERY BOOK
Freq: Once | Status: AC
  Administered 2015-06-20

## 2015-06-20 MED ORDER — CLOPIDOGREL BISULFATE 75 MG PO TABS
75.0000 mg | ORAL_TABLET | Freq: Every day | ORAL | Status: DC
  Administered 2015-06-20 – 2015-06-22 (×3): 75 mg via ORAL
  Filled 2015-06-20 (×3): qty 1

## 2015-06-20 MED ORDER — LEVALBUTEROL HCL 0.63 MG/3ML IN NEBU
0.6300 mg | INHALATION_SOLUTION | Freq: Four times a day (QID) | RESPIRATORY_TRACT | Status: DC | PRN
Start: 2015-06-20 — End: 2015-06-22

## 2015-06-20 MED ORDER — SACCHAROMYCES BOULARDII 250 MG PO CAPS
250.0000 mg | ORAL_CAPSULE | Freq: Two times a day (BID) | ORAL | Status: DC
  Administered 2015-06-21 – 2015-06-22 (×3): 250 mg via ORAL
  Filled 2015-06-20 (×3): qty 1

## 2015-06-20 MED ORDER — ENOXAPARIN SODIUM 40 MG/0.4ML ~~LOC~~ SOLN
40.0000 mg | SUBCUTANEOUS | Status: DC
  Administered 2015-06-21 – 2015-06-22 (×2): 40 mg via SUBCUTANEOUS
  Filled 2015-06-20 (×2): qty 0.4

## 2015-06-20 MED ORDER — HYDROCODONE-ACETAMINOPHEN 5-325 MG PO TABS
1.0000 | ORAL_TABLET | Freq: Four times a day (QID) | ORAL | Status: DC | PRN
  Administered 2015-06-20: 1 via ORAL
  Filled 2015-06-20: qty 1

## 2015-06-20 MED ORDER — POTASSIUM CHLORIDE CRYS ER 20 MEQ PO TBCR
40.0000 meq | EXTENDED_RELEASE_TABLET | Freq: Once | ORAL | Status: AC
  Administered 2015-06-20: 40 meq via ORAL
  Filled 2015-06-20: qty 2

## 2015-06-20 MED ORDER — TIOTROPIUM BROMIDE MONOHYDRATE 18 MCG IN CAPS
18.0000 ug | ORAL_CAPSULE | Freq: Every day | RESPIRATORY_TRACT | Status: DC
  Administered 2015-06-21 – 2015-06-22 (×2): 18 ug via RESPIRATORY_TRACT
  Filled 2015-06-20: qty 5

## 2015-06-20 MED ORDER — POTASSIUM CHLORIDE CRYS ER 20 MEQ PO TBCR: 40.0000 meq | EXTENDED_RELEASE_TABLET | Freq: Once | ORAL | Status: DC

## 2015-06-20 NOTE — Progress Notes (Signed)
Pt arrived to room 4N06 from ED. Pt is alert and oriented and in no distress.  Family at bedside.  Safety measures in place. Will continue to monitor.   Estanislado Emms, RN

## 2015-06-20 NOTE — ED Notes (Signed)
Patient transported to CT 

## 2015-06-20 NOTE — ED Provider Notes (Signed)
CSN: [id number]     Arrival date & time 06/20/15  1656 History   First MD Initiated Contact with Patient 06/20/15 1701     Chief Complaint  Patient presents with  . Aphasia    The patient's daughter in law said the patient has been having slurred speech more so than normal.  The DIL also said she has been having right hand numbness.      (Consider location/radiation/quality/duration/timing/severity/associated sxs/prior Treatment) HPI Comments: Patient here with slurred speech. Last known normal 3 hours ago prior to her nap. Resolved upon EMS arrival.  Patient is a 69 y.o. female presenting with neurologic complaint. The history is provided by the patient.  Neurologic Problem This is a new problem. The current episode started less than 1 hour ago. The problem occurs constantly. The problem has been resolved. Pertinent negatives include no chest pain and no abdominal pain. Nothing aggravates the symptoms. Nothing relieves the symptoms.    Past Medical History  Diagnosis Date  . Personal history of colonic polyps   . Iron deficiency anemia, unspecified   . Other malaise and fatigue   . Chest pain, unspecified   . Other B-complex deficiencies   . Arthritis     Right Sacroiliac joint  . Other and unspecified hyperlipidemia   . Anxiety and depression   . HTN (hypertension)   . Unspecified hypothyroidism   . Type II or unspecified type diabetes mellitus without mention of complication, not stated as uncontrolled   . Allergic rhinitis, cause unspecified   . Unspecified asthma(493.90)   . Chronic airway obstruction, not elsewhere classified   . Abnormal stress test 08/21/2008    Neg  . GERD (gastroesophageal reflux disease)    Past Surgical History  Procedure Laterality Date  . Foot surgery      hammer toe 2004, 2005  . Abdominal hysterectomy  1982    menometorrhagia  . Carpal tunnel release  03/05/2009    bilateral  . Colonoscopy N/A 09/06/2013    Procedure: COLONOSCOPY;   Surgeon: Louis Meckel, MD;  Location: [facility];  Service: Endoscopy;  Laterality: N/A;  . Hot hemostasis N/A 09/06/2013    Procedure: HOT HEMOSTASIS (ARGON PLASMA COAGULATION/BICAP);  Surgeon: Louis Meckel, MD;  Location: [facility];  Service: Endoscopy;  Laterality: N/A;   Family History  Problem Relation Age of Onset  . Hypertension Mother   . Diabetes Mother   . Heart disease Mother     CHF  . Coronary artery disease Mother   . Lung cancer Father   . Hypertension Father   . Diabetes Father   . Liver cancer Father     w/mets  . Prostate cancer Father   . Breast cancer Sister   . Lung disease Brother     Agent Orange Related  . Asthma Brother   . Allergies Brother   . Colon cancer Neg Hx    History  Substance Use Topics  . Smoking status: Former Smoker    Quit date: 06/01/1997  . Smokeless tobacco: Never Used     Comment: Quit 9 years ago.  . Alcohol Use: No   OB History    No data available     Review of Systems  Unable to perform ROS: Dementia  Cardiovascular: Negative for chest pain.  Gastrointestinal: Negative for abdominal pain.      Allergies  Aspirin; Codeine; Demerol; Gabapentin; Latex; Lipitor; Lisinopril; and Zocor  Home Medications   Prior to Admission medications   Medication  Sig Start Date End Date Taking? Authorizing Provider  ACCU-CHEK AVIVA PLUS test strip USE TO TEST BLOOD SUGAR 3-4 TIMES DAILY 05/06/15   Historical Provider, MD  albuterol (PROVENTIL HFA;VENTOLIN HFA) 108 (90 BASE) MCG/ACT inhaler Inhale 2 puffs into the lungs every 6 (six) hours as needed for wheezing or shortness of breath.    Historical Provider, MD  azelastine (ASTELIN) 0.1 % nasal spray INSERT 1 TO 2 SPRAYS INTO EACH NOSTRIL ONCE OR TWICE DAILY AS NEEDED 05/22/15   Waymon Budge, MD  clonazePAM (KLONOPIN) 1 MG tablet Take 1-2 tablets (1-2 mg total) by mouth at bedtime as needed for anxiety. 05/28/15   Aleksei Plotnikov V, MD  conjugated estrogens (PREMARIN)  vaginal cream Place 1 Applicatorful vaginally 2 (two) times daily as needed (vaginal itching).     Historical Provider, MD  cyanocobalamin (,VITAMIN B-12,) 1000 MCG/ML injection Inject 1 mL (1,000 mcg total) into the muscle every 14 (fourteen) days. 03/13/15   Aleksei Plotnikov V, MD  donepezil (ARICEPT) 10 MG tablet TAKE 1 TABLET BY MOUTH EVERY NIGHT AT BEDTIME 03/16/15   Levert Feinstein, MD  esomeprazole (NEXIUM) 40 MG capsule Take 40 mg by mouth daily at 12 noon.    Historical Provider, MD  fish oil-omega-3 fatty acids 1000 MG capsule Take 4,000 mg by mouth daily.     Historical Provider, MD  furosemide (LASIX) 80 MG tablet Take 1 tablet (80 mg total) by mouth daily. 05/29/15   Aleksei Plotnikov V, MD  HYDROcodone-acetaminophen (NORCO/VICODIN) 5-325 MG per tablet Take 1 tablet by mouth every 6 (six) hours as needed for moderate pain. Please fill on or after 06/16/2015 05/29/15   Aleksei Plotnikov V, MD  insulin glargine (LANTUS) 100 UNIT/ML injection Inject 0.4 mLs (40 Units total) into the skin daily. 12/06/14   Aleksei Plotnikov V, MD  INSULIN SYRINGE 1CC/29G 29G X 1/2" 1 ML MISC Use as directed every 14 days for B12 injections. 03/13/15   Aleksei Plotnikov V, MD  ketoconazole (NIZORAL) 2 % cream Apply 1 application topically daily. 12/12/14   Aleksei Plotnikov V, MD  levalbuterol (XOPENEX) 0.63 MG/3ML nebulizer solution Take 1 ampule by nebulization every 6 (six) hours as needed. For wheezing 11/28/12   Waymon Budge, MD  levothyroxine (SYNTHROID, LEVOTHROID) 88 MCG tablet TAKE 1 TABLET BY MOUTH EVERY DAY 04/18/15   Aleksei Plotnikov V, MD  metFORMIN (GLUCOPHAGE) 1000 MG tablet TAKE 1 TABLET BY MOUTH TWICE DAILY WITH A MEAL 03/21/15   Aleksei Plotnikov V, MD  MICRONIZED COLESTIPOL HCL 1 G tablet TAKE 1 TABLET(1 GRAM) BY MOUTH TWICE DAILY 05/09/15   Lori P Hvozdovic, PA-C  MICRONIZED COLESTIPOL HCL 1 G tablet TAKE 1 TABLET(1 GRAM) BY MOUTH TWICE DAILY 06/06/15   Lori P Hvozdovic, PA-C  Multiple Vitamin  (MULTIVITAMIN WITH MINERALS) TABS tablet Take 1 tablet by mouth daily.    Historical Provider, MD  NAMENDA XR 28 MG CP24 24 hr capsule TAKE ONE CAPSULE BY MOUTH EVERY DAY 02/11/15   Butch Penny, NP  pravastatin (PRAVACHOL) 20 MG tablet Take 1 tablet (20 mg total) by mouth every evening. 11/27/14   Aleksei Plotnikov V, MD  saccharomyces boulardii (FLORASTOR) 250 MG capsule Take 1 capsule (250 mg total) by mouth 2 (two) times daily. 05/09/15   Lori P Hvozdovic, PA-C  SYMBICORT 80-4.5 MCG/ACT inhaler INHALE 2 PUFFS BY MOUTH TWICE DAILY 05/20/15   Aleksei Plotnikov V, MD  tiotropium (SPIRIVA) 18 MCG inhalation capsule Place 18 mcg into inhaler and inhale daily.  Historical Provider, MD  valsartan (DIOVAN) 160 MG tablet TAKE 1 TABLET BY MOUTH DAILY 03/21/15   Aleksei Plotnikov V, MD  vitamin A 8000 UNIT capsule Take 8,000 Units by mouth daily.    Historical Provider, MD   SpO2 96% Physical Exam  Constitutional: She appears well-developed and well-nourished. No distress.  HENT:  Head: Normocephalic and atraumatic.  Mouth/Throat: Oropharynx is clear and moist.  Eyes: EOM are normal. Pupils are equal, round, and reactive to light.  Neck: Normal range of motion. Neck supple.  Cardiovascular: Normal rate and regular rhythm.  Exam reveals no friction rub.   No murmur heard. Pulmonary/Chest: Effort normal and breath sounds normal. No respiratory distress. She has no wheezes. She has no rales.  Abdominal: Soft. She exhibits no distension. There is no tenderness. There is no rebound.  Musculoskeletal: Normal range of motion. She exhibits no edema.  Neurological: She is alert. No cranial nerve deficit. She exhibits normal muscle tone. Coordination normal.  Skin: She is not diaphoretic.  Nursing note and vitals reviewed.   ED Course  Procedures (including critical care time) Labs Review Labs Reviewed  CBC  BASIC METABOLIC PANEL  I-STAT TROPOININ, ED    Imaging Review Ct Head Wo  Contrast  06/20/2015   CLINICAL DATA:  Dysarthria  EXAM: CT HEAD WITHOUT CONTRAST  TECHNIQUE: Contiguous axial images were obtained from the base of the skull through the vertex without intravenous contrast.  COMPARISON:  05/12/2012  FINDINGS: No skull fracture is noted. Paranasal sinuses and mastoid air cells are unremarkable.  No intracranial hemorrhage, mass effect or midline shift. Stable atrophy and chronic white matter disease. No definite acute cortical infarction. No mass lesion is noted on this unenhanced scan.  IMPRESSION: No acute intracranial abnormality. Stable atrophy and chronic white matter disease.   Electronically Signed   By: Natasha Mead M.D.   On: 06/20/2015 18:10     EKG Interpretation None     ED ECG REPORT   Date: 06/20/2015  Rate: 63  Rhythm: normal sinus rhythm  QRS Axis: right  Intervals: normal  ST/T Wave abnormalities: nonspecific T wave changes  Conduction Disutrbances:none  Narrative Interpretation:   Old EKG Reviewed: unchanged  I have personally reviewed the EKG tracing and agree with the computerized printout as noted.  MDM   Final diagnoses:  Transient cerebral ischemia, unspecified transient cerebral ischemia type  Hypokalemia    69 year old female here with some dysarthria. She woke up from a nap and had some slurred speech. It had resolved when EMS arrived. No history of stroke. She had no extremity weakness or numbness. She is demented. Here vitals are stable. EKG is normal. She is alert to her age but does not know the year. She denies pain anywhere. She has some cooperation issues with neuro exam but has good strength and sensation in all Shoney's. Cranial nerves are normal. We'll scan her head and check labs here. Concern for TIA. Potassium 2.3, given PO and IV. Will admit for TIA concern.  Elwin Mocha, MD 06/20/15 2020

## 2015-06-20 NOTE — H&P (Signed)
[facility] History and Physical  [name] WUJ:[id number] DOB: 1946-06-04 DOA: 06/20/2015  Referring physician: Jefm Bryant. PCP: Sonda Primes, MD  Specialists: None.  Chief Complaint: Difficulty speaking and right-sided numbness.  HPI: [name] is a 69 y.o. female with history of hypertension, diabetes mellitus, B-12 deficiency and dementia was brought to the ER after patient was found to have difficulty speaking around 5 PM and patient also was complaining of right facial and right upper extremity numbness. Patient's symptoms lasted for half hour following which symptoms completely resolve. On exam patient was nonfocal in the ER and CT of the head do not show anything acute. Patient otherwise denies any visual symptoms difficulty swallowing. Patient has been admitted for further management of TIA. Patient denies any chest pain or shortness of breath nausea vomiting abdominal pain. Patient was found to be hypokalemic.   Review of Systems: As presented in the history of presenting illness, rest negative.  Past Medical History  Diagnosis Date  . Personal history of colonic polyps   . Iron deficiency anemia, unspecified   . Other malaise and fatigue   . Chest pain, unspecified   . Other B-complex deficiencies   . Arthritis     Right Sacroiliac joint  . Other and unspecified hyperlipidemia   . Anxiety and depression   . HTN (hypertension)   . Unspecified hypothyroidism   . Type II or unspecified type diabetes mellitus without mention of complication, not stated as uncontrolled   . Allergic rhinitis, cause unspecified   . Unspecified asthma(493.90)   . Chronic airway obstruction, not elsewhere classified   . Abnormal stress test 08/21/2008    Neg  . GERD (gastroesophageal reflux disease)    Past Surgical History  Procedure Laterality Date  . Foot surgery      hammer toe 2004, 2005  . Abdominal hysterectomy  1982    menometorrhagia  . Carpal tunnel release   03/05/2009    bilateral  . Colonoscopy N/A 09/06/2013    Procedure: COLONOSCOPY;  Surgeon: Louis Meckel, MD;  Location: [facility];  Service: Endoscopy;  Laterality: N/A;  . Hot hemostasis N/A 09/06/2013    Procedure: HOT HEMOSTASIS (ARGON PLASMA COAGULATION/BICAP);  Surgeon: Louis Meckel, MD;  Location: [facility];  Service: Endoscopy;  Laterality: N/A;   Social History:  reports that she quit smoking about 18 years ago. She has never used smokeless tobacco. She reports that she does not drink alcohol or use illicit drugs. Where does patient live home. Can patient participate in ADLs? Yes.  Allergies  Allergen Reactions  . Aspirin Itching  . Codeine Itching  . Demerol Itching    Unknown-unconscious.  . Gabapentin     Rxed in ER; not tolerated  . Latex Itching  . Lipitor [Atorvastatin]     Urticaria   . Lisinopril Itching and Cough    REACTION: causes cough  . Zocor [Simvastatin]     Urticaria     Family History:  Family History  Problem Relation Age of Onset  . Hypertension Mother   . Diabetes Mother   . Heart disease Mother     CHF  . Coronary artery disease Mother   . Lung cancer Father   . Hypertension Father   . Diabetes Father   . Liver cancer Father     w/mets  . Prostate cancer Father   . Breast cancer Sister   . Lung disease Brother     Agent Orange Related  . Asthma Brother   .  Allergies Brother   . Colon cancer Neg Hx       Prior to Admission medications   Medication Sig Start Date End Date Taking? Authorizing Provider  ACCU-CHEK AVIVA PLUS test strip USE TO TEST BLOOD SUGAR 3-4 TIMES DAILY 05/06/15   Historical Provider, MD  albuterol (PROVENTIL HFA;VENTOLIN HFA) 108 (90 BASE) MCG/ACT inhaler Inhale 2 puffs into the lungs every 6 (six) hours as needed for wheezing or shortness of breath.    Historical Provider, MD  azelastine (ASTELIN) 0.1 % nasal spray INSERT 1 TO 2 SPRAYS INTO EACH NOSTRIL ONCE OR TWICE DAILY AS NEEDED 05/22/15   Waymon Budge, MD  clonazePAM (KLONOPIN) 1 MG tablet Take 1-2 tablets (1-2 mg total) by mouth at bedtime as needed for anxiety. 05/28/15   Aleksei Plotnikov V, MD  conjugated estrogens (PREMARIN) vaginal cream Place 1 Applicatorful vaginally 2 (two) times daily as needed (vaginal itching).     Historical Provider, MD  cyanocobalamin (,VITAMIN B-12,) 1000 MCG/ML injection Inject 1 mL (1,000 mcg total) into the muscle every 14 (fourteen) days. 03/13/15   Aleksei Plotnikov V, MD  donepezil (ARICEPT) 10 MG tablet TAKE 1 TABLET BY MOUTH EVERY NIGHT AT BEDTIME 03/16/15   Levert Feinstein, MD  esomeprazole (NEXIUM) 40 MG capsule Take 40 mg by mouth daily at 12 noon.    Historical Provider, MD  fish oil-omega-3 fatty acids 1000 MG capsule Take 4,000 mg by mouth daily.     Historical Provider, MD  furosemide (LASIX) 80 MG tablet Take 1 tablet (80 mg total) by mouth daily. 05/29/15   Aleksei Plotnikov V, MD  HYDROcodone-acetaminophen (NORCO/VICODIN) 5-325 MG per tablet Take 1 tablet by mouth every 6 (six) hours as needed for moderate pain. Please fill on or after 06/16/2015 05/29/15   Aleksei Plotnikov V, MD  insulin glargine (LANTUS) 100 UNIT/ML injection Inject 0.4 mLs (40 Units total) into the skin daily. 12/06/14   Aleksei Plotnikov V, MD  INSULIN SYRINGE 1CC/29G 29G X 1/2" 1 ML MISC Use as directed every 14 days for B12 injections. 03/13/15   Aleksei Plotnikov V, MD  ketoconazole (NIZORAL) 2 % cream Apply 1 application topically daily. 12/12/14   Aleksei Plotnikov V, MD  levalbuterol (XOPENEX) 0.63 MG/3ML nebulizer solution Take 1 ampule by nebulization every 6 (six) hours as needed. For wheezing 11/28/12   Waymon Budge, MD  levothyroxine (SYNTHROID, LEVOTHROID) 88 MCG tablet TAKE 1 TABLET BY MOUTH EVERY DAY 04/18/15   Aleksei Plotnikov V, MD  metFORMIN (GLUCOPHAGE) 1000 MG tablet TAKE 1 TABLET BY MOUTH TWICE DAILY WITH A MEAL 03/21/15   Aleksei Plotnikov V, MD  MICRONIZED COLESTIPOL HCL 1 G tablet TAKE 1 TABLET(1 GRAM) BY MOUTH  TWICE DAILY 05/09/15   Lori P Hvozdovic, PA-C  MICRONIZED COLESTIPOL HCL 1 G tablet TAKE 1 TABLET(1 GRAM) BY MOUTH TWICE DAILY 06/06/15   Lori P Hvozdovic, PA-C  Multiple Vitamin (MULTIVITAMIN WITH MINERALS) TABS tablet Take 1 tablet by mouth daily.    Historical Provider, MD  NAMENDA XR 28 MG CP24 24 hr capsule TAKE ONE CAPSULE BY MOUTH EVERY DAY 02/11/15   Butch Penny, NP  pravastatin (PRAVACHOL) 20 MG tablet Take 1 tablet (20 mg total) by mouth every evening. 11/27/14   Aleksei Plotnikov V, MD  saccharomyces boulardii (FLORASTOR) 250 MG capsule Take 1 capsule (250 mg total) by mouth 2 (two) times daily. 05/09/15   Lori P Hvozdovic, PA-C  SYMBICORT 80-4.5 MCG/ACT inhaler INHALE 2 PUFFS BY MOUTH TWICE DAILY 05/20/15  Aleksei Plotnikov V, MD  tiotropium (SPIRIVA) 18 MCG inhalation capsule Place 18 mcg into inhaler and inhale daily.    Historical Provider, MD  valsartan (DIOVAN) 160 MG tablet TAKE 1 TABLET BY MOUTH DAILY 03/21/15   Aleksei Plotnikov V, MD  vitamin A 8000 UNIT capsule Take 8,000 Units by mouth daily.    Historical Provider, MD    Physical Exam: Filed Vitals:   06/20/15 2000 06/20/15 2038 06/20/15 2230 06/20/15 2234  BP:  173/77 160/104 168/62  Pulse:  65 60   Temp: 98 F (36.7 C) 98.6 F (37 C) 98.6 F (37 C)   TempSrc:  Oral Oral   Resp:  20 18   Height:   (1.6 m)    Weight:  77.8 kg (171 lb 8.3 oz)    SpO2:  98% 97%      General:  Moderately built and nourished.  Eyes: Anicteric no pallor.  ENT: No discharge from the ears eyes nose or mouth.  Neck: No mass felt.  Cardiovascular: S1 and S2 heard.  Respiratory: No rhonchi or crepitations.  Abdomen: Soft nontender bowel sounds present.  Skin: No rash.  Musculoskeletal: Bilateral lower extremity edema.  Psychiatric: Presently alert and awake.  Neurologic: Alert awake oriented to name place. Moves all extremities 5 x 5. No facial asymmetry. Tongue is midline.  Labs on Admission:  Basic Metabolic  Panel:  Recent Labs Lab 06/20/15 1805  NA 141  K 2.3*  CL 96*  CO2 32  GLUCOSE 93  BUN 14  CREATININE 1.23*  CALCIUM 9.2   Liver Function Tests: No results for input(s): AST, ALT, ALKPHOS, BILITOT, PROT, ALBUMIN in the last 168 hours. No results for input(s): LIPASE, AMYLASE in the last 168 hours. No results for input(s): AMMONIA in the last 168 hours. CBC:  Recent Labs Lab 06/20/15 1805  WBC 7.3  HGB 13.3  HCT 39.0  MCV 87.6  PLT 222   Cardiac Enzymes: No results for input(s): CKTOTAL, CKMB, CKMBINDEX, TROPONINI in the last 168 hours.  BNP (last 3 results) No results for input(s): BNP in the last 8760 hours.  ProBNP (last 3 results) No results for input(s): PROBNP in the last 8760 hours.  CBG:  Recent Labs Lab 06/20/15 2214  GLUCAP 76    Radiological Exams on Admission: Ct Head Wo Contrast  06/20/2015   CLINICAL DATA:  Dysarthria  EXAM: CT HEAD WITHOUT CONTRAST  TECHNIQUE: Contiguous axial images were obtained from the base of the skull through the vertex without intravenous contrast.  COMPARISON:  05/12/2012  FINDINGS: No skull fracture is noted. Paranasal sinuses and mastoid air cells are unremarkable.  No intracranial hemorrhage, mass effect or midline shift. Stable atrophy and chronic white matter disease. No definite acute cortical infarction. No mass lesion is noted on this unenhanced scan.  IMPRESSION: No acute intracranial abnormality. Stable atrophy and chronic white matter disease.   Electronically Signed   By: Natasha Mead M.D.   On: 06/20/2015 18:10    EKG: Independently reviewed. Normal sinus rhythm with atrial premature complexes with prolonged QT.  Assessment/Plan Principal Problem:   TIA (transient ischemic attack) Active Problems:   B12 deficiency   Essential hypertension   Asthma with COPD   Dementia of the Alzheimer's type, with early onset, with delusions   Hypokalemia   ARF (acute renal failure)   Diabetes mellitus type 2,  controlled   1. TIA - I have discussed on-call neurologist Dr. Hosie Poisson. Dr. Hosie Poisson has advised TIA workup  at this time for which MRI/MRA brain 2-D echo and carotid Dopplers have been ordered. Patient is placed on antiplatelets agents. 2. Hypokalemia - probably from Lasix use. Patient's EKG does show prolonged QT. Replace potassium and recheck metabolic panel. Check magnesium levels. 3. Diabetes mellitus type 2 - check hemoglobin A1c. Hold metformin while inpatient. Continue other home medications. On sliding scale coverage. 4. Mild renal failure probably acute - if that is further worsening may have to hold Lasix and ARB. 5. Hypertension - see #4. 6. B2 deficiency on replacement. 7. Asthma with COPD presently not wheezing. 8. Dementia continue present medications. 9. Prolonged QT and EKG - replace potassium and recheck EKG.  I reviewed patient's old charts and personally reviewed chest x-ray. Discussed with on-call neurologist.   DVT Prophylaxis Lovenox.  Code Status: Full code. Patient initially was DO NOT RESUSCITATE but patient canceled it after discussing with daughter.  Family Communication: Patient's daughter.  Disposition Plan: Admit for observation.    Loriene Taunton N. [facility] Pager [phone].  If 7PM-7AM, please contact night-coverage [email or web address] Password [facility] 06/20/2015, 10:36 PM

## 2015-06-20 NOTE — ED Notes (Addendum)
The patient's daughter in law said the patient has been having slurred speech more so than normal.  The DIL also said she has been having right hand numbness.   The patient has had slurred speech before this but it has gotten worse.  She is also having right hand numbness that is new.  Symptoms have resolved per daughter in law.  She is here to be evaluated. EMS performed a stroke screen and it was negative.  Patient denies pain .

## 2015-06-20 NOTE — ED Notes (Signed)
CRITICAL VALUE ALERT  Critical value received:  Potassium 2.3  Date of notification:  06/20/2015  Time of notification:  1900  Critical value read back:Yes.    Nurse who received alert:  MG Reeda Soohoo,RN  MD notified (1st page):  Dr. Gwendolyn Grant  Time of first page:  1900  MD notified (2nd page):  Time of second page:  Responding MD:  Dr. Gwendolyn Grant  Time MD responded:  1900

## 2015-06-21 ENCOUNTER — Observation Stay (HOSPITAL_COMMUNITY): Payer: Medicare Other

## 2015-06-21 ENCOUNTER — Observation Stay (HOSPITAL_BASED_OUTPATIENT_CLINIC_OR_DEPARTMENT_OTHER): Payer: Medicare Other

## 2015-06-21 ENCOUNTER — Telehealth: Payer: Self-pay | Admitting: Internal Medicine

## 2015-06-21 ENCOUNTER — Other Ambulatory Visit: Payer: Self-pay

## 2015-06-21 DIAGNOSIS — G300 Alzheimer's disease with early onset: Secondary | ICD-10-CM

## 2015-06-21 DIAGNOSIS — G458 Other transient cerebral ischemic attacks and related syndromes: Secondary | ICD-10-CM | POA: Diagnosis not present

## 2015-06-21 DIAGNOSIS — F0280 Dementia in other diseases classified elsewhere without behavioral disturbance: Secondary | ICD-10-CM

## 2015-06-21 DIAGNOSIS — G459 Transient cerebral ischemic attack, unspecified: Secondary | ICD-10-CM

## 2015-06-21 DIAGNOSIS — E785 Hyperlipidemia, unspecified: Secondary | ICD-10-CM | POA: Diagnosis present

## 2015-06-21 DIAGNOSIS — R479 Unspecified speech disturbances: Secondary | ICD-10-CM | POA: Diagnosis not present

## 2015-06-21 LAB — COMPREHENSIVE METABOLIC PANEL
ALBUMIN: 3 g/dL — AB (ref 3.5–5.0)
ALT: 13 U/L — ABNORMAL LOW (ref 14–54)
AST: 21 U/L (ref 15–41)
Alkaline Phosphatase: 55 U/L (ref 38–126)
Anion gap: 8 (ref 5–15)
BILIRUBIN TOTAL: 1.4 mg/dL — AB (ref 0.3–1.2)
BUN: 11 mg/dL (ref 6–20)
CALCIUM: 8.3 mg/dL — AB (ref 8.9–10.3)
CO2: 31 mmol/L (ref 22–32)
CREATININE: 1.09 mg/dL — AB (ref 0.44–1.00)
Chloride: 101 mmol/L (ref 101–111)
GFR calc Af Amer: 58 mL/min — ABNORMAL LOW (ref >60)
GFR calc non Af Amer: 50 mL/min — ABNORMAL LOW (ref >60)
GLUCOSE: 134 mg/dL — AB (ref 65–99)
Potassium: 3 mmol/L — ABNORMAL LOW (ref 3.5–5.1)
SODIUM: 140 mmol/L (ref 135–145)
TOTAL PROTEIN: 5.6 g/dL — AB (ref 6.5–8.1)

## 2015-06-21 LAB — LIPID PANEL
CHOLESTEROL: 232 mg/dL — AB (ref 0–200)
HDL: 35 mg/dL — AB (ref >40)
LDL CALC: 165 mg/dL — AB (ref 0–99)
Total CHOL/HDL Ratio: 6.6 RATIO
Triglycerides: 160 mg/dL — ABNORMAL HIGH (ref <150)
VLDL: 32 mg/dL (ref 0–40)

## 2015-06-21 LAB — MAGNESIUM: Magnesium: 1 mg/dL — ABNORMAL LOW (ref 1.7–2.4)

## 2015-06-21 LAB — GLUCOSE, CAPILLARY
GLUCOSE-CAPILLARY: 109 mg/dL — AB (ref 65–99)
GLUCOSE-CAPILLARY: 123 mg/dL — AB (ref 65–99)
GLUCOSE-CAPILLARY: 135 mg/dL — AB (ref 65–99)
GLUCOSE-CAPILLARY: 91 mg/dL (ref 65–99)
Glucose-Capillary: 54 mg/dL — ABNORMAL LOW (ref 65–99)

## 2015-06-21 MED ORDER — HYDROCODONE-ACETAMINOPHEN 5-325 MG PO TABS: 1.0000 | ORAL_TABLET | Freq: Four times a day (QID) | ORAL | Status: DC | PRN

## 2015-06-21 MED ORDER — POTASSIUM CHLORIDE CRYS ER 20 MEQ PO TBCR
40.0000 meq | EXTENDED_RELEASE_TABLET | Freq: Three times a day (TID) | ORAL | Status: DC
  Administered 2015-06-21 – 2015-06-22 (×4): 40 meq via ORAL
  Filled 2015-06-21 (×4): qty 2

## 2015-06-21 NOTE — Evaluation (Addendum)
Physical Therapy Evaluation Patient Details Name: [name] MRN: [id number] DOB: 10-08-1946 Today's Date: 06/21/2015   History of Present Illness  69 yo female admitted with R hand numbness and slurred speech with symptoms resolving with EMS. TIA workup underway. CT (_) MRI pending PMH: B12 deficiency iron deficiency, arthritis, anxiety depression, DMII, GERD, foot surg x36m bil carpal tunnel release, hot memostasis  Clinical Impression  Pt presents with decreased balance, decreased cognition (esp attention and awareness), and generalized weakness.  Feel that she will need 24/7 hands on assist at home, therefore PT recommends SNF level of care.  Family in agreement and CSW to speak with them today.  Continued acute services to address deficits.     Follow Up Recommendations SNF;Supervision/Assistance - 24 hour    Equipment Recommendations  Rolling walker with 5" wheels    Recommendations for Other Services       Precautions / Restrictions Precautions Precautions: Fall Precaution Comments: decreased sustained attention Restrictions Weight Bearing Restrictions: No      Mobility  Bed Mobility Overal bed mobility: Needs Assistance Bed Mobility: Supine to Sit     Supine to sit: Min assist     General bed mobility comments: Requires assist to scoot to EOB, unable to follow cues for scooting.   Transfers Overall transfer level: Needs assistance Equipment used: Rolling walker (2 wheeled) Transfers: Sit to/from Stand Sit to Stand: Min assist         General transfer comment: Pt continues to attempt to pull up on RW, despite cues.  Min A to steady during transfers.   Ambulation/Gait Ambulation/Gait assistance: Min assist;Mod assist Ambulation Distance (Feet): 100 Feet Assistive device: Rolling walker (2 wheeled) Gait Pattern/deviations: Step-through pattern;Decreased stride length;Trunk flexed;Staggering left;Staggering right     General Gait Details: Pt very  unsteady with gait, despite use of RW.  Requires min A to steady pt as well as assist to steady and guide RW throughout gait.  Max verbal cues to maintain position inside of RW, esp when turning to sit or turning directions.   Stairs            Wheelchair Mobility    Modified Rankin (Stroke Patients Only)       Balance Overall balance assessment: Needs assistance Sitting-balance support: Feet supported;No upper extremity supported Sitting balance-Leahy Scale: Poor   Postural control: Posterior lean Standing balance support: During functional activity Standing balance-Leahy Scale: Poor Standing balance comment: pt requires use of UE support to maintain balance.                              Pertinent Vitals/Pain      Home Living Family/patient expects to be discharged to:: Private residence Living Arrangements: Children;Other relatives Available Help at Discharge: Family;Available PRN/intermittently Type of Home: House   Entrance Stairs-Rails: Can reach both;Left;Right Entrance Stairs-Number of Steps: 5 Home Layout: One level Home Equipment: Shower seat;Cane - single point;Hand held shower head Additional Comments: Son Daughter and granddaugther reside in the home with patient. Son Gabriel Rung has POA and lives 5 hours away. family residing in the home reports being with patient 22 / 7 however other family in the room report this is not accurate information. Daughter present reports living a few houses away and patient is home alone more than with family.      Prior Function Level of Independence: Needs assistance   Gait / Transfers Assistance Needed: furniture walks in the home and  uses cane outside  ADL's / Homemaking Assistance Needed: requires (A) on occassion per family.         Hand Dominance   Dominant Hand: Right    Extremity/Trunk Assessment   Upper Extremity Assessment: Overall WFL for tasks assessed           Lower Extremity Assessment:  Generalized weakness      Cervical / Trunk Assessment: Kyphotic  Communication   Communication: No difficulties  Cognition Arousal/Alertness: Awake/alert Behavior During Therapy: Flat affect Overall Cognitive Status: History of cognitive impairments - at baseline                      General Comments      Exercises        Assessment/Plan    PT Assessment Patient needs continued PT services  PT Diagnosis Difficulty walking;Generalized weakness   PT Problem List Decreased strength;Decreased activity tolerance;Decreased balance;Decreased mobility;Decreased cognition;Decreased coordination;Decreased knowledge of use of DME;Decreased safety awareness;Decreased knowledge of precautions  PT Treatment Interventions DME instruction;Gait training;Functional mobility training;Therapeutic activities;Therapeutic exercise;Balance training;Cognitive remediation;Patient/family education   PT Goals (Current goals can be found in the Care Plan section) Acute Rehab PT Goals Patient Stated Goal: none stated PT Goal Formulation: With patient/family Time For Goal Achievement: 06/28/15 Potential to Achieve Goals: Good    Frequency Min 3X/week   Barriers to discharge Decreased caregiver support      Co-evaluation               End of Session   Activity Tolerance: Patient limited by fatigue Patient left: in chair;with call bell/phone within reach;with family/visitor present Nurse Communication:  (notified family to let RN know if they left for chair alarm)    Functional Assessment Tool Used: clinical judgement Functional Limitation: Mobility: Walking and moving around Mobility: Walking and Moving Around Current Status (Z6109): At least 40 percent but less than 60 percent impaired, limited or restricted Mobility: Walking and Moving Around Goal Status [phone]): At least 20 percent but less than 40 percent impaired, limited or restricted    Time: 1314-1340 PT Time Calculation  (min) (ACUTE ONLY): 26 min   Charges:   PT Evaluation $Initial PT Evaluation Tier I: 1 Procedure PT Treatments $Gait Training: 8-22 mins   PT G Codes:   PT G-Codes **NOT FOR INPATIENT CLASS** Functional Assessment Tool Used: clinical judgement Functional Limitation: Mobility: Walking and moving around Mobility: Walking and Moving Around Current Status (U9811): At least 40 percent but less than 60 percent impaired, limited or restricted Mobility: Walking and Moving Around Goal Status [phone]): At least 20 percent but less than 40 percent impaired, limited or restricted    Vista Deck 06/21/2015, 1:58 PM

## 2015-06-21 NOTE — Telephone Encounter (Signed)
Ok Thx 

## 2015-06-21 NOTE — Clinical Social Work Note (Signed)
Clinical Social Worker has completed psychosocial assessment. Full psychosocial assessment to follow.   Anticipated discharge on Saturday, 7/23. CSW remains available as needed.  Derenda Fennel, MSW, LCSWA [phone] 06/21/2015 4:35 PM

## 2015-06-21 NOTE — Progress Notes (Signed)
VASCULAR LAB PRELIMINARY  PRELIMINARY  PRELIMINARY  PRELIMINARY  Carotid duplex  completed.    Preliminary report:  Bilateral:  1-39% ICA stenosis.  Vertebral artery flow is antegrade.      Hubbert Landrigan, RVT 06/21/2015, 2:55 PM

## 2015-06-21 NOTE — Clinical Social Work Placement (Signed)
   CLINICAL SOCIAL WORK PLACEMENT  NOTE  Date:  06/21/2015  Patient Details  Name: MAKINZE JANI MRN: [id number] Date of Birth: 22-Feb-1946  Clinical Social Work is seeking post-discharge placement for this patient at the Skilled  Nursing Facility level of care (*CSW will initial, date and re-position this form in  chart as items are completed):  Yes   Patient/family provided with [place] Clinical Social Work Department's list of facilities offering this level of care within the geographic area requested by the patient (or if unable, by the patient's family).  Yes   Patient/family informed of their freedom to choose among providers that offer the needed level of care, that participate in Medicare, Medicaid or managed care program needed by the patient, have an available bed and are willing to accept the patient.  Yes   Patient/family informed of [facility]'s ownership interest in [facility] and [facility], as well as of the fact that they are under no obligation to receive care at these facilities.  PASRR submitted to EDS on 06/21/15     PASRR number received on 06/21/15     Existing PASRR number confirmed on       FL2 transmitted to all facilities in geographic area requested by pt/family on 06/21/15     FL2 transmitted to all facilities within larger geographic area on       Patient informed that his/her managed care company has contracts with or will negotiate with certain facilities, including the following:            Patient/family informed of bed offers received.  Patient chooses bed at       Physician recommends and patient chooses bed at      Patient to be transferred to   on  .  Patient to be transferred to facility by       Patient family notified on   of transfer.  Name of family member notified:        PHYSICIAN Please prepare priority discharge summary, including medications, Please sign FL2     Additional Comment: FL-2 ON CHART FOR MD  SIGNATURE.   _______________________________________________ Derenda Fennel, MSW, LCSWA [phone] 06/21/2015 9:45 PM

## 2015-06-21 NOTE — Progress Notes (Signed)
[facility] PROGRESS NOTE  [name] ZOX:[id number] DOB: 1946-11-24 DOA: 06/20/2015 PCP: Sonda Primes, MD  Assessment/Plan:  Principal Problem:   TIA (transient ischemic attack):  MRI negative. Echocardiogram and carotid Dopplers pending.  LDL 165. Patient's chart reports an intolerance to statins. Physical therapy and occupational therapy recommending skilled nursing facility. Will consult social work. Active Problems:   B12 deficiency   Essential hypertension   Asthma with COPD   Dementia of the Alzheimer's type, with early onset, with delusions   Hypokalemia:  Improved but still low. We'll replete. Magnesium normal.   ARF (acute renal failure) improving   Diabetes mellitus type 2, controlled   Hyperlipidemia   Code Status:  full Family Communication:  Daughter at bedside Disposition Plan:  Skilled nursing facility tomorrow if stable and tests back. Consultants:    Procedures:     Antibiotics:    HPI/Subjective: No complaints. Family reports about at her baseline.  Objective: Filed Vitals:   06/21/15 1010  BP: 146/90  Pulse: 61  Temp: 98.1 F (36.7 C)  Resp: 18   No intake or output data in the 24 hours ending 06/21/15 1405 Filed Weights   06/20/15 2038  Weight: 77.8 kg (171 lb 8.3 oz)    Exam:   General:  Alert. Cooperative. Confused.  Cardiovascular: Regular rate rhythm without murmurs gallops rubs  Respiratory: Clear to auscultation bilaterally without wheezes rhonchi or rales  Abdomen: Soft nontender nondistended  Ext: No clubbing cyanosis or edema  Neurologic: Cranial nerves intact. Motor 5 out of 5.  Basic Metabolic Panel:  Recent Labs Lab 06/20/15 1805 06/21/15 0323  NA 141 140  K 2.3* 3.0*  CL 96* 101  CO2 32 31  GLUCOSE 93 134*  BUN 14 11  CREATININE 1.23* 1.09*  CALCIUM 9.2 8.3*  MG  --  1.0*   Liver Function Tests:  Recent Labs Lab 06/21/15 0323  AST 21  ALT 13*  ALKPHOS 55  BILITOT 1.4*  PROT  5.6*  ALBUMIN 3.0*   No results for input(s): LIPASE, AMYLASE in the last 168 hours. No results for input(s): AMMONIA in the last 168 hours. CBC:  Recent Labs Lab 06/20/15 1805  WBC 7.3  HGB 13.3  HCT 39.0  MCV 87.6  PLT 222   Cardiac Enzymes: No results for input(s): CKTOTAL, CKMB, CKMBINDEX, TROPONINI in the last 168 hours. BNP (last 3 results) No results for input(s): BNP in the last 8760 hours.  ProBNP (last 3 results) No results for input(s): PROBNP in the last 8760 hours.  CBG:  Recent Labs Lab 06/20/15 2214 06/21/15 0656 06/21/15 1146  GLUCAP 76 109* 123*    No results found for this or any previous visit (from the past 240 hour(s)).   Studies: Dg Chest 2 View  06/21/2015   CLINICAL DATA:  69 year old female with transient ischemic attack.  EXAM: CHEST  2 VIEW  COMPARISON:  Radiograph dated 03/22/2015  FINDINGS: Two views of the chest demonstrate emphysematous changes of the lungs. There is no focal consolidation, pleural effusion, or pneumothorax. The cardiomediastinal silhouette is within normal limits. Osteopenia with degenerative changes of the spine. No acute fracture.  IMPRESSION: No active cardiopulmonary disease.   Electronically Signed   By: Elgie Collard M.D.   On: 06/21/2015 03:15   Ct Head Wo Contrast  06/20/2015   CLINICAL DATA:  Dysarthria  EXAM: CT HEAD WITHOUT CONTRAST  TECHNIQUE: Contiguous axial images were obtained from the base of the skull through the vertex without  intravenous contrast.  COMPARISON:  05/12/2012  FINDINGS: No skull fracture is noted. Paranasal sinuses and mastoid air cells are unremarkable.  No intracranial hemorrhage, mass effect or midline shift. Stable atrophy and chronic white matter disease. No definite acute cortical infarction. No mass lesion is noted on this unenhanced scan.  IMPRESSION: No acute intracranial abnormality. Stable atrophy and chronic white matter disease.   Electronically Signed   By: Natasha Mead M.D.   On:  06/20/2015 18:10   Mr Brain Wo Contrast  06/21/2015   CLINICAL DATA:  69 year old female with episode of abnormal speech and right side weakness. Symptoms resolved after about a 0.5 hour. TIA. Initial encounter.  EXAM: MRI HEAD WITHOUT CONTRAST  MRA HEAD WITHOUT CONTRAST  TECHNIQUE: Multiplanar, multiecho pulse sequences of the brain and surrounding structures were obtained without intravenous contrast. Angiographic images of the head were obtained using MRA technique without contrast.  COMPARISON:  Head CT without contrast 06/20/2015. Brain MRI 02/13/2015, and earlier.  FINDINGS: MRI HEAD FINDINGS  Major intracranial vascular flow voids are stable. No restricted diffusion or evidence of acute infarction.  Stable cerebral volume. No midline shift, mass effect, evidence of mass lesion, ventriculomegaly, extra-axial collection or acute intracranial hemorrhage. Cervicomedullary junction and pituitary are within normal limits. Stable mild for age nonspecific cerebral white matter T2 and FLAIR hyperintensity, mostly periventricular. Deep gray matter nuclei, brainstem, and cerebellum remain within normal limits.  Visible internal auditory structures appear normal. Stable mild right mastoid effusion. Negative nasopharynx. Negative paranasal sinuses. Stable orbit soft tissues. Negative scalp soft tissues. Normal bone marrow signal. Negative visualized cervical spine.  MRA HEAD FINDINGS  Mildly degraded by motion despite repeated imaging attempts. Antegrade flow in the posterior circulation with codominant distal vertebral arteries. Normal PICA origins. Patent vertebrobasilar junction. No basilar artery stenosis. SCA and PCA origins are normal. Posterior communicating arteries are diminutive or absent. Bilateral PCA branches are within normal limits.  Antegrade flow in both ICA siphons. Calcified siphon atherosclerosis is evident. Up to mild bilateral cavernous segment siphon stenosis. Normal ophthalmic artery origins.   Normal carotid termini, MCA and ACA origins. Anterior communicating artery and visualized ACA branches are within normal limits. Bilateral MCA M1 segments are within normal limits. Both MCA bifurcations are patent. Visualized bilateral MCA branches are within normal limits.  IMPRESSION: 1. No acute intracranial abnormality. Stable mild nonspecific cerebral white matter signal changes since March. 2. 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 Otherwise negative intracranial MRA.   Electronically Signed   By: Odessa Fleming M.D.   On: 06/21/2015 10:15   Mr Maxine Glenn Head/brain Wo Cm  06/21/2015   CLINICAL DATA:  69 year old female with episode of abnormal speech and right side weakness. Symptoms resolved after about a 0.5 hour. TIA. Initial encounter.  EXAM: MRI HEAD WITHOUT CONTRAST  MRA HEAD WITHOUT CONTRAST  TECHNIQUE: Multiplanar, multiecho pulse sequences of the brain and surrounding structures were obtained without intravenous contrast. Angiographic images of the head were obtained using MRA technique without contrast.  COMPARISON:  Head CT without contrast 06/20/2015. Brain MRI 02/13/2015, and earlier.  FINDINGS: MRI HEAD FINDINGS  Major intracranial vascular flow voids are stable. No restricted diffusion or evidence of acute infarction.  Stable cerebral volume. No midline shift, mass effect, evidence of mass lesion, ventriculomegaly, extra-axial collection or acute intracranial hemorrhage. Cervicomedullary junction and pituitary are within normal limits. Stable mild for age nonspecific cerebral white matter T2 and FLAIR hyperintensity, mostly periventricular. Deep gray matter nuclei, brainstem, and cerebellum remain  within normal limits.  Visible internal auditory structures appear normal. Stable mild right mastoid effusion. Negative nasopharynx. Negative paranasal sinuses. Stable orbit soft tissues. Negative scalp soft tissues. Normal bone marrow signal. Negative visualized cervical spine.  MRA HEAD  FINDINGS  Mildly degraded by motion despite repeated imaging attempts. Antegrade flow in the posterior circulation with codominant distal vertebral arteries. Normal PICA origins. Patent vertebrobasilar junction. No basilar artery stenosis. SCA and PCA origins are normal. Posterior communicating arteries are diminutive or absent. Bilateral PCA branches are within normal limits.  Antegrade flow in both ICA siphons. Calcified siphon atherosclerosis is evident. Up to mild bilateral cavernous segment siphon stenosis. Normal ophthalmic artery origins.  Normal carotid termini, MCA and ACA origins. Anterior communicating artery and visualized ACA branches are within normal limits. Bilateral MCA M1 segments are within normal limits. Both MCA bifurcations are patent. Visualized bilateral MCA branches are within normal limits.  IMPRESSION: 1. No acute intracranial abnormality. Stable mild nonspecific cerebral white matter signal changes since March. 2. 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 Otherwise negative intracranial MRA.   Electronically Signed   By: Odessa Fleming M.D.   On: 06/21/2015 10:15    Scheduled Meds: . azelastine  1 spray Each Nare BID  . budesonide-formoterol  2 puff Inhalation BID  . clopidogrel  75 mg Oral Daily  . colestipol  1 g Oral BID  . [START ON 06/24/2015] cyanocobalamin  1,000 mcg Intramuscular Q14 Days  . donepezil  10 mg Oral QHS  . enoxaparin (LOVENOX) injection  40 mg Subcutaneous Q24H  . furosemide  80 mg Oral Daily  . insulin aspart  0-9 Units Subcutaneous TID WC  . insulin glargine  40 Units Subcutaneous Daily  . irbesartan  150 mg Oral Daily  . ketoconazole  1 application Topical Daily  . levothyroxine  88 mcg Oral QAC breakfast  . memantine  28 mg Oral Daily  . multivitamin with minerals  1 tablet Oral Daily  . omega-3 acid ethyl esters  4,000 mg Oral Daily  . pantoprazole  40 mg Oral Daily  . potassium chloride SA  40 mEq Oral TID  . pravastatin  20 mg  Oral QPM  . saccharomyces boulardii  250 mg Oral BID  . tiotropium  18 mcg Inhalation Daily   Continuous Infusions:   Time spent: 25 minutes  Crystalyn Delia L  [facility] [email or web address], password [facility] 06/21/2015, 2:05 PM

## 2015-06-21 NOTE — Evaluation (Signed)
Occupational Therapy Evaluation Patient Details Name: [name] MRN: [id number] DOB: 1945/12/03 Today's Date: 06/21/2015    History of Present Illness 69 yo female admitted with R hand numbness and slurred speech with symptoms resolving with EMS. TIA workup underway. CT (_) MRI pending PMH: B12 deficiency iron deficiency, arthritis, anxiety depression, DMII, GERD, foot surg x54m bil carpal tunnel release, hot memostasis   Clinical Impression   PT admitted with R hand numbness and speech deficits. Pt currently with functional limitiations due to the deficits listed below (see OT problem list). PTA living at home with family assistance PRN. Family reports needing more assistance than currently available. There currently is some family disagreement with assistance required. Son that resides in the home does not fully agree with care other siblings are trying to provide. Medical staff need to direct all decisions to patient and POA son Gabriel Rung that is Not present. Pt will benefit from skilled OT to increase their independence and safety with adls and balance to allow discharge SNF.   SW- please address levels of care that are provided and covered by insurance if denied SNF placement. Aide / HHOT etc. Please direct call decision making to the son that is POA.      Follow Up Recommendations  SNF    Equipment Recommendations  Other (comment) (RW)    Recommendations for Other Services       Precautions / Restrictions Precautions Precautions: Fall      Mobility Bed Mobility Overal bed mobility: Needs Assistance Bed Mobility: Supine to Sit;Sit to Supine     Supine to sit: Min assist Sit to supine: Min assist   General bed mobility comments: cues to continue to progress to EOB  Transfers Overall transfer level: Needs assistance Equipment used: 1 person hand held assist Transfers: Sit to/from Stand Sit to Stand: Min assist         General transfer comment: reaching for  environmental supports and verbalized feeling "like i am going to fall"    Balance Overall balance assessment: Needs assistance Sitting-balance support: No upper extremity supported;Feet supported Sitting balance-Leahy Scale: Poor   Postural control: Posterior lean Standing balance support: Single extremity supported;During functional activity Standing balance-Leahy Scale: Fair                              ADL Overall ADL's : Needs assistance/impaired Eating/Feeding: Set up;Sitting Eating/Feeding Details (indicate cue type and reason): family helping with meal tray on arrival Grooming: Wash/dry hands;Minimal assistance;Standing Grooming Details (indicate cue type and reason): needed cues and (A) to use soap dispenser.                  Toilet Transfer: Moderate assistance;Regular Toilet;Grab bars Toilet Transfer Details (indicate cue type and reason): needed mod cueing to turn and complete toilet transfer Toileting- Clothing Manipulation and Hygiene: Minimal assistance;Sit to/from stand Toileting - Clothing Manipulation Details (indicate cue type and reason): pt attempting to transfer with pants doff. Pt cued to don pants and pulling half way and attempting to ambulate again. pt required (A) to don clothing fully     Functional mobility during ADLs: Moderate assistance General ADL Comments: Pt reaching for hand held (A) of daughter upon standing. Pt able to verbalize all family members by name in the room and relationship. Pt very slow processing noted and decr problem solving. family notes this is a change from baseline     Vision Additional Comments: OT  to further assess vision. Unable to complete at this time due to MRI transport arriving    Perception     Praxis      Pertinent Vitals/Pain Pain Assessment: No/denies pain     Hand Dominance Right   Extremity/Trunk Assessment Upper Extremity Assessment Upper Extremity Assessment: Overall WFL for tasks  assessed   Lower Extremity Assessment Lower Extremity Assessment: Defer to PT evaluation   Cervical / Trunk Assessment Cervical / Trunk Assessment: Kyphotic   Communication Communication Communication: No difficulties   Cognition Arousal/Alertness: Awake/alert Behavior During Therapy: Flat affect Overall Cognitive Status: History of cognitive impairments - at baseline                     General Comments       Exercises       Shoulder Instructions      Home Living Family/patient expects to be discharged to:: Private residence Living Arrangements: Children Available Help at Discharge: Family;Available PRN/intermittently Type of Home: House Home Access: Stairs to enter [employer] of Steps: 5 Entrance Stairs-Rails: Can reach both;Left;Right Home Layout: One level     Bathroom Shower/Tub: Producer, television/film/video: Standard     Home Equipment: Shower seat;Cane - single point;Hand held shower head   Additional Comments: Son Daughter and granddaugther reside in the home with patient. Son Gabriel Rung has POA and lives 5 hours away. family residing in the home reports being with patient 22 / 7 however other family in the room report this is not accurate information. Daughter present reports living a few houses away and patient is home alone more than with family.        Prior Functioning/Environment Level of Independence: Needs assistance  Gait / Transfers Assistance Needed: furniture walks in the home and uses cane outside ADL's / Homemaking Assistance Needed: requires (A) on occassion per family.         OT Diagnosis: Generalized weakness;Cognitive deficits   OT Problem List: Decreased strength;Decreased activity tolerance;Impaired balance (sitting and/or standing);Decreased cognition;Decreased safety awareness;Decreased knowledge of use of DME or AE;Decreased knowledge of precautions   OT Treatment/Interventions: Self-care/ADL  training;Therapeutic exercise;DME and/or AE instruction;Therapeutic activities;Cognitive remediation/compensation;Patient/family education;Balance training    OT Goals(Current goals can be found in the care plan section) Acute Rehab OT Goals Patient Stated Goal: none stated OT Goal Formulation: With patient/family Time For Goal Achievement: 07/05/15 Potential to Achieve Goals: Good  OT Frequency: Min 2X/week   Barriers to D/C: Decreased caregiver support  per family members present -son and daughter in law are not at home as much as being reported to staff. per daughter to patient the son that lives with patient has substance abuse history       Co-evaluation              End of Session Equipment Utilized During Treatment: Gait belt Nurse Communication: Mobility status;Precautions  Activity Tolerance: Patient tolerated treatment well Patient left: in bed;Other (comment) (transport to MRI)   Time: 2956-2130 OT Time Calculation (min): 26 min Charges:  OT General Charges $OT Visit: 1 Procedure OT Evaluation $Initial OT Evaluation Tier I: 1 Procedure G-Codes: OT G-codes **NOT FOR INPATIENT CLASS** Functional Assessment Tool Used: clinical judgement Functional Limitation: Self care Self Care Current Status (Q6578): At least 1 percent but less than 20 percent impaired, limited or restricted Self Care Goal Status (I6962): At least 1 percent but less than 20 percent impaired, limited or restricted  Harolyn Rutherford 06/21/2015, 9:40 AM Pager:  [phone]    

## 2015-06-21 NOTE — Clinical Social Work Note (Signed)
Clinical Social Work Assessment  Patient Details  Name: [name] MRN: [id number] Date of Birth: 1946-05-27  Date of referral:  06/21/15               Reason for consult:  Facility Placement, Discharge Planning                Permission sought to share information with:  Case Manager, Customer service manager, Family Supports Permission granted to share information::  Yes, Verbal Permission Granted  Name::      Haig Prophet and Marcia Brash)  Agency::   (SNF's)  Relationship::   (Daughter and Son)  Sport and exercise psychologist Information:   [phone])  Housing/Transportation Living arrangements for the past 2 months:  [place] of Information:  Adult Children, Patient Patient Interpreter Needed:  None Criminal Activity/Legal Involvement Pertinent to Current Situation/Hospitalization:  No - Comment as needed Significant Relationships:  Adult Children, Other Family Members Lives with:  Adult Children Do you feel safe going back to the place where you live?  Yes Need for family participation in patient care:  Yes (Comment)  Care giving concerns:  24/7 assistance.    Social Worker assessment / plan:  Holiday representative met with patient and pt's family present at bedside in reference to post-acute placement for SNF. CSW introduced CSW role and SNF process. CSW also reviewed and provided SNF list. Patient's son and dtr-in-law lives with her. Patient's daughter reported the only SNF their family is familiar with is [facility] due to having relatives there over the past several years. Patient reported she does NOT wish to be placed at [facility] for various reasons and would like to consider other SNF's first before making final decision. Pt's dtr asked several questions in regards to visitation and policies of SNF's and CSW advised family to visit desired SNF's for additional information. Pt's dtr further reported their family is not "big" on nursing homes and wishes for pt  to return home as soon as possible. CSW explained UHC-Medicare and Medicaid coverage for SNF as requested. Patient stated she is not "trilled" about going to SNF however realizes she will benefit from continued therapy and 24 hour assistance. Pt's dtr further stated pt's son, Wille Glaser is HCPOA and provided his contact information. CSW contacted pt's son who is also agreeable to SNF placement only if patient is. Pt's son further stated he does NOT patient placed at [facility] and will await bed offers. No further concerns reported by family at this time. CSW has completed FL-2 and faxed to SNF's in [facility]. CSW will continue to follow pt and pt's family for continued support and to facilitate pt's discharge needs once medically stable.  Employment status:  Retired Nurse, adult PT Recommendations:  [place] / Referral to community resources:  [place]  Patient/Family's Response to care:  Patient alert and oriented. Patient and family agreeable to SNF placement. Pt's family planning to visit desired facilities. Pt's family very involved and supportive of pt's care. Pt and family pleasant and apperciated social work intervention.  Patient/Family's Understanding of and Emotional Response to Diagnosis, Current Treatment, and Prognosis:  Patient and pt's family knowledgeable of PMH, current diagnosis and on-going treatment. Pt and family understands that SNF placement is for short-term rehab only. CSW remains available as needed.   Emotional Assessment Appearance:  Appears stated age Attitude/Demeanor/Rapport:   (Pleasant ) Affect (typically observed):  Adaptable, Pleasant, Appropriate Orientation:  Oriented to Self, Oriented  to Place, Oriented to  Time, Oriented to Situation Alcohol / Substance use:  Never Used Psych involvement (Current and /or in the community):  No (Comment)  Discharge Needs  Concerns to be addressed:   Cognitive Concerns, Home Safety Concerns Readmission within the last 30 days:  No Current discharge risk:  Cognitively Impaired, Dependent with Mobility Barriers to Discharge:  Barriers Resolved    , MSW, LCSWA [phone] 06/21/2015 9:44 PM  

## 2015-06-21 NOTE — Telephone Encounter (Signed)
Daughter advised that rx is ready for pick up

## 2015-06-21 NOTE — Telephone Encounter (Signed)
States patient has lost July script for hydrocodone and is requesting script for July and August.

## 2015-06-21 NOTE — Progress Notes (Signed)
SLP Cancellation Note  Patient Details Name: [name] MRN: [id number] DOB: [birth year]   Cancelled treatment:       Reason Eval/Treat Not Completed: SLP screened, no needs identified, will sign off - pt back to baseline level of function; cognition impaired at baseline with dementia of the Alzheimer's type.     Blenda Mounts Laurice 06/21/2015, 3:39 PM

## 2015-06-21 NOTE — Progress Notes (Signed)
  Echocardiogram 2D Echocardiogram has been performed.  [name] 06/21/2015, 11:30 AM

## 2015-06-21 NOTE — Telephone Encounter (Signed)
Please advise, thanks.

## 2015-06-22 DIAGNOSIS — G459 Transient cerebral ischemic attack, unspecified: Secondary | ICD-10-CM | POA: Diagnosis not present

## 2015-06-22 DIAGNOSIS — G458 Other transient cerebral ischemic attacks and related syndromes: Secondary | ICD-10-CM | POA: Diagnosis not present

## 2015-06-22 LAB — BASIC METABOLIC PANEL
Anion gap: 8 (ref 5–15)
BUN: 11 mg/dL (ref 6–20)
CHLORIDE: 105 mmol/L (ref 101–111)
CO2: 30 mmol/L (ref 22–32)
Calcium: 8.9 mg/dL (ref 8.9–10.3)
Creatinine, Ser: 0.95 mg/dL (ref 0.44–1.00)
GFR calc Af Amer: >60 mL/min (ref >60)
GFR calc non Af Amer: 59 mL/min — ABNORMAL LOW (ref >60)
GLUCOSE: 54 mg/dL — AB (ref 65–99)
Potassium: 3.5 mmol/L (ref 3.5–5.1)
SODIUM: 143 mmol/L (ref 135–145)

## 2015-06-22 LAB — GLUCOSE, CAPILLARY
GLUCOSE-CAPILLARY: 73 mg/dL (ref 65–99)
Glucose-Capillary: 53 mg/dL — ABNORMAL LOW (ref 65–99)
Glucose-Capillary: 143 mg/dL — ABNORMAL HIGH (ref 65–99)

## 2015-06-22 LAB — HEMOGLOBIN A1C
Hgb A1c MFr Bld: 6.6 % — ABNORMAL HIGH (ref 4.8–5.6)
Mean Plasma Glucose: 143 mg/dL

## 2015-06-22 MED ORDER — PRAVASTATIN SODIUM 40 MG PO TABS: 20.0000 mg | ORAL_TABLET | Freq: Every evening | ORAL | Status: DC

## 2015-06-22 MED ORDER — POTASSIUM CHLORIDE CRYS ER 20 MEQ PO TBCR: 40.0000 meq | EXTENDED_RELEASE_TABLET | Freq: Every day | ORAL | Status: DC

## 2015-06-22 MED ORDER — CLOPIDOGREL BISULFATE 75 MG PO TABS: 75.0000 mg | ORAL_TABLET | Freq: Every day | ORAL | Status: DC

## 2015-06-22 NOTE — Discharge Summary (Signed)
Physician Discharge Summary  [name] ZOX:[id number] DOB: 1946-05-29 DOA: 06/20/2015  PCP: Sonda Primes, MD  Admit date: 06/20/2015 Discharge date: 06/22/2015  Recommendations for Outpatient Follow-up:  1. SNF recommended. Family prefers home with PT, OT, which has been arranged.  Discharge Diagnoses:  Principal Problem:   TIA (transient ischemic attack) Active Problems:   B12 deficiency   Essential hypertension   Asthma with COPD   Dementia of the Alzheimer's type, with early onset, with delusions   Hypokalemia   ARF (acute renal failure)   Diabetes mellitus type 2, controlled   Hyperlipidemia   Discharge Condition: stable  Diet recommendation: diabetic. Heart healthy  Filed Weights   06/20/15 2038  Weight: 77.8 kg (171 lb 8.3 oz)    History of present illness:  69 y.o. female with history of hypertension, diabetes mellitus, B-12 deficiency and dementia was brought to the ER after patient was found to have difficulty speaking around 5 PM and patient also was complaining of right facial and right upper extremity numbness. Patient's symptoms lasted for half hour following which symptoms completely resolve. On exam patient was nonfocal in the ER and CT of the head do not show anything acute. Patient otherwise denies any visual symptoms difficulty swallowing. Patient has been admitted for further management of TIA. Patient denies any chest pain or shortness of breath nausea vomiting abdominal pain. Patient was found to be hypokalemic.   Hospital Course:    TIA (transient ischemic attack): MRI negative. Echocardiogram without source of embolus. carotid Dopplers without significant stenosis. LDL 165.  hgb a1c 6.6. Patient's chart reports an intolerance to statins. Physical therapy and occupational therapy recommending skilled nursing facility. Family prefers home with home health. Reports allergy to aspirin. Started on plavix. Active Problems:  Systolic dysfunction without  evidence of CHF: ef on echo 40-45%.  No documented h/o CHF, and no evidence of acute CHF.  Already on lasix and ARB, so continued same   Essential hypertension remained stable   Asthma with COPD remained without wheeze   Dementia of the Alzheimer's type,    Hypokalemia: corrected   ARF (acute renal failure) improving. Creat 1.2 on admission. At discharge, 0.9   Diabetes mellitus type 2, controlled   Hyperlipidemia: intolerant to statin  Procedures:  none  Consultations:  none  Discharge Exam: Filed Vitals:   06/22/15 0554  BP: 146/60  Pulse: 60  Temp: 97.8 F (36.6 C)  Resp: 18    General: a and o Cardiovascular: RRR Respiratory: CTA Neuro: nonfocal  Discharge Instructions   Discharge Instructions    Diet - low sodium heart healthy    Complete by:  As directed      Diet Carb Modified    Complete by:  As directed      Walk with assistance    Complete by:  As directed      Walker     Complete by:  As directed           Current Discharge Medication List    START taking these medications   Details  clopidogrel (PLAVIX) 75 MG tablet Take 1 tablet (75 mg total) by mouth daily. Qty: 30 tablet, Refills: 1    potassium chloride SA (K-DUR,KLOR-CON) 20 MEQ tablet Take 2 tablets (40 mEq total) by mouth daily. Qty: 14 tablet, Refills: 0      CONTINUE these medications which have CHANGED   Details  pravastatin (PRAVACHOL) 40 MG tablet Take 0.5 tablets (20 mg total) by mouth every  evening. Qty: 30 tablet, Refills: 1      CONTINUE these medications which have NOT CHANGED   Details  acetaminophen (TYLENOL) 500 MG tablet Take 500 mg by mouth every 6 (six) hours as needed.    albuterol (PROVENTIL HFA;VENTOLIN HFA) 108 (90 BASE) MCG/ACT inhaler Inhale 2 puffs into the lungs every 6 (six) hours as needed for wheezing or shortness of breath.    azelastine (ASTELIN) 0.1 % nasal spray INSERT 1 TO 2 SPRAYS INTO EACH NOSTRIL ONCE OR TWICE DAILY AS  NEEDED Qty: 30 mL, Refills: 0    BIOTIN PO Take 1 capsule by mouth daily.    clonazePAM (KLONOPIN) 1 MG tablet Take 1-2 tablets (1-2 mg total) by mouth at bedtime as needed for anxiety. Qty: 60 tablet, Refills: 2    donepezil (ARICEPT) 10 MG tablet TAKE 1 TABLET BY MOUTH EVERY NIGHT AT BEDTIME Qty: 90 tablet, Refills: 1    fish oil-omega-3 fatty acids 1000 MG capsule Take 2 g by mouth 2 (two) times daily.     furosemide (LASIX) 80 MG tablet Take 1 tablet (80 mg total) by mouth daily. Qty: 30 tablet, Refills: 11    insulin glargine (LANTUS) 100 UNIT/ML injection Inject 0.4 mLs (40 Units total) into the skin daily. Qty: 10 mL, Refills: 11    levalbuterol (XOPENEX) 0.63 MG/3ML nebulizer solution Take 1 ampule by nebulization every 6 (six) hours as needed. For wheezing    levothyroxine (SYNTHROID, LEVOTHROID) 88 MCG tablet TAKE 1 TABLET BY MOUTH EVERY DAY Qty: 90 tablet, Refills: 3    metFORMIN (GLUCOPHAGE) 1000 MG tablet TAKE 1 TABLET BY MOUTH TWICE DAILY WITH A MEAL Qty: 60 tablet, Refills: 11    MICRONIZED COLESTIPOL HCL 1 G tablet TAKE 1 TABLET(1 GRAM) BY MOUTH TWICE DAILY Qty: 180 tablet, Refills: 0    Multiple Vitamin (MULTIVITAMIN WITH MINERALS) TABS tablet Take 1 tablet by mouth daily.    NAMENDA XR 28 MG CP24 24 hr capsule TAKE ONE CAPSULE BY MOUTH EVERY DAY Qty: 90 capsule, Refills: 1    saccharomyces boulardii (FLORASTOR) 250 MG capsule Take 1 capsule (250 mg total) by mouth 2 (two) times daily. Qty: 60 capsule, Refills: 0    SYMBICORT 80-4.5 MCG/ACT inhaler INHALE 2 PUFFS BY MOUTH TWICE DAILY Qty: 10.2 g, Refills: 3    tiotropium (SPIRIVA) 18 MCG inhalation capsule Place 18 mcg into inhaler and inhale daily.    valsartan (DIOVAN) 160 MG tablet TAKE 1 TABLET BY MOUTH DAILY Qty: 30 tablet, Refills: 11    HYDROcodone-acetaminophen (NORCO/VICODIN) 5-325 MG per tablet Take 1 tablet by mouth every 6 (six) hours as needed for moderate pain. Please fill on or after  06/16/2015 Qty: 120 tablet, Refills: 0      STOP taking these medications     cyanocobalamin (,VITAMIN B-12,) 1000 MCG/ML injection      esomeprazole (NEXIUM) 40 MG capsule      ketoconazole (NIZORAL) 2 % cream        Allergies  Allergen Reactions  . Gabapentin Other (See Comments)    Rxed in ER; not tolerated  . Lipitor [Atorvastatin] Other (See Comments)    Urticaria   . Zocor [Simvastatin] Other (See Comments)    Urticaria   . Aspirin Itching  . Codeine Itching  . Demerol Itching    Unknown-unconscious.  . Latex Itching  . Lisinopril Itching and Cough   Follow-up Information    Follow up with Sonda Primes, MD In 1 week.   Specialty:  Internal Medicine   Contact information:   [address] [place] [address] [phone]        The results of significant diagnostics from this hospitalization (including imaging, microbiology, ancillary and laboratory) are listed below for reference.    Significant Diagnostic Studies: Dg Chest 2 View  06/21/2015   CLINICAL DATA:  69 year old female with transient ischemic attack.  EXAM: CHEST  2 VIEW  COMPARISON:  Radiograph dated 03/22/2015  FINDINGS: Two views of the chest demonstrate emphysematous changes of the lungs. There is no focal consolidation, pleural effusion, or pneumothorax. The cardiomediastinal silhouette is within normal limits. Osteopenia with degenerative changes of the spine. No acute fracture.  IMPRESSION: No active cardiopulmonary disease.   Electronically Signed   By: Elgie Collard M.D.   On: 06/21/2015 03:15   Ct Head Wo Contrast  06/20/2015   CLINICAL DATA:  Dysarthria  EXAM: CT HEAD WITHOUT CONTRAST  TECHNIQUE: Contiguous axial images were obtained from the base of the skull through the vertex without intravenous contrast.  COMPARISON:  05/12/2012  FINDINGS: No skull fracture is noted. Paranasal sinuses and mastoid air cells are unremarkable.  No intracranial hemorrhage, mass effect or midline shift.  Stable atrophy and chronic white matter disease. No definite acute cortical infarction. No mass lesion is noted on this unenhanced scan.  IMPRESSION: No acute intracranial abnormality. Stable atrophy and chronic white matter disease.   Electronically Signed   By: Natasha Mead M.D.   On: 06/20/2015 18:10   Mr Brain Wo Contrast  06/21/2015   CLINICAL DATA:  69 year old female with episode of abnormal speech and right side weakness. Symptoms resolved after about a 0.5 hour. TIA. Initial encounter.  EXAM: MRI HEAD WITHOUT CONTRAST  MRA HEAD WITHOUT CONTRAST  TECHNIQUE: Multiplanar, multiecho pulse sequences of the brain and surrounding structures were obtained without intravenous contrast. Angiographic images of the head were obtained using MRA technique without contrast.  COMPARISON:  Head CT without contrast 06/20/2015. Brain MRI 02/13/2015, and earlier.  FINDINGS: MRI HEAD FINDINGS  Major intracranial vascular flow voids are stable. No restricted diffusion or evidence of acute infarction.  Stable cerebral volume. No midline shift, mass effect, evidence of mass lesion, ventriculomegaly, extra-axial collection or acute intracranial hemorrhage. Cervicomedullary junction and pituitary are within normal limits. Stable mild for age nonspecific cerebral white matter T2 and FLAIR hyperintensity, mostly periventricular. Deep gray matter nuclei, brainstem, and cerebellum remain within normal limits.  Visible internal auditory structures appear normal. Stable mild right mastoid effusion. Negative nasopharynx. Negative paranasal sinuses. Stable orbit soft tissues. Negative scalp soft tissues. Normal bone marrow signal. Negative visualized cervical spine.  MRA HEAD FINDINGS  Mildly degraded by motion despite repeated imaging attempts. Antegrade flow in the posterior circulation with codominant distal vertebral arteries. Normal PICA origins. Patent vertebrobasilar junction. No basilar artery stenosis. SCA and PCA origins are  normal. Posterior communicating arteries are diminutive or absent. Bilateral PCA branches are within normal limits.  Antegrade flow in both ICA siphons. Calcified siphon atherosclerosis is evident. Up to mild bilateral cavernous segment siphon stenosis. Normal ophthalmic artery origins.  Normal carotid termini, MCA and ACA origins. Anterior communicating artery and visualized ACA branches are within normal limits. Bilateral MCA M1 segments are within normal limits. Both MCA bifurcations are patent. Visualized bilateral MCA branches are within normal limits.  IMPRESSION: 1. No acute intracranial abnormality. Stable mild nonspecific cerebral white matter signal changes since March. 2. 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 Otherwise negative intracranial MRA.  Electronically Signed   By: Odessa Fleming M.D.   On: 06/21/2015 10:15   Mr Maxine Glenn Head/brain Wo Cm  06/21/2015   CLINICAL DATA:  69 year old female with episode of abnormal speech and right side weakness. Symptoms resolved after about a 0.5 hour. TIA. Initial encounter.  EXAM: MRI HEAD WITHOUT CONTRAST  MRA HEAD WITHOUT CONTRAST  TECHNIQUE: Multiplanar, multiecho pulse sequences of the brain and surrounding structures were obtained without intravenous contrast. Angiographic images of the head were obtained using MRA technique without contrast.  COMPARISON:  Head CT without contrast 06/20/2015. Brain MRI 02/13/2015, and earlier.  FINDINGS: MRI HEAD FINDINGS  Major intracranial vascular flow voids are stable. No restricted diffusion or evidence of acute infarction.  Stable cerebral volume. No midline shift, mass effect, evidence of mass lesion, ventriculomegaly, extra-axial collection or acute intracranial hemorrhage. Cervicomedullary junction and pituitary are within normal limits. Stable mild for age nonspecific cerebral white matter T2 and FLAIR hyperintensity, mostly periventricular. Deep gray matter nuclei, brainstem, and cerebellum remain  within normal limits.  Visible internal auditory structures appear normal. Stable mild right mastoid effusion. Negative nasopharynx. Negative paranasal sinuses. Stable orbit soft tissues. Negative scalp soft tissues. Normal bone marrow signal. Negative visualized cervical spine.  MRA HEAD FINDINGS  Mildly degraded by motion despite repeated imaging attempts. Antegrade flow in the posterior circulation with codominant distal vertebral arteries. Normal PICA origins. Patent vertebrobasilar junction. No basilar artery stenosis. SCA and PCA origins are normal. Posterior communicating arteries are diminutive or absent. Bilateral PCA branches are within normal limits.  Antegrade flow in both ICA siphons. Calcified siphon atherosclerosis is evident. Up to mild bilateral cavernous segment siphon stenosis. Normal ophthalmic artery origins.  Normal carotid termini, MCA and ACA origins. Anterior communicating artery and visualized ACA branches are within normal limits. Bilateral MCA M1 segments are within normal limits. Both MCA bifurcations are patent. Visualized bilateral MCA branches are within normal limits.  IMPRESSION: 1. No acute intracranial abnormality. Stable mild nonspecific cerebral white matter signal changes since March. 2. 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 Otherwise negative intracranial MRA.   Electronically Signed   By: Odessa Fleming M.D.   On: 06/21/2015 10:15   Carotid doppler The vertebral arteries appear patent with antegrade flow. - Findings consistent with 1-39 percent stenosis involving the right internal carotid artery and the left internal carotid artery.  Echo Left ventricle: The cavity size was normal. There was moderate concentric hypertrophy. Systolic function was mildly to moderately reduced. The estimated ejection fraction was in the range of 40% to 45%. Diffuse hypokinesis. Doppler parameters are consistent with abnormal left ventricular relaxation  (grade 1 diastolic dysfunction). Doppler parameters are consistent with elevated ventricular end-diastolic filling pressure. - Aortic valve: Trileaflet; mildly thickened, mildly calcified leaflets. There was trivial regurgitation. Mean gradient (S): 5 mm Hg. Peak gradient (S): 9 mm Hg. Valve area (VTI): 2.65 cm^2. Valve area (Vmax): 2.27 cm^2. Valve area (Vmean): 1.97 cm^2. - Aortic root: The aortic root was normal in size. - Ascending aorta: The ascending aorta was normal in size. - Mitral valve: Structurally normal valve. Transvalvular velocity was within the normal range. There was no evidence for stenosis. There was no regurgitation. Valve area by continuity equation (using LVOT flow): 1.58 cm^2. - Right ventricle: The cavity size was normal. Wall thickness was normal. Systolic function was normal. - Right atrium: The atrium was normal in size. - Tricuspid valve: There was trivial regurgitation. - Pulmonic valve: There was no regurgitation. - Pulmonary arteries:  Systolic pressure was within the normal range. - Inferior vena cava: The vessel was normal in size. - Pericardium, extracardiac: A trivial pericardial effusion was identified posterior to the heart. Features were not consistent with tamponade physiology. Microbiology: No results found for this or any previous visit (from the past 240 hour(s)).   Labs: Basic Metabolic Panel:  Recent Labs Lab 06/20/15 1805 06/21/15 0323 06/22/15 0417  NA 141 140 143  K 2.3* 3.0* 3.5  CL 96* 101 105  CO2 32 31 30  GLUCOSE 93 134* 54*  BUN 14 11 11   CREATININE 1.23* 1.09* 0.95  CALCIUM 9.2 8.3* 8.9  MG  --  1.0*  --    Liver Function Tests:  Recent Labs Lab 06/21/15 0323  AST 21  ALT 13*  ALKPHOS 55  BILITOT 1.4*  PROT 5.6*  ALBUMIN 3.0*   No results for input(s): LIPASE, AMYLASE in the last 168 hours. No results for input(s): AMMONIA in the last 168 hours. CBC:  Recent Labs Lab  06/20/15 1805  WBC 7.3  HGB 13.3  HCT 39.0  MCV 87.6  PLT 222   Cardiac Enzymes: No results for input(s): CKTOTAL, CKMB, CKMBINDEX, TROPONINI in the last 168 hours. BNP: BNP (last 3 results) No results for input(s): BNP in the last 8760 hours.  ProBNP (last 3 results) No results for input(s): PROBNP in the last 8760 hours.  CBG:  Recent Labs Lab 06/21/15 1654 06/21/15 1805 06/21/15 2222 06/22/15 0635 06/22/15 0652  GLUCAP 54* 135* 91 53* 73       Signed:  Oaklyn Jakubek L  [facility] 06/22/2015, 9:50 AM

## 2015-06-22 NOTE — Progress Notes (Addendum)
Checked pt CBG this morning it was 53; gave 4oz of OJ and it came up to 73.  Paging on call doctor to let them know of dip.

## 2015-06-22 NOTE — Progress Notes (Signed)
Hypoglycemic Event  CBG: 53 6:35  Treatment: 4 oz OJ  Symptoms: shakey  Follow-up CBG: Time:6:52 CBG Result:73  Possible Reasons for Event:medication  Comments/MD notified: VIa amnion [facility]    Eder Macek L  Remember to initiate Hypoglycemia Order Set & complete

## 2015-06-22 NOTE — Care Management Note (Addendum)
Case Management Note  Patient Details  Name: [name] MRN: [id number] Date of Birth: 1946/11/19  Subjective/Objective:                  Difficulty speaking and right-sided numbness/ fd to have TIA  Action/Plan:  discharge home with HHPT/ HHOT/ [facility]  Expected Discharge Date:       06/22/15           Expected Discharge Plan:  Home w Home Health Services  In-House Referral:     Discharge planning Services  CM Consult  Post Acute Care Choice:    Choice offered to:  Adult Children  DME Arranged:    rolling walker DME Agency:   [facility]  HH Arranged:  RN, PT, OT HH Agency:  [facility]  Status of Service: completed and signed off Medicare Important Message Given:    Date Medicare IM Given:    Medicare IM give by:    Date Additional Medicare IM Given:    Additional Medicare Important Message give by:     If discussed at Long Length of Stay Meetings, dates discussed:    Additional Comments:  Spoke to patient and daughter provided list of HH agencies, daughter contacted family and friends who suggested multiple agencies (5), issues with set up ranged from [facility]) not providing needed services to ([facility]) not returning call to verify if the services could be provided to , [name]) having delays to start therapy 3-5 days after discharge or need to verify insurance pending on Monday before care could actually be confirmed [name]) .  Patient's walker delivered and she became frustrated about not being able to go home.  Daughter decided to go with [facility] even though start of therapy/service may not be possible until July 29.  Nurse [name] made aware to inform MD of delay of services.  Re-called [facility] and set patient up for HHRN/ HHOT/ HHPT, also informed [name] rep that family was requesting an [name] to work with patient if possible.  [name] did not recognize names stated they would try to  arrange if possible.  [name] stated that they would start services earlier if they were able to provide therapists and and a nurse.  Information faxed to [facility]. With receipt of information noted.   [name], RN 06/22/2015, 3:29 PM

## 2015-06-22 NOTE — Progress Notes (Signed)
Pt d/c to home by car with family. Assessment stable. Prescriptions given. All questions answered. 

## 2015-06-24 ENCOUNTER — Telehealth: Payer: Self-pay

## 2015-06-24 NOTE — Telephone Encounter (Signed)
Pt is on TCM list. No DC summary for pt. DX for admission was Hypokalemia, Transient cerebral ischemia, unspecified transient cerebral ischemia type.

## 2015-06-25 DIAGNOSIS — R2689 Other abnormalities of gait and mobility: Secondary | ICD-10-CM | POA: Diagnosis not present

## 2015-06-25 DIAGNOSIS — J449 Chronic obstructive pulmonary disease, unspecified: Secondary | ICD-10-CM | POA: Diagnosis not present

## 2015-06-25 DIAGNOSIS — M6281 Muscle weakness (generalized): Secondary | ICD-10-CM | POA: Diagnosis not present

## 2015-06-25 DIAGNOSIS — G459 Transient cerebral ischemic attack, unspecified: Secondary | ICD-10-CM | POA: Diagnosis not present

## 2015-06-25 DIAGNOSIS — E119 Type 2 diabetes mellitus without complications: Secondary | ICD-10-CM | POA: Diagnosis not present

## 2015-06-26 ENCOUNTER — Telehealth: Payer: Self-pay | Admitting: Internal Medicine

## 2015-06-26 NOTE — Telephone Encounter (Signed)
Ok Thx 

## 2015-06-26 NOTE — Telephone Encounter (Signed)
Lyla Son from [facility] [phone]) needing verbal authorization to continue physical therapy for high level balancing and strengthening. 2x for 3 wks and 1 x 1 wk

## 2015-06-27 DIAGNOSIS — R2689 Other abnormalities of gait and mobility: Secondary | ICD-10-CM | POA: Diagnosis not present

## 2015-06-27 DIAGNOSIS — J449 Chronic obstructive pulmonary disease, unspecified: Secondary | ICD-10-CM | POA: Diagnosis not present

## 2015-06-27 DIAGNOSIS — G459 Transient cerebral ischemic attack, unspecified: Secondary | ICD-10-CM | POA: Diagnosis not present

## 2015-06-27 DIAGNOSIS — M6281 Muscle weakness (generalized): Secondary | ICD-10-CM | POA: Diagnosis not present

## 2015-06-27 DIAGNOSIS — E119 Type 2 diabetes mellitus without complications: Secondary | ICD-10-CM | POA: Diagnosis not present

## 2015-06-27 LAB — GLUCOSE, CAPILLARY
GLUCOSE-CAPILLARY: 116 mg/dL — AB (ref 65–99)
Glucose-Capillary: 213 mg/dL — ABNORMAL HIGH (ref 65–99)
Glucose-Capillary: 120 mg/dL — ABNORMAL HIGH (ref 65–99)

## 2015-06-27 NOTE — Telephone Encounter (Signed)
Left message advising carrie of dr plotnikovs note

## 2015-06-28 ENCOUNTER — Telehealth: Payer: Self-pay | Admitting: Internal Medicine

## 2015-06-28 DIAGNOSIS — R2689 Other abnormalities of gait and mobility: Secondary | ICD-10-CM | POA: Diagnosis not present

## 2015-06-28 DIAGNOSIS — G459 Transient cerebral ischemic attack, unspecified: Secondary | ICD-10-CM | POA: Diagnosis not present

## 2015-06-28 DIAGNOSIS — J449 Chronic obstructive pulmonary disease, unspecified: Secondary | ICD-10-CM | POA: Diagnosis not present

## 2015-06-28 DIAGNOSIS — E119 Type 2 diabetes mellitus without complications: Secondary | ICD-10-CM | POA: Diagnosis not present

## 2015-06-28 DIAGNOSIS — M6281 Muscle weakness (generalized): Secondary | ICD-10-CM | POA: Diagnosis not present

## 2015-06-28 NOTE — Telephone Encounter (Signed)
Please advise, thanks.

## 2015-06-28 NOTE — Telephone Encounter (Signed)
[name] is calling to advise no availability this week for OT eval. He is requesting a verbal to evaluate next week.

## 2015-06-29 NOTE — Telephone Encounter (Signed)
Ok Thx 

## 2015-07-01 NOTE — Telephone Encounter (Signed)
Advised don/[facility] that dr plotnikov gave verbal ok for ot eval this week

## 2015-07-02 DIAGNOSIS — J449 Chronic obstructive pulmonary disease, unspecified: Secondary | ICD-10-CM | POA: Diagnosis not present

## 2015-07-02 DIAGNOSIS — G459 Transient cerebral ischemic attack, unspecified: Secondary | ICD-10-CM | POA: Diagnosis not present

## 2015-07-02 DIAGNOSIS — R2689 Other abnormalities of gait and mobility: Secondary | ICD-10-CM | POA: Diagnosis not present

## 2015-07-02 DIAGNOSIS — E119 Type 2 diabetes mellitus without complications: Secondary | ICD-10-CM | POA: Diagnosis not present

## 2015-07-02 DIAGNOSIS — M6281 Muscle weakness (generalized): Secondary | ICD-10-CM | POA: Diagnosis not present

## 2015-07-03 DIAGNOSIS — R2689 Other abnormalities of gait and mobility: Secondary | ICD-10-CM | POA: Diagnosis not present

## 2015-07-03 DIAGNOSIS — E119 Type 2 diabetes mellitus without complications: Secondary | ICD-10-CM | POA: Diagnosis not present

## 2015-07-03 DIAGNOSIS — G459 Transient cerebral ischemic attack, unspecified: Secondary | ICD-10-CM | POA: Diagnosis not present

## 2015-07-03 DIAGNOSIS — M6281 Muscle weakness (generalized): Secondary | ICD-10-CM | POA: Diagnosis not present

## 2015-07-03 DIAGNOSIS — J449 Chronic obstructive pulmonary disease, unspecified: Secondary | ICD-10-CM | POA: Diagnosis not present

## 2015-07-04 DIAGNOSIS — H5711 Ocular pain, right eye: Secondary | ICD-10-CM | POA: Diagnosis not present

## 2015-07-05 DIAGNOSIS — M6281 Muscle weakness (generalized): Secondary | ICD-10-CM | POA: Diagnosis not present

## 2015-07-05 DIAGNOSIS — E119 Type 2 diabetes mellitus without complications: Secondary | ICD-10-CM | POA: Diagnosis not present

## 2015-07-05 DIAGNOSIS — R2689 Other abnormalities of gait and mobility: Secondary | ICD-10-CM | POA: Diagnosis not present

## 2015-07-05 DIAGNOSIS — G459 Transient cerebral ischemic attack, unspecified: Secondary | ICD-10-CM | POA: Diagnosis not present

## 2015-07-05 DIAGNOSIS — J449 Chronic obstructive pulmonary disease, unspecified: Secondary | ICD-10-CM | POA: Diagnosis not present

## 2015-07-08 DIAGNOSIS — J449 Chronic obstructive pulmonary disease, unspecified: Secondary | ICD-10-CM | POA: Diagnosis not present

## 2015-07-08 DIAGNOSIS — R2689 Other abnormalities of gait and mobility: Secondary | ICD-10-CM | POA: Diagnosis not present

## 2015-07-08 DIAGNOSIS — G459 Transient cerebral ischemic attack, unspecified: Secondary | ICD-10-CM | POA: Diagnosis not present

## 2015-07-08 DIAGNOSIS — E119 Type 2 diabetes mellitus without complications: Secondary | ICD-10-CM | POA: Diagnosis not present

## 2015-07-08 DIAGNOSIS — M6281 Muscle weakness (generalized): Secondary | ICD-10-CM | POA: Diagnosis not present

## 2015-07-09 DIAGNOSIS — J449 Chronic obstructive pulmonary disease, unspecified: Secondary | ICD-10-CM | POA: Diagnosis not present

## 2015-07-09 DIAGNOSIS — G459 Transient cerebral ischemic attack, unspecified: Secondary | ICD-10-CM | POA: Diagnosis not present

## 2015-07-09 DIAGNOSIS — E119 Type 2 diabetes mellitus without complications: Secondary | ICD-10-CM | POA: Diagnosis not present

## 2015-07-09 DIAGNOSIS — M6281 Muscle weakness (generalized): Secondary | ICD-10-CM | POA: Diagnosis not present

## 2015-07-09 DIAGNOSIS — R2689 Other abnormalities of gait and mobility: Secondary | ICD-10-CM | POA: Diagnosis not present

## 2015-07-10 DIAGNOSIS — G459 Transient cerebral ischemic attack, unspecified: Secondary | ICD-10-CM | POA: Diagnosis not present

## 2015-07-10 DIAGNOSIS — J449 Chronic obstructive pulmonary disease, unspecified: Secondary | ICD-10-CM | POA: Diagnosis not present

## 2015-07-10 DIAGNOSIS — E119 Type 2 diabetes mellitus without complications: Secondary | ICD-10-CM | POA: Diagnosis not present

## 2015-07-10 DIAGNOSIS — R2689 Other abnormalities of gait and mobility: Secondary | ICD-10-CM | POA: Diagnosis not present

## 2015-07-10 DIAGNOSIS — M6281 Muscle weakness (generalized): Secondary | ICD-10-CM | POA: Diagnosis not present

## 2015-07-11 ENCOUNTER — Encounter: Payer: Self-pay | Admitting: Gastroenterology

## 2015-07-11 ENCOUNTER — Ambulatory Visit (INDEPENDENT_AMBULATORY_CARE_PROVIDER_SITE_OTHER): Payer: Medicare Other | Admitting: Gastroenterology

## 2015-07-11 VITALS — BP 112/60 | HR 76 | Ht 63.0 in | Wt 180.4 lb

## 2015-07-11 DIAGNOSIS — R1032 Left lower quadrant pain: Secondary | ICD-10-CM | POA: Insufficient documentation

## 2015-07-11 DIAGNOSIS — K3184 Gastroparesis: Secondary | ICD-10-CM | POA: Diagnosis not present

## 2015-07-11 DIAGNOSIS — R197 Diarrhea, unspecified: Secondary | ICD-10-CM

## 2015-07-11 MED ORDER — HYOSCYAMINE SULFATE 0.125 MG SL SUBL: 0.2500 mg | SUBLINGUAL_TABLET | SUBLINGUAL | Status: DC | PRN

## 2015-07-11 NOTE — Assessment & Plan Note (Signed)
Patient will be considered for a gastroparesis trial

## 2015-07-11 NOTE — Assessment & Plan Note (Signed)
Patient is minimally symptomatic.  Plan to continue Imodium when necessary

## 2015-07-11 NOTE — Patient Instructions (Signed)
Research will be speaking with you today 

## 2015-07-11 NOTE — Progress Notes (Signed)
      History of Present Illness:  [name] has occasional loose stools.  She complains of intermittent left and right lower quadrant discomfort.  This has no specific pattern.  She has well-established gastroparesis and does complain of distention and occasional nausea.    Review of Systems: Pertinent positive and negative review of systems were noted in the above HPI section. All other review of systems were otherwise negative.    Current Medications, Allergies, Past Medical History, Past Surgical History, Family History and Social History were reviewed in [employer] electronic medical record  Vital signs were reviewed in today's medical record. Physical Exam: General: Elderly female in no acute distress   See Assessment and Plan under Problem List

## 2015-07-11 NOTE — Assessment & Plan Note (Signed)
Patient has very nonspecific left and right lower quadrant discomfort.  Doubt serious underlying GI pathology.  Recommendations #1 hyomax sublingual when necessary

## 2015-07-17 ENCOUNTER — Telehealth: Payer: Self-pay | Admitting: Gastroenterology

## 2015-07-17 DIAGNOSIS — R2689 Other abnormalities of gait and mobility: Secondary | ICD-10-CM | POA: Diagnosis not present

## 2015-07-17 DIAGNOSIS — J449 Chronic obstructive pulmonary disease, unspecified: Secondary | ICD-10-CM | POA: Diagnosis not present

## 2015-07-17 DIAGNOSIS — M6281 Muscle weakness (generalized): Secondary | ICD-10-CM | POA: Diagnosis not present

## 2015-07-17 DIAGNOSIS — G459 Transient cerebral ischemic attack, unspecified: Secondary | ICD-10-CM | POA: Diagnosis not present

## 2015-07-17 DIAGNOSIS — E119 Type 2 diabetes mellitus without complications: Secondary | ICD-10-CM | POA: Diagnosis not present

## 2015-07-17 MED ORDER — GLYCOPYRROLATE 2 MG PO TABS: 2.0000 mg | ORAL_TABLET | Freq: Two times a day (BID) | ORAL | Status: DC

## 2015-07-17 NOTE — Telephone Encounter (Signed)
New med sent 

## 2015-07-17 NOTE — Telephone Encounter (Signed)
Dr Arlyce Dice, Insurance will not pay for hyomax, what do you want to send   Robinul??

## 2015-07-17 NOTE — Telephone Encounter (Signed)
Robinul Forte 2 mg 2-3 times a day as needed

## 2015-07-18 DIAGNOSIS — R2689 Other abnormalities of gait and mobility: Secondary | ICD-10-CM | POA: Diagnosis not present

## 2015-07-18 DIAGNOSIS — E119 Type 2 diabetes mellitus without complications: Secondary | ICD-10-CM | POA: Diagnosis not present

## 2015-07-18 DIAGNOSIS — G459 Transient cerebral ischemic attack, unspecified: Secondary | ICD-10-CM | POA: Diagnosis not present

## 2015-07-18 DIAGNOSIS — J449 Chronic obstructive pulmonary disease, unspecified: Secondary | ICD-10-CM | POA: Diagnosis not present

## 2015-07-18 DIAGNOSIS — M6281 Muscle weakness (generalized): Secondary | ICD-10-CM | POA: Diagnosis not present

## 2015-07-19 DIAGNOSIS — J449 Chronic obstructive pulmonary disease, unspecified: Secondary | ICD-10-CM | POA: Diagnosis not present

## 2015-07-19 DIAGNOSIS — G459 Transient cerebral ischemic attack, unspecified: Secondary | ICD-10-CM | POA: Diagnosis not present

## 2015-07-19 DIAGNOSIS — R2689 Other abnormalities of gait and mobility: Secondary | ICD-10-CM | POA: Diagnosis not present

## 2015-07-19 DIAGNOSIS — E119 Type 2 diabetes mellitus without complications: Secondary | ICD-10-CM

## 2015-07-19 DIAGNOSIS — M6281 Muscle weakness (generalized): Secondary | ICD-10-CM | POA: Diagnosis not present

## 2015-07-23 ENCOUNTER — Encounter: Payer: Self-pay | Admitting: Internal Medicine

## 2015-07-25 DIAGNOSIS — M6281 Muscle weakness (generalized): Secondary | ICD-10-CM | POA: Diagnosis not present

## 2015-07-25 DIAGNOSIS — G459 Transient cerebral ischemic attack, unspecified: Secondary | ICD-10-CM | POA: Diagnosis not present

## 2015-07-25 DIAGNOSIS — E119 Type 2 diabetes mellitus without complications: Secondary | ICD-10-CM | POA: Diagnosis not present

## 2015-07-25 DIAGNOSIS — J449 Chronic obstructive pulmonary disease, unspecified: Secondary | ICD-10-CM | POA: Diagnosis not present

## 2015-07-25 DIAGNOSIS — R2689 Other abnormalities of gait and mobility: Secondary | ICD-10-CM | POA: Diagnosis not present

## 2015-07-25 NOTE — Telephone Encounter (Signed)
Error

## 2015-07-29 ENCOUNTER — Telehealth: Payer: Self-pay | Admitting: Internal Medicine

## 2015-07-29 NOTE — Telephone Encounter (Signed)
Patient need a refill of Hydrocodone 325 mg [facility] on [address]

## 2015-07-29 NOTE — Telephone Encounter (Signed)
OK to fill this prescription with additional refills x0 Thank you!  

## 2015-07-30 ENCOUNTER — Other Ambulatory Visit: Payer: Self-pay | Admitting: Internal Medicine

## 2015-07-30 MED ORDER — HYDROCODONE-ACETAMINOPHEN 5-325 MG PO TABS: 1.0000 | ORAL_TABLET | Freq: Four times a day (QID) | ORAL | Status: DC | PRN

## 2015-07-30 NOTE — Telephone Encounter (Signed)
Patient called back regarding prescription.  Please call when ready

## 2015-07-30 NOTE — Telephone Encounter (Signed)
rx printed. Ready for p/u. Pt's daughter-in-law informed.

## 2015-07-31 ENCOUNTER — Telehealth: Payer: Self-pay | Admitting: Internal Medicine

## 2015-07-31 MED ORDER — AMOXICILLIN-POT CLAVULANATE 875-125 MG PO TABS: 1.0000 | ORAL_TABLET | Freq: Two times a day (BID) | ORAL | Status: DC

## 2015-07-31 NOTE — Telephone Encounter (Signed)
Called and spoke with daughter in law [place].  Informed that CY would like pt to start an antiobiotic for the next 7 days.  Ignacia Bayley voiced understanding of instructions Pt's pharmacy verified Medication sent to pharmacy  Nothing further is needed

## 2015-07-31 NOTE — Telephone Encounter (Signed)
Offer augmentin 875   # 14, 1 twice daily 

## 2015-07-31 NOTE — Telephone Encounter (Signed)
Spoke with the pt's daughter in law Martie Lee  She states that the pt thinks she has a sinus infection  She is c/o sinus pressure, HA, yellow nasal d/c and prod cough with yellow sputum  She denies any fever, SOB or other co's  She was last seen 05/10/14 and has ov pending 11/14/15   Current Outpatient Prescriptions on File Prior to Visit  Medication Sig Dispense Refill  . ACCU-CHEK AVIVA PLUS test strip USE TO TEST BLOOD SUGAR 3-4 TIMES DAILY 100 each 3  . acetaminophen (TYLENOL) 500 MG tablet Take 500 mg by mouth every 6 (six) hours as needed.    Marland Kitchen albuterol (PROVENTIL HFA;VENTOLIN HFA) 108 (90 BASE) MCG/ACT inhaler Inhale 2 puffs into the lungs every 6 (six) hours as needed for wheezing or shortness of breath.    Marland Kitchen azelastine (ASTELIN) 0.1 % nasal spray INSERT 1 TO 2 SPRAYS INTO EACH NOSTRIL ONCE OR TWICE DAILY AS NEEDED 30 mL 0  . BIOTIN PO Take 1 capsule by mouth daily.    . clonazePAM (KLONOPIN) 1 MG tablet Take 1-2 tablets (1-2 mg total) by mouth at bedtime as needed for anxiety. 60 tablet 2  . clopidogrel (PLAVIX) 75 MG tablet Take 1 tablet (75 mg total) by mouth daily. 30 tablet 1  . donepezil (ARICEPT) 10 MG tablet TAKE 1 TABLET BY MOUTH EVERY NIGHT AT BEDTIME 90 tablet 1  . fish oil-omega-3 fatty acids 1000 MG capsule Take 2 g by mouth 2 (two) times daily.     . furosemide (LASIX) 80 MG tablet Take 1 tablet (80 mg total) by mouth daily. 30 tablet 11  . glycopyrrolate (ROBINUL) 2 MG tablet Take 1 tablet (2 mg total) by mouth 2 (two) times daily. 60 tablet 3  . HYDROcodone-acetaminophen (NORCO/VICODIN) 5-325 MG per tablet Take 1 tablet by mouth every 6 (six) hours as needed for moderate pain. Please fill on or after 07/17/2015 120 tablet 0  . hyoscyamine (LEVSIN SL) 0.125 MG SL tablet Place 2 tablets (0.25 mg total) under the tongue every 4 (four) hours as needed. 30 tablet 0  . insulin glargine (LANTUS) 100 UNIT/ML injection Inject 0.4 mLs (40 Units total) into the skin daily. (Patient  taking differently: Inject 40 Units into the skin at bedtime as needed (for hyperglycemia events only). ) 10 mL 11  . levalbuterol (XOPENEX) 0.63 MG/3ML nebulizer solution Take 1 ampule by nebulization every 6 (six) hours as needed. For wheezing    . levothyroxine (SYNTHROID, LEVOTHROID) 88 MCG tablet TAKE 1 TABLET BY MOUTH EVERY DAY 90 tablet 3  . metFORMIN (GLUCOPHAGE) 1000 MG tablet TAKE 1 TABLET BY MOUTH TWICE DAILY WITH A MEAL 60 tablet 11  . MICRONIZED COLESTIPOL HCL 1 G tablet TAKE 1 TABLET(1 GRAM) BY MOUTH TWICE DAILY 180 tablet 0  . Multiple Vitamin (MULTIVITAMIN WITH MINERALS) TABS tablet Take 1 tablet by mouth daily.    Marland Kitchen NAMENDA XR 28 MG CP24 24 hr capsule TAKE ONE CAPSULE BY MOUTH EVERY DAY 90 capsule 1  . NONFORMULARY OR COMPOUNDED ITEM Allergy Vaccine 1:10 Given at [facility]    . potassium chloride SA (K-DUR,KLOR-CON) 20 MEQ tablet Take 2 tablets (40 mEq total) by mouth daily. 14 tablet 0  . pravastatin (PRAVACHOL) 40 MG tablet Take 0.5 tablets (20 mg total) by mouth every evening. 30 tablet 1  . saccharomyces boulardii (FLORASTOR) 250 MG capsule Take 1 capsule (250 mg total) by mouth 2 (two) times daily. 60 capsule 0  . SYMBICORT 80-4.5 MCG/ACT  inhaler INHALE 2 PUFFS BY MOUTH TWICE DAILY 10.2 g 3  . tiotropium (SPIRIVA) 18 MCG inhalation capsule Place 18 mcg into inhaler and inhale daily.    . valsartan (DIOVAN) 160 MG tablet TAKE 1 TABLET BY MOUTH DAILY 30 tablet 11  . [DISCONTINUED] Calcium Carbonate-Vitamin D (CALCIUM 600/VITAMIN D) 600-400 MG-UNIT per tablet Take 1 tablet by mouth 2 (two) times daily.      . [DISCONTINUED] sodium chloride (OCEAN) 0.65 % nasal spray Place 2 sprays into the nose as needed. TO OPEN NASAL BREATHING     No current facility-administered medications on file prior to visit.   Allergies  Allergen Reactions  . Gabapentin Other (See Comments)    Rxed in ER; not tolerated  . Lipitor [Atorvastatin] Other (See Comments)    Urticaria   .  Zocor [Simvastatin] Other (See Comments)    Urticaria   . Aspirin Itching  . Codeine Itching  . Demerol Itching    Unknown-unconscious.  . Latex Itching  . Lisinopril Itching and Cough

## 2015-08-08 ENCOUNTER — Ambulatory Visit: Payer: Self-pay | Admitting: Neurology

## 2015-08-08 ENCOUNTER — Ambulatory Visit: Payer: Medicare Other | Admitting: Neurology

## 2015-08-17 ENCOUNTER — Other Ambulatory Visit: Payer: Self-pay | Admitting: Adult Health

## 2015-08-19 ENCOUNTER — Other Ambulatory Visit: Payer: Self-pay

## 2015-08-19 MED ORDER — "INSULIN SYRINGE-NEEDLE U-100 29G X 7/16"" 1 ML MISC": Status: DC

## 2015-08-20 NOTE — Telephone Encounter (Signed)
LMVM for sabrina to give me a call back about appt for mother in law.  (need this prior to refill).

## 2015-08-22 NOTE — Telephone Encounter (Signed)
P has appt with Dr. [name] 09-16-15 for follow up.  Gave rx for one month.

## 2015-08-26 ENCOUNTER — Telehealth: Payer: Self-pay | Admitting: *Deleted

## 2015-08-26 MED ORDER — HYDROCODONE-ACETAMINOPHEN 5-325 MG PO TABS: 1.0000 | ORAL_TABLET | Freq: Four times a day (QID) | ORAL | Status: DC | PRN

## 2015-08-26 NOTE — Telephone Encounter (Signed)
Left msg requesting refill on her pain med hydrocodone. Last filled 07/30/15...Raechel Chute

## 2015-08-26 NOTE — Telephone Encounter (Signed)
OK to fill this prescription with additional refills x0 Thank you!  

## 2015-08-26 NOTE — Telephone Encounter (Signed)
Notified pt spoke with grand-daughter (sabrenna) rx ready for pick-up...Raechel Chute

## 2015-08-29 ENCOUNTER — Ambulatory Visit: Payer: Medicare Other | Admitting: Internal Medicine

## 2015-09-01 ENCOUNTER — Other Ambulatory Visit: Payer: Self-pay | Admitting: Internal Medicine

## 2015-09-02 ENCOUNTER — Other Ambulatory Visit: Payer: Self-pay | Admitting: Internal Medicine

## 2015-09-02 NOTE — Telephone Encounter (Signed)
Please advise, thanks.

## 2015-09-04 NOTE — Telephone Encounter (Signed)
Received from pharmacist stating sent request over on 09/02/15 renewal on pt clonazepam. Checking status. Is this ok to refill...Raechel Chute

## 2015-09-04 NOTE — Telephone Encounter (Signed)
Pt called to check up on this, please advise

## 2015-09-05 NOTE — Telephone Encounter (Signed)
Called refill into [facility] spoke with [facility] gave md approval..../lmb

## 2015-09-09 ENCOUNTER — Other Ambulatory Visit: Payer: Self-pay | Admitting: Neurology

## 2015-09-09 ENCOUNTER — Other Ambulatory Visit: Payer: Self-pay

## 2015-09-09 MED ORDER — DONEPEZIL HCL 10 MG PO TABS
10.0000 mg | ORAL_TABLET | Freq: Every day | ORAL | Status: DC
Start: 2015-09-09 — End: 2015-09-16

## 2015-09-09 NOTE — Telephone Encounter (Signed)
Documentation error.  Isabella Plotnikov V, MD prescribed Plavix on 10/03, not Aricept.

## 2015-09-16 ENCOUNTER — Encounter: Payer: Self-pay | Admitting: Neurology

## 2015-09-16 ENCOUNTER — Ambulatory Visit (INDEPENDENT_AMBULATORY_CARE_PROVIDER_SITE_OTHER): Payer: Medicare Other | Admitting: Neurology

## 2015-09-16 VITALS — BP 132/78 | HR 60 | Ht 63.0 in | Wt 186.0 lb

## 2015-09-16 DIAGNOSIS — R269 Unspecified abnormalities of gait and mobility: Secondary | ICD-10-CM

## 2015-09-16 DIAGNOSIS — G3183 Dementia with Lewy bodies: Secondary | ICD-10-CM | POA: Diagnosis not present

## 2015-09-16 DIAGNOSIS — G459 Transient cerebral ischemic attack, unspecified: Secondary | ICD-10-CM

## 2015-09-16 DIAGNOSIS — F0280 Dementia in other diseases classified elsewhere without behavioral disturbance: Secondary | ICD-10-CM

## 2015-09-16 MED ORDER — MEMANTINE HCL ER 28 MG PO CP24: 28.0000 mg | ORAL_CAPSULE | Freq: Every day | ORAL | Status: DC

## 2015-09-16 MED ORDER — DONEPEZIL HCL 10 MG PO TABS: 10.0000 mg | ORAL_TABLET | Freq: Every day | ORAL | Status: DC

## 2015-09-16 NOTE — Progress Notes (Signed)
Chief Complaint  Patient presents with  . Memory Loss    MMSE - animals. She is here with her daughter, [name].  Her family is reporting that her memory is worse.  She is more forgetful and easily gets confused at night.  For example, she sometimes wakes up at 3am thinking it is time for breakfast.       PATIENT: [name] DOB: 03/21/1946  HISTORY 11/17/13 [name]([name]): 69 year old right-handed Caucasian female, accompanied by her family at visit , she is referred by primary care Dr. Filbert [name] for evaluation of memory loss, last clinical visit was August 2013  She has past medical history of hypertension, diabetes,hypothyroidism, depression anxiety, COPD  She had 10 year education, worked as LawyerCNA for 25 years. has 7 children, went on disability in 2004 due to gait difficulty,  She had gradual onset memory loss since 2012, she has labile glucose up to 400s, forget to take her insulin sometimes, increased difficulty over past few months, anxious, not herself, increased falling.  She also has 20 years history of unsteady gait, gradual onset, tends to veer to the right side. she has long-standing history of diabetes, but she denied bilateral lower extremity paresthesia, she previously had valgus toes bilaterally, requiring surgery.  MRI brain in 2012 showed Mild scattered periventricular and subcortical T2 hyperintensities consistent with chronic microvascular ischemic disease, enlarged, partially empty sella.  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  Repeat MRI of brainin 2013 showed mild  atrophy, small vessel disease, no acute lesions, laboratory showed A1c 8.2, elevated TSH, 6.97,   She has trouble remembering, forget grandchildren's name, could not name object. She could not remember conversation. She is eating well, she fell recently with right foot fracture. She sleeps well. She still helps house chore, sweeping floor,doing laundary. She does not cook any more.Overall she is stable, no longer has visual hallucination, less agitated,  She is taking Namenda 10 mg twice a day  Last visit was with nurse practitioner [name], She was able to complete all ADLs independently-- she states that she may need help dressing at times.She lives with her son and daughter-in-law. The patient no longer operates a motor vehicle. Denies any agitation or aggressive behavior. Her son and daughter-in-law handle her finances. The patient no longer cooks. Her family provides her meals. The patient is sleeping well at night. The patient continues to complain of ongoing headaches. She states that usually her headaches are located on the top of her head but can be all over.   Update October 17 [name]:  She was admitted to the hospital July [name] for acute onset language difficulty, transient right arm and facial weakness, I have personally reviewed MRI of the brain July [name], no acute lesions, mild small vessel disease, MRA of the brain showed no large vessel disease. Echocardiogram showed no cardiac embolic source. Carotid artery showed no significant bilateral internal carotid artery stenosis. Laboratory showed LDL 165, A1c 6.6, she was started on Plavix,  She continues to have worsening memory loss. She lives with her son and daughter in law, she  is more confused, got up 3am, ready for breakfast.  Sometimes she put her shoes on the wrong feet.   REVIEW OF SYSTEMS: Out of a complete 14 system review of symptoms, the patient complains only of the following symptoms, and all other reviewed systems  are negative. Chills, leg swelling, cold intolerance, frequent awakening, memory loss, confusion  ALLERGIES: Allergies  Allergen Reactions  . Gabapentin Other (See Comments)    Rxed in ER; not tolerated  . Lipitor [Atorvastatin] Other (See Comments)    Urticaria   . Zocor [Simvastatin] Other (See Comments)    Urticaria   . Aspirin Itching  . Codeine Itching  . Demerol Itching    Unknown-unconscious.  . Latex Itching  . Lisinopril Itching and Cough    HOME MEDICATIONS: Outpatient Prescriptions Prior to Visit  Medication Sig Dispense Refill  . ACCU-CHEK AVIVA PLUS test strip USE TO TEST BLOOD SUGAR 3-4 TIMES DAILY 100 each 3  . acetaminophen (TYLENOL) 500 MG tablet Take 500 mg by mouth every 6 (six) hours as needed.    Marland Kitchen albuterol (PROVENTIL HFA;VENTOLIN HFA) 108 (90 BASE) MCG/ACT inhaler Inhale 2 puffs into the lungs every 6 (six) hours as needed for wheezing or shortness of breath.    Marland Kitchen amoxicillin-clavulanate (AUGMENTIN) 875-125 MG per tablet Take 1 tablet by mouth 2 (two) times daily. 14 tablet 0  . azelastine (ASTELIN) 0.1 % nasal spray INSERT 1 TO 2 SPRAYS INTO EACH NOSTRIL ONCE OR TWICE DAILY AS NEEDED 30 mL 0  . BIOTIN PO Take 1 capsule by mouth daily.    . clonazePAM (KLONOPIN) 1 MG tablet TAKE 1 TO 2 TABLETS BY MOUTH AT BEDTIME AS NEEDED FOR ANXIETY 60 tablet 2  . clopidogrel (PLAVIX) 75 MG tablet TAKE 1 TABLET BY MOUTH EVERY DAY 30 tablet 1  . donepezil (ARICEPT) 10 MG tablet Take 1 tablet (10 mg total) by mouth at bedtime. 30 tablet 0  . fish oil-omega-3 fatty acids 1000 MG capsule Take 2 g by mouth 2 (two) times daily.     . furosemide (LASIX) 80 MG tablet Take 1 tablet (80 mg total) by mouth daily. 30 tablet 11  . glycopyrrolate (ROBINUL) 2 MG tablet Take 1 tablet (2 mg total) by mouth 2 (two) times daily. 60 tablet 3  . HYDROcodone-acetaminophen (NORCO/VICODIN) 5-325 MG per tablet Take 1 tablet by mouth every 6 (six) hours as needed for moderate pain. 120 tablet 0    . hyoscyamine (LEVSIN SL) 0.125 MG SL tablet Place 2 tablets (0.25 mg total) under the tongue every 4 (four) hours as needed. 30 tablet 0  . insulin glargine (LANTUS) 100 UNIT/ML injection Inject 0.4 mLs (40 Units total) into the skin daily. (Patient taking differently: Inject 40 Units into the skin at bedtime as needed (for hyperglycemia events only). ) 10 mL 11  . Insulin Syringe-Needle U-100 29G X 7/16" 1 ML MISC Use to administer insulin daily. DX E11.09 100 each 3  . levalbuterol (XOPENEX) 0.63 MG/3ML nebulizer solution Take 1 ampule by nebulization every 6 (six) hours as needed. For wheezing    . levothyroxine (SYNTHROID, LEVOTHROID) 88 MCG tablet TAKE 1 TABLET BY MOUTH EVERY DAY 90 tablet 3  . metFORMIN (GLUCOPHAGE) 1000 MG tablet TAKE 1 TABLET BY MOUTH TWICE DAILY WITH A MEAL 60 tablet 11  . MICRONIZED COLESTIPOL HCL 1 G tablet TAKE 1 TABLET(1 GRAM) BY MOUTH TWICE DAILY 180 tablet 0  . Multiple Vitamin (MULTIVITAMIN WITH MINERALS) TABS tablet Take 1 tablet by mouth  daily.    Marland Kitchen NAMENDA XR 28 MG CP24 24 hr capsule TAKE 1 CAPSULE BY MOUTH EVERY DAY 30 capsule 0  . NONFORMULARY OR COMPOUNDED ITEM Allergy Vaccine 1:10 Given at [facility]    . potassium chloride SA (K-DUR,KLOR-CON) 20 MEQ tablet Take 2 tablets (40 mEq total) by mouth daily. 14 tablet 0  . pravastatin (PRAVACHOL) 40 MG tablet Take 0.5 tablets (20 mg total) by mouth every evening. 30 tablet 1  . saccharomyces boulardii (FLORASTOR) 250 MG capsule Take 1 capsule (250 mg total) by mouth 2 (two) times daily. 60 capsule 0  . SYMBICORT 80-4.5 MCG/ACT inhaler INHALE 2 PUFFS BY MOUTH TWICE DAILY 10.2 g 3  . tiotropium (SPIRIVA) 18 MCG inhalation capsule Place 18 mcg into inhaler and inhale daily.    . valsartan (DIOVAN) 160 MG tablet TAKE 1 TABLET BY MOUTH DAILY 30 tablet 11   No facility-administered medications prior to visit.    PAST MEDICAL HISTORY: Past Medical History  Diagnosis Date  . Personal history of colonic  polyps   . Iron deficiency anemia, unspecified   . Other malaise and fatigue   . Chest pain, unspecified   . Other B-complex deficiencies   . Arthritis     Right Sacroiliac joint  . Other and unspecified hyperlipidemia   . Anxiety and depression   . HTN (hypertension)   . Unspecified hypothyroidism   . Type II or unspecified type diabetes mellitus without mention of complication, not stated as uncontrolled   . Allergic rhinitis, cause unspecified   . Unspecified asthma(493.90)   . Chronic airway obstruction, not elsewhere classified   . Abnormal stress test 08/21/2008    Neg  . GERD (gastroesophageal reflux disease)   . TIA (transient ischemic attack)     PAST SURGICAL HISTORY: Past Surgical History  Procedure Laterality Date  . Foot surgery      hammer toe 2004, 2005  . Abdominal hysterectomy  1982    menometorrhagia  . Carpal tunnel release  03/05/2009    bilateral  . Colonoscopy N/A 09/06/2013    Procedure: COLONOSCOPY;  Surgeon: Louis Meckel, MD;  Location: [facility];  Service: Endoscopy;  Laterality: N/A;  . Hot hemostasis N/A 09/06/2013    Procedure: HOT HEMOSTASIS (ARGON PLASMA COAGULATION/BICAP);  Surgeon: Louis Meckel, MD;  Location: [facility];  Service: Endoscopy;  Laterality: N/A;    FAMILY HISTORY: Family History  Problem Relation Age of Onset  . Hypertension Mother   . Diabetes Mother   . Heart disease Mother     CHF  . Coronary artery disease Mother   . Lung cancer Father   . Hypertension Father   . Diabetes Father   . Liver cancer Father     w/mets  . Prostate cancer Father   . Breast cancer Sister   . Lung disease Brother     Agent Orange Related  . Asthma Brother   . Allergies Brother   . Colon cancer Neg Hx     SOCIAL HISTORY: Social History   Social History  . Marital Status: Legally Separated    Spouse Name: N/A  . Number of Children: 6  . Years of Education: 10   Occupational History  . Retired    Social History Main  Topics  . Smoking status: Former Smoker    Quit date: 06/01/1997  . Smokeless tobacco: Never Used     Comment: Quit 9 years ago.  . Alcohol Use: No  . Drug Use:  No  . Sexual Activity: Not on file   Other Topics Concern  . 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 No feather bedding, no mold. Son smokes. Pets including dogs, cats, 3 birds.    Angioedema with Aspirin.   Daily caffeine use two cups a day   Regular Exercise-yes               PHYSICAL EXAM  Filed Vitals:   09/16/15 1708  BP: 132/78  Pulse: 60  Height: 5\' 3"  (1.6 m)  Weight: 186 lb (84.369 kg)   Body mass index is 32.96 kg/(m^2).   PHYSICAL EXAMNIATION:  Gen: NAD, conversant, well nourised, obese, well groomed                     Cardiovascular: Regular rate rhythm, no peripheral edema, warm, nontender. Eyes: Conjunctivae clear without exudates or hemorrhage Neck: Supple, no carotid bruise. Pulmonary: Clear to auscultation bilaterally   NEUROLOGICAL EXAM:  MENTAL STATUS: Speech:    Speech is normal; fluent and spontaneous with normal comprehension.   Cognition: MMSE 16/30, animals 3.     Orientation to time, place and person: She is not oriented to date, year, month, day, season,     recent and remote memory: she missed 3/3 recalls.     Attention span and concentration: could not spell world backward     Normal Language, naming, repeating,spontaneous speech, she has difficulty copy design and write sentence.     Fund of knowledge   CRANIAL NERVES: CN II: Visual fields are full to confrontation. Fundoscopic exam is normal with  sharp discs and no vascular changes. Pupils are round equal and briskly reactive to light. CN III, IV, VI: extraocular movement are normal. No ptosis. CN V: Facial sensation is intact to pinprick in all 3 divisions bilaterally. Corneal responses are intact.  CN VII: Face is symmetric with normal eye closure and smile. CN VIII: Hearing is normal to rubbing fingers CN IX, X: Palate elevates symmetrically. Phonation is normal. CN XI: Head turning and shoulder shrug are intact CN XII: Tongue is midline with normal movements and no atrophy.  MOTOR: Muscle bulk and tone are normal. Muscle strength is normal. She has mild bilateral ankle dorsiflexion weakness  REFLEXES: Reflexes are 1 and symmetric at the biceps, triceps, knees, and absent ankles. Plantar responses are flexor.  SENSORY: Length dependent decreased light touch pinprick  COORDINATION: Rapid alternating movements and fine finger movements are intact. There is no dysmetria on finger-to-nose and heel-knee-shin.    GAIT/STANCE: She needs assistance to get up from seated position, cautious, mildly unsteady   DIAGNOSTIC DATA (LABS, IMAGING, TESTING) - I reviewed patient records, labs, notes, testing and imaging myself where available.  MRI of the brain with and without contrast 06/23/2012: No acute abnormality. Atrophy and mild to moderate chronic mcrovascular ischemic changes in the white matter and pons.  Lab Results  Component Value Date   WBC 7.3 07/21/[name]   HGB 13.3 07/21/[name]   HCT 39.0 07/21/[name]   MCV 87.6 07/21/[name]   PLT 222 07/21/[name]  Component Value Date/Time   NA 143 07/23/[name] 0417   K 3.5 07/23/[name] 0417   CL 105 07/23/[name] 0417   CO2 30 07/23/[name] 0417   GLUCOSE 54* 07/23/[name] 0417   BUN 11 07/23/[name] 0417   CREATININE 0.95 07/23/[name] 0417   CALCIUM 8.9 07/23/[name] 0417   PROT 5.6* 07/22/[name] 0323   ALBUMIN 3.0* 07/22/[name] 0323   AST 21 07/22/[name] 0323   ALT 13* 07/22/[name] 0323   ALKPHOS 55  07/22/[name] 0323   BILITOT 1.4* 07/22/[name] 0323   GFRNONAA 59* 07/23/[name] 0417   GFRAA >60 07/23/[name] 0417   Lab Results  Component Value Date   CHOL 232* 07/22/[name]   HDL 35* 07/22/[name]   LDLCALC 165* 07/22/[name]   LDLDIRECT 130.4 11/02/2013   TRIG 160* 07/22/[name]   CHOLHDL 6.6 07/22/[name]   Lab Results  Component Value Date   HGBA1C 6.6* 07/22/[name]   Lab Results  Component Value Date   VITAMINB12 298 03/01/[name]   Lab Results  Component Value Date   TSH 0.66 03/01/[name]    ASSESSMENT AND PLAN  Dementia:  Refill her Namenda X are 28 mg daily, Aricept 10 mg a day TIA:  Continue to optimize blood pressure control, goal blood pressure less than 130 /80, LDL less than 70, A1c less than 7  Plavix daily    Levert Feinstein, M.D. Ph.D.  [facility] [address] [place], [place] [address] Phone: [phone] Fax:      [phone]

## 2015-09-23 ENCOUNTER — Telehealth: Payer: Self-pay | Admitting: *Deleted

## 2015-09-23 MED ORDER — HYDROCODONE-ACETAMINOPHEN 5-325 MG PO TABS: 1.0000 | ORAL_TABLET | Freq: Four times a day (QID) | ORAL | Status: DC | PRN

## 2015-09-23 NOTE — Telephone Encounter (Signed)
OK to fill this prescription with additional refills x0 Thank you!  

## 2015-09-23 NOTE — Telephone Encounter (Signed)
Notified pt rx ready for pick-up.../lmb 

## 2015-09-23 NOTE — Telephone Encounter (Signed)
Left msg on triage requesting refill on her hydrocodone.../lmb 

## 2015-09-25 ENCOUNTER — Telehealth: Payer: Self-pay | Admitting: Internal Medicine

## 2015-09-25 MED ORDER — AMOXICILLIN-POT CLAVULANATE 875-125 MG PO TABS: ORAL_TABLET | ORAL | Status: DC

## 2015-09-25 NOTE — Telephone Encounter (Signed)
Called and spoke with pt's daughter in law. Verified the pharmacy as [facility] on [address]. She voiced understanding and had no further questions. Rx sent to pharmacy. Nothing further needed. Will sign off on message.

## 2015-09-25 NOTE — Telephone Encounter (Signed)
Augmentin 875 mg take one pill twice daily  X 10 days - take at breakfast and supper with large glass of water.  It would help reduce the usual side effects (diarrhea and yeast infections) if you ate cultured yogurt at lunch.  

## 2015-09-25 NOTE — Telephone Encounter (Signed)
Spoke with pt's daughter in Publishing rights managerlaw [name]. Reports coughing with production of yellow mucus, chest congestion and fever. Denies SOB or chest tightness. Onset was 1 week ago. Wants Augmentin called in.  MR - please advise in CY absence. Thanks.

## 2015-09-26 ENCOUNTER — Telehealth: Payer: Self-pay | Admitting: Neurology

## 2015-09-26 DIAGNOSIS — F03918 Unspecified dementia, unspecified severity, with other behavioral disturbance: Secondary | ICD-10-CM

## 2015-09-26 DIAGNOSIS — F0391 Unspecified dementia with behavioral disturbance: Secondary | ICD-10-CM

## 2015-09-26 MED ORDER — QUETIAPINE FUMARATE 25 MG PO TABS: ORAL_TABLET | ORAL | Status: DC

## 2015-09-26 NOTE — Telephone Encounter (Signed)
Per vo by Dr. Terrace ArabiaYan, send in Seroquel 25mg , 1-2 tablets at bedtime. Spoke to [place], she is aware and will start the medication.

## 2015-09-26 NOTE — Telephone Encounter (Signed)
[name]/Dgt in law [phone] called to advise that since patient was here 09/16/15, patient has been talking to pictures on the wall and thinking they can hear and see her also people on tv. Martie LeeSabrina states that patient has never done this before.

## 2015-09-30 NOTE — Telephone Encounter (Addendum)
pts dgt in law, Martie LeeSabrina, called about giving 2 tablets of Seroquel would be ok. She stated that she was told so but wanted to clarify first. Pt is not sleeping through the night with only 1 tablet of Seroquel. Please call and advise [phone]

## 2015-09-30 NOTE — Telephone Encounter (Signed)
Spoke to [name] - she will try 2 tablets of Seroquel at bedtime, as directed by MD.

## 2015-10-01 MED ORDER — RISPERIDONE 0.5 MG PO TABS: ORAL_TABLET | ORAL | Status: DC

## 2015-10-01 NOTE — Telephone Encounter (Signed)
Spoke to [name] - she is agreeable to the change and will start the risperidone tonight.

## 2015-10-01 NOTE — Telephone Encounter (Signed)
Please advise patient to stop seroquel, I have written new prescriptions risperidone 0.5 milligrams 1-2 tablets every night for agitations, if needed, may schedule her follow-up in few weeks

## 2015-10-01 NOTE — Telephone Encounter (Signed)
Isabella LeeSabrina called to advise since patient has been taking Seroquel patient is delusional and it's worse than ever. Wonders if this is a side effect of the medication. Please call [phone].

## 2015-10-01 NOTE — Addendum Note (Signed)
Addended by: Levert FeinsteinYAN, Demere Dotzler on: 10/01/2015 05:45 PM   Modules accepted: Orders

## 2015-10-01 NOTE — Telephone Encounter (Signed)
Spoke to [name] - they tried seroquel 25mg , 2 tablets at bedtime but says it was not helpful - she was still up and down all night.  She feels the medication has made her more agitated and she seemed more confused today.

## 2015-10-07 ENCOUNTER — Encounter: Payer: Self-pay | Admitting: Internal Medicine

## 2015-10-14 ENCOUNTER — Other Ambulatory Visit: Payer: Self-pay

## 2015-10-14 ENCOUNTER — Other Ambulatory Visit: Payer: Self-pay | Admitting: Internal Medicine

## 2015-10-14 MED ORDER — BUDESONIDE-FORMOTEROL FUMARATE 80-4.5 MCG/ACT IN AERO: 2.0000 | INHALATION_SPRAY | Freq: Two times a day (BID) | RESPIRATORY_TRACT | Status: DC

## 2015-10-15 ENCOUNTER — Other Ambulatory Visit: Payer: Self-pay | Admitting: Physician Assistant

## 2015-10-15 ENCOUNTER — Other Ambulatory Visit: Payer: Self-pay | Admitting: Neurology

## 2015-10-22 ENCOUNTER — Ambulatory Visit: Payer: Self-pay | Admitting: Neurology

## 2015-10-23 ENCOUNTER — Telehealth: Payer: Self-pay | Admitting: *Deleted

## 2015-10-23 MED ORDER — HYDROCODONE-ACETAMINOPHEN 5-325 MG PO TABS: 1.0000 | ORAL_TABLET | Freq: Four times a day (QID) | ORAL | Status: DC | PRN

## 2015-10-23 NOTE — Telephone Encounter (Signed)
Notified pt rx ready for pick-up.../lmb 

## 2015-10-23 NOTE — Telephone Encounter (Signed)
Left msg on triage stating will be needing refill on her hydrocodone. Refill due on 11/26, but wasn't sure if md will be working wanting to pick up today...Raechel Chute/lmb

## 2015-10-23 NOTE — Telephone Encounter (Signed)
OK to fill this prescription with additional refills x0 to fill on 11/26. Pt needs OV q 3 mo - pls sch Thank you!

## 2015-10-30 ENCOUNTER — Emergency Department (HOSPITAL_COMMUNITY): Payer: Medicare Other

## 2015-10-30 ENCOUNTER — Encounter (HOSPITAL_COMMUNITY): Payer: Self-pay

## 2015-10-30 ENCOUNTER — Emergency Department (HOSPITAL_COMMUNITY)
Admission: EM | Admit: 2015-10-30 | Discharge: 2015-10-30 | Disposition: A | Discharge status: Home / Self Care | Payer: Medicare Other | Attending: Emergency Medicine | Admitting: Emergency Medicine

## 2015-10-30 DIAGNOSIS — M25561 Pain in right knee: Secondary | ICD-10-CM | POA: Diagnosis not present

## 2015-10-30 DIAGNOSIS — Z86010 Personal history of colonic polyps: Secondary | ICD-10-CM | POA: Diagnosis not present

## 2015-10-30 DIAGNOSIS — S8991XA Unspecified injury of right lower leg, initial encounter: Secondary | ICD-10-CM | POA: Insufficient documentation

## 2015-10-30 DIAGNOSIS — I10 Essential (primary) hypertension: Secondary | ICD-10-CM | POA: Diagnosis not present

## 2015-10-30 DIAGNOSIS — J45909 Unspecified asthma, uncomplicated: Secondary | ICD-10-CM | POA: Diagnosis not present

## 2015-10-30 DIAGNOSIS — Y9389 Activity, other specified: Secondary | ICD-10-CM | POA: Diagnosis not present

## 2015-10-30 DIAGNOSIS — R51 Headache: Secondary | ICD-10-CM | POA: Diagnosis not present

## 2015-10-30 DIAGNOSIS — W19XXXA Unspecified fall, initial encounter: Secondary | ICD-10-CM

## 2015-10-30 DIAGNOSIS — E119 Type 2 diabetes mellitus without complications: Secondary | ICD-10-CM | POA: Insufficient documentation

## 2015-10-30 DIAGNOSIS — Z8739 Personal history of other diseases of the musculoskeletal system and connective tissue: Secondary | ICD-10-CM | POA: Diagnosis not present

## 2015-10-30 DIAGNOSIS — Z87891 Personal history of nicotine dependence: Secondary | ICD-10-CM | POA: Diagnosis not present

## 2015-10-30 DIAGNOSIS — Z792 Long term (current) use of antibiotics: Secondary | ICD-10-CM | POA: Diagnosis not present

## 2015-10-30 DIAGNOSIS — F418 Other specified anxiety disorders: Secondary | ICD-10-CM | POA: Diagnosis not present

## 2015-10-30 DIAGNOSIS — W1839XA Other fall on same level, initial encounter: Secondary | ICD-10-CM | POA: Insufficient documentation

## 2015-10-30 DIAGNOSIS — Z91040 Latex allergy status: Secondary | ICD-10-CM | POA: Insufficient documentation

## 2015-10-30 DIAGNOSIS — S8992XA Unspecified injury of left lower leg, initial encounter: Secondary | ICD-10-CM | POA: Diagnosis not present

## 2015-10-30 DIAGNOSIS — Y998 Other external cause status: Secondary | ICD-10-CM | POA: Insufficient documentation

## 2015-10-30 DIAGNOSIS — M25562 Pain in left knee: Secondary | ICD-10-CM | POA: Diagnosis not present

## 2015-10-30 DIAGNOSIS — Y9289 Other specified places as the place of occurrence of the external cause: Secondary | ICD-10-CM | POA: Insufficient documentation

## 2015-10-30 DIAGNOSIS — Z8673 Personal history of transient ischemic attack (TIA), and cerebral infarction without residual deficits: Secondary | ICD-10-CM | POA: Insufficient documentation

## 2015-10-30 DIAGNOSIS — Z79899 Other long term (current) drug therapy: Secondary | ICD-10-CM | POA: Insufficient documentation

## 2015-10-30 DIAGNOSIS — S0990XA Unspecified injury of head, initial encounter: Secondary | ICD-10-CM | POA: Diagnosis not present

## 2015-10-30 DIAGNOSIS — Z862 Personal history of diseases of the blood and blood-forming organs and certain disorders involving the immune mechanism: Secondary | ICD-10-CM | POA: Diagnosis not present

## 2015-10-30 DIAGNOSIS — Z8719 Personal history of other diseases of the digestive system: Secondary | ICD-10-CM | POA: Insufficient documentation

## 2015-10-30 DIAGNOSIS — R6889 Other general symptoms and signs: Secondary | ICD-10-CM | POA: Diagnosis not present

## 2015-10-30 DIAGNOSIS — M25569 Pain in unspecified knee: Secondary | ICD-10-CM | POA: Diagnosis not present

## 2015-10-30 LAB — CBC WITH DIFFERENTIAL/PLATELET
Basophils Absolute: 0 10*3/uL (ref 0.0–0.1)
Basophils Relative: 1 %
Eosinophils Absolute: 0 10*3/uL (ref 0.0–0.7)
Eosinophils Relative: 0 %
HEMATOCRIT: 38.6 % (ref 36.0–46.0)
HEMOGLOBIN: 12.8 g/dL (ref 12.0–15.0)
LYMPHS ABS: 1.9 10*3/uL (ref 0.7–4.0)
LYMPHS PCT: 22 %
MCH: 30.4 pg (ref 26.0–34.0)
MCHC: 33.2 g/dL (ref 30.0–36.0)
MCV: 91.7 fL (ref 78.0–100.0)
MONOS PCT: 3 %
Monocytes Absolute: 0.3 10*3/uL (ref 0.1–1.0)
NEUTROS ABS: 6.2 10*3/uL (ref 1.7–7.7)
NEUTROS PCT: 74 %
Platelets: 210 10*3/uL (ref 150–400)
RBC: 4.21 MIL/uL (ref 3.87–5.11)
RDW: 13 % (ref 11.5–15.5)
WBC: 8.5 10*3/uL (ref 4.0–10.5)

## 2015-10-30 LAB — CBG MONITORING, ED
Glucose-Capillary: 44 mg/dL — CL (ref 65–99)
Glucose-Capillary: 96 mg/dL (ref 65–99)
Glucose-Capillary: 152 mg/dL — ABNORMAL HIGH (ref 65–99)

## 2015-10-30 LAB — BASIC METABOLIC PANEL
Anion gap: 8 (ref 5–15)
BUN: 13 mg/dL (ref 6–20)
CHLORIDE: 102 mmol/L (ref 101–111)
CO2: 32 mmol/L (ref 22–32)
CREATININE: 0.87 mg/dL (ref 0.44–1.00)
Calcium: 10.2 mg/dL (ref 8.9–10.3)
GFR calc Af Amer: >60 mL/min (ref >60)
Glucose, Bld: 58 mg/dL — ABNORMAL LOW (ref 65–99)
POTASSIUM: 3.4 mmol/L — AB (ref 3.5–5.1)
Sodium: 142 mmol/L (ref 135–145)

## 2015-10-30 LAB — URINALYSIS, ROUTINE W REFLEX MICROSCOPIC
Bilirubin Urine: NEGATIVE
Glucose, UA: NEGATIVE mg/dL
Hgb urine dipstick: NEGATIVE
Ketones, ur: NEGATIVE mg/dL
LEUKOCYTES UA: NEGATIVE
NITRITE: NEGATIVE
PH: 7 (ref 5.0–8.0)
Protein, ur: 30 mg/dL — AB
SPECIFIC GRAVITY, URINE: 1.01 (ref 1.005–1.030)

## 2015-10-30 LAB — URINE MICROSCOPIC-ADD ON

## 2015-10-30 NOTE — ED Notes (Signed)
Pt given 2 orange juices and graham crackers with peanut butter per verbal order of Mumma, MD. Will recheck pts CBG in 30 minutes

## 2015-10-30 NOTE — ED Notes (Signed)
To x-ray

## 2015-10-30 NOTE — ED Notes (Signed)
Pt asking for bedpan  Not sure if she can go will leave female urinal in place

## 2015-10-30 NOTE — ED Provider Notes (Signed)
CSN: [id number]     Arrival date & time 10/30/15  1423 History   First MD Initiated Contact with Patient 10/30/15 1425     Chief Complaint  Patient presents with  . Knee Pain     (Consider location/radiation/quality/duration/timing/severity/associated sxs/prior Treatment) HPI   Patient is a 69 year old female with past medical history of dementia, hypertension, diabetes and arthritis who presents to the ED via EMS with complaint of bilateral knee pain. Patient reports she was standing in the bathroom when she started to feel "shaky" and fell onto her knees. Denies head injury or LOC. Denies any pain or complaints at this time. Daughter reports she found the pt standing in the bathroom shaking and notes she tried to tried to assist the pt with standing. Daughter states the pt was unable to get stable on her feet which resulted in the pt slowly sliding onto her knees. She notes the pt has unsteady gait at baseline and notes that she is suppose to ambulate with a cane/walker but does not like to use them. Denies fever, chills, headache, lightheadedness, dizziness, visual changes, cough, SOB, CP, abdominal pain, N/V/D, urinary sxs, numbness, tingling, weakness.   Past Medical History  Diagnosis Date  . Personal history of colonic polyps   . Iron deficiency anemia, unspecified   . Other malaise and fatigue   . Chest pain, unspecified   . Other B-complex deficiencies   . Arthritis     Right Sacroiliac joint  . Other and unspecified hyperlipidemia   . Anxiety and depression   . HTN (hypertension)   . Unspecified hypothyroidism   . Type II or unspecified type diabetes mellitus without mention of complication, not stated as uncontrolled   . Allergic rhinitis, cause unspecified   . Unspecified asthma(493.90)   . Chronic airway obstruction, not elsewhere classified   . Abnormal stress test 08/21/2008    Neg  . GERD (gastroesophageal reflux disease)   . TIA (transient ischemic attack)     Past Surgical History  Procedure Laterality Date  . Foot surgery      hammer toe 2004, 2005  . Abdominal hysterectomy  1982    menometorrhagia  . Carpal tunnel release  03/05/2009    bilateral  . Colonoscopy N/A 09/06/2013    Procedure: COLONOSCOPY;  Surgeon: Louis Meckel, MD;  Location: [facility];  Service: Endoscopy;  Laterality: N/A;  . Hot hemostasis N/A 09/06/2013    Procedure: HOT HEMOSTASIS (ARGON PLASMA COAGULATION/BICAP);  Surgeon: Louis Meckel, MD;  Location: [facility];  Service: Endoscopy;  Laterality: N/A;   Family History  Problem Relation Age of Onset  . Hypertension Mother   . Diabetes Mother   . Heart disease Mother     CHF  . Coronary artery disease Mother   . Lung cancer Father   . Hypertension Father   . Diabetes Father   . Liver cancer Father     w/mets  . Prostate cancer Father   . Breast cancer Sister   . Lung disease Brother     Agent Orange Related  . Asthma Brother   . Allergies Brother   . Colon cancer Neg Hx    Social History  Substance Use Topics  . Smoking status: Former Smoker    Quit date: 06/01/1997  . Smokeless tobacco: Never Used     Comment: Quit 9 years ago.  . Alcohol Use: No   OB History    No data available     Review of Systems  All other systems reviewed and are negative.     Allergies  Gabapentin; Lipitor; Zocor; Aspirin; Codeine; Demerol; Latex; and Lisinopril  Home Medications   Prior to Admission medications   Medication Sig Start Date End Date Taking? Authorizing Provider  ACCU-CHEK AVIVA PLUS test strip USE TO TEST BLOOD SUGAR 3-4 TIMES DAILY 07/30/15   Jacinta Shoe V, MD  acetaminophen (TYLENOL) 500 MG tablet Take 500 mg by mouth every 6 (six) hours as needed.    Historical Provider, MD  albuterol (PROVENTIL HFA;VENTOLIN HFA) 108 (90 BASE) MCG/ACT inhaler Inhale 2 puffs into the lungs every 6 (six) hours as needed for wheezing or shortness of breath.    Historical Provider, MD   amoxicillin-clavulanate (AUGMENTIN) 875-125 MG per tablet Take 1 tablet by mouth 2 (two) times daily. 07/31/15   Waymon Budge, MD  amoxicillin-clavulanate (AUGMENTIN) 875-125 MG tablet Take 1 tablet by mouth twice a day with meal for 10 days 09/25/15   Nyoka Cowden, MD  azelastine (ASTELIN) 0.1 % nasal spray INSERT 1 TO 2 SPRAYS INTO EACH NOSTRIL ONCE OR TWICE DAILY AS NEEDED 05/22/15   Waymon Budge, MD  BIOTIN PO Take 1 capsule by mouth daily.    Historical Provider, MD  budesonide-formoterol (SYMBICORT) 80-4.5 MCG/ACT inhaler Inhale 2 puffs into the lungs 2 (two) times daily. 10/14/15   Aleksei Plotnikov V, MD  clonazePAM (KLONOPIN) 1 MG tablet TAKE 1 TO 2 TABLETS BY MOUTH AT BEDTIME AS NEEDED FOR ANXIETY 09/04/15   Aleksei Plotnikov V, MD  clopidogrel (PLAVIX) 75 MG tablet TAKE 1 TABLET BY MOUTH EVERY DAY 09/02/15   Aleksei Plotnikov V, MD  colestipol (COLESTID) 1 G tablet TAKE 1 TABLET(1 GRAM) BY MOUTH TWICE DAILY 10/15/15   Lori P Hvozdovic, PA-C  donepezil (ARICEPT) 10 MG tablet Take 1 tablet (10 mg total) by mouth at bedtime. 09/16/15   Levert Feinstein, MD  donepezil (ARICEPT) 10 MG tablet TAKE 1 TABLET BY MOUTH EVERY NIGHT AT BEDTIME 10/15/15   Levert Feinstein, MD  fish oil-omega-3 fatty acids 1000 MG capsule Take 2 g by mouth 2 (two) times daily.     Historical Provider, MD  furosemide (LASIX) 80 MG tablet Take 1 tablet (80 mg total) by mouth daily. 05/29/15   Aleksei Plotnikov V, MD  glycopyrrolate (ROBINUL) 2 MG tablet Take 1 tablet (2 mg total) by mouth 2 (two) times daily. 07/17/15   Louis Meckel, MD  HYDROcodone-acetaminophen (NORCO/VICODIN) 5-325 MG tablet Take 1 tablet by mouth every 6 (six) hours as needed for moderate pain. 10/23/15   Aleksei Plotnikov V, MD  hyoscyamine (LEVSIN SL) 0.125 MG SL tablet Place 2 tablets (0.25 mg total) under the tongue every 4 (four) hours as needed. 07/11/15   Louis Meckel, MD  insulin glargine (LANTUS) 100 UNIT/ML injection Inject 0.4 mLs (40 Units  total) into the skin daily. Patient taking differently: Inject 40 Units into the skin at bedtime as needed (for hyperglycemia events only).  12/06/14   Aleksei Plotnikov V, MD  Insulin Syringe-Needle U-100 29G X 7/16" 1 ML MISC Use to administer insulin daily. DX E11.09 08/19/15   Aleksei Plotnikov V, MD  levalbuterol (XOPENEX) 0.63 MG/3ML nebulizer solution Take 1 ampule by nebulization every 6 (six) hours as needed. For wheezing 11/28/12   Waymon Budge, MD  levothyroxine (SYNTHROID, LEVOTHROID) 88 MCG tablet TAKE 1 TABLET BY MOUTH EVERY DAY 04/18/15   Aleksei Plotnikov V, MD  memantine (NAMENDA XR) 28 MG CP24 24 hr capsule Take  1 capsule (28 mg total) by mouth daily. 09/16/15   Levert FeinsteinYijun Yan, MD  metFORMIN (GLUCOPHAGE) 1000 MG tablet TAKE 1 TABLET BY MOUTH TWICE DAILY WITH A MEAL 03/21/15   Aleksei Plotnikov V, MD  MICRONIZED COLESTIPOL HCL 1 G tablet TAKE 1 TABLET(1 GRAM) BY MOUTH TWICE DAILY 05/09/15   Lori P Hvozdovic, PA-C  Multiple Vitamin (MULTIVITAMIN WITH MINERALS) TABS tablet Take 1 tablet by mouth daily.    Historical Provider, MD  NONFORMULARY OR COMPOUNDED ITEM Allergy Vaccine 1:10 [facility]    Historical Provider, MD  potassium chloride SA (K-DUR,KLOR-CON) 20 MEQ tablet Take 2 tablets (40 mEq total) by mouth daily. 06/22/15   Christiane Haorinna L Sullivan, MD  pravastatin (PRAVACHOL) 40 MG tablet Take 0.5 tablets (20 mg total) by mouth every evening. 06/22/15   Christiane Haorinna L Sullivan, MD  QUEtiapine (SEROQUEL) 25 MG tablet Take 1-2 tablets at bedtime. 09/26/15   Levert FeinsteinYijun Yan, MD  risperiDONE (RISPERDAL) 0.5 MG tablet One to 2 tabs po qhs 10/01/15   Levert FeinsteinYijun Yan, MD  saccharomyces boulardii (FLORASTOR) 250 MG capsule Take 1 capsule (250 mg total) by mouth 2 (two) times daily. 05/09/15   Lori P Hvozdovic, PA-C  SYMBICORT 80-4.5 MCG/ACT inhaler INHALE 2 PUFFS BY MOUTH TWICE DAILY 10/14/15   Aleksei Plotnikov V, MD  tiotropium (SPIRIVA) 18 MCG inhalation capsule Place 18 mcg into inhaler and inhale  daily.    Historical Provider, MD  valsartan (DIOVAN) 160 MG tablet TAKE 1 TABLET BY MOUTH DAILY 03/21/15   Aleksei Plotnikov V, MD   BP 210/86 mmHg  Pulse 58  Temp(Src) 97.5 F (36.4 C) (Oral)  Resp 16  SpO2 98% Physical Exam  Constitutional: She is oriented to person, place, and time. She appears well-developed and well-nourished. No distress.  HENT:  Head: Normocephalic and atraumatic.  Mouth/Throat: Oropharynx is clear and moist. No oropharyngeal exudate.  Eyes: Conjunctivae and EOM are normal. Pupils are equal, round, and reactive to light. Right eye exhibits no discharge. Left eye exhibits no discharge. No scleral icterus.  Neck: Normal range of motion. Neck supple.  Cardiovascular: Normal rate, regular rhythm, normal heart sounds and intact distal pulses.   Pulmonary/Chest: Effort normal and breath sounds normal. No respiratory distress. She has no wheezes. She has no rales. She exhibits no tenderness.  Abdominal: Soft. Bowel sounds are normal. She exhibits no distension and no mass. There is no tenderness. There is no rebound and no guarding.  Musculoskeletal: Normal range of motion.       Right knee: She exhibits normal range of motion, no swelling, no effusion, no ecchymosis, no deformity, no laceration, no erythema, normal alignment, no LCL laxity, normal patellar mobility and no MCL laxity. Tenderness found.       Left knee: She exhibits normal range of motion, no swelling, no effusion, no ecchymosis, no deformity, no laceration, no erythema, normal alignment, no LCL laxity, normal patellar mobility and no MCL laxity. Tenderness found.  Mild tenderness in bilateral anterior knees, 2+ PT pulses, sensation intact. Mild nonpitting edema noted to BLE.  Lymphadenopathy:    She has no cervical adenopathy.  Neurological: She is alert and oriented to person, place, and time. She has normal strength. No cranial nerve deficit or sensory deficit. She displays a negative Romberg sign.  Skin:  Skin is warm and dry. She is not diaphoretic.  Nursing note and vitals reviewed.   ED Course  Procedures (including critical care time) Labs Review Labs Reviewed  URINALYSIS, ROUTINE W REFLEX  MICROSCOPIC (NOT AT [facility]) - Abnormal; Notable for the following:    Protein, ur 30 (*)    All other components within normal limits  URINE MICROSCOPIC-ADD ON - Abnormal; Notable for the following:    Squamous Epithelial / LPF 0-5 (*)    Bacteria, UA FEW (*)    All other components within normal limits  CBC WITH DIFFERENTIAL/PLATELET  BASIC METABOLIC PANEL    Imaging Review Dg Knee Complete 4 Views Left  10/30/2015  CLINICAL DATA:  Tried to stand from sitting on the toilet and was unable to do so, went to knees, complaining of knee pain, was on knees for 10 minutes, hypertension, diabetes mellitus EXAM: LEFT KNEE - COMPLETE 4+ VIEW COMPARISON:  None FINDINGS: Osseous demineralization. Diffuse joint space narrowing and marginal spur formation. Chondrocalcinosis question CPPD. No acute fracture, dislocation or bone destruction. No knee joint effusion. Scattered atherosclerotic calcifications. Few benign-appearing soft tissue calcifications anteriorly at the LEFT leg question tiny phleboliths. IMPRESSION: Osseous demineralization with osteoarthritic changes LEFT knee. No acute abnormality. Electronically Signed   By: Ulyses Southward M.D.   On: 10/30/2015 16:20   Dg Knee Complete 4 Views Right  10/30/2015  CLINICAL DATA:  Fall in bathroom. Knee injury and pain. Initial encounter. EXAM: RIGHT KNEE - COMPLETE 4+ VIEW COMPARISON:  None. FINDINGS: There is no evidence of fracture, dislocation, or joint effusion. Moderate tricompartmental osteoarthritis noted. Generalized osteopenia seen as well as peripheral vascular calcification. IMPRESSION: No acute findings. Moderate tricompartmental osteoarthritis. Electronically Signed   By: Myles Rosenthal M.D.   On: 10/30/2015 16:21   I have personally reviewed and evaluated  these images and lab results as part of my medical decision-making.    MDM   Final diagnoses:  None    Pt presents with reported fall from standing position onto knees due to unsteady gait. Denies head injury or LOC. Daughter reports pt's PMH to include dementia, HTN, DM, TIA and GERD. Denies any changes in mental status or behavior. Pt walks with walker/cane at baseline. VSS. Exam revealed mild tenderness to bilateral anterior knees, no signs of injury or trauma, bilateral lower extremities neurovascularly intact, remaining exam unremarkable. No neuro deficits. Bilateral knee xrays show no acute findings. UA unremarkable.   Dr. Jodi Mourning evaluated pt. At that time more family members at bedside who report pt is mildly worse than her baseline mental status. CT head w/o contrast ordered.   Hand-off to Lorrin Jackson, MD. Pending labs and head CT, assuming no acute findings will plan to d/c pt home.      Satira Sark [place], [place] 10/30/15 1649  Blane Ohara, MD 10/30/15 [phone]

## 2015-10-30 NOTE — ED Provider Notes (Signed)
Patient was initially evaluated by Melburn HakeNicole Nadeau PA-C, please see her note for H&P.   Patient is a 69 year old female with PMH of dementia, unstable gait at baseline. Fall from standing to knees. Mechanical fall. Concerns that she is not at baseline. Neuro exam normal. Knee XR negative. Obtaining baseline labs and CT head. If w/u negative will d/c home with daughter.   Patient had a blood glucose of 44. Patient has not eaten anything since lunch time. Given juice and crackers. Repeat blood glucose 152. CT head showed no acute findings. Lab work including CBC, CMP, UA were unremarkable. On reevaluation, patient's family feels like the patient is back to her baseline. Patient is alert and oriented, answering all questions appropriately. Patient able to ambulate in the emergency department. Patient will be discharged home, she lives at home with her son and daughter-in-law. Discussed that the patient should follow-up with her primary care physician in the next 1-2 days. Discussed strict return precautions to the emergency department. Patient and her family members express understanding. No questions or concerns at time of discharge.  Corena HerterShannon Ravyn Nikkel, MD 10/30/15 [id number]  Eber HongBrian Miller, MD 11/01/15 1340

## 2015-10-30 NOTE — ED Notes (Signed)
Per EMS - pt was trying to move to standing position in bathroom from toilet and was unable to do so. Daughter went to help and pt unable to gain footing and went onto knees. Pt on knees for 10 minutes and now c/o knee pain. VSS.

## 2015-11-04 ENCOUNTER — Other Ambulatory Visit: Payer: Self-pay | Admitting: Internal Medicine

## 2015-11-05 ENCOUNTER — Telehealth: Payer: Self-pay | Admitting: Internal Medicine

## 2015-11-05 ENCOUNTER — Other Ambulatory Visit: Payer: Self-pay | Admitting: Internal Medicine

## 2015-11-14 ENCOUNTER — Ambulatory Visit: Payer: Medicare Other | Admitting: Internal Medicine

## 2015-11-15 ENCOUNTER — Other Ambulatory Visit: Payer: Self-pay | Admitting: Internal Medicine

## 2015-11-26 ENCOUNTER — Telehealth: Payer: Self-pay | Admitting: Internal Medicine

## 2015-11-26 MED ORDER — HYDROCODONE-ACETAMINOPHEN 5-325 MG PO TABS: 1.0000 | ORAL_TABLET | Freq: Four times a day (QID) | ORAL | Status: DC | PRN

## 2015-11-26 NOTE — Telephone Encounter (Signed)
OK to fill this prescription with additional refills x0  - sch OV Thx Thank you!

## 2015-11-26 NOTE — Telephone Encounter (Signed)
Pt request refill for HYDROcodone-acetaminophen (NORCO/VICODIN) 5-325 MG, pt stated she really need this for today if possible

## 2015-11-26 NOTE — Telephone Encounter (Signed)
Please call [name] at [phone] when med is ready for pick up.

## 2015-12-06 ENCOUNTER — Other Ambulatory Visit: Payer: Self-pay | Admitting: Internal Medicine

## 2015-12-10 ENCOUNTER — Telehealth: Payer: Self-pay

## 2015-12-10 NOTE — Telephone Encounter (Signed)
By viewing the chart, Dr Terrace ArabiaYan sent 11 refills to the pharmacy in Oct.  I called the patient back.  Got no answer.  Left message.

## 2015-12-10 NOTE — Telephone Encounter (Signed)
PT needs refill of Namenda has an apt but needs to cx due to insurance issues and will r/s when the problem is fixed please advise

## 2015-12-11 ENCOUNTER — Other Ambulatory Visit: Payer: Self-pay | Admitting: Internal Medicine

## 2015-12-11 ENCOUNTER — Ambulatory Visit: Payer: Self-pay | Admitting: Neurology

## 2015-12-13 ENCOUNTER — Other Ambulatory Visit: Payer: Self-pay | Admitting: Internal Medicine

## 2015-12-13 NOTE — Telephone Encounter (Signed)
Cl[facility]azepam rx called in [facility] 

## 2015-12-19 ENCOUNTER — Encounter: Payer: Self-pay | Admitting: *Deleted

## 2015-12-19 ENCOUNTER — Telehealth: Payer: Self-pay | Admitting: Neurology

## 2015-12-19 ENCOUNTER — Telehealth: Payer: Self-pay | Admitting: Internal Medicine

## 2015-12-19 NOTE — Telephone Encounter (Signed)
Is requesting letter to take to [facility], stating patient has dementia and cant live by herself.  Is requesting call in regards.

## 2015-12-19 NOTE — Telephone Encounter (Signed)
[name]/dgt in law [phone] called to advise, she is trying to get help through [facility] and they will need letter stating patient has dementia and cannot live by herself. [name] requests letter to be picked up today while she has transportation to pick up.

## 2015-12-19 NOTE — Telephone Encounter (Signed)
Letter provided and placed up front for pick up - family aware.

## 2015-12-20 NOTE — Telephone Encounter (Signed)
Left vm to make appt

## 2015-12-20 NOTE — Telephone Encounter (Signed)
pls sch OV - not seen in 6 mo Thx

## 2015-12-25 ENCOUNTER — Telehealth: Payer: Self-pay | Admitting: Internal Medicine

## 2015-12-25 MED ORDER — AMOXICILLIN-POT CLAVULANATE 875-125 MG PO TABS: 1.0000 | ORAL_TABLET | Freq: Two times a day (BID) | ORAL | Status: DC

## 2015-12-25 NOTE — Telephone Encounter (Signed)
Offer augmentin 875 mg, # 14, 1 twice daily 

## 2015-12-25 NOTE — Telephone Encounter (Signed)
Pt's daughter aware of recs.  rx sent to preferred pharmacy.  Nothing further needed.

## 2015-12-25 NOTE — Telephone Encounter (Signed)
Spoke with the pt's daughter Martie Lee  She states that pt is c/o rhinits, sinus pressure/HA, and yellow/green nasal d/c for the past 3 days  She is requesting rx for augmentin  Pt denies any SOB, f/c/s, CP/chest tightness or other co's Last ov 11-12-14  N/S for Dec 2016 ov  No appt pending here   Allergies  Allergen Reactions  . Gabapentin Other (See Comments)    Rxed in ER; not tolerated  . Lipitor [Atorvastatin] Other (See Comments)    Urticaria   . Zocor [Simvastatin] Other (See Comments)    Urticaria   . Aspirin Itching  . Codeine Itching  . Demerol Itching    Unknown-unconscious.  . Latex Itching  . Lisinopril Itching and Cough   Current Outpatient Prescriptions on File Prior to Visit  Medication Sig Dispense Refill  . ACCU-CHEK AVIVA PLUS test strip USE TO TEST BLOOD SUGAR 3-4 TIMES A DAY 100 each 0  . acetaminophen (TYLENOL) 500 MG tablet Take 500 mg by mouth every 6 (six) hours as needed for mild pain.     Marland Kitchen albuterol (PROVENTIL HFA;VENTOLIN HFA) 108 (90 BASE) MCG/ACT inhaler Inhale 2 puffs into the lungs every 6 (six) hours as needed for wheezing or shortness of breath.    Marland Kitchen amoxicillin-clavulanate (AUGMENTIN) 875-125 MG per tablet Take 1 tablet by mouth 2 (two) times daily. 14 tablet 0  . amoxicillin-clavulanate (AUGMENTIN) 875-125 MG tablet Take 1 tablet by mouth twice a day with meal for 10 days (Patient not taking: Reported on 10/30/2015) 20 tablet 0  . azelastine (ASTELIN) 0.1 % nasal spray INSERT 1 TO 2 SPRAYS INTO EACH NOSTRIL ONCE OR TWICE DAILY AS NEEDED 30 mL 0  . BIOTIN PO Take 1 capsule by mouth daily.    . budesonide-formoterol (SYMBICORT) 80-4.5 MCG/ACT inhaler Inhale 2 puffs into the lungs 2 (two) times daily. 10.2 g 3  . clonazePAM (KLONOPIN) 1 MG tablet TAKE 1 TO 2 TABLETS BY MOUTH AT BEDTIME AS NEEDED FOR ANXIETY 60 tablet 3  . clopidogrel (PLAVIX) 75 MG tablet TAKE 1 TABLET BY MOUTH EVERY DAY 30 tablet 1  . colestipol (COLESTID) 1 G tablet TAKE 1  TABLET(1 GRAM) BY MOUTH TWICE DAILY 60 tablet 3  . donepezil (ARICEPT) 10 MG tablet Take 1 tablet (10 mg total) by mouth at bedtime. 30 tablet 11  . donepezil (ARICEPT) 10 MG tablet TAKE 1 TABLET BY MOUTH EVERY NIGHT AT BEDTIME (Patient not taking: Reported on 10/30/2015) 30 tablet 11  . fish oil-omega-3 fatty acids 1000 MG capsule Take 2 g by mouth 2 (two) times daily.     . furosemide (LASIX) 80 MG tablet Take 1 tablet (80 mg total) by mouth daily. 30 tablet 11  . glycopyrrolate (ROBINUL) 2 MG tablet Take 1 tablet (2 mg total) by mouth 2 (two) times daily. (Patient not taking: Reported on 10/30/2015) 60 tablet 3  . HYDROcodone-acetaminophen (NORCO/VICODIN) 5-325 MG tablet Take 1 tablet by mouth every 6 (six) hours as needed for moderate pain. 120 tablet 0  . hyoscyamine (LEVSIN SL) 0.125 MG SL tablet Place 2 tablets (0.25 mg total) under the tongue every 4 (four) hours as needed. (Patient taking differently: Place 0.25 mg under the tongue every 4 (four) hours as needed for cramping. ) 30 tablet 0  . Insulin Syringe-Needle U-100 29G X 7/16" 1 ML MISC Use to administer insulin daily. DX E11.09 100 each 3  . LANTUS 100 UNIT/ML injection INJECT 40 UNITS INTO THE SKIN ONCE DAILY 10 mL  0  . levothyroxine (SYNTHROID, LEVOTHROID) 88 MCG tablet TAKE 1 TABLET BY MOUTH EVERY DAY 90 tablet 3  . memantine (NAMENDA XR) 28 MG CP24 24 hr capsule Take 1 capsule (28 mg total) by mouth daily. 30 capsule 11  . metFORMIN (GLUCOPHAGE) 1000 MG tablet TAKE 1 TABLET BY MOUTH TWICE DAILY WITH A MEAL 60 tablet 11  . MICRONIZED COLESTIPOL HCL 1 G tablet TAKE 1 TABLET(1 GRAM) BY MOUTH TWICE DAILY 180 tablet 0  . Multiple Vitamin (MULTIVITAMIN WITH MINERALS) TABS tablet Take 1 tablet by mouth daily.    . NONFORMULARY OR COMPOUNDED ITEM Allergy Vaccine 1:10 Given at [facility]    . potassium chloride SA (K-DUR,KLOR-CON) 20 MEQ tablet Take 2 tablets (40 mEq total) by mouth daily. 14 tablet 0  . pravastatin (PRAVACHOL)  40 MG tablet TAKE 1/2 TABLET BY MOUTH EVERY EVENING 30 tablet 3  . QUEtiapine (SEROQUEL) 25 MG tablet Take 1-2 tablets at bedtime. (Patient not taking: Reported on 10/30/2015) 60 tablet 0  . risperiDONE (RISPERDAL) 0.5 MG tablet One to 2 tabs po qhs (Patient not taking: Reported on 10/30/2015) 60 tablet 3  . saccharomyces boulardii (FLORASTOR) 250 MG capsule Take 1 capsule (250 mg total) by mouth 2 (two) times daily. 60 capsule 0  . SYMBICORT 80-4.5 MCG/ACT inhaler INHALE 2 PUFFS BY MOUTH TWICE DAILY 10.2 g 0  . tiotropium (SPIRIVA) 18 MCG inhalation capsule Place 18 mcg into inhaler and inhale daily.    . valsartan (DIOVAN) 160 MG tablet TAKE 1 TABLET BY MOUTH DAILY 30 tablet 11  . [DISCONTINUED] Calcium Carbonate-Vitamin D (CALCIUM 600/VITAMIN D) 600-400 MG-UNIT per tablet Take 1 tablet by mouth 2 (two) times daily.      . [DISCONTINUED] sodium chloride (OCEAN) 0.65 % nasal spray Place 2 sprays into the nose as needed. TO OPEN NASAL BREATHING     No current facility-administered medications on file prior to visit.

## 2015-12-26 ENCOUNTER — Ambulatory Visit (INDEPENDENT_AMBULATORY_CARE_PROVIDER_SITE_OTHER): Payer: Medicare Other | Admitting: Internal Medicine

## 2015-12-26 ENCOUNTER — Encounter: Payer: Self-pay | Admitting: Internal Medicine

## 2015-12-26 VITALS — BP 130/68 | HR 65 | Wt 188.0 lb

## 2015-12-26 DIAGNOSIS — E1122 Type 2 diabetes mellitus with diabetic chronic kidney disease: Secondary | ICD-10-CM

## 2015-12-26 DIAGNOSIS — G300 Alzheimer's disease with early onset: Secondary | ICD-10-CM | POA: Diagnosis not present

## 2015-12-26 DIAGNOSIS — Z794 Long term (current) use of insulin: Secondary | ICD-10-CM

## 2015-12-26 DIAGNOSIS — N183 Chronic kidney disease, stage 3 (moderate): Secondary | ICD-10-CM

## 2015-12-26 DIAGNOSIS — F0280 Dementia in other diseases classified elsewhere without behavioral disturbance: Secondary | ICD-10-CM

## 2015-12-26 DIAGNOSIS — M545 Low back pain, unspecified: Secondary | ICD-10-CM | POA: Insufficient documentation

## 2015-12-26 DIAGNOSIS — G459 Transient cerebral ischemic attack, unspecified: Secondary | ICD-10-CM

## 2015-12-26 DIAGNOSIS — I10 Essential (primary) hypertension: Secondary | ICD-10-CM

## 2015-12-26 DIAGNOSIS — F0282 Dementia in other diseases classified elsewhere, unspecified severity, with psychotic disturbance: Secondary | ICD-10-CM

## 2015-12-26 DIAGNOSIS — F22 Delusional disorders: Secondary | ICD-10-CM

## 2015-12-26 DIAGNOSIS — E1165 Type 2 diabetes mellitus with hyperglycemia: Secondary | ICD-10-CM

## 2015-12-26 DIAGNOSIS — IMO0002 Reserved for concepts with insufficient information to code with codable children: Secondary | ICD-10-CM

## 2015-12-26 MED ORDER — HYDROCODONE-ACETAMINOPHEN 5-325 MG PO TABS: 1.0000 | ORAL_TABLET | Freq: Four times a day (QID) | ORAL | Status: DC | PRN

## 2015-12-26 NOTE — Assessment & Plan Note (Signed)
Cont w/meds - metformin, basal insulin - Lantus Better

## 2015-12-26 NOTE — Assessment & Plan Note (Signed)
Diovan, Furosemide

## 2015-12-26 NOTE — Assessment & Plan Note (Signed)
Diovan, Furosemide, Plavix

## 2015-12-26 NOTE — Progress Notes (Signed)
Pre visit review using our clinic review tool, if applicable. No additional management support is needed unless otherwise documented below in the visit note. 

## 2015-12-26 NOTE — Progress Notes (Signed)
Subjective:  Patient ID: [name], female    DOB: 1946/01/16  Age: 70 y.o. MRN: [id number]  CC: No chief complaint on file.   HPI [name] presents for LBP, DM2, HTN, dementia f/u  Outpatient Prescriptions Prior to Visit  Medication Sig Dispense Refill  . ACCU-CHEK AVIVA PLUS test strip USE TO TEST BLOOD SUGAR 3-4 TIMES A DAY 100 each 0  . acetaminophen (TYLENOL) 500 MG tablet Take 500 mg by mouth every 6 (six) hours as needed for mild pain.     Marland Kitchen albuterol (PROVENTIL HFA;VENTOLIN HFA) 108 (90 BASE) MCG/ACT inhaler Inhale 2 puffs into the lungs every 6 (six) hours as needed for wheezing or shortness of breath.    Marland Kitchen amoxicillin-clavulanate (AUGMENTIN) 875-125 MG tablet Take 1 tablet by mouth 2 (two) times daily. 14 tablet 0  . azelastine (ASTELIN) 0.1 % nasal spray INSERT 1 TO 2 SPRAYS INTO EACH NOSTRIL ONCE OR TWICE DAILY AS NEEDED 30 mL 0  . BIOTIN PO Take 1 capsule by mouth daily.    . budesonide-formoterol (SYMBICORT) 80-4.5 MCG/ACT inhaler Inhale 2 puffs into the lungs 2 (two) times daily. 10.2 g 3  . clonazePAM (KLONOPIN) 1 MG tablet TAKE 1 TO 2 TABLETS BY MOUTH AT BEDTIME AS NEEDED FOR ANXIETY 60 tablet 3  . clopidogrel (PLAVIX) 75 MG tablet TAKE 1 TABLET BY MOUTH EVERY DAY 30 tablet 1  . colestipol (COLESTID) 1 G tablet TAKE 1 TABLET(1 GRAM) BY MOUTH TWICE DAILY 60 tablet 3  . donepezil (ARICEPT) 10 MG tablet Take 1 tablet (10 mg total) by mouth at bedtime. 30 tablet 11  . fish oil-omega-3 fatty acids 1000 MG capsule Take 2 g by mouth 2 (two) times daily.     . furosemide (LASIX) 80 MG tablet Take 1 tablet (80 mg total) by mouth daily. 30 tablet 11  . glycopyrrolate (ROBINUL) 2 MG tablet Take 1 tablet (2 mg total) by mouth 2 (two) times daily. 60 tablet 3  . hyoscyamine (LEVSIN SL) 0.125 MG SL tablet Place 2 tablets (0.25 mg total) under the tongue every 4 (four) hours as needed. (Patient taking differently: Place 0.25 mg under the tongue every 4 (four) hours as  needed for cramping. ) 30 tablet 0  . Insulin Syringe-Needle U-100 29G X 7/16" 1 ML MISC Use to administer insulin daily. DX E11.09 100 each 3  . LANTUS 100 UNIT/ML injection INJECT 40 UNITS INTO THE SKIN ONCE DAILY 10 mL 0  . levothyroxine (SYNTHROID, LEVOTHROID) 88 MCG tablet TAKE 1 TABLET BY MOUTH EVERY DAY 90 tablet 3  . memantine (NAMENDA XR) 28 MG CP24 24 hr capsule Take 1 capsule (28 mg total) by mouth daily. 30 capsule 11  . metFORMIN (GLUCOPHAGE) 1000 MG tablet TAKE 1 TABLET BY MOUTH TWICE DAILY WITH A MEAL 60 tablet 11  . MICRONIZED COLESTIPOL HCL 1 G tablet TAKE 1 TABLET(1 GRAM) BY MOUTH TWICE DAILY 180 tablet 0  . Multiple Vitamin (MULTIVITAMIN WITH MINERALS) TABS tablet Take 1 tablet by mouth daily.    . NONFORMULARY OR COMPOUNDED ITEM Allergy Vaccine 1:10 Given at [facility]    . potassium chloride SA (K-DUR,KLOR-CON) 20 MEQ tablet Take 2 tablets (40 mEq total) by mouth daily. 14 tablet 0  . pravastatin (PRAVACHOL) 40 MG tablet TAKE 1/2 TABLET BY MOUTH EVERY EVENING 30 tablet 3  . QUEtiapine (SEROQUEL) 25 MG tablet Take 1-2 tablets at bedtime. 60 tablet 0  . risperiDONE (RISPERDAL) 0.5 MG tablet One to 2  tabs po qhs 60 tablet 3  . saccharomyces boulardii (FLORASTOR) 250 MG capsule Take 1 capsule (250 mg total) by mouth 2 (two) times daily. 60 capsule 0  . tiotropium (SPIRIVA) 18 MCG inhalation capsule Place 18 mcg into inhaler and inhale daily.    . valsartan (DIOVAN) 160 MG tablet TAKE 1 TABLET BY MOUTH DAILY 30 tablet 11  . amoxicillin-clavulanate (AUGMENTIN) 875-125 MG per tablet Take 1 tablet by mouth 2 (two) times daily. 14 tablet 0  . amoxicillin-clavulanate (AUGMENTIN) 875-125 MG tablet Take 1 tablet by mouth twice a day with meal for 10 days 20 tablet 0  . donepezil (ARICEPT) 10 MG tablet TAKE 1 TABLET BY MOUTH EVERY NIGHT AT BEDTIME 30 tablet 11  . HYDROcodone-acetaminophen (NORCO/VICODIN) 5-325 MG tablet Take 1 tablet by mouth every 6 (six) hours as needed for  moderate pain. 120 tablet 0  . SYMBICORT 80-4.5 MCG/ACT inhaler INHALE 2 PUFFS BY MOUTH TWICE DAILY 10.2 g 0   No facility-administered medications prior to visit.    ROS Review of Systems  Constitutional: Negative for chills, activity change, appetite change, fatigue and unexpected weight change.  HENT: Negative for congestion, mouth sores and sinus pressure.   Eyes: Negative for visual disturbance.  Respiratory: Negative for cough and chest tightness.   Cardiovascular: Negative for leg swelling.  Gastrointestinal: Negative for nausea and abdominal pain.  Genitourinary: Negative for frequency, difficulty urinating and vaginal pain.  Musculoskeletal: Positive for back pain and arthralgias. Negative for gait problem.  Skin: Negative for pallor and rash.  Neurological: Negative for dizziness, tremors, weakness, numbness and headaches.  Psychiatric/Behavioral: Positive for confusion and dysphoric mood. Negative for suicidal ideas and sleep disturbance. The patient is nervous/anxious.     Objective:  BP 130/68 mmHg  Pulse 65  Wt 188 lb (85.276 kg)  SpO2 96%  BP Readings from Last 3 Encounters:  12/26/15 130/68  10/30/15 150/67  09/16/15 132/78    Wt Readings from Last 3 Encounters:  12/26/15 188 lb (85.276 kg)  09/16/15 186 lb (84.369 kg)  07/11/15 180 lb 6.4 oz (81.829 kg)    Physical Exam  Constitutional: She appears well-developed. No distress.  HENT:  Head: Normocephalic.  Right Ear: External ear normal.  Left Ear: External ear normal.  Nose: Nose normal.  Mouth/Throat: Oropharynx is clear and moist.  Eyes: Conjunctivae are normal. Pupils are equal, round, and reactive to light. Right eye exhibits no discharge. Left eye exhibits no discharge.  Neck: Normal range of motion. Neck supple. No JVD present. No tracheal deviation present. No thyromegaly present.  Cardiovascular: Normal rate, regular rhythm and normal heart sounds.   Pulmonary/Chest: No stridor. No  respiratory distress. She has no wheezes.  Abdominal: Soft. Bowel sounds are normal. She exhibits no distension and no mass. There is no tenderness. There is no rebound and no guarding.  Musculoskeletal: She exhibits tenderness. She exhibits no edema.  Lymphadenopathy:    She has no cervical adenopathy.  Neurological: She displays normal reflexes. No cranial nerve deficit. She exhibits normal muscle tone. Coordination normal.  Skin: No rash noted. No erythema.  Psychiatric: She has a normal mood and affect. Her behavior is normal. Judgment and thought content normal.  Obese LS, knees tender  Lab Results  Component Value Date   WBC 8.5 10/30/2015   HGB 12.8 10/30/2015   HCT 38.6 10/30/2015   PLT 210 10/30/2015   GLUCOSE 58* 10/30/2015   CHOL 232* 06/21/2015   TRIG 160* 06/21/2015   HDL 35*  06/21/2015   LDLDIRECT 130.4 11/02/2013   LDLCALC 165* 06/21/2015   ALT 13* 06/21/2015   AST 21 06/21/2015   NA 142 10/30/2015   K 3.4* 10/30/2015   CL 102 10/30/2015   CREATININE 0.87 10/30/2015   BUN 13 10/30/2015   CO2 32 10/30/2015   TSH 0.66 01/29/2015   HGBA1C 6.6* 06/21/2015   MICROALBUR 6.3* 05/10/2013    Ct Head Wo Contrast  10/30/2015  CLINICAL DATA:  Status post fall in the bathroom.  Hit her head. EXAM: CT HEAD WITHOUT CONTRAST TECHNIQUE: Contiguous axial images were obtained from the base of the skull through the vertex without intravenous contrast. COMPARISON:  MR brain 06/21/2015 FINDINGS: There is no evidence of mass effect, midline shift, or extra-axial fluid collections. There is no evidence of a space-occupying lesion or intracranial hemorrhage. There is no evidence of a cortical-based area of acute infarction. There is generalized cerebral atrophy. There is periventricular white matter low attenuation likely secondary to microangiopathy. The ventricles and sulci are appropriate for the patient's age. The basal cisterns are patent. Visualized portions of the orbits are  unremarkable. The visualized portions of the paranasal sinuses and mastoid air cells are unremarkable. Cerebrovascular atherosclerotic calcifications are noted. The osseous structures are unremarkable. IMPRESSION: 1. No acute intracranial pathology. 2. Chronic microvascular disease and cerebral atrophy. Electronically Signed   By: Elige Ko   On: 10/30/2015 18:02   Dg Knee Complete 4 Views Left  10/30/2015  CLINICAL DATA:  Tried to stand from sitting on the toilet and was unable to do so, went to knees, complaining of knee pain, was on knees for 10 minutes, hypertension, diabetes mellitus EXAM: LEFT KNEE - COMPLETE 4+ VIEW COMPARISON:  None FINDINGS: Osseous demineralization. Diffuse joint space narrowing and marginal spur formation. Chondrocalcinosis question CPPD. No acute fracture, dislocation or bone destruction. No knee joint effusion. Scattered atherosclerotic calcifications. Few benign-appearing soft tissue calcifications anteriorly at the LEFT leg question tiny phleboliths. IMPRESSION: Osseous demineralization with osteoarthritic changes LEFT knee. No acute abnormality. Electronically Signed   By: Ulyses Southward M.D.   On: 10/30/2015 16:20   Dg Knee Complete 4 Views Right  10/30/2015  CLINICAL DATA:  Fall in bathroom. Knee injury and pain. Initial encounter. EXAM: RIGHT KNEE - COMPLETE 4+ VIEW COMPARISON:  None. FINDINGS: There is no evidence of fracture, dislocation, or joint effusion. Moderate tricompartmental osteoarthritis noted. Generalized osteopenia seen as well as peripheral vascular calcification. IMPRESSION: No acute findings. Moderate tricompartmental osteoarthritis. Electronically Signed   By: Myles Rosenthal M.D.   On: 10/30/2015 16:21    Assessment & Plan:   Diagnoses and all orders for this visit:  Essential hypertension  Transient cerebral ischemia, unspecified transient cerebral ischemia type  Uncontrolled type 2 diabetes mellitus with stage 3 chronic kidney disease, with  long-term current use of insulin (HCC)  Dementia of the Alzheimer's type, with early onset, with delusions  Bilateral low back pain, with sciatica presence unspecified  Other orders -     Discontinue: HYDROcodone-acetaminophen (NORCO/VICODIN) 5-325 MG tablet; Take 1 tablet by mouth every 6 (six) hours as needed for moderate pain. -     Discontinue: HYDROcodone-acetaminophen (NORCO/VICODIN) 5-325 MG tablet; Take 1 tablet by mouth every 6 (six) hours as needed for moderate pain. -     HYDROcodone-acetaminophen (NORCO/VICODIN) 5-325 MG tablet; Take 1 tablet by mouth every 6 (six) hours as needed for severe pain.   I have discontinued [name]'s SYMBICORT, HYDROcodone-acetaminophen, and HYDROcodone-acetaminophen. I have also changed her HYDROcodone-acetaminophen.  Additionally, I am having her maintain her fish oil-omega-3 fatty acids, multivitamin with minerals, albuterol, tiotropium, valsartan, metFORMIN, levothyroxine, saccharomyces boulardii, MICRONIZED COLESTIPOL HCL, azelastine, furosemide, acetaminophen, BIOTIN PO, potassium chloride SA, hyoscyamine, glycopyrrolate, NONFORMULARY OR COMPOUNDED ITEM, Insulin Syringe-Needle U-100, clopidogrel, donepezil, memantine, QUEtiapine, risperiDONE, budesonide-formoterol, colestipol, ACCU-CHEK AVIVA PLUS, pravastatin, LANTUS, clonazePAM, and amoxicillin-clavulanate.  Meds ordered this encounter  Medications  . DISCONTD: HYDROcodone-acetaminophen (NORCO/VICODIN) 5-325 MG tablet    Sig: Take 1 tablet by mouth every 6 (six) hours as needed for moderate pain.    Dispense:  120 tablet    Refill:  0    Fill on or after 12/26/15  . DISCONTD: HYDROcodone-acetaminophen (NORCO/VICODIN) 5-325 MG tablet    Sig: Take 1 tablet by mouth every 6 (six) hours as needed for moderate pain.    Dispense:  120 tablet    Refill:  0    Fill on or after 01/26/16  . HYDROcodone-acetaminophen (NORCO/VICODIN) 5-325 MG tablet    Sig: Take 1 tablet by mouth every 6 (six) hours as  needed for severe pain.    Dispense:  120 tablet    Refill:  0    Fill on or after 02/23/16     Follow-up: Return in about 3 months (around 03/25/2016).  Sonda Primes, MD

## 2015-12-26 NOTE — Assessment & Plan Note (Signed)
On Namenda 

## 2015-12-31 ENCOUNTER — Telehealth: Payer: Self-pay | Admitting: Internal Medicine

## 2015-12-31 NOTE — Telephone Encounter (Signed)
States patient has not slept in 4 days.  States klonopin is not work.  Is requesting something else instead.  Patient uses [facility] on [address].

## 2016-01-01 ENCOUNTER — Other Ambulatory Visit: Payer: Self-pay | Admitting: Internal Medicine

## 2016-01-01 NOTE — Telephone Encounter (Signed)
Try Clonazepam 1.2 2 tabs at hs Thx

## 2016-01-01 NOTE — Telephone Encounter (Signed)
Pt is calling back to follow up on the note below.

## 2016-01-02 NOTE — Telephone Encounter (Signed)
Agree. Thx 

## 2016-01-02 NOTE — Telephone Encounter (Signed)
States patient is having hallucinations.  Thinks its because of clonazepam.  I have scheduled patient to come in tomorrow 2/3 to be seen.

## 2016-01-03 ENCOUNTER — Ambulatory Visit (INDEPENDENT_AMBULATORY_CARE_PROVIDER_SITE_OTHER): Payer: Medicare Other | Admitting: Internal Medicine

## 2016-01-03 ENCOUNTER — Encounter: Payer: Self-pay | Admitting: Internal Medicine

## 2016-01-03 VITALS — BP 118/60 | HR 60 | Wt 189.0 lb

## 2016-01-03 DIAGNOSIS — F22 Delusional disorders: Secondary | ICD-10-CM

## 2016-01-03 DIAGNOSIS — F0391 Unspecified dementia with behavioral disturbance: Secondary | ICD-10-CM

## 2016-01-03 DIAGNOSIS — F0280 Dementia in other diseases classified elsewhere without behavioral disturbance: Secondary | ICD-10-CM

## 2016-01-03 DIAGNOSIS — F0282 Dementia in other diseases classified elsewhere, unspecified severity, with psychotic disturbance: Secondary | ICD-10-CM

## 2016-01-03 DIAGNOSIS — G300 Alzheimer's disease with early onset: Secondary | ICD-10-CM | POA: Diagnosis not present

## 2016-01-03 DIAGNOSIS — R197 Diarrhea, unspecified: Secondary | ICD-10-CM | POA: Diagnosis not present

## 2016-01-03 DIAGNOSIS — F341 Dysthymic disorder: Secondary | ICD-10-CM | POA: Diagnosis not present

## 2016-01-03 DIAGNOSIS — F0392 Unspecified dementia, unspecified severity, with psychotic disturbance: Secondary | ICD-10-CM

## 2016-01-03 MED ORDER — MEMANTINE HCL-DONEPEZIL HCL ER 28-10 MG PO CP24: 1.0000 | ORAL_CAPSULE | Freq: Every day | ORAL | Status: DC

## 2016-01-03 MED ORDER — HYDROXYZINE HCL 50 MG PO TABS: 50.0000 mg | ORAL_TABLET | Freq: Three times a day (TID) | ORAL | Status: DC | PRN

## 2016-01-03 NOTE — Progress Notes (Signed)
Pre visit review using our clinic review tool, if applicable. No additional management support is needed unless otherwise documented below in the visit note. 

## 2016-01-03 NOTE — Progress Notes (Signed)
Subjective:  Patient ID: [name], female    DOB: 01/20/1946  Age: 70 y.o. MRN: [id number]  CC: No chief complaint on file.   HPI [name] presents for hallucinations, confusion - worse. Worse on Clonazepam. C/o diarrhea - worse  Outpatient Prescriptions Prior to Visit  Medication Sig Dispense Refill  . ACCU-CHEK AVIVA PLUS test strip USE TO TEST BLOOD SUGAR 3-4 TIMES A DAY 100 each 0  . acetaminophen (TYLENOL) 500 MG tablet Take 500 mg by mouth every 6 (six) hours as needed for mild pain.     Marland Kitchen albuterol (PROVENTIL HFA;VENTOLIN HFA) 108 (90 BASE) MCG/ACT inhaler Inhale 2 puffs into the lungs every 6 (six) hours as needed for wheezing or shortness of breath.    Marland Kitchen azelastine (ASTELIN) 0.1 % nasal spray INSERT 1 TO 2 SPRAYS INTO EACH NOSTRIL ONCE OR TWICE DAILY AS NEEDED 30 mL 0  . BIOTIN PO Take 1 capsule by mouth daily.    . budesonide-formoterol (SYMBICORT) 80-4.5 MCG/ACT inhaler Inhale 2 puffs into the lungs 2 (two) times daily. 10.2 g 3  . clopidogrel (PLAVIX) 75 MG tablet TAKE 1 TABLET BY MOUTH EVERY DAY 30 tablet 1  . colestipol (COLESTID) 1 G tablet TAKE 1 TABLET(1 GRAM) BY MOUTH TWICE DAILY 60 tablet 3  . fish oil-omega-3 fatty acids 1000 MG capsule Take 2 g by mouth 2 (two) times daily.     . furosemide (LASIX) 40 MG tablet TAKE 1 AND 1/2 TABLET BY MOUTH EVERY DAY 45 tablet 2  . glycopyrrolate (ROBINUL) 2 MG tablet Take 1 tablet (2 mg total) by mouth 2 (two) times daily. 60 tablet 3  . HYDROcodone-acetaminophen (NORCO/VICODIN) 5-325 MG tablet Take 1 tablet by mouth every 6 (six) hours as needed for severe pain. 120 tablet 0  . hyoscyamine (LEVSIN SL) 0.125 MG SL tablet Place 2 tablets (0.25 mg total) under the tongue every 4 (four) hours as needed. (Patient taking differently: Place 0.25 mg under the tongue every 4 (four) hours as needed for cramping. ) 30 tablet 0  . Insulin Syringe-Needle U-100 29G X 7/16" 1 ML MISC Use to administer insulin daily. DX E11.09 100  each 3  . LANTUS 100 UNIT/ML injection INJECT 40 UNITS INTO THE SKIN ONCE DAILY 10 mL 0  . levothyroxine (SYNTHROID, LEVOTHROID) 88 MCG tablet TAKE 1 TABLET BY MOUTH EVERY DAY 90 tablet 3  . metFORMIN (GLUCOPHAGE) 1000 MG tablet TAKE 1 TABLET BY MOUTH TWICE DAILY WITH A MEAL 60 tablet 11  . MICRONIZED COLESTIPOL HCL 1 G tablet TAKE 1 TABLET(1 GRAM) BY MOUTH TWICE DAILY 180 tablet 0  . Multiple Vitamin (MULTIVITAMIN WITH MINERALS) TABS tablet Take 1 tablet by mouth daily.    . NONFORMULARY OR COMPOUNDED ITEM Allergy Vaccine 1:10 Given at [facility]    . potassium chloride SA (K-DUR,KLOR-CON) 20 MEQ tablet Take 2 tablets (40 mEq total) by mouth daily. 14 tablet 0  . pravastatin (PRAVACHOL) 40 MG tablet TAKE 1/2 TABLET BY MOUTH EVERY EVENING 30 tablet 3  . saccharomyces boulardii (FLORASTOR) 250 MG capsule Take 1 capsule (250 mg total) by mouth 2 (two) times daily. 60 capsule 0  . tiotropium (SPIRIVA) 18 MCG inhalation capsule Place 18 mcg into inhaler and inhale daily.    . valsartan (DIOVAN) 160 MG tablet TAKE 1 TABLET BY MOUTH DAILY 30 tablet 11  . amoxicillin-clavulanate (AUGMENTIN) 875-125 MG tablet Take 1 tablet by mouth 2 (two) times daily. 14 tablet 0  . clonazePAM (KLONOPIN)  1 MG tablet TAKE 1 TO 2 TABLETS BY MOUTH AT BEDTIME AS NEEDED FOR ANXIETY 60 tablet 3  . donepezil (ARICEPT) 10 MG tablet Take 1 tablet (10 mg total) by mouth at bedtime. 30 tablet 11  . furosemide (LASIX) 80 MG tablet Take 1 tablet (80 mg total) by mouth daily. 30 tablet 11  . memantine (NAMENDA XR) 28 MG CP24 24 hr capsule Take 1 capsule (28 mg total) by mouth daily. 30 capsule 11  . QUEtiapine (SEROQUEL) 25 MG tablet Take 1-2 tablets at bedtime. (Patient not taking: Reported on 01/03/2016) 60 tablet 0  . risperiDONE (RISPERDAL) 0.5 MG tablet One to 2 tabs po qhs (Patient not taking: Reported on 01/03/2016) 60 tablet 3   No facility-administered medications prior to visit.    ROS Review of Systems    Constitutional: Positive for fatigue. Negative for chills, activity change, appetite change and unexpected weight change.  HENT: Negative for congestion, mouth sores and sinus pressure.   Eyes: Negative for visual disturbance.  Respiratory: Negative for cough and chest tightness.   Gastrointestinal: Negative for nausea and abdominal pain.  Genitourinary: Negative for frequency, difficulty urinating and vaginal pain.  Musculoskeletal: Negative for back pain and gait problem.  Skin: Negative for pallor and rash.  Neurological: Negative for dizziness, tremors, weakness, numbness and headaches.  Psychiatric/Behavioral: Positive for behavioral problems, confusion, sleep disturbance, dysphoric mood, decreased concentration and agitation. The patient is nervous/anxious.     Objective:  BP 118/60 mmHg  Pulse 60  Wt 189 lb (85.73 kg)  SpO2 96%  BP Readings from Last 3 Encounters:  01/03/16 118/60  12/26/15 130/68  10/30/15 150/67    Wt Readings from Last 3 Encounters:  01/03/16 189 lb (85.73 kg)  12/26/15 188 lb (85.276 kg)  09/16/15 186 lb (84.369 kg)    Physical Exam  Constitutional: She appears well-developed. No distress.  HENT:  Head: Normocephalic.  Right Ear: External ear normal.  Left Ear: External ear normal.  Nose: Nose normal.  Mouth/Throat: Oropharynx is clear and moist.  Eyes: Conjunctivae are normal. Pupils are equal, round, and reactive to light. Right eye exhibits no discharge. Left eye exhibits no discharge.  Neck: Normal range of motion. Neck supple. No JVD present. No tracheal deviation present. No thyromegaly present.  Cardiovascular: Normal rate, regular rhythm and normal heart sounds.   Pulmonary/Chest: No stridor. No respiratory distress. She has no wheezes.  Abdominal: Soft. Bowel sounds are normal. She exhibits no distension and no mass. There is no tenderness. There is no rebound and no guarding.  Musculoskeletal: She exhibits no edema or tenderness.   Lymphadenopathy:    She has no cervical adenopathy.  Neurological: She displays normal reflexes. No cranial nerve deficit. She exhibits normal muscle tone. Coordination normal.  Skin: No rash noted. No erythema.  Psychiatric: She has a normal mood and affect. Her behavior is normal. Judgment and thought content normal.    Lab Results  Component Value Date   WBC 8.5 10/30/2015   HGB 12.8 10/30/2015   HCT 38.6 10/30/2015   PLT 210 10/30/2015   GLUCOSE 58* 10/30/2015   CHOL 232* 06/21/2015   TRIG 160* 06/21/2015   HDL 35* 06/21/2015   LDLDIRECT 130.4 11/02/2013   LDLCALC 165* 06/21/2015   ALT 13* 06/21/2015   AST 21 06/21/2015   NA 142 10/30/2015   K 3.4* 10/30/2015   CL 102 10/30/2015   CREATININE 0.87 10/30/2015   BUN 13 10/30/2015   CO2 32 10/30/2015  TSH 0.66 01/29/2015   HGBA1C 6.6* 06/21/2015   MICROALBUR 6.3* 05/10/2013    Ct Head Wo Contrast  10/30/2015  CLINICAL DATA:  Status post fall in the bathroom.  Hit her head. EXAM: CT HEAD WITHOUT CONTRAST TECHNIQUE: Contiguous axial images were obtained from the base of the skull through the vertex without intravenous contrast. COMPARISON:  MR brain 06/21/2015 FINDINGS: There is no evidence of mass effect, midline shift, or extra-axial fluid collections. There is no evidence of a space-occupying lesion or intracranial hemorrhage. There is no evidence of a cortical-based area of acute infarction. There is generalized cerebral atrophy. There is periventricular white matter low attenuation likely secondary to microangiopathy. The ventricles and sulci are appropriate for the patient's age. The basal cisterns are patent. Visualized portions of the orbits are unremarkable. The visualized portions of the paranasal sinuses and mastoid air cells are unremarkable. Cerebrovascular atherosclerotic calcifications are noted. The osseous structures are unremarkable. IMPRESSION: 1. No acute intracranial pathology. 2. Chronic microvascular disease  and cerebral atrophy. Electronically Signed   By: Elige Ko   On: 10/30/2015 18:02   Dg Knee Complete 4 Views Left  10/30/2015  CLINICAL DATA:  Tried to stand from sitting on the toilet and was unable to do so, went to knees, complaining of knee pain, was on knees for 10 minutes, hypertension, diabetes mellitus EXAM: LEFT KNEE - COMPLETE 4+ VIEW COMPARISON:  None FINDINGS: Osseous demineralization. Diffuse joint space narrowing and marginal spur formation. Chondrocalcinosis question CPPD. No acute fracture, dislocation or bone destruction. No knee joint effusion. Scattered atherosclerotic calcifications. Few benign-appearing soft tissue calcifications anteriorly at the LEFT leg question tiny phleboliths. IMPRESSION: Osseous demineralization with osteoarthritic changes LEFT knee. No acute abnormality. Electronically Signed   By: Ulyses Southward M.D.   On: 10/30/2015 16:20   Dg Knee Complete 4 Views Right  10/30/2015  CLINICAL DATA:  Fall in bathroom. Knee injury and pain. Initial encounter. EXAM: RIGHT KNEE - COMPLETE 4+ VIEW COMPARISON:  None. FINDINGS: There is no evidence of fracture, dislocation, or joint effusion. Moderate tricompartmental osteoarthritis noted. Generalized osteopenia seen as well as peripheral vascular calcification. IMPRESSION: No acute findings. Moderate tricompartmental osteoarthritis. Electronically Signed   By: Myles Rosenthal M.D.   On: 10/30/2015 16:21    Assessment & Plan:   There are no diagnoses linked to this encounter. I have discontinued Ms. Stupka's donepezil, memantine, QUEtiapine, risperiDONE, clonazePAM, and amoxicillin-clavulanate. I am also having her start on Memantine HCl-Donepezil HCl. Additionally, I am having her maintain her fish oil-omega-3 fatty acids, multivitamin with minerals, albuterol, tiotropium, valsartan, metFORMIN, levothyroxine, saccharomyces boulardii, MICRONIZED COLESTIPOL HCL, azelastine, acetaminophen, BIOTIN PO, potassium chloride SA,  hyoscyamine, glycopyrrolate, NONFORMULARY OR COMPOUNDED ITEM, Insulin Syringe-Needle U-100, clopidogrel, budesonide-formoterol, colestipol, ACCU-CHEK AVIVA PLUS, pravastatin, LANTUS, HYDROcodone-acetaminophen, furosemide, and hydrOXYzine.  Meds ordered this encounter  Medications  . DISCONTD: hydrOXYzine (ATARAX/VISTARIL) 50 MG tablet    Sig: Take 1-2 tablets (50-100 mg total) by mouth 3 (three) times daily as needed for anxiety.    Dispense:  120 tablet    Refill:  5  . Memantine HCl-Donepezil HCl (NAMZARIC) 28-10 MG CP24    Sig: Take 1 tablet by mouth daily.    Dispense:  30 capsule    Refill:  11  . hydrOXYzine (ATARAX/VISTARIL) 50 MG tablet    Sig: Take 1-2 tablets (50-100 mg total) by mouth 3 (three) times daily as needed for anxiety.    Dispense:  120 tablet    Refill:  5  Follow-up: Return in about 6 weeks (around 02/14/2016) for a follow-up visit.  Sonda Primes, MD

## 2016-01-05 DIAGNOSIS — F0391 Unspecified dementia with behavioral disturbance: Secondary | ICD-10-CM | POA: Insufficient documentation

## 2016-01-05 DIAGNOSIS — F0392 Unspecified dementia, unspecified severity, with psychotic disturbance: Secondary | ICD-10-CM | POA: Insufficient documentation

## 2016-01-05 NOTE — Assessment & Plan Note (Signed)
Namzaric Hydroxyzine Intolerant of atypical antipsychotics, benzodiazepines

## 2016-01-05 NOTE — Assessment & Plan Note (Signed)
Namzaric Hydroxyzine Intolerant of atypical antipsychotics, benzodiazepines  

## 2016-01-05 NOTE — Assessment & Plan Note (Signed)
Imodium AD prn 

## 2016-01-10 ENCOUNTER — Other Ambulatory Visit: Payer: Self-pay | Admitting: Internal Medicine

## 2016-01-14 DIAGNOSIS — Z1231 Encounter for screening mammogram for malignant neoplasm of breast: Secondary | ICD-10-CM | POA: Diagnosis not present

## 2016-01-14 LAB — HM MAMMOGRAPHY

## 2016-01-15 ENCOUNTER — Encounter: Payer: Self-pay | Admitting: Internal Medicine

## 2016-01-21 ENCOUNTER — Telehealth: Payer: Self-pay | Admitting: Internal Medicine

## 2016-01-21 NOTE — Telephone Encounter (Signed)
Pt's DIL, Martie Lee called stating at her last appointment you were going to get her some aid to come and sit with her some days. She hasn't heard anything and she was given some paperwork to fill out for this.  She's not sure what she needs to do now. Can you please call her at [phone]

## 2016-01-22 NOTE — Telephone Encounter (Signed)
Calling back in regards.

## 2016-01-22 NOTE — Telephone Encounter (Signed)
Pt needs to get paperwork to [facility]- (see the top part of the paperwork) Thx

## 2016-01-23 NOTE — Telephone Encounter (Signed)
[name] informed to bring paperwork to our office for completion.

## 2016-01-30 ENCOUNTER — Emergency Department (HOSPITAL_COMMUNITY)
Admission: EM | Admit: 2016-01-30 | Discharge: 2016-01-30 | Disposition: A | Discharge status: Home / Self Care | Payer: Medicare Other | Attending: Emergency Medicine | Admitting: Emergency Medicine

## 2016-01-30 ENCOUNTER — Encounter (HOSPITAL_COMMUNITY): Payer: Self-pay | Admitting: *Deleted

## 2016-01-30 DIAGNOSIS — J449 Chronic obstructive pulmonary disease, unspecified: Secondary | ICD-10-CM | POA: Insufficient documentation

## 2016-01-30 DIAGNOSIS — R110 Nausea: Secondary | ICD-10-CM | POA: Insufficient documentation

## 2016-01-30 DIAGNOSIS — E119 Type 2 diabetes mellitus without complications: Secondary | ICD-10-CM | POA: Diagnosis not present

## 2016-01-30 DIAGNOSIS — I10 Essential (primary) hypertension: Secondary | ICD-10-CM | POA: Insufficient documentation

## 2016-01-30 LAB — COMPREHENSIVE METABOLIC PANEL
ALBUMIN: 3.5 g/dL (ref 3.5–5.0)
ALK PHOS: 60 U/L (ref 38–126)
ALT: 13 U/L — AB (ref 14–54)
AST: 19 U/L (ref 15–41)
Anion gap: 11 (ref 5–15)
BUN: 13 mg/dL (ref 6–20)
CALCIUM: 10.3 mg/dL (ref 8.9–10.3)
CO2: 28 mmol/L (ref 22–32)
CREATININE: 0.96 mg/dL (ref 0.44–1.00)
Chloride: 104 mmol/L (ref 101–111)
GFR calc Af Amer: >60 mL/min (ref >60)
GFR calc non Af Amer: 58 mL/min — ABNORMAL LOW (ref >60)
GLUCOSE: 179 mg/dL — AB (ref 65–99)
Potassium: 3.2 mmol/L — ABNORMAL LOW (ref 3.5–5.1)
SODIUM: 143 mmol/L (ref 135–145)
Total Bilirubin: 1 mg/dL (ref 0.3–1.2)
Total Protein: 6.5 g/dL (ref 6.5–8.1)

## 2016-01-30 LAB — LIPASE, BLOOD: Lipase: 32 U/L (ref 11–51)

## 2016-01-30 LAB — CBC
HCT: 38.9 % (ref 36.0–46.0)
HEMOGLOBIN: 12.7 g/dL (ref 12.0–15.0)
MCH: 29.7 pg (ref 26.0–34.0)
MCHC: 32.6 g/dL (ref 30.0–36.0)
MCV: 91.1 fL (ref 78.0–100.0)
Platelets: 232 10*3/uL (ref 150–400)
RBC: 4.27 MIL/uL (ref 3.87–5.11)
RDW: 13 % (ref 11.5–15.5)
WBC: 7 10*3/uL (ref 4.0–10.5)

## 2016-01-30 LAB — CBG MONITORING, ED: GLUCOSE-CAPILLARY: 178 mg/dL — AB (ref 65–99)

## 2016-01-30 MED ORDER — ONDANSETRON 4 MG PO TBDP
4.0000 mg | ORAL_TABLET | Freq: Once | ORAL | Status: AC | PRN
  Administered 2016-01-30: 4 mg via ORAL

## 2016-01-30 MED ORDER — ONDANSETRON 4 MG PO TBDP
ORAL_TABLET | ORAL | Status: AC
  Filled 2016-01-30: qty 1

## 2016-01-30 NOTE — ED Notes (Signed)
Pt reports nausea that began approx 10:30 this morning after eating a [employer] apple pie, pt states "I just feel sick like I have medicine in my mouth" - pt denies vomiting, diarrhea or abd pain.

## 2016-01-30 NOTE — ED Notes (Signed)
Pt noted to have irregular pulse on assessment, pt denies any hx of cardiac arrhythmias, EKG performed in triage. Pt denies chest pain at present.

## 2016-02-03 DIAGNOSIS — J449 Chronic obstructive pulmonary disease, unspecified: Secondary | ICD-10-CM | POA: Diagnosis not present

## 2016-02-03 DIAGNOSIS — R32 Unspecified urinary incontinence: Secondary | ICD-10-CM | POA: Diagnosis not present

## 2016-02-03 DIAGNOSIS — F0390 Unspecified dementia without behavioral disturbance: Secondary | ICD-10-CM | POA: Diagnosis not present

## 2016-02-12 ENCOUNTER — Telehealth: Payer: Self-pay | Admitting: Internal Medicine

## 2016-02-12 NOTE — Telephone Encounter (Signed)
This will be a viral infection "a cold". Antibiotics at this stage won't help. Suggest throat lozenges and otc comfort meds for cold and flu symptoms.

## 2016-02-12 NOTE — Telephone Encounter (Signed)
Spoke with pt's daughter, [name]  She states that pt is c/o rhinitis and sore throat x 2 days  No wheezing, cough, SOB, chest tightness or pain, f/c/s  I advised she may need to contact PCP, or make appt here since she has not been seen here since 2014  She verbalized understanding, but still wants to see what Dr Maple Hudson would rec  Please advise, thanks! Allergies  Allergen Reactions  . Gabapentin Other (See Comments)    Rxed in ER; not tolerated  . Lipitor [Atorvastatin] Other (See Comments)    Urticaria   . Zocor [Simvastatin] Other (See Comments)    Urticaria   . Clonazepam     hallucinations  . Risperdal [Risperidone]     Insane   . Seroquel [Quetiapine Fumarate]     "insane"  . Aspirin Itching  . Codeine Itching  . Demerol Itching    Unknown-unconscious.  . Latex Itching  . Lisinopril Itching and Cough   Current Outpatient Prescriptions on File Prior to Visit  Medication Sig Dispense Refill  . ACCU-CHEK AVIVA PLUS test strip USE TO TEST BLOOD SUGAR 3-4 TIMES A DAY 100 each 0  . ACCU-CHEK FASTCLIX LANCETS MISC USE AS DIRECTED 102 each 5  . acetaminophen (TYLENOL) 500 MG tablet Take 500 mg by mouth every 6 (six) hours as needed for mild pain.     Marland Kitchen albuterol (PROVENTIL HFA;VENTOLIN HFA) 108 (90 BASE) MCG/ACT inhaler Inhale 2 puffs into the lungs every 6 (six) hours as needed for wheezing or shortness of breath.    Marland Kitchen azelastine (ASTELIN) 0.1 % nasal spray INSERT 1 TO 2 SPRAYS INTO EACH NOSTRIL ONCE OR TWICE DAILY AS NEEDED 30 mL 0  . BIOTIN PO Take 1 capsule by mouth daily.    . budesonide-formoterol (SYMBICORT) 80-4.5 MCG/ACT inhaler Inhale 2 puffs into the lungs 2 (two) times daily. 10.2 g 3  . clopidogrel (PLAVIX) 75 MG tablet TAKE 1 TABLET BY MOUTH EVERY DAY 30 tablet 1  . colestipol (COLESTID) 1 G tablet TAKE 1 TABLET(1 GRAM) BY MOUTH TWICE DAILY 60 tablet 3  . fish oil-omega-3 fatty acids 1000 MG capsule Take 2 g by mouth 2 (two) times daily.     . furosemide (LASIX)  40 MG tablet TAKE 1 AND 1/2 TABLET BY MOUTH EVERY DAY 45 tablet 2  . glycopyrrolate (ROBINUL) 2 MG tablet Take 1 tablet (2 mg total) by mouth 2 (two) times daily. 60 tablet 3  . HYDROcodone-acetaminophen (NORCO/VICODIN) 5-325 MG tablet Take 1 tablet by mouth every 6 (six) hours as needed for severe pain. 120 tablet 0  . hydrOXYzine (ATARAX/VISTARIL) 50 MG tablet Take 1-2 tablets (50-100 mg total) by mouth 3 (three) times daily as needed for anxiety. 120 tablet 5  . hyoscyamine (LEVSIN SL) 0.125 MG SL tablet Place 2 tablets (0.25 mg total) under the tongue every 4 (four) hours as needed. (Patient taking differently: Place 0.25 mg under the tongue every 4 (four) hours as needed for cramping. ) 30 tablet 0  . Insulin Syringe-Needle U-100 29G X 7/16" 1 ML MISC Use to administer insulin daily. DX E11.09 100 each 3  . LANTUS 100 UNIT/ML injection INJECT 40 UNITS INTO THE SKIN ONCE DAILY 10 mL 0  . levothyroxine (SYNTHROID, LEVOTHROID) 88 MCG tablet TAKE 1 TABLET BY MOUTH EVERY DAY 90 tablet 3  . Memantine HCl-Donepezil HCl (NAMZARIC) 28-10 MG CP24 Take 1 tablet by mouth daily. 30 capsule 11  . metFORMIN (GLUCOPHAGE) 1000 MG tablet TAKE 1 TABLET  BY MOUTH TWICE DAILY WITH A MEAL 60 tablet 11  . MICRONIZED COLESTIPOL HCL 1 G tablet TAKE 1 TABLET(1 GRAM) BY MOUTH TWICE DAILY 180 tablet 0  . Multiple Vitamin (MULTIVITAMIN WITH MINERALS) TABS tablet Take 1 tablet by mouth daily.    . NONFORMULARY OR COMPOUNDED ITEM Allergy Vaccine 1:10 Given at [facility]    . potassium chloride SA (K-DUR,KLOR-CON) 20 MEQ tablet Take 2 tablets (40 mEq total) by mouth daily. 14 tablet 0  . pravastatin (PRAVACHOL) 40 MG tablet TAKE 1/2 TABLET BY MOUTH EVERY EVENING 30 tablet 3  . saccharomyces boulardii (FLORASTOR) 250 MG capsule Take 1 capsule (250 mg total) by mouth 2 (two) times daily. 60 capsule 0  . tiotropium (SPIRIVA) 18 MCG inhalation capsule Place 18 mcg into inhaler and inhale daily.    . valsartan (DIOVAN) 160  MG tablet TAKE 1 TABLET BY MOUTH DAILY 30 tablet 11  . [DISCONTINUED] Calcium Carbonate-Vitamin D (CALCIUM 600/VITAMIN D) 600-400 MG-UNIT per tablet Take 1 tablet by mouth 2 (two) times daily.      . [DISCONTINUED] sodium chloride (OCEAN) 0.65 % nasal spray Place 2 sprays into the nose as needed. TO OPEN NASAL BREATHING     No current facility-administered medications on file prior to visit.

## 2016-02-12 NOTE — Telephone Encounter (Signed)
Spoke with pt's daughter, aware of recs.  Nothing further needed.   

## 2016-02-14 ENCOUNTER — Ambulatory Visit: Payer: Medicare Other | Admitting: Internal Medicine

## 2016-02-17 ENCOUNTER — Ambulatory Visit: Payer: Medicare Other | Admitting: Internal Medicine

## 2016-02-19 ENCOUNTER — Ambulatory Visit (INDEPENDENT_AMBULATORY_CARE_PROVIDER_SITE_OTHER): Payer: Medicare Other | Admitting: Podiatry

## 2016-02-19 ENCOUNTER — Encounter: Payer: Self-pay | Admitting: Podiatry

## 2016-02-19 VITALS — BP 114/88 | HR 69 | Resp 12

## 2016-02-19 DIAGNOSIS — M79675 Pain in left toe(s): Secondary | ICD-10-CM

## 2016-02-19 DIAGNOSIS — B351 Tinea unguium: Secondary | ICD-10-CM

## 2016-02-19 DIAGNOSIS — M79674 Pain in right toe(s): Secondary | ICD-10-CM | POA: Diagnosis not present

## 2016-02-19 NOTE — Patient Instructions (Signed)
Diabetes and Foot Care Diabetes may cause you to have problems because of poor blood supply (circulation) to your feet and legs. This may cause the skin on your feet to become thinner, break easier, and heal more slowly. Your skin may become dry, and the skin may peel and crack. You may also have nerve damage in your legs and feet causing decreased feeling in them. You may not notice minor injuries to your feet that could lead to infections or more serious problems. Taking care of your feet is one of the most important things you can do for yourself.  HOME CARE INSTRUCTIONS  Wear shoes at all times, even in the house. Do not go barefoot. Bare feet are easily injured.  Check your feet daily for blisters, cuts, and redness. If you cannot see the bottom of your feet, use a mirror or ask someone for help.  Wash your feet with warm water (do not use hot water) and mild soap. Then pat your feet and the areas between your toes until they are completely dry. Do not soak your feet as this can dry your skin.  Apply a moisturizing lotion or petroleum jelly (that does not contain alcohol and is unscented) to the skin on your feet and to dry, brittle toenails. Do not apply lotion between your toes.  Trim your toenails straight across. Do not dig under them or around the cuticle. File the edges of your nails with an emery board or nail file.  Do not cut corns or calluses or try to remove them with medicine.  Wear clean socks or stockings every day. Make sure they are not too tight. Do not wear knee-high stockings since they may decrease blood flow to your legs.  Wear shoes that fit properly and have enough cushioning. To break in new shoes, wear them for just a few hours a day. This prevents you from injuring your feet. Always look in your shoes before you put them on to be sure there are no objects inside.  Do not cross your legs. This may decrease the blood flow to your feet.  If you find a minor scrape,  cut, or break in the skin on your feet, keep it and the skin around it clean and dry. These areas may be cleansed with mild soap and water. Do not cleanse the area with peroxide, alcohol, or iodine.  When you remove an adhesive bandage, be sure not to damage the skin around it.  If you have a wound, look at it several times a day to make sure it is healing.  Do not use heating pads or hot water bottles. They may burn your skin. If you have lost feeling in your feet or legs, you may not know it is happening until it is too late.  Make sure your health care provider performs a complete foot exam at least annually or more often if you have foot problems. Report any cuts, sores, or bruises to your health care provider immediately. SEEK MEDICAL CARE IF:   You have an injury that is not healing.  You have cuts or breaks in the skin.  You have an ingrown nail.  You notice redness on your legs or feet.  You feel burning or tingling in your legs or feet.  You have pain or cramps in your legs and feet.  Your legs or feet are numb.  Your feet always feel cold. SEEK IMMEDIATE MEDICAL CARE IF:   There is increasing redness,   swelling, or pain in or around a wound.  There is a red line that goes up your leg.  Pus is coming from a wound.  You develop a fever or as directed by your health care provider.  You notice a bad smell coming from an ulcer or wound.   This information is not intended to replace advice given to you by your health care provider. Make sure you discuss any questions you have with your health care provider.   Document Released: 11/13/2000 Document Revised: 07/19/2013 Document Reviewed: 04/25/2013 Elsevier Interactive Patient Education 2016 Elsevier Inc.  

## 2016-02-19 NOTE — Progress Notes (Signed)
   Subjective:    Patient ID: [name], female    DOB: 11/09/1946, 70 y.o.   MRN: [id number]  HPI  This patient presents today with her daughter present in the treatment room who is speaking in behalf of her mother. The daughter states that her mother's toenails have been gradually thickening over the past year and her mother is complaining of discomfort in the toenails and walking wearing shoes and her daughter is requesting debridement of these toenails. Daughter is attempted to trim toenails, however, was unable to do so herself  Review of Systems  Eyes: Positive for pain, redness, itching and visual disturbance.  Musculoskeletal: Positive for gait problem.  Psychiatric/Behavioral: Positive for hallucinations and confusion.       Objective:   Physical Exam  Patient is pleasant, however is confused and not able to answer questions without the assistance of her daughter  Vascular: DP pulses 2/4 right and 0/4 left PT pulses once/4 bilaterally Capillary reflex immediate bilaterally  Neurological: Patient not able to respond to 10 g monofilament wire Patient not able to respond to vibratory sensation Ankle reflex equal and reactive bilaterally  Dermatological: No open skin lesions bilaterally Hallux toenails are absent bilaterally Surgical scars dorsal first MPJ second toe area Toenails 2-5, bilaterally are elongated, brittle, deformed and tender direct palpation  Musculoskeletal: Pes planus bilaterally Overlapping second left toe Hammertoe third bilaterally Limited range of motion ankle, subtalar, midtarsal joints bilaterally       Assessment & Plan:   Assessment: Confused non-orientated patient 3 Decrease pedal pulses without open skin lesions Symptomatic onychomycoses 8  Plan: Today I reviewed the results with the patient or patient's daughter and offered them debridement of the toenails. The daughter verbally consents for her mother. Toenails 8 are  debrided mechanically and legs without any bleeding  Reappoint 4 months

## 2016-02-28 ENCOUNTER — Telehealth: Payer: Self-pay | Admitting: Internal Medicine

## 2016-02-28 NOTE — Telephone Encounter (Signed)
Is it a covered benefit for her? Thx

## 2016-02-28 NOTE — Telephone Encounter (Signed)
Pt called request referral for the CNA to come and help her with some of her ADL. Please advise, and call her back # [phone]

## 2016-03-02 NOTE — Telephone Encounter (Signed)
Spoke to [name] (dtr in Social workerlaw). Martie LeeSabrina stated that CNA services are sending a fax for us to review and PCP to sign. Informed Martie LeeSabrina that usually CNA is a private pay for the pt. Will wait until we have the forms to review and verify that pt has coverage and that PCP needs to sign orders.

## 2016-03-04 NOTE — Telephone Encounter (Signed)
Have you rec'd anything from [facility] for this pt?    Best number [phone] daughter in law

## 2016-03-05 NOTE — Telephone Encounter (Signed)
I'm not aware Thx

## 2016-03-09 NOTE — Telephone Encounter (Signed)
Isabella PingDahlia do you have these forms?

## 2016-03-09 NOTE — Telephone Encounter (Signed)
Spoke with daughter in law again.  States [facility] tried to fax to three faxes and could not get to go through.  They have mailed the forms.

## 2016-03-09 NOTE — Telephone Encounter (Signed)
I have not received any forms regarding this patient

## 2016-03-11 NOTE — Telephone Encounter (Signed)
Paper work received from [employer]. [facility] to [facility] out.

## 2016-03-11 NOTE — Telephone Encounter (Signed)
Paperwork completed and placed on MD's desk for signature 

## 2016-03-12 NOTE — Telephone Encounter (Signed)
Paperwork signed, faxed, copy sent to scan 

## 2016-03-17 ENCOUNTER — Telehealth: Payer: Self-pay

## 2016-03-17 NOTE — Telephone Encounter (Signed)
Daughter in law called back stating the [facility] [phone] stated they never received the paperwork. Can paperwork please resent again? Call daughter in law @ [phone]. She did not have fax number.

## 2016-03-17 NOTE — Telephone Encounter (Signed)
[name] states that she needs a [id number] form filled out. Could you please follow up with her.

## 2016-03-18 NOTE — Telephone Encounter (Signed)
She states that [facility] told her that they would not be able to provide all of the services pt requires. Daughter-in-law is cancelling request for [facility] and will be requesting [facility].  [id number] is a [employer] information [employer] managerersonal Care Attestation Form.  [facility] and [facility] to [facility] [phone]

## 2016-03-18 NOTE — Telephone Encounter (Signed)
Re-faxed form, called daughter-in-law to advise. She states that [facility] told her that they would not be able to provide all of the services pt requires.  Daughter-in-law is cancelling request for [facility] and will be requesting [facility]

## 2016-03-20 ENCOUNTER — Telehealth: Payer: Self-pay | Admitting: Internal Medicine

## 2016-03-20 MED ORDER — BUDESONIDE-FORMOTEROL FUMARATE 80-4.5 MCG/ACT IN AERO: 2.0000 | INHALATION_SPRAY | Freq: Two times a day (BID) | RESPIRATORY_TRACT | Status: AC

## 2016-03-20 MED ORDER — AMOXICILLIN 500 MG PO CAPS: 500.0000 mg | ORAL_CAPSULE | Freq: Three times a day (TID) | ORAL | Status: DC

## 2016-03-20 MED ORDER — TIOTROPIUM BROMIDE MONOHYDRATE 18 MCG IN CAPS: 18.0000 ug | ORAL_CAPSULE | Freq: Every day | RESPIRATORY_TRACT | Status: DC

## 2016-03-20 MED ORDER — AZELASTINE HCL 0.1 % NA SOLN: NASAL | Status: DC

## 2016-03-20 NOTE — Telephone Encounter (Signed)
Spoke with pt's daughter, rx resent to pharmacy.  Nothing further needed.

## 2016-03-20 NOTE — Telephone Encounter (Signed)
Offer amoxacillin 500 mg, # 21, 1 three times daily  Ok to refill requested meds for 1 year  Make a ROV appointment sometime this year.

## 2016-03-20 NOTE — Telephone Encounter (Signed)
Spoke with pt's daughter in law, [place].  Pt c/o sinus congestion, sinus drainage (green-yellow), low grade temp 99, sinus headaches.  Also requesting refills on Spiriva, symbicort and astelin. Pt last seen 11/12/14.  No upcoming appt scheduled.  Please advise.  Allergies  Allergen Reactions  . Gabapentin Other (See Comments)    Rxed in ER; not tolerated  . Lipitor [Atorvastatin] Other (See Comments)    Urticaria   . Zocor [Simvastatin] Other (See Comments)    Urticaria   . Clonazepam     hallucinations  . Risperdal [Risperidone]     Insane   . Seroquel [Quetiapine Fumarate]     "insane"  . Aspirin Itching  . Codeine Itching  . Demerol Itching    Unknown-unconscious.  . Latex Itching  . Lisinopril Itching and Cough    Current Outpatient Prescriptions on File Prior to Visit  Medication Sig Dispense Refill  . ACCU-CHEK AVIVA PLUS test strip USE TO TEST BLOOD SUGAR 3-4 TIMES A DAY 100 each 0  . ACCU-CHEK FASTCLIX LANCETS MISC USE AS DIRECTED 102 each 5  . acetaminophen (TYLENOL) 500 MG tablet Take 500 mg by mouth every 6 (six) hours as needed for mild pain.     Marland Kitchen albuterol (PROVENTIL HFA;VENTOLIN HFA) 108 (90 BASE) MCG/ACT inhaler Inhale 2 puffs into the lungs every 6 (six) hours as needed for wheezing or shortness of breath.    Marland Kitchen azelastine (ASTELIN) 0.1 % nasal spray INSERT 1 TO 2 SPRAYS INTO EACH NOSTRIL ONCE OR TWICE DAILY AS NEEDED 30 mL 0  . BIOTIN PO Take 1 capsule by mouth daily.    . budesonide-formoterol (SYMBICORT) 80-4.5 MCG/ACT inhaler Inhale 2 puffs into the lungs 2 (two) times daily. 10.2 g 3  . clopidogrel (PLAVIX) 75 MG tablet TAKE 1 TABLET BY MOUTH EVERY DAY 30 tablet 1  . colestipol (COLESTID) 1 G tablet TAKE 1 TABLET(1 GRAM) BY MOUTH TWICE DAILY 60 tablet 3  . fish oil-omega-3 fatty acids 1000 MG capsule Take 2 g by mouth 2 (two) times daily.     . furosemide (LASIX) 40 MG tablet TAKE 1 AND 1/2 TABLET BY MOUTH EVERY DAY 45 tablet 2  . glycopyrrolate  (ROBINUL) 2 MG tablet Take 1 tablet (2 mg total) by mouth 2 (two) times daily. 60 tablet 3  . HYDROcodone-acetaminophen (NORCO/VICODIN) 5-325 MG tablet Take 1 tablet by mouth every 6 (six) hours as needed for severe pain. 120 tablet 0  . hydrOXYzine (ATARAX/VISTARIL) 50 MG tablet Take 1-2 tablets (50-100 mg total) by mouth 3 (three) times daily as needed for anxiety. 120 tablet 5  . hyoscyamine (LEVSIN SL) 0.125 MG SL tablet Place 2 tablets (0.25 mg total) under the tongue every 4 (four) hours as needed. (Patient taking differently: Place 0.25 mg under the tongue every 4 (four) hours as needed for cramping. ) 30 tablet 0  . Insulin Syringe-Needle U-100 29G X 7/16" 1 ML MISC Use to administer insulin daily. DX E11.09 100 each 3  . LANTUS 100 UNIT/ML injection INJECT 40 UNITS INTO THE SKIN ONCE DAILY 10 mL 0  . levothyroxine (SYNTHROID, LEVOTHROID) 88 MCG tablet TAKE 1 TABLET BY MOUTH EVERY DAY 90 tablet 3  . Memantine HCl-Donepezil HCl (NAMZARIC) 28-10 MG CP24 Take 1 tablet by mouth daily. 30 capsule 11  . metFORMIN (GLUCOPHAGE) 1000 MG tablet TAKE 1 TABLET BY MOUTH TWICE DAILY WITH A MEAL 60 tablet 11  . MICRONIZED COLESTIPOL HCL 1 G tablet TAKE 1 TABLET(1 GRAM) BY MOUTH TWICE  DAILY 180 tablet 0  . Multiple Vitamin (MULTIVITAMIN WITH MINERALS) TABS tablet Take 1 tablet by mouth daily.    . NONFORMULARY OR COMPOUNDED ITEM Allergy Vaccine 1:10 Given at [facility]    . potassium chloride SA (K-DUR,KLOR-CON) 20 MEQ tablet Take 2 tablets (40 mEq total) by mouth daily. 14 tablet 0  . pravastatin (PRAVACHOL) 40 MG tablet TAKE 1/2 TABLET BY MOUTH EVERY EVENING 30 tablet 3  . saccharomyces boulardii (FLORASTOR) 250 MG capsule Take 1 capsule (250 mg total) by mouth 2 (two) times daily. 60 capsule 0  . tiotropium (SPIRIVA) 18 MCG inhalation capsule Place 18 mcg into inhaler and inhale daily.    . valsartan (DIOVAN) 160 MG tablet TAKE 1 TABLET BY MOUTH DAILY 30 tablet 11  . [DISCONTINUED] Calcium  Carbonate-Vitamin D (CALCIUM 600/VITAMIN D) 600-400 MG-UNIT per tablet Take 1 tablet by mouth 2 (two) times daily.      . [DISCONTINUED] sodium chloride (OCEAN) 0.65 % nasal spray Place 2 sprays into the nose as needed. TO OPEN NASAL BREATHING     No current facility-administered medications on file prior to visit.

## 2016-03-20 NOTE — Telephone Encounter (Signed)
Spoke with pt's daughter in law, aware of recs.  Martie LeeSabrina states that they do not have a ride right now, but will have to coordinate a ride and call back to schedule an ov.  I stressed the importance of scheduling an ov since pt has not been seen since 2015.  [name] expressed understanding.  rx's sent to pharmacy.  Nothing further needed.

## 2016-03-23 ENCOUNTER — Other Ambulatory Visit: Payer: Self-pay | Admitting: *Deleted

## 2016-03-23 MED ORDER — METFORMIN HCL 1000 MG PO TABS: 1000.0000 mg | ORAL_TABLET | Freq: Two times a day (BID) | ORAL | Status: AC

## 2016-03-23 MED ORDER — VALSARTAN 160 MG PO TABS: 160.0000 mg | ORAL_TABLET | Freq: Every day | ORAL | Status: AC

## 2016-03-25 ENCOUNTER — Ambulatory Visit (INDEPENDENT_AMBULATORY_CARE_PROVIDER_SITE_OTHER)
Admission: RE | Admit: 2016-03-25 | Discharge: 2016-03-25 | Disposition: A | Discharge status: Home / Self Care | Payer: Medicare Other | Source: Ambulatory Visit | Attending: Internal Medicine | Admitting: Internal Medicine

## 2016-03-25 ENCOUNTER — Ambulatory Visit (INDEPENDENT_AMBULATORY_CARE_PROVIDER_SITE_OTHER): Payer: Medicare Other | Admitting: Internal Medicine

## 2016-03-25 ENCOUNTER — Encounter: Payer: Self-pay | Admitting: Internal Medicine

## 2016-03-25 ENCOUNTER — Other Ambulatory Visit (INDEPENDENT_AMBULATORY_CARE_PROVIDER_SITE_OTHER): Payer: Medicare Other

## 2016-03-25 VITALS — BP 130/80 | HR 67 | Ht 63.0 in | Wt 178.8 lb

## 2016-03-25 DIAGNOSIS — J45909 Unspecified asthma, uncomplicated: Secondary | ICD-10-CM

## 2016-03-25 DIAGNOSIS — F0280 Dementia in other diseases classified elsewhere without behavioral disturbance: Secondary | ICD-10-CM

## 2016-03-25 DIAGNOSIS — Z794 Long term (current) use of insulin: Secondary | ICD-10-CM

## 2016-03-25 DIAGNOSIS — R0602 Shortness of breath: Secondary | ICD-10-CM | POA: Diagnosis not present

## 2016-03-25 DIAGNOSIS — M5440 Lumbago with sciatica, unspecified side: Secondary | ICD-10-CM

## 2016-03-25 DIAGNOSIS — IMO0002 Reserved for concepts with insufficient information to code with codable children: Secondary | ICD-10-CM

## 2016-03-25 DIAGNOSIS — F22 Delusional disorders: Principal | ICD-10-CM

## 2016-03-25 DIAGNOSIS — E538 Deficiency of other specified B group vitamins: Secondary | ICD-10-CM

## 2016-03-25 DIAGNOSIS — E1121 Type 2 diabetes mellitus with diabetic nephropathy: Secondary | ICD-10-CM

## 2016-03-25 DIAGNOSIS — E1165 Type 2 diabetes mellitus with hyperglycemia: Secondary | ICD-10-CM

## 2016-03-25 DIAGNOSIS — R05 Cough: Secondary | ICD-10-CM

## 2016-03-25 DIAGNOSIS — G300 Alzheimer's disease with early onset: Secondary | ICD-10-CM

## 2016-03-25 DIAGNOSIS — R059 Cough, unspecified: Secondary | ICD-10-CM

## 2016-03-25 DIAGNOSIS — J449 Chronic obstructive pulmonary disease, unspecified: Secondary | ICD-10-CM

## 2016-03-25 DIAGNOSIS — F0282 Dementia in other diseases classified elsewhere, unspecified severity, with psychotic disturbance: Secondary | ICD-10-CM

## 2016-03-25 LAB — BASIC METABOLIC PANEL
BUN: 22 mg/dL (ref 6–23)
CALCIUM: 10.1 mg/dL (ref 8.4–10.5)
CO2: 32 meq/L (ref 19–32)
Chloride: 101 mEq/L (ref 96–112)
Creatinine, Ser: 1.08 mg/dL (ref 0.40–1.20)
GFR: 52.98 mL/min — ABNORMAL LOW (ref >60.00)
GLUCOSE: 185 mg/dL — AB (ref 70–99)
Potassium: 3.7 mEq/L (ref 3.5–5.1)
SODIUM: 140 meq/L (ref 135–145)

## 2016-03-25 LAB — CBC WITH DIFFERENTIAL/PLATELET
Basophils Absolute: 0 10*3/uL (ref 0.0–0.1)
Basophils Relative: 0.4 % (ref 0.0–3.0)
EOS PCT: 0.8 % (ref 0.0–5.0)
Eosinophils Absolute: 0.1 10*3/uL (ref 0.0–0.7)
HEMATOCRIT: 38.9 % (ref 36.0–46.0)
HEMOGLOBIN: 13.1 g/dL (ref 12.0–15.0)
LYMPHS PCT: 25.4 % (ref 12.0–46.0)
Lymphs Abs: 2.5 10*3/uL (ref 0.7–4.0)
MCHC: 33.7 g/dL (ref 30.0–36.0)
MCV: 91.2 fl (ref 78.0–100.0)
MONOS PCT: 7 % (ref 3.0–12.0)
Monocytes Absolute: 0.7 10*3/uL (ref 0.1–1.0)
Neutro Abs: 6.5 10*3/uL (ref 1.4–7.7)
Neutrophils Relative %: 66.4 % (ref 43.0–77.0)
Platelets: 254 10*3/uL (ref 150.0–400.0)
RBC: 4.26 Mil/uL (ref 3.87–5.11)
RDW: 12.8 % (ref 11.5–15.5)
WBC: 9.8 10*3/uL (ref 4.0–10.5)

## 2016-03-25 LAB — HEPATIC FUNCTION PANEL
ALBUMIN: 3.8 g/dL (ref 3.5–5.2)
ALK PHOS: 78 U/L (ref 39–117)
ALT: 9 U/L (ref 0–35)
AST: 14 U/L (ref 0–37)
Bilirubin, Direct: 0.1 mg/dL (ref 0.0–0.3)
TOTAL PROTEIN: 6.9 g/dL (ref 6.0–8.3)
Total Bilirubin: 0.7 mg/dL (ref 0.2–1.2)

## 2016-03-25 LAB — TSH: TSH: 10.4 u[IU]/mL — ABNORMAL HIGH (ref 0.35–4.50)

## 2016-03-25 LAB — VITAMIN B12: Vitamin B-12: 317 pg/mL (ref 211–911)

## 2016-03-25 MED ORDER — HYDROCODONE-ACETAMINOPHEN 5-325 MG PO TABS: 1.0000 | ORAL_TABLET | Freq: Four times a day (QID) | ORAL | Status: DC | PRN

## 2016-03-25 MED ORDER — TRAZODONE HCL 50 MG PO TABS: 50.0000 mg | ORAL_TABLET | Freq: Every day | ORAL | Status: DC

## 2016-03-25 NOTE — Progress Notes (Signed)
Pre visit review using our clinic review tool, if applicable. No additional management support is needed unless otherwise documented below in the visit note. 

## 2016-03-25 NOTE — Assessment & Plan Note (Signed)
Hydroxyzine. 4/17 added Trazodone

## 2016-03-25 NOTE — Assessment & Plan Note (Signed)
Cough - check CXR

## 2016-03-25 NOTE — Assessment & Plan Note (Addendum)
Norco prn - reduce dose if possible  Potential benefits of a long term opioids use as well as potential risks (i.e. addiction risk, apnea etc) and complications (i.e. Somnolence, constipation and others) were explained to the patient and were aknowledged.

## 2016-03-25 NOTE — Assessment & Plan Note (Signed)
CBGs are >200 on Lantus 30 u/d. Pt is not compliant w/diet. Increase Lantus by 2 u/d up to 40u/day

## 2016-03-25 NOTE — Progress Notes (Signed)
Subjective:  Patient ID: [name], female    DOB: 01-Sep-1946  Age: 70 y.o. MRN: [id number]  CC: Follow-up   HPI   [name] presents for hypothyroidism, dementia, LBP f/u C/o cough - Dr Maple Hudson gave the pt Amoxicillin C/o her mother (deceased) throwing things at me, hurts me C/o CBGs are >200 on Lantus 30 u/d. Pt is not compliant w/diet  Outpatient Prescriptions Prior to Visit  Medication Sig Dispense Refill  . ACCU-CHEK AVIVA PLUS test strip USE TO TEST BLOOD SUGAR 3-4 TIMES A DAY 100 each 0  . ACCU-CHEK FASTCLIX LANCETS MISC USE AS DIRECTED 102 each 5  . acetaminophen (TYLENOL) 500 MG tablet Take 500 mg by mouth every 6 (six) hours as needed for mild pain.     Marland Kitchen albuterol (PROVENTIL HFA;VENTOLIN HFA) 108 (90 BASE) MCG/ACT inhaler Inhale 2 puffs into the lungs every 6 (six) hours as needed for wheezing or shortness of breath.    Marland Kitchen amoxicillin (AMOXIL) 500 MG capsule Take 1 capsule (500 mg total) by mouth 3 (three) times daily. 21 capsule 0  . azelastine (ASTELIN) 0.1 % nasal spray INSERT 1 TO 2 SPRAYS INTO EACH NOSTRIL ONCE OR TWICE DAILY AS NEEDED 30 mL 11  . BIOTIN PO Take 1 capsule by mouth daily.    . budesonide-formoterol (SYMBICORT) 80-4.5 MCG/ACT inhaler Inhale 2 puffs into the lungs 2 (two) times daily. 10.2 g 11  . clopidogrel (PLAVIX) 75 MG tablet TAKE 1 TABLET BY MOUTH EVERY DAY 30 tablet 1  . colestipol (COLESTID) 1 G tablet TAKE 1 TABLET(1 GRAM) BY MOUTH TWICE DAILY 60 tablet 3  . fish oil-omega-3 fatty acids 1000 MG capsule Take 2 g by mouth 2 (two) times daily.     . furosemide (LASIX) 40 MG tablet TAKE 1 AND 1/2 TABLET BY MOUTH EVERY DAY 45 tablet 2  . glycopyrrolate (ROBINUL) 2 MG tablet Take 1 tablet (2 mg total) by mouth 2 (two) times daily. 60 tablet 3  . HYDROcodone-acetaminophen (NORCO/VICODIN) 5-325 MG tablet Take 1 tablet by mouth every 6 (six) hours as needed for severe pain. 120 tablet 0  . hydrOXYzine (ATARAX/VISTARIL) 50 MG tablet Take 1-2  tablets (50-100 mg total) by mouth 3 (three) times daily as needed for anxiety. 120 tablet 5  . hyoscyamine (LEVSIN SL) 0.125 MG SL tablet Place 2 tablets (0.25 mg total) under the tongue every 4 (four) hours as needed. (Patient taking differently: Place 0.25 mg under the tongue every 4 (four) hours as needed for cramping. ) 30 tablet 0  . Insulin Syringe-Needle U-100 29G X 7/16" 1 ML MISC Use to administer insulin daily. DX E11.09 100 each 3  . LANTUS 100 UNIT/ML injection INJECT 40 UNITS INTO THE SKIN ONCE DAILY 10 mL 0  . levothyroxine (SYNTHROID, LEVOTHROID) 88 MCG tablet TAKE 1 TABLET BY MOUTH EVERY DAY 90 tablet 3  . metFORMIN (GLUCOPHAGE) 1000 MG tablet Take 1 tablet (1,000 mg total) by mouth 2 (two) times daily with a meal. 60 tablet 11  . MICRONIZED COLESTIPOL HCL 1 G tablet TAKE 1 TABLET(1 GRAM) BY MOUTH TWICE DAILY 180 tablet 0  . Multiple Vitamin (MULTIVITAMIN WITH MINERALS) TABS tablet Take 1 tablet by mouth daily.    . NONFORMULARY OR COMPOUNDED ITEM Allergy Vaccine 1:10 Given at [facility]    . potassium chloride SA (K-DUR,KLOR-CON) 20 MEQ tablet Take 2 tablets (40 mEq total) by mouth daily. 14 tablet 0  . pravastatin (PRAVACHOL) 40 MG tablet TAKE 1/2  TABLET BY MOUTH EVERY EVENING 30 tablet 3  . saccharomyces boulardii (FLORASTOR) 250 MG capsule Take 1 capsule (250 mg total) by mouth 2 (two) times daily. 60 capsule 0  . tiotropium (SPIRIVA) 18 MCG inhalation capsule Place 1 capsule (18 mcg total) into inhaler and inhale daily. 30 capsule 11  . valsartan (DIOVAN) 160 MG tablet Take 1 tablet (160 mg total) by mouth daily. 30 tablet 11  . Memantine HCl-Donepezil HCl (NAMZARIC) 28-10 MG CP24 Take 1 tablet by mouth daily. (Patient not taking: Reported on 03/25/2016) 30 capsule 11   No facility-administered medications prior to visit.    ROS Review of Systems  Constitutional: Negative for chills, activity change, appetite change, fatigue and unexpected weight change.  HENT:  Negative for congestion, mouth sores and sinus pressure.   Eyes: Negative for visual disturbance.  Respiratory: Negative for cough and chest tightness.   Gastrointestinal: Negative for nausea and abdominal pain.  Genitourinary: Negative for frequency, difficulty urinating and vaginal pain.  Musculoskeletal: Negative for back pain and gait problem.  Skin: Negative for pallor and rash.  Neurological: Negative for dizziness, tremors, weakness, numbness and headaches.  Psychiatric/Behavioral: Positive for hallucinations, confusion, sleep disturbance and decreased concentration. Negative for suicidal ideas, dysphoric mood and agitation. The patient is nervous/anxious.     Objective:  BP 130/80 mmHg  Pulse 67  Ht 5\' 3"  (1.6 m)  Wt 178 lb 12 oz (81.08 kg)  BMI 31.67 kg/m2  SpO2 92%  BP Readings from Last 3 Encounters:  03/25/16 130/80  02/19/16 114/88  01/30/16 175/59    Wt Readings from Last 3 Encounters:  03/25/16 178 lb 12 oz (81.08 kg)  01/03/16 189 lb (85.73 kg)  12/26/15 188 lb (85.276 kg)    Physical Exam  Constitutional: She appears well-developed. No distress.  HENT:  Head: Normocephalic.  Right Ear: External ear normal.  Left Ear: External ear normal.  Nose: Nose normal.  Mouth/Throat: Oropharynx is clear and moist.  Eyes: Conjunctivae are normal. Pupils are equal, round, and reactive to light. Right eye exhibits no discharge. Left eye exhibits no discharge.  Neck: Normal range of motion. Neck supple. No JVD present. No tracheal deviation present. No thyromegaly present.  Cardiovascular: Normal rate, regular rhythm and normal heart sounds.   Pulmonary/Chest: No stridor. No respiratory distress. She has no wheezes.  Abdominal: Soft. Bowel sounds are normal. She exhibits no distension and no mass. There is no tenderness. There is no rebound and no guarding.  Musculoskeletal: She exhibits no edema or tenderness.  Lymphadenopathy:    She has no cervical adenopathy.    Neurological: She displays normal reflexes. No cranial nerve deficit. She exhibits normal muscle tone. Coordination abnormal.  Skin: No rash noted. No erythema.  confused, hallucinating Obese  Lab Results  Component Value Date   WBC 7.0 01/30/2016   HGB 12.7 01/30/2016   HCT 38.9 01/30/2016   PLT 232 01/30/2016   GLUCOSE 179* 01/30/2016   CHOL 232* 06/21/2015   TRIG 160* 06/21/2015   HDL 35* 06/21/2015   LDLDIRECT 130.4 11/02/2013   LDLCALC 165* 06/21/2015   ALT 13* 01/30/2016   AST 19 01/30/2016   NA 143 01/30/2016   K 3.2* 01/30/2016   CL 104 01/30/2016   CREATININE 0.96 01/30/2016   BUN 13 01/30/2016   CO2 28 01/30/2016   TSH 0.66 01/29/2015   HGBA1C 6.6* 06/21/2015   MICROALBUR 6.3* 05/10/2013    No results found.  Assessment & Plan:   There are no  diagnoses linked to this encounter. I am having Ms. Earl Lites maintain her fish oil-omega-3 fatty acids, multivitamin with minerals, albuterol, levothyroxine, saccharomyces boulardii, MICRONIZED COLESTIPOL HCL, acetaminophen, BIOTIN PO, potassium chloride SA, hyoscyamine, glycopyrrolate, NONFORMULARY OR COMPOUNDED ITEM, Insulin Syringe-Needle U-100, clopidogrel, colestipol, ACCU-CHEK AVIVA PLUS, pravastatin, LANTUS, HYDROcodone-acetaminophen, furosemide, Memantine HCl-Donepezil HCl, hydrOXYzine, ACCU-CHEK FASTCLIX LANCETS, azelastine, budesonide-formoterol, tiotropium, amoxicillin, metFORMIN, and valsartan.  No orders of the defined types were placed in this encounter.     Follow-up: No Follow-up on file.  Sonda Primes, MD

## 2016-03-25 NOTE — Assessment & Plan Note (Signed)
On po B12 

## 2016-03-26 ENCOUNTER — Other Ambulatory Visit: Payer: Self-pay | Admitting: Internal Medicine

## 2016-03-26 MED ORDER — LEVOTHYROXINE SODIUM 100 MCG PO TABS: 100.0000 ug | ORAL_TABLET | Freq: Every day | ORAL | Status: AC

## 2016-04-29 ENCOUNTER — Other Ambulatory Visit: Payer: Self-pay | Admitting: Internal Medicine

## 2016-04-30 ENCOUNTER — Other Ambulatory Visit: Payer: Self-pay | Admitting: *Deleted

## 2016-04-30 NOTE — Telephone Encounter (Signed)
Daughter left msg on triage stating needing new rx for Accu-chek strips. Per chart refill was sent back this am by MD assistant...Raechel Chute/lmb

## 2016-05-04 DIAGNOSIS — J449 Chronic obstructive pulmonary disease, unspecified: Secondary | ICD-10-CM | POA: Diagnosis not present

## 2016-05-04 DIAGNOSIS — R32 Unspecified urinary incontinence: Secondary | ICD-10-CM | POA: Diagnosis not present

## 2016-05-04 DIAGNOSIS — F0390 Unspecified dementia without behavioral disturbance: Secondary | ICD-10-CM | POA: Diagnosis not present

## 2016-06-01 ENCOUNTER — Telehealth: Payer: Self-pay | Admitting: Internal Medicine

## 2016-06-01 NOTE — Telephone Encounter (Signed)
Pt daughter called in and is stating that pt needs more home health.  She is going to have the info faxed over.  She also wants cancel the pain meds because she says pt is not taking them and

## 2016-06-03 ENCOUNTER — Encounter: Payer: Self-pay | Admitting: Podiatry

## 2016-06-03 ENCOUNTER — Ambulatory Visit (INDEPENDENT_AMBULATORY_CARE_PROVIDER_SITE_OTHER): Payer: Medicare Other | Admitting: Podiatry

## 2016-06-03 DIAGNOSIS — M79675 Pain in left toe(s): Secondary | ICD-10-CM

## 2016-06-03 DIAGNOSIS — M79674 Pain in right toe(s): Secondary | ICD-10-CM | POA: Diagnosis not present

## 2016-06-03 DIAGNOSIS — B351 Tinea unguium: Secondary | ICD-10-CM | POA: Diagnosis not present

## 2016-06-03 NOTE — Telephone Encounter (Signed)
FL2's and other documentation for [place] [employer] received via email. Sending to dahlia for completion. Advised caller that dr plotnikov would not be back until next week

## 2016-06-04 NOTE — Patient Instructions (Signed)
Diabetes and Foot Care Diabetes may cause you to have problems because of poor blood supply (circulation) to your feet and legs. This may cause the skin on your feet to become thinner, break easier, and heal more slowly. Your skin may become dry, and the skin may peel and crack. You may also have nerve damage in your legs and feet causing decreased feeling in them. You may not notice minor injuries to your feet that could lead to infections or more serious problems. Taking care of your feet is one of the most important things you can do for yourself.  HOME CARE INSTRUCTIONS  Wear shoes at all times, even in the house. Do not go barefoot. Bare feet are easily injured.  Check your feet daily for blisters, cuts, and redness. If you cannot see the bottom of your feet, use a mirror or ask someone for help.  Wash your feet with warm water (do not use hot water) and mild soap. Then pat your feet and the areas between your toes until they are completely dry. Do not soak your feet as this can dry your skin.  Apply a moisturizing lotion or petroleum jelly (that does not contain alcohol and is unscented) to the skin on your feet and to dry, brittle toenails. Do not apply lotion between your toes.  Trim your toenails straight across. Do not dig under them or around the cuticle. File the edges of your nails with an emery board or nail file.  Do not cut corns or calluses or try to remove them with medicine.  Wear clean socks or stockings every day. Make sure they are not too tight. Do not wear knee-high stockings since they may decrease blood flow to your legs.  Wear shoes that fit properly and have enough cushioning. To break in new shoes, wear them for just a few hours a day. This prevents you from injuring your feet. Always look in your shoes before you put them on to be sure there are no objects inside.  Do not cross your legs. This may decrease the blood flow to your feet.  If you find a minor scrape,  cut, or break in the skin on your feet, keep it and the skin around it clean and dry. These areas may be cleansed with mild soap and water. Do not cleanse the area with peroxide, alcohol, or iodine.  When you remove an adhesive bandage, be sure not to damage the skin around it.  If you have a wound, look at it several times a day to make sure it is healing.  Do not use heating pads or hot water bottles. They may burn your skin. If you have lost feeling in your feet or legs, you may not know it is happening until it is too late.  Make sure your health care provider performs a complete foot exam at least annually or more often if you have foot problems. Report any cuts, sores, or bruises to your health care provider immediately. SEEK MEDICAL CARE IF:   You have an injury that is not healing.  You have cuts or breaks in the skin.  You have an ingrown nail.  You notice redness on your legs or feet.  You feel burning or tingling in your legs or feet.  You have pain or cramps in your legs and feet.  Your legs or feet are numb.  Your feet always feel cold. SEEK IMMEDIATE MEDICAL CARE IF:   There is increasing redness,   swelling, or pain in or around a wound.  There is a red line that goes up your leg.  Pus is coming from a wound.  You develop a fever or as directed by your health care provider.  You notice a bad smell coming from an ulcer or wound.   This information is not intended to replace advice given to you by your health care provider. Make sure you discuss any questions you have with your health care provider.   Document Released: 11/13/2000 Document Revised: 07/19/2013 Document Reviewed: 04/25/2013 Elsevier Interactive Patient Education 2016 Elsevier Inc.  

## 2016-06-04 NOTE — Progress Notes (Signed)
Patient ID: [name], female   DOB: 09/03/1946, 70 y.o.   MRN: [id number]  Subjective:  This patient presents today with her daughter present in the treatment room who is speaking in behalf of her mother. The daughter states that her mother's toenails have been gradually thickening over the past year and her mother is complaining of discomfort in the toenails and walking wearing shoes and her daughter is requesting debridement of these toenails. Daughter is attempted to trim toenails, however, was unable to do so herself  Objective: Patient is pleasant, however is confused and not able to answer questions without the assistance of her daughter  Vascular: DP pulses 2/4 right and 0/4 left PT pulses once/4 bilaterally Capillary reflex immediate bilaterally  Neurological: Patient not able to respond to 10 g monofilament wire Patient not able to respond to vibratory sensation Ankle reflex equal and reactive bilaterally  Dermatological: No open skin lesions bilaterally Hallux toenails are absent bilaterally Surgical scars dorsal first MPJ second toe area Toenails 2-5, bilaterally are elongated, brittle, deformed and tender direct palpation  Musculoskeletal: Pes planus bilaterally Overlapping second left toe Hammertoe third bilaterally Limited range of motion ankle, subtalar, midtarsal joints bilaterally  Assessment: Type II diabetic with renal manifestations Confused non-orientated patient 3 Decrease pedal pulses without open skin lesions Symptomatic onychomycoses 8  Plan:  Toenails 8 are debrided mechanically and electrically without any bleeding  Reappoint 4 months

## 2016-06-04 NOTE — Telephone Encounter (Signed)
Paperwork partially completed and forwarded to Dr Plotnikov's assistant to give to him once he returns to office 06/08/2016

## 2016-06-08 ENCOUNTER — Ambulatory Visit: Payer: Medicare Other

## 2016-06-09 NOTE — Telephone Encounter (Signed)
PCP completed some. Form given to [facility] part.

## 2016-06-09 NOTE — Telephone Encounter (Signed)
Paperwork signed by PCP, completed, copy sent to scan.  Pt's daughter advised that original available for pick up at office via personal VM, advised that son's number was incorrect.

## 2016-06-09 NOTE — Telephone Encounter (Signed)
Misty StanleyStacey, could you please follow up with AVP on this? Thanks

## 2016-06-09 NOTE — Telephone Encounter (Signed)
Pt is checking up on this paper work. Please help

## 2016-06-10 ENCOUNTER — Ambulatory Visit (INDEPENDENT_AMBULATORY_CARE_PROVIDER_SITE_OTHER): Payer: Medicare Other

## 2016-06-10 DIAGNOSIS — Z111 Encounter for screening for respiratory tuberculosis: Secondary | ICD-10-CM

## 2016-06-12 LAB — TB SKIN TEST
Induration: 0 mm
TB Skin Test: NEGATIVE

## 2016-06-15 ENCOUNTER — Telehealth: Payer: Self-pay | Admitting: Internal Medicine

## 2016-06-15 NOTE — Telephone Encounter (Signed)
Patients daughter, Ellard ArtisRebecca McGee called in wanting to know [facility].  Daughter can be reached at [phone].

## 2016-06-16 NOTE — Telephone Encounter (Signed)
FL2 faxed again with a list of patient's meds hand written out and 03/25/16 [facility]ax # [phone].

## 2016-06-16 NOTE — Telephone Encounter (Signed)
Copies are upfront for p/u. Left detailed mess informing pt/daughter.

## 2016-06-17 ENCOUNTER — Telehealth: Payer: Self-pay

## 2016-06-17 NOTE — Telephone Encounter (Signed)
FL2 signed, updated and [facility]

## 2016-06-17 NOTE — Telephone Encounter (Signed)
OK 32 units (40 u was reflecting up titration if needed) Thx

## 2016-06-17 NOTE — Telephone Encounter (Signed)
[facility] on pt's Lantus.  Daughter states MD advised pt to take 32 units but medication list has 40 units. Please advise, thanks!

## 2016-06-22 ENCOUNTER — Telehealth: Payer: Self-pay

## 2016-06-22 ENCOUNTER — Ambulatory Visit: Payer: Medicare Other | Admitting: Internal Medicine

## 2016-06-22 DIAGNOSIS — J449 Chronic obstructive pulmonary disease, unspecified: Secondary | ICD-10-CM | POA: Diagnosis not present

## 2016-06-22 DIAGNOSIS — R32 Unspecified urinary incontinence: Secondary | ICD-10-CM | POA: Diagnosis not present

## 2016-06-22 DIAGNOSIS — F0390 Unspecified dementia without behavioral disturbance: Secondary | ICD-10-CM | POA: Diagnosis not present

## 2016-06-22 NOTE — Telephone Encounter (Signed)
Lurena Joiner the patients daughter called said that she had to cancel the app today because her mother is going to visit nursing home. She also states that if her sister in law called to refill the pain medication. DO NOT. Her mother does not take this medication. They take it and sale it. That her mother was removed from the home. She just wanted to make she you all was aware of what was going on. Thank you. IF you have any question please call Lurena Joiner.

## 2016-06-22 NOTE — Addendum Note (Signed)
Addended by: Tresa Garter on: 06/22/2016 10:45 AM   Modules accepted: Orders

## 2016-06-24 ENCOUNTER — Ambulatory Visit: Payer: Medicare Other | Admitting: Podiatry

## 2016-06-25 ENCOUNTER — Other Ambulatory Visit: Payer: Self-pay | Admitting: Internal Medicine

## 2016-06-29 DIAGNOSIS — G300 Alzheimer's disease with early onset: Secondary | ICD-10-CM | POA: Diagnosis not present

## 2016-06-29 DIAGNOSIS — E034 Atrophy of thyroid (acquired): Secondary | ICD-10-CM | POA: Diagnosis not present

## 2016-06-29 DIAGNOSIS — E1165 Type 2 diabetes mellitus with hyperglycemia: Secondary | ICD-10-CM | POA: Diagnosis not present

## 2016-06-29 DIAGNOSIS — E782 Mixed hyperlipidemia: Secondary | ICD-10-CM | POA: Diagnosis not present

## 2016-06-29 DIAGNOSIS — F0281 Dementia in other diseases classified elsewhere with behavioral disturbance: Secondary | ICD-10-CM | POA: Diagnosis not present

## 2016-07-15 DIAGNOSIS — I670 Dissection of cerebral arteries, nonruptured: Secondary | ICD-10-CM | POA: Diagnosis not present

## 2016-07-15 DIAGNOSIS — E119 Type 2 diabetes mellitus without complications: Secondary | ICD-10-CM | POA: Diagnosis not present

## 2016-07-15 DIAGNOSIS — E0590 Thyrotoxicosis, unspecified without thyrotoxic crisis or storm: Secondary | ICD-10-CM | POA: Diagnosis not present

## 2016-07-27 DIAGNOSIS — F0280 Dementia in other diseases classified elsewhere without behavioral disturbance: Secondary | ICD-10-CM | POA: Diagnosis not present

## 2016-07-27 DIAGNOSIS — E1165 Type 2 diabetes mellitus with hyperglycemia: Secondary | ICD-10-CM | POA: Diagnosis not present

## 2016-07-27 DIAGNOSIS — G300 Alzheimer's disease with early onset: Secondary | ICD-10-CM | POA: Diagnosis not present

## 2016-07-28 DIAGNOSIS — Z961 Presence of intraocular lens: Secondary | ICD-10-CM | POA: Diagnosis not present

## 2016-07-28 DIAGNOSIS — E119 Type 2 diabetes mellitus without complications: Secondary | ICD-10-CM | POA: Diagnosis not present

## 2016-08-04 DIAGNOSIS — E1165 Type 2 diabetes mellitus with hyperglycemia: Secondary | ICD-10-CM | POA: Diagnosis not present

## 2016-08-04 DIAGNOSIS — G300 Alzheimer's disease with early onset: Secondary | ICD-10-CM | POA: Diagnosis not present

## 2016-08-10 DIAGNOSIS — G300 Alzheimer's disease with early onset: Secondary | ICD-10-CM | POA: Diagnosis not present

## 2016-08-24 DIAGNOSIS — R2681 Unsteadiness on feet: Secondary | ICD-10-CM | POA: Diagnosis not present

## 2016-08-24 DIAGNOSIS — E1165 Type 2 diabetes mellitus with hyperglycemia: Secondary | ICD-10-CM | POA: Diagnosis not present

## 2016-08-24 DIAGNOSIS — R079 Chest pain, unspecified: Secondary | ICD-10-CM | POA: Diagnosis not present

## 2016-08-24 DIAGNOSIS — G300 Alzheimer's disease with early onset: Secondary | ICD-10-CM | POA: Diagnosis not present

## 2016-09-05 ENCOUNTER — Emergency Department (HOSPITAL_COMMUNITY): Payer: Medicare Other

## 2016-09-05 ENCOUNTER — Emergency Department (HOSPITAL_COMMUNITY)
Admission: EM | Admit: 2016-09-05 | Discharge: 2016-09-05 | Disposition: A | Discharge status: Home / Self Care | Payer: Medicare Other | Attending: Physician Assistant | Admitting: Physician Assistant

## 2016-09-05 ENCOUNTER — Encounter (HOSPITAL_COMMUNITY): Payer: Self-pay | Admitting: Emergency Medicine

## 2016-09-05 DIAGNOSIS — Y939 Activity, unspecified: Secondary | ICD-10-CM | POA: Diagnosis not present

## 2016-09-05 DIAGNOSIS — M542 Cervicalgia: Secondary | ICD-10-CM | POA: Diagnosis not present

## 2016-09-05 DIAGNOSIS — F0281 Dementia in other diseases classified elsewhere with behavioral disturbance: Secondary | ICD-10-CM | POA: Insufficient documentation

## 2016-09-05 DIAGNOSIS — W010XXA Fall on same level from slipping, tripping and stumbling without subsequent striking against object, initial encounter: Secondary | ICD-10-CM | POA: Insufficient documentation

## 2016-09-05 DIAGNOSIS — M546 Pain in thoracic spine: Secondary | ICD-10-CM | POA: Diagnosis not present

## 2016-09-05 DIAGNOSIS — Y929 Unspecified place or not applicable: Secondary | ICD-10-CM | POA: Diagnosis not present

## 2016-09-05 DIAGNOSIS — Y999 Unspecified external cause status: Secondary | ICD-10-CM | POA: Diagnosis not present

## 2016-09-05 DIAGNOSIS — Z87891 Personal history of nicotine dependence: Secondary | ICD-10-CM | POA: Insufficient documentation

## 2016-09-05 DIAGNOSIS — S0990XA Unspecified injury of head, initial encounter: Secondary | ICD-10-CM | POA: Diagnosis not present

## 2016-09-05 DIAGNOSIS — Z7984 Long term (current) use of oral hypoglycemic drugs: Secondary | ICD-10-CM | POA: Insufficient documentation

## 2016-09-05 DIAGNOSIS — R6889 Other general symptoms and signs: Secondary | ICD-10-CM | POA: Diagnosis not present

## 2016-09-05 DIAGNOSIS — Z794 Long term (current) use of insulin: Secondary | ICD-10-CM | POA: Insufficient documentation

## 2016-09-05 DIAGNOSIS — J449 Chronic obstructive pulmonary disease, unspecified: Secondary | ICD-10-CM | POA: Insufficient documentation

## 2016-09-05 DIAGNOSIS — Z91040 Latex allergy status: Secondary | ICD-10-CM | POA: Insufficient documentation

## 2016-09-05 DIAGNOSIS — W19XXXA Unspecified fall, initial encounter: Secondary | ICD-10-CM

## 2016-09-05 DIAGNOSIS — E119 Type 2 diabetes mellitus without complications: Secondary | ICD-10-CM | POA: Diagnosis not present

## 2016-09-05 DIAGNOSIS — I10 Essential (primary) hypertension: Secondary | ICD-10-CM | POA: Diagnosis not present

## 2016-09-05 DIAGNOSIS — G300 Alzheimer's disease with early onset: Secondary | ICD-10-CM | POA: Diagnosis not present

## 2016-09-05 DIAGNOSIS — J45909 Unspecified asthma, uncomplicated: Secondary | ICD-10-CM | POA: Diagnosis not present

## 2016-09-05 DIAGNOSIS — E039 Hypothyroidism, unspecified: Secondary | ICD-10-CM | POA: Diagnosis not present

## 2016-09-05 DIAGNOSIS — S299XXA Unspecified injury of thorax, initial encounter: Secondary | ICD-10-CM | POA: Diagnosis not present

## 2016-09-05 DIAGNOSIS — Z79899 Other long term (current) drug therapy: Secondary | ICD-10-CM | POA: Diagnosis not present

## 2016-09-05 DIAGNOSIS — S199XXA Unspecified injury of neck, initial encounter: Secondary | ICD-10-CM | POA: Diagnosis not present

## 2016-09-05 NOTE — ED Notes (Signed)
Pt offered OJ to drink. Son reports he plans to stop by to get pt something to eat on the way home.

## 2016-09-05 NOTE — Discharge Instructions (Signed)
You were seen today after fall. We did not find any evidence of breaks/fracture. If you have any pain, vomiting, concerns, dizziness, weakness please return immediately.

## 2016-09-05 NOTE — ED Triage Notes (Addendum)
Per EMS, patient slipped and fell on the way to the bathroom. No obvious hematoma noted upon assessment. Tenderness to right side of neck initially. Now, patient denies pain. Denies blood thinner use. No loss of consciousness. Patient at neuro baseline per facility staff. [facility] Hx of dementia

## 2016-09-05 NOTE — ED Notes (Signed)
Patient transported to CT 

## 2016-09-05 NOTE — ED Notes (Signed)
Bed: [facility] Expected date:  Expected time:  Means of arrival:  Comments: 

## 2016-09-05 NOTE — ED Provider Notes (Signed)
[facility]-EMERGENCY DEPT Provider Note   CSN: [id number] Arrival date & time: 09/05/16  4098     History   Chief Complaint Chief Complaint  Patient presents with  . Fall    HPI [name] is a 70 y.o. female.  The history is provided by the patient.  Fall  This is a new problem. The current episode started 3 to 5 hours ago. The problem occurs constantly. The problem has not changed since onset.Pertinent negatives include no chest pain. Nothing aggravates the symptoms. Nothing relieves the symptoms. She has tried nothing for the symptoms. The treatment provided no relief.     Patient is a very polite 70 year old female presenting after fall. She reports a mechanical fall. She presents isolated the pain to her neck. Otherwise feels at baseline.  Past Medical History:  Diagnosis Date  . Abnormal stress test 08/21/2008   Neg  . Allergic rhinitis, cause unspecified   . Anxiety and depression   . Arthritis    Right Sacroiliac joint  . Chest pain, unspecified   . Chronic airway obstruction, not elsewhere classified   . GERD (gastroesophageal reflux disease)   . HTN (hypertension)   . Iron deficiency anemia, unspecified   . Other and unspecified hyperlipidemia   . Other B-complex deficiencies   . Other malaise and fatigue   . Personal history of colonic polyps   . TIA (transient ischemic attack)   . Type II or unspecified type diabetes mellitus without mention of complication, not stated as uncontrolled   . Unspecified asthma(493.90)   . Unspecified hypothyroidism     Patient Active Problem List   Diagnosis Date Noted  . 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 LBP (low back pain) 12/26/2015  . Abdominal pain, left lower quadrant 07/11/2015  . Hyperlipidemia 06/21/2015  . TIA (transient ischemic attack) 06/20/2015  . Hypokalemia 06/20/2015  . ARF (acute renal failure) (HCC) 06/20/2015  . Edema 05/29/2015  . Well adult exam 03/13/2015  . Breast pain, right 01/22/2015    . Intertriginous candidiasis 12/12/2014  . Leg swelling 11/29/2014  . Shoulder blade pain 05/11/2014  . Gastroparesis 09/27/2013  . Diarrhea 09/06/2013  . OAB (overactive bladder) 08/23/2012  . Atrophic vaginitis 07/19/2012  . Memory loss 06/09/2012  . 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 Routine general medical examination at a health care facility 06/07/2011  . Allergic rhinitis due to pollen 01/30/2011  . Personal history of colonic polyps 08/12/2009  . ANEMIA, IRON DEFICIENCY 04/23/2009  . DIZZINESS 04/22/2009  . Other malaise and fatigue 12/27/2008  . ANXIETY DEPRESSION 09/08/2008  . CHEST PAIN, LEFT 08/24/2008  . B12 deficiency 05/15/2008  . ARTHRITIS, RIGHT SACROILIAC JOINT 04/11/2008  . 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 Hypothyroidism 11/02/2007  . Uncontrolled type 2 diabetes with renal manifestation (HCC) 11/02/2007  . HYPERLIPIDEMIA 11/02/2007  . Essential hypertension 11/02/2007  . HYSTERECTOMY, HX OF 11/02/2007    Past Surgical History:  Procedure Laterality Date  . ABDOMINAL HYSTERECTOMY  1982   menometorrhagia  . CARPAL TUNNEL RELEASE  03/05/2009   bilateral  . COLONOSCOPY N/A 09/06/2013   Procedure: COLONOSCOPY;  Surgeon: Louis Meckel, MD;  Location: [facility];  Service: Endoscopy;  Laterality: N/A;  . FOOT SURGERY     hammer toe 2004, 2005  . HOT HEMOSTASIS N/A 09/06/2013   Procedure: HOT HEMOSTASIS (ARGON PLASMA COAGULATION/BICAP);  Surgeon: Louis Meckel, MD;  Location: [facility];  Service: Endoscopy;  Laterality: N/A;  OB History    No data available       Home Medications    Prior to Admission medications   Medication Sig Start Date End Date Taking? Authorizing Provider  ACCU-CHEK AVIVA PLUS test strip USE TO TEST BLOOD SUGAR 3-4 TIMES DAILY 04/30/16   Tresa GarterAleksei V Plotnikov, MD  ACCU-CHEK FASTCLIX LANCETS MISC USE AS DIRECTED 01/10/16   Tresa GarterAleksei V Plotnikov, MD  acetaminophen (TYLENOL) 500  MG tablet Take 500 mg by mouth every 6 (six) hours as needed for mild pain.     Historical Provider, MD  albuterol (PROVENTIL HFA;VENTOLIN HFA) 108 (90 BASE) MCG/ACT inhaler Inhale 2 puffs into the lungs every 6 (six) hours as needed for wheezing or shortness of breath.    Historical Provider, MD  amoxicillin (AMOXIL) 500 MG capsule Take 1 capsule (500 mg total) by mouth 3 (three) times daily. 03/20/16   Waymon Budgelinton D Young, MD  azelastine (ASTELIN) 0.1 % nasal spray INSERT 1 TO 2 SPRAYS INTO EACH NOSTRIL ONCE OR TWICE DAILY AS NEEDED 03/20/16   Waymon Budgelinton D Young, MD  BIOTIN PO Take 1 capsule by mouth daily.    Historical Provider, MD  budesonide-formoterol (SYMBICORT) 80-4.5 MCG/ACT inhaler Inhale 2 puffs into the lungs 2 (two) times daily. 03/20/16   Waymon Budgelinton D Young, MD  clopidogrel (PLAVIX) 75 MG tablet TAKE 1 TABLET BY MOUTH EVERY DAY 09/02/15   Tresa GarterAleksei V Plotnikov, MD  colestipol (COLESTID) 1 G tablet TAKE 1 TABLET(1 GRAM) BY MOUTH TWICE DAILY 10/15/15   Lori P Hvozdovic, PA-C  fish oil-omega-3 fatty acids 1000 MG capsule Take 2 g by mouth 2 (two) times daily.     Historical Provider, MD  furosemide (LASIX) 40 MG tablet TAKE 1 AND 1/2 TABLET BY MOUTH EVERY DAY 01/01/16   Tresa GarterAleksei V Plotnikov, MD  glycopyrrolate (ROBINUL) 2 MG tablet Take 1 tablet (2 mg total) by mouth 2 (two) times daily. 07/17/15   Louis Meckelobert D Kaplan, MD  hydrOXYzine (ATARAX/VISTARIL) 50 MG tablet Take 1-2 tablets (50-100 mg total) by mouth 3 (three) times daily as needed for anxiety. 01/03/16   Georgina QuintAleksei V Plotnikov, MD  hyoscyamine (LEVSIN SL) 0.125 MG SL tablet Place 2 tablets (0.25 mg total) under the tongue every 4 (four) hours as needed. Patient taking differently: Place 0.25 mg under the tongue every 4 (four) hours as needed for cramping.  07/11/15   Louis Meckelobert D Kaplan, MD  Insulin Syringe-Needle U-100 29G X 7/16" 1 ML MISC Use to administer insulin daily. DX E11.09 08/19/15   Georgina QuintAleksei V Plotnikov, MD  LANTUS 100 UNIT/ML injection INJECT 40 UNITS  INTO THE SKIN ONCE DAILY 06/25/16   Tresa GarterAleksei V Plotnikov, MD  levothyroxine (SYNTHROID) 100 MCG tablet Take 1 tablet (100 mcg total) by mouth daily before breakfast. 03/26/16   Tresa GarterAleksei V Plotnikov, MD  Memantine HCl-Donepezil HCl (NAMZARIC) 28-10 MG CP24 Take 1 tablet by mouth daily. Patient not taking: Reported on 03/25/2016 01/03/16   Tresa GarterAleksei V Plotnikov, MD  metFORMIN (GLUCOPHAGE) 1000 MG tablet Take 1 tablet (1,000 mg total) by mouth 2 (two) times daily with a meal. 03/23/16   Tresa GarterAleksei V Plotnikov, MD  MICRONIZED COLESTIPOL HCL 1 G tablet TAKE 1 TABLET(1 GRAM) BY MOUTH TWICE DAILY 05/09/15   Lori P Hvozdovic, PA-C  Multiple Vitamin (MULTIVITAMIN WITH MINERALS) TABS tablet Take 1 tablet by mouth daily.    Historical Provider, MD  NONFORMULARY OR COMPOUNDED ITEM Allergy Vaccine 1:10 Given at [facility]    Historical Provider, MD  potassium chloride SA (  K-DUR,KLOR-CON) 20 MEQ tablet Take 2 tablets (40 mEq total) by mouth daily. 06/22/15   Christiane Ha, MD  pravastatin (PRAVACHOL) 40 MG tablet TAKE 1/2 TABLET BY MOUTH EVERY EVENING 11/15/15   Tresa Garter, MD  saccharomyces boulardii (FLORASTOR) 250 MG capsule Take 1 capsule (250 mg total) by mouth 2 (two) times daily. 05/09/15   Lori P Hvozdovic, PA-C  tiotropium (SPIRIVA) 18 MCG inhalation capsule Place 1 capsule (18 mcg total) into inhaler and inhale daily. 03/20/16   Waymon Budge, MD  traZODone (DESYREL) 50 MG tablet Take 1-2 tablets (50-100 mg total) by mouth at bedtime. 03/25/16   Georgina Quint Plotnikov, MD  valsartan (DIOVAN) 160 MG tablet Take 1 tablet (160 mg total) by mouth daily. 03/23/16   Tresa Garter, MD    Family History Family History  Problem Relation Age of Onset  . Hypertension Mother   . Diabetes Mother   . Heart disease Mother     CHF  . Coronary artery disease Mother   . Lung cancer Father   . Hypertension Father   . Diabetes Father   . Liver cancer Father     w/mets  . Prostate cancer Father   . Lung  disease Brother     Agent Orange Related  . Asthma Brother   . Allergies Brother   . Breast cancer Sister   . Colon cancer Neg Hx     Social History Social History  Substance Use Topics  . Smoking status: Former Smoker    Quit date: 06/01/1997  . Smokeless tobacco: Never Used     Comment: Quit 9 years ago.  . Alcohol use No     Allergies   Gabapentin; Lipitor [atorvastatin]; Zocor [simvastatin]; Clonazepam; Risperdal [risperidone]; Seroquel [quetiapine fumarate]; Aspirin; Codeine; Demerol; Latex; and Lisinopril   Review of Systems Review of Systems  Respiratory: Negative for cough.   Cardiovascular: Negative for chest pain.  Genitourinary: Negative for dysuria.  Skin: Negative for rash.  All other systems reviewed and are negative.    Physical Exam Updated Vital Signs BP 185/71 (BP Location: Right Arm)   Pulse 69   Temp 98.2 F (36.8 C) (Oral)   Resp 14   Wt 178 lb (80.7 kg)   SpO2 96%   BMI 31.53 kg/m   Physical Exam  Constitutional: She appears well-developed and well-nourished.  HENT:  Head: Normocephalic and atraumatic.  Eyes: Right eye exhibits no discharge.  Neck:  Very slight tenderness to palpation in the paraspinal area in the T-spine.  Cardiovascular: Normal rate and regular rhythm.   Pulmonary/Chest: Effort normal. No respiratory distress.  Neurological: No cranial nerve deficit. Coordination normal.  Oriented 2, poor historian.  Skin: Skin is warm and dry. She is not diaphoretic.  Psychiatric: She has a normal mood and affect.  Nursing note and vitals reviewed.    ED Treatments / Results  Labs (all labs ordered are listed, but only abnormal results are displayed) Labs Reviewed - No data to display  EKG  EKG Interpretation None       Radiology Dg Thoracic Spine 2 View  Result Date: 09/05/2016 CLINICAL DATA:  Fall with pain.  Initial encounter. EXAM: THORACIC SPINE 2 VIEWS COMPARISON:  Lateral chest x-ray on 03/25/2016 FINDINGS: No  visible acute fracture or subluxation. Stable mild thoracic spondylosis. No bony lesions identified. Stable visible calcified plaque in the thoracic aorta. IMPRESSION: No acute thoracic fracture identified. Electronically Signed   By: Rudene Anda.D.  On: 09/05/2016 09:56   Ct Head Wo Contrast  Result Date: 09/05/2016 CLINICAL DATA:  Patient fell earlier today on the way to the bathroom. No reported loss of consciousness. Dementia. EXAM: CT HEAD WITHOUT CONTRAST CT CERVICAL SPINE WITHOUT CONTRAST TECHNIQUE: Multidetector CT imaging of the head and cervical spine was performed following the standard protocol without intravenous contrast. Multiplanar CT image reconstructions of the cervical spine were also generated. COMPARISON:  CT head 10/30/2015. FINDINGS: CT HEAD FINDINGS Brain: No evidence of acute infarction, hemorrhage, hydrocephalus, extra-axial collection or mass lesion/mass effect. Global atrophy. Chronic microvascular ischemic change. Vascular: Normal for age vascular calcification. No signs of large vessel occlusion. Skull: Normal. Negative for fracture or focal lesion. Sinuses/Orbits: No acute finding. Other: Trace RIGHT mastoid fluid, non worrisome. Similar appearance to priors. CT CERVICAL SPINE FINDINGS Alignment: Straightening of the normal cervical lordosis. Trace subluxation at C3-4 and C4-5 are facet mediated. Skull base and vertebrae: No worrisome osseous lesion. Soft tissues and spinal canal: No canal hematoma or mass. Atherosclerosis. Disc levels: No visible disc protrusion or spinal stenosis. Extensive facet arthropathy at C3-4 and C4-5 on the LEFT. Upper chest: No pneumothorax or visible lung mass. Other: None. IMPRESSION: Chronic changes of brain senescence as described. No skull fracture or intracranial hemorrhage. Cervical spondylosis.  No C-spine fracture or traumatic subluxation. Electronically Signed   By: Elsie Stain M.D.   On: 09/05/2016 10:36   Ct Cervical Spine Wo  Contrast  Result Date: 09/05/2016 CLINICAL DATA:  Patient fell earlier today on the way to the bathroom. No reported loss of consciousness. Dementia. EXAM: CT HEAD WITHOUT CONTRAST CT CERVICAL SPINE WITHOUT CONTRAST TECHNIQUE: Multidetector CT imaging of the head and cervical spine was performed following the standard protocol without intravenous contrast. Multiplanar CT image reconstructions of the cervical spine were also generated. COMPARISON:  CT head 10/30/2015. FINDINGS: CT HEAD FINDINGS Brain: No evidence of acute infarction, hemorrhage, hydrocephalus, extra-axial collection or mass lesion/mass effect. Global atrophy. Chronic microvascular ischemic change. Vascular: Normal for age vascular calcification. No signs of large vessel occlusion. Skull: Normal. Negative for fracture or focal lesion. Sinuses/Orbits: No acute finding. Other: Trace RIGHT mastoid fluid, non worrisome. Similar appearance to priors. CT CERVICAL SPINE FINDINGS Alignment: Straightening of the normal cervical lordosis. Trace subluxation at C3-4 and C4-5 are facet mediated. Skull base and vertebrae: No worrisome osseous lesion. Soft tissues and spinal canal: No canal hematoma or mass. Atherosclerosis. Disc levels: No visible disc protrusion or spinal stenosis. Extensive facet arthropathy at C3-4 and C4-5 on the LEFT. Upper chest: No pneumothorax or visible lung mass. Other: None. IMPRESSION: Chronic changes of brain senescence as described. No skull fracture or intracranial hemorrhage. Cervical spondylosis.  No C-spine fracture or traumatic subluxation. Electronically Signed   By: Elsie Stain M.D.   On: 09/05/2016 10:36    Procedures Procedures (including critical care time)  Medications Ordered in ED Medications - No data to display   Initial Impression / Assessment and Plan / ED Course  I have reviewed the triage vital signs and the nursing notes.  Pertinent labs & imaging results that were available during my care of the  patient were reviewed by me and considered in my medical decision making (see chart for details).  Clinical Course    This is a pleasant 70 year old female with mild dementia presenting after fall. Patient reports mechanical fall and states that she may have struck her head. She only physical exam findings are mild parasternal tenderness in the T-spine area. We'll  get CT head and neck given the mechanism and then get a plain film of the T-spine.  No abrasions required tetanus. Has family at bedside.  Ct negative  Patient is comfortable, ambulatory, and taking PO at time of discharge.  Patient expressed understanding about return precautions.    Final Clinical Impressions(s) / ED Diagnoses   Final diagnoses:  Fall, initial encounter    New Prescriptions New Prescriptions   No medications on file     Etty Isaac Randall An, MD 09/05/16 1116

## 2016-09-05 NOTE — ED Notes (Signed)
Pt ambulated to nearby restroom with stand by assist.

## 2016-09-05 NOTE — ED Notes (Signed)
MD at bedside. 

## 2016-09-07 DIAGNOSIS — E1165 Type 2 diabetes mellitus with hyperglycemia: Secondary | ICD-10-CM | POA: Diagnosis not present

## 2016-09-07 DIAGNOSIS — G300 Alzheimer's disease with early onset: Secondary | ICD-10-CM | POA: Diagnosis not present

## 2016-09-07 DIAGNOSIS — R296 Repeated falls: Secondary | ICD-10-CM | POA: Diagnosis not present

## 2016-09-09 ENCOUNTER — Ambulatory Visit (INDEPENDENT_AMBULATORY_CARE_PROVIDER_SITE_OTHER): Payer: Medicare Other | Admitting: Podiatry

## 2016-09-10 DIAGNOSIS — M6281 Muscle weakness (generalized): Secondary | ICD-10-CM | POA: Diagnosis not present

## 2016-09-10 DIAGNOSIS — R2681 Unsteadiness on feet: Secondary | ICD-10-CM | POA: Diagnosis not present

## 2016-09-10 DIAGNOSIS — N390 Urinary tract infection, site not specified: Secondary | ICD-10-CM | POA: Diagnosis not present

## 2016-09-10 DIAGNOSIS — Z79899 Other long term (current) drug therapy: Secondary | ICD-10-CM | POA: Diagnosis not present

## 2016-09-10 DIAGNOSIS — R319 Hematuria, unspecified: Secondary | ICD-10-CM | POA: Diagnosis not present

## 2016-09-10 NOTE — Progress Notes (Signed)
ERRONEOUS ENCOUNTER NO SHOW  

## 2016-09-11 DIAGNOSIS — R2681 Unsteadiness on feet: Secondary | ICD-10-CM | POA: Diagnosis not present

## 2016-09-11 DIAGNOSIS — M6281 Muscle weakness (generalized): Secondary | ICD-10-CM | POA: Diagnosis not present

## 2016-09-15 DIAGNOSIS — R2681 Unsteadiness on feet: Secondary | ICD-10-CM | POA: Diagnosis not present

## 2016-09-15 DIAGNOSIS — M6281 Muscle weakness (generalized): Secondary | ICD-10-CM | POA: Diagnosis not present

## 2016-09-16 DIAGNOSIS — M6281 Muscle weakness (generalized): Secondary | ICD-10-CM | POA: Diagnosis not present

## 2016-09-16 DIAGNOSIS — R2681 Unsteadiness on feet: Secondary | ICD-10-CM | POA: Diagnosis not present

## 2016-09-18 DIAGNOSIS — M6281 Muscle weakness (generalized): Secondary | ICD-10-CM | POA: Diagnosis not present

## 2016-09-18 DIAGNOSIS — R2681 Unsteadiness on feet: Secondary | ICD-10-CM | POA: Diagnosis not present

## 2016-09-21 ENCOUNTER — Other Ambulatory Visit: Payer: Self-pay | Admitting: *Deleted

## 2016-09-21 DIAGNOSIS — R2681 Unsteadiness on feet: Secondary | ICD-10-CM | POA: Diagnosis not present

## 2016-09-21 DIAGNOSIS — G300 Alzheimer's disease with early onset: Secondary | ICD-10-CM | POA: Diagnosis not present

## 2016-09-21 DIAGNOSIS — E1165 Type 2 diabetes mellitus with hyperglycemia: Secondary | ICD-10-CM | POA: Diagnosis not present

## 2016-09-21 DIAGNOSIS — M6281 Muscle weakness (generalized): Secondary | ICD-10-CM | POA: Diagnosis not present

## 2016-09-21 DIAGNOSIS — K649 Unspecified hemorrhoids: Secondary | ICD-10-CM | POA: Diagnosis not present

## 2016-09-21 MED ORDER — TRAZODONE HCL 50 MG PO TABS: 50.0000 mg | ORAL_TABLET | Freq: Every day | ORAL | 5 refills | Status: DC

## 2016-09-22 DIAGNOSIS — R2681 Unsteadiness on feet: Secondary | ICD-10-CM | POA: Diagnosis not present

## 2016-09-22 DIAGNOSIS — M6281 Muscle weakness (generalized): Secondary | ICD-10-CM | POA: Diagnosis not present

## 2016-09-23 DIAGNOSIS — M6281 Muscle weakness (generalized): Secondary | ICD-10-CM | POA: Diagnosis not present

## 2016-09-23 DIAGNOSIS — R2681 Unsteadiness on feet: Secondary | ICD-10-CM | POA: Diagnosis not present

## 2016-09-24 DIAGNOSIS — M6281 Muscle weakness (generalized): Secondary | ICD-10-CM | POA: Diagnosis not present

## 2016-09-24 DIAGNOSIS — R2681 Unsteadiness on feet: Secondary | ICD-10-CM | POA: Diagnosis not present

## 2016-09-27 DIAGNOSIS — R2681 Unsteadiness on feet: Secondary | ICD-10-CM | POA: Diagnosis not present

## 2016-09-27 DIAGNOSIS — M6281 Muscle weakness (generalized): Secondary | ICD-10-CM | POA: Diagnosis not present

## 2016-09-28 DIAGNOSIS — R2681 Unsteadiness on feet: Secondary | ICD-10-CM | POA: Diagnosis not present

## 2016-09-28 DIAGNOSIS — M6281 Muscle weakness (generalized): Secondary | ICD-10-CM | POA: Diagnosis not present

## 2016-09-29 DIAGNOSIS — R2681 Unsteadiness on feet: Secondary | ICD-10-CM | POA: Diagnosis not present

## 2016-09-29 DIAGNOSIS — M6281 Muscle weakness (generalized): Secondary | ICD-10-CM | POA: Diagnosis not present

## 2016-09-30 DIAGNOSIS — R278 Other lack of coordination: Secondary | ICD-10-CM | POA: Diagnosis not present

## 2016-09-30 DIAGNOSIS — R2681 Unsteadiness on feet: Secondary | ICD-10-CM | POA: Diagnosis not present

## 2016-09-30 DIAGNOSIS — M6281 Muscle weakness (generalized): Secondary | ICD-10-CM | POA: Diagnosis not present

## 2016-10-01 DIAGNOSIS — R278 Other lack of coordination: Secondary | ICD-10-CM | POA: Diagnosis not present

## 2016-10-01 DIAGNOSIS — R2681 Unsteadiness on feet: Secondary | ICD-10-CM | POA: Diagnosis not present

## 2016-10-01 DIAGNOSIS — M6281 Muscle weakness (generalized): Secondary | ICD-10-CM | POA: Diagnosis not present

## 2016-10-04 DIAGNOSIS — R278 Other lack of coordination: Secondary | ICD-10-CM | POA: Diagnosis not present

## 2016-10-04 DIAGNOSIS — R2681 Unsteadiness on feet: Secondary | ICD-10-CM | POA: Diagnosis not present

## 2016-10-04 DIAGNOSIS — M6281 Muscle weakness (generalized): Secondary | ICD-10-CM | POA: Diagnosis not present

## 2016-10-05 DIAGNOSIS — I10 Essential (primary) hypertension: Secondary | ICD-10-CM | POA: Diagnosis not present

## 2016-10-05 DIAGNOSIS — R278 Other lack of coordination: Secondary | ICD-10-CM | POA: Diagnosis not present

## 2016-10-05 DIAGNOSIS — R2681 Unsteadiness on feet: Secondary | ICD-10-CM | POA: Diagnosis not present

## 2016-10-05 DIAGNOSIS — G300 Alzheimer's disease with early onset: Secondary | ICD-10-CM | POA: Diagnosis not present

## 2016-10-05 DIAGNOSIS — K641 Second degree hemorrhoids: Secondary | ICD-10-CM | POA: Diagnosis not present

## 2016-10-05 DIAGNOSIS — M6281 Muscle weakness (generalized): Secondary | ICD-10-CM | POA: Diagnosis not present

## 2016-10-05 DIAGNOSIS — E1165 Type 2 diabetes mellitus with hyperglycemia: Secondary | ICD-10-CM | POA: Diagnosis not present

## 2016-10-07 DIAGNOSIS — R278 Other lack of coordination: Secondary | ICD-10-CM | POA: Diagnosis not present

## 2016-10-07 DIAGNOSIS — R2681 Unsteadiness on feet: Secondary | ICD-10-CM | POA: Diagnosis not present

## 2016-10-07 DIAGNOSIS — M6281 Muscle weakness (generalized): Secondary | ICD-10-CM | POA: Diagnosis not present

## 2016-10-08 DIAGNOSIS — R2681 Unsteadiness on feet: Secondary | ICD-10-CM | POA: Diagnosis not present

## 2016-10-08 DIAGNOSIS — R278 Other lack of coordination: Secondary | ICD-10-CM | POA: Diagnosis not present

## 2016-10-08 DIAGNOSIS — M6281 Muscle weakness (generalized): Secondary | ICD-10-CM | POA: Diagnosis not present

## 2016-10-12 DIAGNOSIS — R2681 Unsteadiness on feet: Secondary | ICD-10-CM | POA: Diagnosis not present

## 2016-10-12 DIAGNOSIS — R278 Other lack of coordination: Secondary | ICD-10-CM | POA: Diagnosis not present

## 2016-10-12 DIAGNOSIS — M6281 Muscle weakness (generalized): Secondary | ICD-10-CM | POA: Diagnosis not present

## 2016-10-13 DIAGNOSIS — M6281 Muscle weakness (generalized): Secondary | ICD-10-CM | POA: Diagnosis not present

## 2016-10-13 DIAGNOSIS — R278 Other lack of coordination: Secondary | ICD-10-CM | POA: Diagnosis not present

## 2016-10-13 DIAGNOSIS — R2681 Unsteadiness on feet: Secondary | ICD-10-CM | POA: Diagnosis not present

## 2016-10-14 DIAGNOSIS — R278 Other lack of coordination: Secondary | ICD-10-CM | POA: Diagnosis not present

## 2016-10-14 DIAGNOSIS — M6281 Muscle weakness (generalized): Secondary | ICD-10-CM | POA: Diagnosis not present

## 2016-10-14 DIAGNOSIS — R2681 Unsteadiness on feet: Secondary | ICD-10-CM | POA: Diagnosis not present

## 2016-10-16 DIAGNOSIS — R2681 Unsteadiness on feet: Secondary | ICD-10-CM | POA: Diagnosis not present

## 2016-10-16 DIAGNOSIS — R278 Other lack of coordination: Secondary | ICD-10-CM | POA: Diagnosis not present

## 2016-10-16 DIAGNOSIS — M6281 Muscle weakness (generalized): Secondary | ICD-10-CM | POA: Diagnosis not present

## 2016-10-19 DIAGNOSIS — R278 Other lack of coordination: Secondary | ICD-10-CM | POA: Diagnosis not present

## 2016-10-19 DIAGNOSIS — G300 Alzheimer's disease with early onset: Secondary | ICD-10-CM | POA: Diagnosis not present

## 2016-10-19 DIAGNOSIS — E1165 Type 2 diabetes mellitus with hyperglycemia: Secondary | ICD-10-CM | POA: Diagnosis not present

## 2016-10-19 DIAGNOSIS — J069 Acute upper respiratory infection, unspecified: Secondary | ICD-10-CM | POA: Diagnosis not present

## 2016-10-19 DIAGNOSIS — I10 Essential (primary) hypertension: Secondary | ICD-10-CM | POA: Diagnosis not present

## 2016-10-19 DIAGNOSIS — R2681 Unsteadiness on feet: Secondary | ICD-10-CM | POA: Diagnosis not present

## 2016-10-19 DIAGNOSIS — M6281 Muscle weakness (generalized): Secondary | ICD-10-CM | POA: Diagnosis not present

## 2016-10-20 DIAGNOSIS — M6281 Muscle weakness (generalized): Secondary | ICD-10-CM | POA: Diagnosis not present

## 2016-10-20 DIAGNOSIS — R2681 Unsteadiness on feet: Secondary | ICD-10-CM | POA: Diagnosis not present

## 2016-10-20 DIAGNOSIS — R278 Other lack of coordination: Secondary | ICD-10-CM | POA: Diagnosis not present

## 2016-10-21 DIAGNOSIS — R278 Other lack of coordination: Secondary | ICD-10-CM | POA: Diagnosis not present

## 2016-10-21 DIAGNOSIS — R2681 Unsteadiness on feet: Secondary | ICD-10-CM | POA: Diagnosis not present

## 2016-10-21 DIAGNOSIS — M6281 Muscle weakness (generalized): Secondary | ICD-10-CM | POA: Diagnosis not present

## 2016-10-25 DIAGNOSIS — M6281 Muscle weakness (generalized): Secondary | ICD-10-CM | POA: Diagnosis not present

## 2016-10-25 DIAGNOSIS — R2681 Unsteadiness on feet: Secondary | ICD-10-CM | POA: Diagnosis not present

## 2016-10-25 DIAGNOSIS — R278 Other lack of coordination: Secondary | ICD-10-CM | POA: Diagnosis not present

## 2016-10-26 DIAGNOSIS — M6281 Muscle weakness (generalized): Secondary | ICD-10-CM | POA: Diagnosis not present

## 2016-10-26 DIAGNOSIS — M160 Bilateral primary osteoarthritis of hip: Secondary | ICD-10-CM | POA: Diagnosis not present

## 2016-10-26 DIAGNOSIS — R2681 Unsteadiness on feet: Secondary | ICD-10-CM | POA: Diagnosis not present

## 2016-10-26 DIAGNOSIS — L299 Pruritus, unspecified: Secondary | ICD-10-CM | POA: Diagnosis not present

## 2016-10-26 DIAGNOSIS — R278 Other lack of coordination: Secondary | ICD-10-CM | POA: Diagnosis not present

## 2016-10-26 DIAGNOSIS — G300 Alzheimer's disease with early onset: Secondary | ICD-10-CM | POA: Diagnosis not present

## 2016-10-26 DIAGNOSIS — I10 Essential (primary) hypertension: Secondary | ICD-10-CM | POA: Diagnosis not present

## 2016-10-28 DIAGNOSIS — R278 Other lack of coordination: Secondary | ICD-10-CM | POA: Diagnosis not present

## 2016-10-28 DIAGNOSIS — R2681 Unsteadiness on feet: Secondary | ICD-10-CM | POA: Diagnosis not present

## 2016-10-28 DIAGNOSIS — M6281 Muscle weakness (generalized): Secondary | ICD-10-CM | POA: Diagnosis not present

## 2016-10-29 DIAGNOSIS — R278 Other lack of coordination: Secondary | ICD-10-CM | POA: Diagnosis not present

## 2016-10-29 DIAGNOSIS — M6281 Muscle weakness (generalized): Secondary | ICD-10-CM | POA: Diagnosis not present

## 2016-10-29 DIAGNOSIS — R2681 Unsteadiness on feet: Secondary | ICD-10-CM | POA: Diagnosis not present

## 2016-10-30 DIAGNOSIS — R2681 Unsteadiness on feet: Secondary | ICD-10-CM | POA: Diagnosis not present

## 2016-10-30 DIAGNOSIS — R278 Other lack of coordination: Secondary | ICD-10-CM | POA: Diagnosis not present

## 2016-10-30 DIAGNOSIS — M6281 Muscle weakness (generalized): Secondary | ICD-10-CM | POA: Diagnosis not present

## 2016-11-02 DIAGNOSIS — R2681 Unsteadiness on feet: Secondary | ICD-10-CM | POA: Diagnosis not present

## 2016-11-02 DIAGNOSIS — R278 Other lack of coordination: Secondary | ICD-10-CM | POA: Diagnosis not present

## 2016-11-02 DIAGNOSIS — G300 Alzheimer's disease with early onset: Secondary | ICD-10-CM | POA: Diagnosis not present

## 2016-11-02 DIAGNOSIS — M160 Bilateral primary osteoarthritis of hip: Secondary | ICD-10-CM | POA: Diagnosis not present

## 2016-11-02 DIAGNOSIS — L299 Pruritus, unspecified: Secondary | ICD-10-CM | POA: Diagnosis not present

## 2016-11-02 DIAGNOSIS — M6281 Muscle weakness (generalized): Secondary | ICD-10-CM | POA: Diagnosis not present

## 2016-11-02 DIAGNOSIS — I10 Essential (primary) hypertension: Secondary | ICD-10-CM | POA: Diagnosis not present

## 2016-11-03 DIAGNOSIS — M6281 Muscle weakness (generalized): Secondary | ICD-10-CM | POA: Diagnosis not present

## 2016-11-03 DIAGNOSIS — R2681 Unsteadiness on feet: Secondary | ICD-10-CM | POA: Diagnosis not present

## 2016-11-03 DIAGNOSIS — R278 Other lack of coordination: Secondary | ICD-10-CM | POA: Diagnosis not present

## 2016-11-04 DIAGNOSIS — R2681 Unsteadiness on feet: Secondary | ICD-10-CM | POA: Diagnosis not present

## 2016-11-04 DIAGNOSIS — R278 Other lack of coordination: Secondary | ICD-10-CM | POA: Diagnosis not present

## 2016-11-04 DIAGNOSIS — M6281 Muscle weakness (generalized): Secondary | ICD-10-CM | POA: Diagnosis not present

## 2016-11-06 DIAGNOSIS — R2681 Unsteadiness on feet: Secondary | ICD-10-CM | POA: Diagnosis not present

## 2016-11-06 DIAGNOSIS — M6281 Muscle weakness (generalized): Secondary | ICD-10-CM | POA: Diagnosis not present

## 2016-11-06 DIAGNOSIS — R278 Other lack of coordination: Secondary | ICD-10-CM | POA: Diagnosis not present

## 2016-11-09 DIAGNOSIS — I10 Essential (primary) hypertension: Secondary | ICD-10-CM | POA: Diagnosis not present

## 2016-11-09 DIAGNOSIS — R278 Other lack of coordination: Secondary | ICD-10-CM | POA: Diagnosis not present

## 2016-11-09 DIAGNOSIS — M6281 Muscle weakness (generalized): Secondary | ICD-10-CM | POA: Diagnosis not present

## 2016-11-09 DIAGNOSIS — E1165 Type 2 diabetes mellitus with hyperglycemia: Secondary | ICD-10-CM | POA: Diagnosis not present

## 2016-11-09 DIAGNOSIS — R2681 Unsteadiness on feet: Secondary | ICD-10-CM | POA: Diagnosis not present

## 2016-11-09 DIAGNOSIS — G300 Alzheimer's disease with early onset: Secondary | ICD-10-CM | POA: Diagnosis not present

## 2016-11-10 DIAGNOSIS — R2681 Unsteadiness on feet: Secondary | ICD-10-CM | POA: Diagnosis not present

## 2016-11-10 DIAGNOSIS — M6281 Muscle weakness (generalized): Secondary | ICD-10-CM | POA: Diagnosis not present

## 2016-11-10 DIAGNOSIS — R278 Other lack of coordination: Secondary | ICD-10-CM | POA: Diagnosis not present

## 2016-11-11 DIAGNOSIS — R2681 Unsteadiness on feet: Secondary | ICD-10-CM | POA: Diagnosis not present

## 2016-11-11 DIAGNOSIS — M6281 Muscle weakness (generalized): Secondary | ICD-10-CM | POA: Diagnosis not present

## 2016-11-11 DIAGNOSIS — R278 Other lack of coordination: Secondary | ICD-10-CM | POA: Diagnosis not present

## 2016-11-12 DIAGNOSIS — R278 Other lack of coordination: Secondary | ICD-10-CM | POA: Diagnosis not present

## 2016-11-12 DIAGNOSIS — M6281 Muscle weakness (generalized): Secondary | ICD-10-CM | POA: Diagnosis not present

## 2016-11-12 DIAGNOSIS — R2681 Unsteadiness on feet: Secondary | ICD-10-CM | POA: Diagnosis not present

## 2016-11-13 DIAGNOSIS — R278 Other lack of coordination: Secondary | ICD-10-CM | POA: Diagnosis not present

## 2016-11-13 DIAGNOSIS — R2681 Unsteadiness on feet: Secondary | ICD-10-CM | POA: Diagnosis not present

## 2016-11-13 DIAGNOSIS — M6281 Muscle weakness (generalized): Secondary | ICD-10-CM | POA: Diagnosis not present

## 2016-11-16 DIAGNOSIS — R2681 Unsteadiness on feet: Secondary | ICD-10-CM | POA: Diagnosis not present

## 2016-11-16 DIAGNOSIS — M6281 Muscle weakness (generalized): Secondary | ICD-10-CM | POA: Diagnosis not present

## 2016-11-16 DIAGNOSIS — R278 Other lack of coordination: Secondary | ICD-10-CM | POA: Diagnosis not present

## 2016-11-18 DIAGNOSIS — R278 Other lack of coordination: Secondary | ICD-10-CM | POA: Diagnosis not present

## 2016-11-18 DIAGNOSIS — R2681 Unsteadiness on feet: Secondary | ICD-10-CM | POA: Diagnosis not present

## 2016-11-18 DIAGNOSIS — N390 Urinary tract infection, site not specified: Secondary | ICD-10-CM | POA: Diagnosis not present

## 2016-11-18 DIAGNOSIS — M6281 Muscle weakness (generalized): Secondary | ICD-10-CM | POA: Diagnosis not present

## 2016-11-18 DIAGNOSIS — I670 Dissection of cerebral arteries, nonruptured: Secondary | ICD-10-CM | POA: Diagnosis not present

## 2016-11-19 DIAGNOSIS — R278 Other lack of coordination: Secondary | ICD-10-CM | POA: Diagnosis not present

## 2016-11-19 DIAGNOSIS — M6281 Muscle weakness (generalized): Secondary | ICD-10-CM | POA: Diagnosis not present

## 2016-11-19 DIAGNOSIS — R2681 Unsteadiness on feet: Secondary | ICD-10-CM | POA: Diagnosis not present

## 2016-11-21 DIAGNOSIS — R278 Other lack of coordination: Secondary | ICD-10-CM | POA: Diagnosis not present

## 2016-11-21 DIAGNOSIS — R2681 Unsteadiness on feet: Secondary | ICD-10-CM | POA: Diagnosis not present

## 2016-11-21 DIAGNOSIS — M6281 Muscle weakness (generalized): Secondary | ICD-10-CM | POA: Diagnosis not present

## 2016-11-24 DIAGNOSIS — R2681 Unsteadiness on feet: Secondary | ICD-10-CM | POA: Diagnosis not present

## 2016-11-24 DIAGNOSIS — M6281 Muscle weakness (generalized): Secondary | ICD-10-CM | POA: Diagnosis not present

## 2016-11-24 DIAGNOSIS — R278 Other lack of coordination: Secondary | ICD-10-CM | POA: Diagnosis not present

## 2016-11-25 DIAGNOSIS — R2681 Unsteadiness on feet: Secondary | ICD-10-CM | POA: Diagnosis not present

## 2016-11-25 DIAGNOSIS — M6281 Muscle weakness (generalized): Secondary | ICD-10-CM | POA: Diagnosis not present

## 2016-11-25 DIAGNOSIS — R278 Other lack of coordination: Secondary | ICD-10-CM | POA: Diagnosis not present

## 2016-11-27 DIAGNOSIS — R278 Other lack of coordination: Secondary | ICD-10-CM | POA: Diagnosis not present

## 2016-11-27 DIAGNOSIS — M6281 Muscle weakness (generalized): Secondary | ICD-10-CM | POA: Diagnosis not present

## 2016-11-27 DIAGNOSIS — R2681 Unsteadiness on feet: Secondary | ICD-10-CM | POA: Diagnosis not present

## 2016-11-29 DIAGNOSIS — R2681 Unsteadiness on feet: Secondary | ICD-10-CM | POA: Diagnosis not present

## 2016-11-29 DIAGNOSIS — R278 Other lack of coordination: Secondary | ICD-10-CM | POA: Diagnosis not present

## 2016-11-29 DIAGNOSIS — M6281 Muscle weakness (generalized): Secondary | ICD-10-CM | POA: Diagnosis not present

## 2016-12-01 DIAGNOSIS — R278 Other lack of coordination: Secondary | ICD-10-CM | POA: Diagnosis not present

## 2016-12-01 DIAGNOSIS — R2681 Unsteadiness on feet: Secondary | ICD-10-CM | POA: Diagnosis not present

## 2016-12-01 DIAGNOSIS — M6281 Muscle weakness (generalized): Secondary | ICD-10-CM | POA: Diagnosis not present

## 2016-12-02 DIAGNOSIS — M6281 Muscle weakness (generalized): Secondary | ICD-10-CM | POA: Diagnosis not present

## 2016-12-02 DIAGNOSIS — R278 Other lack of coordination: Secondary | ICD-10-CM | POA: Diagnosis not present

## 2016-12-02 DIAGNOSIS — R2681 Unsteadiness on feet: Secondary | ICD-10-CM | POA: Diagnosis not present

## 2016-12-03 DIAGNOSIS — R2681 Unsteadiness on feet: Secondary | ICD-10-CM | POA: Diagnosis not present

## 2016-12-03 DIAGNOSIS — R278 Other lack of coordination: Secondary | ICD-10-CM | POA: Diagnosis not present

## 2016-12-03 DIAGNOSIS — M6281 Muscle weakness (generalized): Secondary | ICD-10-CM | POA: Diagnosis not present

## 2016-12-07 DIAGNOSIS — R2681 Unsteadiness on feet: Secondary | ICD-10-CM | POA: Diagnosis not present

## 2016-12-07 DIAGNOSIS — M6281 Muscle weakness (generalized): Secondary | ICD-10-CM | POA: Diagnosis not present

## 2016-12-07 DIAGNOSIS — R278 Other lack of coordination: Secondary | ICD-10-CM | POA: Diagnosis not present

## 2016-12-08 DIAGNOSIS — R2681 Unsteadiness on feet: Secondary | ICD-10-CM | POA: Diagnosis not present

## 2016-12-08 DIAGNOSIS — R278 Other lack of coordination: Secondary | ICD-10-CM | POA: Diagnosis not present

## 2016-12-08 DIAGNOSIS — M6281 Muscle weakness (generalized): Secondary | ICD-10-CM | POA: Diagnosis not present

## 2017-01-04 DIAGNOSIS — G300 Alzheimer's disease with early onset: Secondary | ICD-10-CM | POA: Diagnosis not present

## 2017-01-04 DIAGNOSIS — I10 Essential (primary) hypertension: Secondary | ICD-10-CM | POA: Diagnosis not present

## 2017-01-04 DIAGNOSIS — E782 Mixed hyperlipidemia: Secondary | ICD-10-CM | POA: Diagnosis not present

## 2017-01-04 DIAGNOSIS — E1165 Type 2 diabetes mellitus with hyperglycemia: Secondary | ICD-10-CM | POA: Diagnosis not present

## 2017-01-06 DIAGNOSIS — E119 Type 2 diabetes mellitus without complications: Secondary | ICD-10-CM | POA: Diagnosis not present

## 2017-01-16 ENCOUNTER — Emergency Department (HOSPITAL_COMMUNITY)
Admission: EM | Admit: 2017-01-16 | Discharge: 2017-01-16 | Disposition: A | Discharge status: Home / Self Care | Payer: Medicare Other | Attending: Emergency Medicine | Admitting: Emergency Medicine

## 2017-01-16 ENCOUNTER — Encounter (HOSPITAL_COMMUNITY): Payer: Self-pay | Admitting: *Deleted

## 2017-01-16 ENCOUNTER — Emergency Department (HOSPITAL_COMMUNITY): Payer: Medicare Other

## 2017-01-16 DIAGNOSIS — Y939 Activity, unspecified: Secondary | ICD-10-CM | POA: Diagnosis not present

## 2017-01-16 DIAGNOSIS — E119 Type 2 diabetes mellitus without complications: Secondary | ICD-10-CM | POA: Diagnosis not present

## 2017-01-16 DIAGNOSIS — R030 Elevated blood-pressure reading, without diagnosis of hypertension: Secondary | ICD-10-CM | POA: Diagnosis not present

## 2017-01-16 DIAGNOSIS — E039 Hypothyroidism, unspecified: Secondary | ICD-10-CM | POA: Diagnosis not present

## 2017-01-16 DIAGNOSIS — Z91040 Latex allergy status: Secondary | ICD-10-CM | POA: Insufficient documentation

## 2017-01-16 DIAGNOSIS — Z8673 Personal history of transient ischemic attack (TIA), and cerebral infarction without residual deficits: Secondary | ICD-10-CM | POA: Diagnosis not present

## 2017-01-16 DIAGNOSIS — S0990XA Unspecified injury of head, initial encounter: Secondary | ICD-10-CM | POA: Diagnosis not present

## 2017-01-16 DIAGNOSIS — Y929 Unspecified place or not applicable: Secondary | ICD-10-CM | POA: Insufficient documentation

## 2017-01-16 DIAGNOSIS — S199XXA Unspecified injury of neck, initial encounter: Secondary | ICD-10-CM | POA: Diagnosis not present

## 2017-01-16 DIAGNOSIS — W19XXXA Unspecified fall, initial encounter: Secondary | ICD-10-CM | POA: Diagnosis not present

## 2017-01-16 DIAGNOSIS — Z794 Long term (current) use of insulin: Secondary | ICD-10-CM | POA: Diagnosis not present

## 2017-01-16 DIAGNOSIS — Z87891 Personal history of nicotine dependence: Secondary | ICD-10-CM | POA: Insufficient documentation

## 2017-01-16 DIAGNOSIS — Z79899 Other long term (current) drug therapy: Secondary | ICD-10-CM | POA: Diagnosis not present

## 2017-01-16 DIAGNOSIS — Y999 Unspecified external cause status: Secondary | ICD-10-CM | POA: Insufficient documentation

## 2017-01-16 DIAGNOSIS — S0993XA Unspecified injury of face, initial encounter: Secondary | ICD-10-CM | POA: Diagnosis not present

## 2017-01-16 DIAGNOSIS — S299XXA Unspecified injury of thorax, initial encounter: Secondary | ICD-10-CM | POA: Diagnosis not present

## 2017-01-16 DIAGNOSIS — T1490XA Injury, unspecified, initial encounter: Secondary | ICD-10-CM | POA: Diagnosis not present

## 2017-01-16 DIAGNOSIS — R259 Unspecified abnormal involuntary movements: Secondary | ICD-10-CM | POA: Diagnosis not present

## 2017-01-16 LAB — CBC WITH DIFFERENTIAL/PLATELET
BASOS ABS: 0 10*3/uL (ref 0.0–0.1)
Basophils Relative: 0 %
EOS PCT: 1 %
Eosinophils Absolute: 0.1 10*3/uL (ref 0.0–0.7)
HEMATOCRIT: 37.4 % (ref 36.0–46.0)
Hemoglobin: 12.2 g/dL (ref 12.0–15.0)
LYMPHS ABS: 1.6 10*3/uL (ref 0.7–4.0)
LYMPHS PCT: 21 %
MCH: 29.1 pg (ref 26.0–34.0)
MCHC: 32.6 g/dL (ref 30.0–36.0)
MCV: 89.3 fL (ref 78.0–100.0)
MONO ABS: 0.5 10*3/uL (ref 0.1–1.0)
MONOS PCT: 7 %
NEUTROS ABS: 5.6 10*3/uL (ref 1.7–7.7)
Neutrophils Relative %: 71 %
PLATELETS: 254 10*3/uL (ref 150–400)
RBC: 4.19 MIL/uL (ref 3.87–5.11)
RDW: 13.2 % (ref 11.5–15.5)
WBC: 7.8 10*3/uL (ref 4.0–10.5)

## 2017-01-16 LAB — COMPREHENSIVE METABOLIC PANEL
ALT: 17 U/L (ref 14–54)
ANION GAP: 7 (ref 5–15)
AST: 25 U/L (ref 15–41)
Albumin: 3.5 g/dL (ref 3.5–5.0)
Alkaline Phosphatase: 65 U/L (ref 38–126)
BILIRUBIN TOTAL: 0.6 mg/dL (ref 0.3–1.2)
BUN: 23 mg/dL — ABNORMAL HIGH (ref 6–20)
CALCIUM: 9.9 mg/dL (ref 8.9–10.3)
CO2: 28 mmol/L (ref 22–32)
CREATININE: 0.88 mg/dL (ref 0.44–1.00)
Chloride: 104 mmol/L (ref 101–111)
GFR calc non Af Amer: >60 mL/min (ref >60)
Glucose, Bld: 84 mg/dL (ref 65–99)
Potassium: 4.1 mmol/L (ref 3.5–5.1)
Sodium: 139 mmol/L (ref 135–145)
Total Protein: 6.6 g/dL (ref 6.5–8.1)

## 2017-01-16 LAB — CBG MONITORING, ED: GLUCOSE-CAPILLARY: 59 mg/dL — AB (ref 65–99)

## 2017-01-16 LAB — URINALYSIS, ROUTINE W REFLEX MICROSCOPIC
BACTERIA UA: NONE SEEN
BILIRUBIN URINE: NEGATIVE
Glucose, UA: NEGATIVE mg/dL
Ketones, ur: NEGATIVE mg/dL
LEUKOCYTES UA: NEGATIVE
Nitrite: NEGATIVE
Protein, ur: 30 mg/dL — AB
SPECIFIC GRAVITY, URINE: 1.012 (ref 1.005–1.030)
SQUAMOUS EPITHELIAL / LPF: NONE SEEN
pH: 6 (ref 5.0–8.0)

## 2017-01-16 NOTE — ED Notes (Signed)
Patient is a dementia patient and has had an increase in aggression towards staff.  Patient pleasant on arrival to ED.

## 2017-01-16 NOTE — ED Notes (Signed)
Patient cbg was [id number]. RN notified. Ham sandwich and orange juice was given.

## 2017-01-16 NOTE — Discharge Instructions (Signed)
As discussed, your evaluation today has been largely reassuring.  But, it is important that you monitor your condition carefully, and do not hesitate to return to the ED if you develop new, or concerning changes in your condition.  Please monitor your blood glucose carefully  Otherwise, please follow-up with your physician for appropriate ongoing care.

## 2017-01-16 NOTE — ED Triage Notes (Signed)
[facility]  Patient is being seen for fall along with altered mental status with increased combativeness that started a week ago.  Patient has no neck or back pain or deformities noted.  Patient has elevated BP on arrival to the ED.  Patient is not showing any signs of distress.

## 2017-01-16 NOTE — ED Notes (Signed)
Patient is alert and oriented x3.  She was given DC instructions and follow up visit instructions.  Patient gave verbal understanding. She was DC ambulatory under her own power to home.  V/S stable.  He was not showing any signs of distress on DC 

## 2017-01-16 NOTE — ED Provider Notes (Signed)
[facility]-EMERGENCY DEPT Provider Note   CSN: [id number] Arrival date & time: 01/16/17  1303     History   Chief Complaint Chief Complaint  Patient presents with  . Fall  . Altered Mental Status    HPI [name] is a 71 y.o. female.  HPI Patient presents with nursing facility after an unwitnessed fall. Patient herself is awake and alert, denies complaints including pain, nausea, lightheadedness. She is here with 2 family members note that the patient has had more irritability than usual. Patient has dementia, limiting her ability to assist with the history of present illness. Per report patient was found on the ground by nursing home staff. Level V caveat.   Past Medical History:  Diagnosis Date  . Abnormal stress test 08/21/2008   Neg  . Allergic rhinitis, cause unspecified   . Anxiety and depression   . Arthritis    Right Sacroiliac joint  . Chest pain, unspecified   . Chronic airway obstruction, not elsewhere classified   . GERD (gastroesophageal reflux disease)   . HTN (hypertension)   . Iron deficiency anemia, unspecified   . Other and unspecified hyperlipidemia   . Other B-complex deficiencies   . Other malaise and fatigue   . Personal history of colonic polyps   . TIA (transient ischemic attack)   . Type II or unspecified type diabetes mellitus without mention of complication, not stated as uncontrolled   . Unspecified asthma(493.90)   . Unspecified hypothyroidism     Patient Active Problem List   Diagnosis Date Noted  . 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 LBP (low back pain) 12/26/2015  . Abdominal pain, left lower quadrant 07/11/2015  . Hyperlipidemia 06/21/2015  . TIA (transient ischemic attack) 06/20/2015  . Hypokalemia 06/20/2015  . ARF (acute renal failure) (HCC) 06/20/2015  . Edema 05/29/2015  . Well adult exam 03/13/2015  . Breast pain, right 01/22/2015  . Intertriginous candidiasis 12/12/2014  . Leg swelling 11/29/2014  . Shoulder  blade pain 05/11/2014  . Gastroparesis 09/27/2013  . Diarrhea 09/06/2013  . OAB (overactive bladder) 08/23/2012  . Atrophic vaginitis 07/19/2012  . Memory loss 06/09/2012  . 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 Routine general medical examination at a health care facility 06/07/2011  . Allergic rhinitis due to pollen 01/30/2011  . Personal history of colonic polyps 08/12/2009  . ANEMIA, IRON DEFICIENCY 04/23/2009  . DIZZINESS 04/22/2009  . Other malaise and fatigue 12/27/2008  . ANXIETY DEPRESSION 09/08/2008  . CHEST PAIN, LEFT 08/24/2008  . B12 deficiency 05/15/2008  . ARTHRITIS, RIGHT SACROILIAC JOINT 04/11/2008  . 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 Hypothyroidism 11/02/2007  . Uncontrolled type 2 diabetes with renal manifestation (HCC) 11/02/2007  . HYPERLIPIDEMIA 11/02/2007  . Essential hypertension 11/02/2007  . HYSTERECTOMY, HX OF 11/02/2007    Past Surgical History:  Procedure Laterality Date  . ABDOMINAL HYSTERECTOMY  1982   menometorrhagia  . CARPAL TUNNEL RELEASE  03/05/2009   bilateral  . COLONOSCOPY N/A 09/06/2013   Procedure: COLONOSCOPY;  Surgeon: Louis Meckel, MD;  Location: [facility];  Service: Endoscopy;  Laterality: N/A;  . FOOT SURGERY     hammer toe 2004, 2005  . HOT HEMOSTASIS N/A 09/06/2013   Procedure: HOT HEMOSTASIS (ARGON PLASMA COAGULATION/BICAP);  Surgeon: Louis Meckel, MD;  Location: [facility];  Service: Endoscopy;  Laterality: N/A;    OB History    No data available       Home  Medications    Prior to Admission medications   Medication Sig Start Date End Date Taking? Authorizing Provider  acetaminophen (TYLENOL) 500 MG tablet Take 500 mg by mouth every 4 (four) hours as needed for mild pain, moderate pain, fever or headache.    Yes Historical Provider, MD  alum & mag hydroxide-simeth (MAALOX/MYLANTA) 200-200-20 MG/5ML suspension Take 30 mLs by mouth every 6 (six) hours as needed for  indigestion or heartburn.   Yes Historical Provider, MD  azelastine (ASTELIN) 0.1 % nasal spray Place 2 sprays into both nostrils 2 (two) times daily as needed for allergies.   Yes Historical Provider, MD  budesonide-formoterol (SYMBICORT) 80-4.5 MCG/ACT inhaler Inhale 2 puffs into the lungs 2 (two) times daily. 03/20/16  Yes Waymon Budge, MD  divalproex (DEPAKOTE SPRINKLE) 125 MG capsule Take 250 mg by mouth 2 (two) times daily.   Yes Historical Provider, MD  guaifenesin (ROBITUSSIN) 100 MG/5ML syrup Take 200 mg by mouth every 6 (six) hours as needed for cough.   Yes Historical Provider, MD  hydrocortisone (ANUSOL-HC) 2.5 % rectal cream Place 1 application rectally daily as needed for hemorrhoids or itching.   Yes Historical Provider, MD  insulin glargine (LANTUS) 100 UNIT/ML injection Inject 32 Units into the skin daily.   Yes Historical Provider, MD  levothyroxine (SYNTHROID) 100 MCG tablet Take 1 tablet (100 mcg total) by mouth daily before breakfast. 03/26/16  Yes Aleksei Plotnikov V, MD  loperamide (IMODIUM) 2 MG capsule Take 2 mg by mouth as needed for diarrhea or loose stools.   Yes Historical Provider, MD  LORazepam (ATIVAN) 0.5 MG tablet Take 0.5 mg by mouth daily as needed for anxiety (and/or agitation).   Yes Historical Provider, MD  magnesium hydroxide (MILK OF MAGNESIA) 400 MG/5ML suspension Take 30 mLs by mouth at bedtime as needed for mild constipation.   Yes Historical Provider, MD  Memantine HCl-Donepezil HCl (NAMZARIC) 28-10 MG CP24 Take 1 capsule by mouth daily.   Yes Historical Provider, MD  metFORMIN (GLUCOPHAGE) 1000 MG tablet Take 1 tablet (1,000 mg total) by mouth 2 (two) times daily with a meal. 03/23/16  Yes Aleksei Plotnikov V, MD  Multiple Vitamins-Iron (MULTIVITAMINS WITH IRON) TABS tablet Take 1 tablet by mouth daily.   Yes Historical Provider, MD  neomycin-bacitracin-polymyxin (NEOSPORIN) [phone] ointment Apply 1 application topically as needed (for wound care).   Yes  Historical Provider, MD  nitroGLYCERIN (NITROSTAT) 0.4 MG SL tablet Place 0.4 mg under the tongue every 5 (five) minutes as needed for chest pain.   Yes Historical Provider, MD  pravastatin (PRAVACHOL) 40 MG tablet Take 20 mg by mouth at bedtime.   Yes Historical Provider, MD  Skin Protectants, Misc. (BAZA PROTECT EX) Apply 1 application topically every evening. Pt applies to buttocks.   Yes Historical Provider, MD  traZODone (DESYREL) 50 MG tablet Take 50 mg by mouth at bedtime.   Yes Historical Provider, MD  valsartan (DIOVAN) 160 MG tablet Take 1 tablet (160 mg total) by mouth daily. 03/23/16  Yes Tresa Garter, MD    Family History Family History  Problem Relation Age of Onset  . Hypertension Mother   . Diabetes Mother   . Heart disease Mother     CHF  . Coronary artery disease Mother   . Lung cancer Father   . Hypertension Father   . Diabetes Father   . Liver cancer Father     w/mets  . Prostate cancer Father   . Lung disease Brother  Agent Orange Related  . Asthma Brother   . Allergies Brother   . Breast cancer Sister   . Colon cancer Neg Hx     Social History Social History  Substance Use Topics  . Smoking status: Former Smoker    Quit date: 06/01/1997  . Smokeless tobacco: Never Used     Comment: Quit 9 years ago.  . Alcohol use No     Allergies   Gabapentin; Lipitor [atorvastatin]; Zocor [simvastatin]; Clonazepam; Risperdal [risperidone]; Seroquel [quetiapine fumarate]; Aspirin; Codeine; Demerol; Latex; and Lisinopril   Review of Systems Review of Systems  Unable to perform ROS: Dementia     Physical Exam Updated Vital Signs BP 180/99 (BP Location: Right Arm)   Pulse 89   Temp 97.9 F (36.6 C) (Oral)   Resp 16   Ht 5\' 4"  (1.626 m)   Wt 190 lb (86.2 kg)   SpO2 95%   BMI 32.61 kg/m   Physical Exam  Constitutional: She appears well-developed and well-nourished. No distress.  HENT:  Head: Normocephalic.  Ecchymosis left anterior chin, no  tenderness to palpation of the TMJ bilaterally  Eyes: Conjunctivae and EOM are normal.  Cardiovascular: Normal rate and regular rhythm.   Pulmonary/Chest: Effort normal and breath sounds normal. No stridor. No respiratory distress.  Abdominal: She exhibits no distension.  Musculoskeletal: She exhibits no edema.  Neurological: She is alert. No cranial nerve deficit.  Face is symmetric, speech is clear, though the patient is disoriented beyond self, family members.  She does move all to be spontaneously.  Skin: Skin is warm and dry.  Psychiatric: Her speech is delayed. Cognition and memory are impaired.  Nursing note and vitals reviewed.    ED Treatments / Results  Labs (all labs ordered are listed, but only abnormal results are displayed) Labs Reviewed  COMPREHENSIVE METABOLIC PANEL - Abnormal; Notable for the following:       Result Value   BUN 23 (*)    All other components within normal limits  CBG MONITORING, ED - Abnormal; Notable for the following:    Glucose-Capillary 59 (*)    All other components within normal limits  CBC WITH DIFFERENTIAL/PLATELET  URINALYSIS, ROUTINE W REFLEX MICROSCOPIC    EKG  EKG Interpretation  Date/Time:  Saturday January 16 2017 14:12:36 EST Ventricular Rate:  67 PR Interval:    QRS Duration: 125 QT Interval:  423 QTC Calculation: 447 R Axis:   -47 Text Interpretation:  likely sinus rhythm Ventricular premature complex RBBB and LAFB Left ventricular hypertrophy Baseline wander in lead(s) V1 Artifact Abnormal ekg Reconfirmed by Gerhard Munch  MD [phone]) on 01/16/2017 2:17:34 PM Also confirmed by Gerhard Munch  MD ([id number]), editor [place] [facility], [place] [phone])  on 01/16/2017 2:22:06 PM       Radiology Dg Chest 2 View  Result Date: 01/16/2017 CLINICAL DATA:  Fall EXAM: CHEST  2 VIEW COMPARISON:  03/25/2016 FINDINGS: Normal heart size. Lungs are under aerated and grossly clear. No pneumothorax. No obvious acute bony deformity. Thoracic spine  is osteopenia. IMPRESSION: No active cardiopulmonary disease. Electronically Signed   By: Jolaine Click M.D.   On: 01/16/2017 14:51   Ct Head Wo Contrast  Result Date: 01/16/2017 CLINICAL DATA:  Altered mental status.  Fall. EXAM: CT HEAD WITHOUT CONTRAST CT MAXILLOFACIAL WITHOUT CONTRAST CT CERVICAL SPINE WITHOUT CONTRAST TECHNIQUE: Multidetector CT imaging of the head, cervical spine, and maxillofacial structures were performed using the standard protocol without intravenous contrast. Multiplanar CT image reconstructions of the cervical  spine and maxillofacial structures were also generated. COMPARISON:  CT head and CT cervical spine September 05, 2016 FINDINGS: CT HEAD FINDINGS Brain: Moderate diffuse atrophy is stable. There is no intracranial mass, hemorrhage, extra-axial fluid collection, or midline shift. There is patchy small vessel disease in the centra semiovale bilaterally, stable. No new gray-white compartment lesion is evident. No acute infarct apparent. Vascular: No hyperdense vessel. There is calcification in both carotid siphon regions bilaterally. Skull: The bony calvarium appears intact. Other: Mastoid air cells are clear. CT MAXILLOFACIAL FINDINGS Osseous: Motion artifact makes this study somewhat less than optimal. With the limitations of motion artifact, there is no evident fracture or dislocation. No blastic or lytic bone lesions are evident. Orbits: No intraorbital lesions are apparent. Orbits appear symmetric bilaterally with limitations of patient motion. Sinuses: There is nearly complete opacification of the right sphenoid sinus. There is opacification of multiple ethmoid air cells on the right. There is mild mucosal thickening of ethmoid air cells bilaterally. Other paranasal sinuses are clear. Ostiomeatal unit complexes are patent bilaterally. There is leftward deviation nasal septum. No nares obstruction on either side. Soft tissues: Salivary glands appear unremarkable. No adenopathy.  Tongue and tongue base regions appear normal. Pharynx appears unremarkable. No soft tissue hematoma or abscess evident. The premaxillary fat appears essentially unremarkable bilaterally. CT CERVICAL SPINE FINDINGS Alignment: There is no spondylolisthesis. Skull base and vertebrae: The craniocervical junction and skull base regions appear normal. There is mild pannus posterior to the odontoid which is not causing significant impression on the craniocervical junction. There is no evident fracture. There are no blastic or lytic bone lesions. Soft tissues and spinal canal: Prevertebral soft tissues and predental space regions are normal. No paraspinous lesions are evident. There is no demonstrable cord canal hematoma. No spinal stenosis evident. Disc levels: There is mild disc space narrowing at C7-T1. There is facet hypertrophy at multiple levels bilaterally, most notably at C3-4 on the left. There is mild exit foraminal narrowing on the left at C3-4 with mild impression on the exiting nerve root at this level. No other impression on exiting nerve roots is demonstrated on this study. No disc extrusion appreciable. Upper chest: Visualized lung apices are clear. Other: There is calcification in each subclavian artery. There is calcification in each carotid artery. IMPRESSION: CT head: Atrophy with periventricular small vessel disease, stable. No intracranial mass, hemorrhage, or extra-axial fluid collection. No acute infarct evident. There are foci of arterial vascular calcification. CT maxillofacial: Study somewhat limited by motion. Extensive paranasal sinus disease, most marked in the right sphenoid region. No fracture or dislocation evident. Ostiomeatal complexes are patent bilaterally. No intraorbital lesions. CT cervical spine: No fracture or spondylolisthesis. Areas of osteoarthritic change at several levels. Calcification in carotid subclavian arteries bilaterally. Electronically Signed   By: Bretta Bang  III M.D.   On: 01/16/2017 15:35   Ct Cervical Spine Wo Contrast  Result Date: 01/16/2017 CLINICAL DATA:  Altered mental status.  Fall. EXAM: CT HEAD WITHOUT CONTRAST CT MAXILLOFACIAL WITHOUT CONTRAST CT CERVICAL SPINE WITHOUT CONTRAST TECHNIQUE: Multidetector CT imaging of the head, cervical spine, and maxillofacial structures were performed using the standard protocol without intravenous contrast. Multiplanar CT image reconstructions of the cervical spine and maxillofacial structures were also generated. COMPARISON:  CT head and CT cervical spine September 05, 2016 FINDINGS: CT HEAD FINDINGS Brain: Moderate diffuse atrophy is stable. There is no intracranial mass, hemorrhage, extra-axial fluid collection, or midline shift. There is patchy small vessel disease in the centra semiovale  bilaterally, stable. No new gray-white compartment lesion is evident. No acute infarct apparent. Vascular: No hyperdense vessel. There is calcification in both carotid siphon regions bilaterally. Skull: The bony calvarium appears intact. Other: Mastoid air cells are clear. CT MAXILLOFACIAL FINDINGS Osseous: Motion artifact makes this study somewhat less than optimal. With the limitations of motion artifact, there is no evident fracture or dislocation. No blastic or lytic bone lesions are evident. Orbits: No intraorbital lesions are apparent. Orbits appear symmetric bilaterally with limitations of patient motion. Sinuses: There is nearly complete opacification of the right sphenoid sinus. There is opacification of multiple ethmoid air cells on the right. There is mild mucosal thickening of ethmoid air cells bilaterally. Other paranasal sinuses are clear. Ostiomeatal unit complexes are patent bilaterally. There is leftward deviation nasal septum. No nares obstruction on either side. Soft tissues: Salivary glands appear unremarkable. No adenopathy. Tongue and tongue base regions appear normal. Pharynx appears unremarkable. No soft tissue  hematoma or abscess evident. The premaxillary fat appears essentially unremarkable bilaterally. CT CERVICAL SPINE FINDINGS Alignment: There is no spondylolisthesis. Skull base and vertebrae: The craniocervical junction and skull base regions appear normal. There is mild pannus posterior to the odontoid which is not causing significant impression on the craniocervical junction. There is no evident fracture. There are no blastic or lytic bone lesions. Soft tissues and spinal canal: Prevertebral soft tissues and predental space regions are normal. No paraspinous lesions are evident. There is no demonstrable cord canal hematoma. No spinal stenosis evident. Disc levels: There is mild disc space narrowing at C7-T1. There is facet hypertrophy at multiple levels bilaterally, most notably at C3-4 on the left. There is mild exit foraminal narrowing on the left at C3-4 with mild impression on the exiting nerve root at this level. No other impression on exiting nerve roots is demonstrated on this study. No disc extrusion appreciable. Upper chest: Visualized lung apices are clear. Other: There is calcification in each subclavian artery. There is calcification in each carotid artery. IMPRESSION: CT head: Atrophy with periventricular small vessel disease, stable. No intracranial mass, hemorrhage, or extra-axial fluid collection. No acute infarct evident. There are foci of arterial vascular calcification. CT maxillofacial: Study somewhat limited by motion. Extensive paranasal sinus disease, most marked in the right sphenoid region. No fracture or dislocation evident. Ostiomeatal complexes are patent bilaterally. No intraorbital lesions. CT cervical spine: No fracture or spondylolisthesis. Areas of osteoarthritic change at several levels. Calcification in carotid subclavian arteries bilaterally. Electronically Signed   By: Bretta Bang III M.D.   On: 01/16/2017 15:35   Ct Maxillofacial Wo Cm  Result Date:  01/16/2017 CLINICAL DATA:  Altered mental status.  Fall. EXAM: CT HEAD WITHOUT CONTRAST CT MAXILLOFACIAL WITHOUT CONTRAST CT CERVICAL SPINE WITHOUT CONTRAST TECHNIQUE: Multidetector CT imaging of the head, cervical spine, and maxillofacial structures were performed using the standard protocol without intravenous contrast. Multiplanar CT image reconstructions of the cervical spine and maxillofacial structures were also generated. COMPARISON:  CT head and CT cervical spine September 05, 2016 FINDINGS: CT HEAD FINDINGS Brain: Moderate diffuse atrophy is stable. There is no intracranial mass, hemorrhage, extra-axial fluid collection, or midline shift. There is patchy small vessel disease in the centra semiovale bilaterally, stable. No new gray-white compartment lesion is evident. No acute infarct apparent. Vascular: No hyperdense vessel. There is calcification in both carotid siphon regions bilaterally. Skull: The bony calvarium appears intact. Other: Mastoid air cells are clear. CT MAXILLOFACIAL FINDINGS Osseous: Motion artifact makes this study somewhat less than optimal.  With the limitations of motion artifact, there is no evident fracture or dislocation. No blastic or lytic bone lesions are evident. Orbits: No intraorbital lesions are apparent. Orbits appear symmetric bilaterally with limitations of patient motion. Sinuses: There is nearly complete opacification of the right sphenoid sinus. There is opacification of multiple ethmoid air cells on the right. There is mild mucosal thickening of ethmoid air cells bilaterally. Other paranasal sinuses are clear. Ostiomeatal unit complexes are patent bilaterally. There is leftward deviation nasal septum. No nares obstruction on either side. Soft tissues: Salivary glands appear unremarkable. No adenopathy. Tongue and tongue base regions appear normal. Pharynx appears unremarkable. No soft tissue hematoma or abscess evident. The premaxillary fat appears essentially unremarkable  bilaterally. CT CERVICAL SPINE FINDINGS Alignment: There is no spondylolisthesis. Skull base and vertebrae: The craniocervical junction and skull base regions appear normal. There is mild pannus posterior to the odontoid which is not causing significant impression on the craniocervical junction. There is no evident fracture. There are no blastic or lytic bone lesions. Soft tissues and spinal canal: Prevertebral soft tissues and predental space regions are normal. No paraspinous lesions are evident. There is no demonstrable cord canal hematoma. No spinal stenosis evident. Disc levels: There is mild disc space narrowing at C7-T1. There is facet hypertrophy at multiple levels bilaterally, most notably at C3-4 on the left. There is mild exit foraminal narrowing on the left at C3-4 with mild impression on the exiting nerve root at this level. No other impression on exiting nerve roots is demonstrated on this study. No disc extrusion appreciable. Upper chest: Visualized lung apices are clear. Other: There is calcification in each subclavian artery. There is calcification in each carotid artery. IMPRESSION: CT head: Atrophy with periventricular small vessel disease, stable. No intracranial mass, hemorrhage, or extra-axial fluid collection. No acute infarct evident. There are foci of arterial vascular calcification. CT maxillofacial: Study somewhat limited by motion. Extensive paranasal sinus disease, most marked in the right sphenoid region. No fracture or dislocation evident. Ostiomeatal complexes are patent bilaterally. No intraorbital lesions. CT cervical spine: No fracture or spondylolisthesis. Areas of osteoarthritic change at several levels. Calcification in carotid subclavian arteries bilaterally. Electronically Signed   By: Bretta BangWilliam  Woodruff III M.D.   On: 01/16/2017 15:35    Procedures Procedures (including critical care time)    Initial Impression / Assessment and Plan / ED Course  I have reviewed the  triage vital signs and the nursing notes.  Pertinent labs & imaging results that were available during my care of the patient were reviewed by me and considered in my medical decision making (see chart for details).  Repeat examination is in no distress. Reassuring CT results discussed.  Patient is a borderline low normal blood glucose, was fed.  Patient required catheterization to obtain urinalysis    Final Clinical Impressions(s) / ED Diagnoses  Warnell BureauFall    Aarionna Germer, MD 01/16/17 [phone]

## 2017-01-16 NOTE — ED Notes (Signed)
Bed: [id number] Expected date:  Expected time:  Means of arrival:  Comments: Larey SeatFell out of chair - grandma

## 2017-01-16 NOTE — ED Notes (Signed)
Attempted to put patient on bedpan but she had already went in her brief.

## 2017-01-18 DIAGNOSIS — R296 Repeated falls: Secondary | ICD-10-CM | POA: Diagnosis not present

## 2017-01-18 DIAGNOSIS — E1165 Type 2 diabetes mellitus with hyperglycemia: Secondary | ICD-10-CM | POA: Diagnosis not present

## 2017-01-18 DIAGNOSIS — G300 Alzheimer's disease with early onset: Secondary | ICD-10-CM | POA: Diagnosis not present

## 2017-01-18 DIAGNOSIS — I10 Essential (primary) hypertension: Secondary | ICD-10-CM | POA: Diagnosis not present

## 2017-01-21 DIAGNOSIS — R319 Hematuria, unspecified: Secondary | ICD-10-CM | POA: Diagnosis not present

## 2017-01-21 DIAGNOSIS — N390 Urinary tract infection, site not specified: Secondary | ICD-10-CM | POA: Diagnosis not present

## 2017-01-21 DIAGNOSIS — Z79899 Other long term (current) drug therapy: Secondary | ICD-10-CM | POA: Diagnosis not present

## 2017-01-25 DIAGNOSIS — I10 Essential (primary) hypertension: Secondary | ICD-10-CM | POA: Diagnosis not present

## 2017-01-25 DIAGNOSIS — R296 Repeated falls: Secondary | ICD-10-CM | POA: Diagnosis not present

## 2017-01-25 DIAGNOSIS — G300 Alzheimer's disease with early onset: Secondary | ICD-10-CM | POA: Diagnosis not present

## 2017-01-25 DIAGNOSIS — E1165 Type 2 diabetes mellitus with hyperglycemia: Secondary | ICD-10-CM | POA: Diagnosis not present

## 2017-02-01 DIAGNOSIS — E1165 Type 2 diabetes mellitus with hyperglycemia: Secondary | ICD-10-CM | POA: Diagnosis not present

## 2017-02-01 DIAGNOSIS — G300 Alzheimer's disease with early onset: Secondary | ICD-10-CM | POA: Diagnosis not present

## 2017-02-01 DIAGNOSIS — K5901 Slow transit constipation: Secondary | ICD-10-CM | POA: Diagnosis not present

## 2017-02-01 DIAGNOSIS — I10 Essential (primary) hypertension: Secondary | ICD-10-CM | POA: Diagnosis not present

## 2017-02-02 DIAGNOSIS — R296 Repeated falls: Secondary | ICD-10-CM | POA: Diagnosis not present

## 2017-02-02 DIAGNOSIS — R2681 Unsteadiness on feet: Secondary | ICD-10-CM | POA: Diagnosis not present

## 2017-02-03 DIAGNOSIS — R2681 Unsteadiness on feet: Secondary | ICD-10-CM | POA: Diagnosis not present

## 2017-02-03 DIAGNOSIS — Z961 Presence of intraocular lens: Secondary | ICD-10-CM | POA: Diagnosis not present

## 2017-02-03 DIAGNOSIS — R296 Repeated falls: Secondary | ICD-10-CM | POA: Diagnosis not present

## 2017-02-05 DIAGNOSIS — R296 Repeated falls: Secondary | ICD-10-CM | POA: Diagnosis not present

## 2017-02-05 DIAGNOSIS — R2681 Unsteadiness on feet: Secondary | ICD-10-CM | POA: Diagnosis not present

## 2017-02-06 DIAGNOSIS — R296 Repeated falls: Secondary | ICD-10-CM | POA: Diagnosis not present

## 2017-02-06 DIAGNOSIS — R2681 Unsteadiness on feet: Secondary | ICD-10-CM | POA: Diagnosis not present

## 2017-02-08 DIAGNOSIS — R2681 Unsteadiness on feet: Secondary | ICD-10-CM | POA: Diagnosis not present

## 2017-02-08 DIAGNOSIS — R296 Repeated falls: Secondary | ICD-10-CM | POA: Diagnosis not present

## 2017-02-09 DIAGNOSIS — L299 Pruritus, unspecified: Secondary | ICD-10-CM | POA: Diagnosis not present

## 2017-02-09 DIAGNOSIS — G300 Alzheimer's disease with early onset: Secondary | ICD-10-CM | POA: Diagnosis not present

## 2017-02-10 DIAGNOSIS — R296 Repeated falls: Secondary | ICD-10-CM | POA: Diagnosis not present

## 2017-02-10 DIAGNOSIS — R2681 Unsteadiness on feet: Secondary | ICD-10-CM | POA: Diagnosis not present

## 2017-02-12 DIAGNOSIS — R296 Repeated falls: Secondary | ICD-10-CM | POA: Diagnosis not present

## 2017-02-12 DIAGNOSIS — R2681 Unsteadiness on feet: Secondary | ICD-10-CM | POA: Diagnosis not present

## 2017-02-13 DIAGNOSIS — R2681 Unsteadiness on feet: Secondary | ICD-10-CM | POA: Diagnosis not present

## 2017-02-13 DIAGNOSIS — R296 Repeated falls: Secondary | ICD-10-CM | POA: Diagnosis not present

## 2017-02-15 DIAGNOSIS — G300 Alzheimer's disease with early onset: Secondary | ICD-10-CM | POA: Diagnosis not present

## 2017-02-15 DIAGNOSIS — Z87440 Personal history of urinary (tract) infections: Secondary | ICD-10-CM | POA: Diagnosis not present

## 2017-02-17 DIAGNOSIS — R296 Repeated falls: Secondary | ICD-10-CM | POA: Diagnosis not present

## 2017-02-17 DIAGNOSIS — R2681 Unsteadiness on feet: Secondary | ICD-10-CM | POA: Diagnosis not present

## 2017-02-19 DIAGNOSIS — D649 Anemia, unspecified: Secondary | ICD-10-CM | POA: Diagnosis not present

## 2017-02-19 DIAGNOSIS — R2681 Unsteadiness on feet: Secondary | ICD-10-CM | POA: Diagnosis not present

## 2017-02-19 DIAGNOSIS — R296 Repeated falls: Secondary | ICD-10-CM | POA: Diagnosis not present

## 2017-02-19 DIAGNOSIS — Z5181 Encounter for therapeutic drug level monitoring: Secondary | ICD-10-CM | POA: Diagnosis not present

## 2017-02-22 DIAGNOSIS — R296 Repeated falls: Secondary | ICD-10-CM | POA: Diagnosis not present

## 2017-02-22 DIAGNOSIS — R2681 Unsteadiness on feet: Secondary | ICD-10-CM | POA: Diagnosis not present

## 2017-02-24 DIAGNOSIS — R2681 Unsteadiness on feet: Secondary | ICD-10-CM | POA: Diagnosis not present

## 2017-02-24 DIAGNOSIS — R296 Repeated falls: Secondary | ICD-10-CM | POA: Diagnosis not present

## 2017-02-26 DIAGNOSIS — R296 Repeated falls: Secondary | ICD-10-CM | POA: Diagnosis not present

## 2017-02-26 DIAGNOSIS — R2681 Unsteadiness on feet: Secondary | ICD-10-CM | POA: Diagnosis not present

## 2017-03-01 DIAGNOSIS — R2681 Unsteadiness on feet: Secondary | ICD-10-CM | POA: Diagnosis not present

## 2017-03-01 DIAGNOSIS — R296 Repeated falls: Secondary | ICD-10-CM | POA: Diagnosis not present

## 2017-03-02 DIAGNOSIS — R600 Localized edema: Secondary | ICD-10-CM | POA: Diagnosis not present

## 2017-03-02 DIAGNOSIS — G300 Alzheimer's disease with early onset: Secondary | ICD-10-CM | POA: Diagnosis not present

## 2017-03-02 DIAGNOSIS — I10 Essential (primary) hypertension: Secondary | ICD-10-CM | POA: Diagnosis not present

## 2017-03-03 DIAGNOSIS — Z79899 Other long term (current) drug therapy: Secondary | ICD-10-CM | POA: Diagnosis not present

## 2017-03-03 DIAGNOSIS — R319 Hematuria, unspecified: Secondary | ICD-10-CM | POA: Diagnosis not present

## 2017-03-03 DIAGNOSIS — R2681 Unsteadiness on feet: Secondary | ICD-10-CM | POA: Diagnosis not present

## 2017-03-03 DIAGNOSIS — E119 Type 2 diabetes mellitus without complications: Secondary | ICD-10-CM | POA: Diagnosis not present

## 2017-03-03 DIAGNOSIS — I670 Dissection of cerebral arteries, nonruptured: Secondary | ICD-10-CM | POA: Diagnosis not present

## 2017-03-03 DIAGNOSIS — N390 Urinary tract infection, site not specified: Secondary | ICD-10-CM | POA: Diagnosis not present

## 2017-03-03 DIAGNOSIS — R296 Repeated falls: Secondary | ICD-10-CM | POA: Diagnosis not present

## 2017-03-04 DIAGNOSIS — M79671 Pain in right foot: Secondary | ICD-10-CM | POA: Diagnosis not present

## 2017-03-05 DIAGNOSIS — R296 Repeated falls: Secondary | ICD-10-CM | POA: Diagnosis not present

## 2017-03-05 DIAGNOSIS — R2681 Unsteadiness on feet: Secondary | ICD-10-CM | POA: Diagnosis not present

## 2017-03-16 DIAGNOSIS — R2681 Unsteadiness on feet: Secondary | ICD-10-CM | POA: Diagnosis not present

## 2017-03-16 DIAGNOSIS — R296 Repeated falls: Secondary | ICD-10-CM | POA: Diagnosis not present

## 2017-04-13 DIAGNOSIS — G300 Alzheimer's disease with early onset: Secondary | ICD-10-CM | POA: Diagnosis not present

## 2017-06-28 DIAGNOSIS — M6281 Muscle weakness (generalized): Secondary | ICD-10-CM | POA: Diagnosis not present

## 2017-06-28 DIAGNOSIS — R269 Unspecified abnormalities of gait and mobility: Secondary | ICD-10-CM | POA: Diagnosis not present

## 2017-06-28 DIAGNOSIS — G300 Alzheimer's disease with early onset: Secondary | ICD-10-CM | POA: Diagnosis not present

## 2017-07-07 DIAGNOSIS — G300 Alzheimer's disease with early onset: Secondary | ICD-10-CM | POA: Diagnosis not present

## 2017-07-07 DIAGNOSIS — E119 Type 2 diabetes mellitus without complications: Secondary | ICD-10-CM | POA: Diagnosis not present

## 2017-07-07 DIAGNOSIS — D649 Anemia, unspecified: Secondary | ICD-10-CM | POA: Diagnosis not present

## 2017-07-07 DIAGNOSIS — R2689 Other abnormalities of gait and mobility: Secondary | ICD-10-CM | POA: Diagnosis not present

## 2017-07-07 DIAGNOSIS — M6281 Muscle weakness (generalized): Secondary | ICD-10-CM | POA: Diagnosis not present

## 2017-07-07 DIAGNOSIS — E039 Hypothyroidism, unspecified: Secondary | ICD-10-CM | POA: Diagnosis not present

## 2017-07-12 DIAGNOSIS — E034 Atrophy of thyroid (acquired): Secondary | ICD-10-CM | POA: Diagnosis not present

## 2017-07-12 DIAGNOSIS — G300 Alzheimer's disease with early onset: Secondary | ICD-10-CM | POA: Diagnosis not present

## 2017-07-12 DIAGNOSIS — E1165 Type 2 diabetes mellitus with hyperglycemia: Secondary | ICD-10-CM | POA: Diagnosis not present

## 2017-08-03 DIAGNOSIS — G300 Alzheimer's disease with early onset: Secondary | ICD-10-CM | POA: Diagnosis not present

## 2017-08-03 DIAGNOSIS — K648 Other hemorrhoids: Secondary | ICD-10-CM | POA: Diagnosis not present

## 2017-08-05 ENCOUNTER — Telehealth: Payer: Self-pay | Admitting: Internal Medicine

## 2017-08-05 NOTE — Telephone Encounter (Signed)
Can you ask if she came back to have it read -

## 2017-08-05 NOTE — Telephone Encounter (Signed)
Katie from [facility] called requesting results from the pts TB Test that was done in July of 2017. I do not have access to print this report. Can you print this for me?  It will need to be faxed to [phone].

## 2017-08-05 NOTE — Telephone Encounter (Signed)
I spoke with the pts daughter/power of attorney and she said that they did come back to have it checked because [facility] needed the results before she was able to move into their facility.

## 2017-08-06 NOTE — Telephone Encounter (Signed)
[facility].

## 2017-08-06 NOTE — Telephone Encounter (Signed)
Enter Edit results was not entered. Letter was found in chart. Printed and given to carson.

## 2017-08-09 DIAGNOSIS — R233 Spontaneous ecchymoses: Secondary | ICD-10-CM | POA: Diagnosis not present

## 2017-08-09 DIAGNOSIS — G300 Alzheimer's disease with early onset: Secondary | ICD-10-CM | POA: Diagnosis not present

## 2017-08-24 DIAGNOSIS — I10 Essential (primary) hypertension: Secondary | ICD-10-CM | POA: Diagnosis not present

## 2017-08-24 DIAGNOSIS — R569 Unspecified convulsions: Secondary | ICD-10-CM | POA: Diagnosis not present

## 2017-08-24 DIAGNOSIS — G309 Alzheimer's disease, unspecified: Secondary | ICD-10-CM | POA: Diagnosis not present

## 2017-08-24 DIAGNOSIS — E785 Hyperlipidemia, unspecified: Secondary | ICD-10-CM | POA: Diagnosis not present

## 2017-08-24 DIAGNOSIS — J449 Chronic obstructive pulmonary disease, unspecified: Secondary | ICD-10-CM | POA: Diagnosis not present

## 2017-08-24 DIAGNOSIS — E039 Hypothyroidism, unspecified: Secondary | ICD-10-CM | POA: Diagnosis not present

## 2017-08-24 DIAGNOSIS — D649 Anemia, unspecified: Secondary | ICD-10-CM | POA: Diagnosis not present

## 2017-08-24 DIAGNOSIS — E559 Vitamin D deficiency, unspecified: Secondary | ICD-10-CM | POA: Diagnosis not present

## 2017-08-25 DIAGNOSIS — E7800 Pure hypercholesterolemia, unspecified: Secondary | ICD-10-CM | POA: Diagnosis not present

## 2017-08-25 DIAGNOSIS — E119 Type 2 diabetes mellitus without complications: Secondary | ICD-10-CM | POA: Diagnosis not present

## 2017-08-25 DIAGNOSIS — E039 Hypothyroidism, unspecified: Secondary | ICD-10-CM | POA: Diagnosis not present

## 2017-08-25 DIAGNOSIS — G309 Alzheimer's disease, unspecified: Secondary | ICD-10-CM | POA: Diagnosis not present

## 2017-08-25 DIAGNOSIS — I10 Essential (primary) hypertension: Secondary | ICD-10-CM | POA: Diagnosis not present

## 2017-08-26 DIAGNOSIS — E785 Hyperlipidemia, unspecified: Secondary | ICD-10-CM | POA: Diagnosis not present

## 2017-08-26 DIAGNOSIS — E039 Hypothyroidism, unspecified: Secondary | ICD-10-CM | POA: Diagnosis not present

## 2017-08-26 DIAGNOSIS — M6281 Muscle weakness (generalized): Secondary | ICD-10-CM | POA: Diagnosis not present

## 2017-08-26 DIAGNOSIS — E119 Type 2 diabetes mellitus without complications: Secondary | ICD-10-CM | POA: Diagnosis not present

## 2017-08-26 DIAGNOSIS — I10 Essential (primary) hypertension: Secondary | ICD-10-CM | POA: Diagnosis not present

## 2017-08-26 DIAGNOSIS — K219 Gastro-esophageal reflux disease without esophagitis: Secondary | ICD-10-CM | POA: Diagnosis not present

## 2017-08-26 DIAGNOSIS — G308 Other Alzheimer's disease: Secondary | ICD-10-CM | POA: Diagnosis not present

## 2017-08-26 DIAGNOSIS — G4700 Insomnia, unspecified: Secondary | ICD-10-CM | POA: Diagnosis not present

## 2017-08-27 DIAGNOSIS — M6281 Muscle weakness (generalized): Secondary | ICD-10-CM | POA: Diagnosis not present

## 2017-08-27 DIAGNOSIS — E039 Hypothyroidism, unspecified: Secondary | ICD-10-CM | POA: Diagnosis not present

## 2017-08-27 DIAGNOSIS — E119 Type 2 diabetes mellitus without complications: Secondary | ICD-10-CM | POA: Diagnosis not present

## 2017-08-27 DIAGNOSIS — I10 Essential (primary) hypertension: Secondary | ICD-10-CM | POA: Diagnosis not present

## 2017-08-27 DIAGNOSIS — G4700 Insomnia, unspecified: Secondary | ICD-10-CM | POA: Diagnosis not present

## 2017-08-27 DIAGNOSIS — E785 Hyperlipidemia, unspecified: Secondary | ICD-10-CM | POA: Diagnosis not present

## 2017-08-27 DIAGNOSIS — G308 Other Alzheimer's disease: Secondary | ICD-10-CM | POA: Diagnosis not present

## 2017-08-27 DIAGNOSIS — K219 Gastro-esophageal reflux disease without esophagitis: Secondary | ICD-10-CM | POA: Diagnosis not present

## 2017-08-30 DIAGNOSIS — G4700 Insomnia, unspecified: Secondary | ICD-10-CM | POA: Diagnosis not present

## 2017-08-30 DIAGNOSIS — K219 Gastro-esophageal reflux disease without esophagitis: Secondary | ICD-10-CM | POA: Diagnosis not present

## 2017-08-30 DIAGNOSIS — M6281 Muscle weakness (generalized): Secondary | ICD-10-CM | POA: Diagnosis not present

## 2017-08-30 DIAGNOSIS — I10 Essential (primary) hypertension: Secondary | ICD-10-CM | POA: Diagnosis not present

## 2017-08-30 DIAGNOSIS — E119 Type 2 diabetes mellitus without complications: Secondary | ICD-10-CM | POA: Diagnosis not present

## 2017-08-30 DIAGNOSIS — E785 Hyperlipidemia, unspecified: Secondary | ICD-10-CM | POA: Diagnosis not present

## 2017-08-30 DIAGNOSIS — R278 Other lack of coordination: Secondary | ICD-10-CM | POA: Diagnosis not present

## 2017-08-30 DIAGNOSIS — E039 Hypothyroidism, unspecified: Secondary | ICD-10-CM | POA: Diagnosis not present

## 2017-08-30 DIAGNOSIS — G308 Other Alzheimer's disease: Secondary | ICD-10-CM | POA: Diagnosis not present

## 2017-08-31 DIAGNOSIS — K219 Gastro-esophageal reflux disease without esophagitis: Secondary | ICD-10-CM | POA: Diagnosis not present

## 2017-08-31 DIAGNOSIS — M6281 Muscle weakness (generalized): Secondary | ICD-10-CM | POA: Diagnosis not present

## 2017-08-31 DIAGNOSIS — E119 Type 2 diabetes mellitus without complications: Secondary | ICD-10-CM | POA: Diagnosis not present

## 2017-08-31 DIAGNOSIS — E785 Hyperlipidemia, unspecified: Secondary | ICD-10-CM | POA: Diagnosis not present

## 2017-08-31 DIAGNOSIS — G308 Other Alzheimer's disease: Secondary | ICD-10-CM | POA: Diagnosis not present

## 2017-08-31 DIAGNOSIS — G4700 Insomnia, unspecified: Secondary | ICD-10-CM | POA: Diagnosis not present

## 2017-08-31 DIAGNOSIS — R278 Other lack of coordination: Secondary | ICD-10-CM | POA: Diagnosis not present

## 2017-08-31 DIAGNOSIS — E039 Hypothyroidism, unspecified: Secondary | ICD-10-CM | POA: Diagnosis not present

## 2017-08-31 DIAGNOSIS — I10 Essential (primary) hypertension: Secondary | ICD-10-CM | POA: Diagnosis not present

## 2017-09-01 DIAGNOSIS — I10 Essential (primary) hypertension: Secondary | ICD-10-CM | POA: Diagnosis not present

## 2017-09-01 DIAGNOSIS — E039 Hypothyroidism, unspecified: Secondary | ICD-10-CM | POA: Diagnosis not present

## 2017-09-01 DIAGNOSIS — E785 Hyperlipidemia, unspecified: Secondary | ICD-10-CM | POA: Diagnosis not present

## 2017-09-01 DIAGNOSIS — G308 Other Alzheimer's disease: Secondary | ICD-10-CM | POA: Diagnosis not present

## 2017-09-01 DIAGNOSIS — R278 Other lack of coordination: Secondary | ICD-10-CM | POA: Diagnosis not present

## 2017-09-01 DIAGNOSIS — E119 Type 2 diabetes mellitus without complications: Secondary | ICD-10-CM | POA: Diagnosis not present

## 2017-09-01 DIAGNOSIS — M6281 Muscle weakness (generalized): Secondary | ICD-10-CM | POA: Diagnosis not present

## 2017-09-01 DIAGNOSIS — G4700 Insomnia, unspecified: Secondary | ICD-10-CM | POA: Diagnosis not present

## 2017-09-01 DIAGNOSIS — K219 Gastro-esophageal reflux disease without esophagitis: Secondary | ICD-10-CM | POA: Diagnosis not present

## 2017-09-02 DIAGNOSIS — M6281 Muscle weakness (generalized): Secondary | ICD-10-CM | POA: Diagnosis not present

## 2017-09-02 DIAGNOSIS — E785 Hyperlipidemia, unspecified: Secondary | ICD-10-CM | POA: Diagnosis not present

## 2017-09-02 DIAGNOSIS — R278 Other lack of coordination: Secondary | ICD-10-CM | POA: Diagnosis not present

## 2017-09-02 DIAGNOSIS — E039 Hypothyroidism, unspecified: Secondary | ICD-10-CM | POA: Diagnosis not present

## 2017-09-02 DIAGNOSIS — K219 Gastro-esophageal reflux disease without esophagitis: Secondary | ICD-10-CM | POA: Diagnosis not present

## 2017-09-02 DIAGNOSIS — I10 Essential (primary) hypertension: Secondary | ICD-10-CM | POA: Diagnosis not present

## 2017-09-02 DIAGNOSIS — G308 Other Alzheimer's disease: Secondary | ICD-10-CM | POA: Diagnosis not present

## 2017-09-02 DIAGNOSIS — G4700 Insomnia, unspecified: Secondary | ICD-10-CM | POA: Diagnosis not present

## 2017-09-02 DIAGNOSIS — E119 Type 2 diabetes mellitus without complications: Secondary | ICD-10-CM | POA: Diagnosis not present

## 2017-10-20 DIAGNOSIS — G309 Alzheimer's disease, unspecified: Secondary | ICD-10-CM | POA: Diagnosis not present

## 2017-10-20 DIAGNOSIS — G459 Transient cerebral ischemic attack, unspecified: Secondary | ICD-10-CM | POA: Diagnosis not present

## 2017-10-20 DIAGNOSIS — E785 Hyperlipidemia, unspecified: Secondary | ICD-10-CM | POA: Diagnosis not present

## 2017-10-20 DIAGNOSIS — E119 Type 2 diabetes mellitus without complications: Secondary | ICD-10-CM | POA: Diagnosis not present

## 2017-10-22 DIAGNOSIS — E119 Type 2 diabetes mellitus without complications: Secondary | ICD-10-CM | POA: Diagnosis not present

## 2017-11-06 DIAGNOSIS — G300 Alzheimer's disease with early onset: Secondary | ICD-10-CM | POA: Diagnosis not present

## 2017-12-02 DIAGNOSIS — I10 Essential (primary) hypertension: Secondary | ICD-10-CM | POA: Diagnosis not present

## 2017-12-02 DIAGNOSIS — E039 Hypothyroidism, unspecified: Secondary | ICD-10-CM | POA: Diagnosis not present

## 2017-12-07 DIAGNOSIS — G300 Alzheimer's disease with early onset: Secondary | ICD-10-CM | POA: Diagnosis not present

## 2017-12-15 DIAGNOSIS — G459 Transient cerebral ischemic attack, unspecified: Secondary | ICD-10-CM | POA: Diagnosis not present

## 2017-12-15 DIAGNOSIS — G309 Alzheimer's disease, unspecified: Secondary | ICD-10-CM | POA: Diagnosis not present

## 2017-12-15 DIAGNOSIS — E119 Type 2 diabetes mellitus without complications: Secondary | ICD-10-CM | POA: Diagnosis not present

## 2017-12-15 DIAGNOSIS — E785 Hyperlipidemia, unspecified: Secondary | ICD-10-CM | POA: Diagnosis not present

## 2017-12-16 DIAGNOSIS — I10 Essential (primary) hypertension: Secondary | ICD-10-CM | POA: Diagnosis not present

## 2017-12-16 DIAGNOSIS — E039 Hypothyroidism, unspecified: Secondary | ICD-10-CM | POA: Diagnosis not present

## 2017-12-21 DIAGNOSIS — J449 Chronic obstructive pulmonary disease, unspecified: Secondary | ICD-10-CM | POA: Diagnosis not present

## 2017-12-21 DIAGNOSIS — G309 Alzheimer's disease, unspecified: Secondary | ICD-10-CM | POA: Diagnosis not present

## 2017-12-21 DIAGNOSIS — I10 Essential (primary) hypertension: Secondary | ICD-10-CM | POA: Diagnosis not present

## 2017-12-21 DIAGNOSIS — E119 Type 2 diabetes mellitus without complications: Secondary | ICD-10-CM | POA: Diagnosis not present

## 2017-12-23 DIAGNOSIS — R278 Other lack of coordination: Secondary | ICD-10-CM | POA: Diagnosis not present

## 2017-12-23 DIAGNOSIS — E119 Type 2 diabetes mellitus without complications: Secondary | ICD-10-CM | POA: Diagnosis not present

## 2017-12-23 DIAGNOSIS — G309 Alzheimer's disease, unspecified: Secondary | ICD-10-CM | POA: Diagnosis not present

## 2017-12-23 DIAGNOSIS — G308 Other Alzheimer's disease: Secondary | ICD-10-CM | POA: Diagnosis not present

## 2017-12-23 DIAGNOSIS — I10 Essential (primary) hypertension: Secondary | ICD-10-CM | POA: Diagnosis not present

## 2017-12-23 DIAGNOSIS — R1319 Other dysphagia: Secondary | ICD-10-CM | POA: Diagnosis not present

## 2017-12-23 DIAGNOSIS — G4700 Insomnia, unspecified: Secondary | ICD-10-CM | POA: Diagnosis not present

## 2017-12-23 DIAGNOSIS — E785 Hyperlipidemia, unspecified: Secondary | ICD-10-CM | POA: Diagnosis not present

## 2017-12-23 DIAGNOSIS — E039 Hypothyroidism, unspecified: Secondary | ICD-10-CM | POA: Diagnosis not present

## 2017-12-23 DIAGNOSIS — K219 Gastro-esophageal reflux disease without esophagitis: Secondary | ICD-10-CM | POA: Diagnosis not present

## 2017-12-23 DIAGNOSIS — M6281 Muscle weakness (generalized): Secondary | ICD-10-CM | POA: Diagnosis not present

## 2017-12-24 DIAGNOSIS — I10 Essential (primary) hypertension: Secondary | ICD-10-CM | POA: Diagnosis not present

## 2017-12-24 DIAGNOSIS — K219 Gastro-esophageal reflux disease without esophagitis: Secondary | ICD-10-CM | POA: Diagnosis not present

## 2017-12-24 DIAGNOSIS — E119 Type 2 diabetes mellitus without complications: Secondary | ICD-10-CM | POA: Diagnosis not present

## 2017-12-24 DIAGNOSIS — M6281 Muscle weakness (generalized): Secondary | ICD-10-CM | POA: Diagnosis not present

## 2017-12-24 DIAGNOSIS — E785 Hyperlipidemia, unspecified: Secondary | ICD-10-CM | POA: Diagnosis not present

## 2017-12-24 DIAGNOSIS — G309 Alzheimer's disease, unspecified: Secondary | ICD-10-CM | POA: Diagnosis not present

## 2017-12-24 DIAGNOSIS — E039 Hypothyroidism, unspecified: Secondary | ICD-10-CM | POA: Diagnosis not present

## 2017-12-24 DIAGNOSIS — G4700 Insomnia, unspecified: Secondary | ICD-10-CM | POA: Diagnosis not present

## 2017-12-24 DIAGNOSIS — R278 Other lack of coordination: Secondary | ICD-10-CM | POA: Diagnosis not present

## 2017-12-24 DIAGNOSIS — G308 Other Alzheimer's disease: Secondary | ICD-10-CM | POA: Diagnosis not present

## 2017-12-24 DIAGNOSIS — R1319 Other dysphagia: Secondary | ICD-10-CM | POA: Diagnosis not present

## 2017-12-27 DIAGNOSIS — G309 Alzheimer's disease, unspecified: Secondary | ICD-10-CM | POA: Diagnosis not present

## 2017-12-27 DIAGNOSIS — R278 Other lack of coordination: Secondary | ICD-10-CM | POA: Diagnosis not present

## 2017-12-27 DIAGNOSIS — E785 Hyperlipidemia, unspecified: Secondary | ICD-10-CM | POA: Diagnosis not present

## 2017-12-27 DIAGNOSIS — E119 Type 2 diabetes mellitus without complications: Secondary | ICD-10-CM | POA: Diagnosis not present

## 2017-12-27 DIAGNOSIS — I10 Essential (primary) hypertension: Secondary | ICD-10-CM | POA: Diagnosis not present

## 2017-12-27 DIAGNOSIS — G4700 Insomnia, unspecified: Secondary | ICD-10-CM | POA: Diagnosis not present

## 2017-12-27 DIAGNOSIS — G308 Other Alzheimer's disease: Secondary | ICD-10-CM | POA: Diagnosis not present

## 2017-12-27 DIAGNOSIS — K219 Gastro-esophageal reflux disease without esophagitis: Secondary | ICD-10-CM | POA: Diagnosis not present

## 2017-12-27 DIAGNOSIS — M6281 Muscle weakness (generalized): Secondary | ICD-10-CM | POA: Diagnosis not present

## 2017-12-27 DIAGNOSIS — R1319 Other dysphagia: Secondary | ICD-10-CM | POA: Diagnosis not present

## 2017-12-27 DIAGNOSIS — E039 Hypothyroidism, unspecified: Secondary | ICD-10-CM | POA: Diagnosis not present

## 2017-12-28 DIAGNOSIS — E785 Hyperlipidemia, unspecified: Secondary | ICD-10-CM | POA: Diagnosis not present

## 2017-12-28 DIAGNOSIS — E039 Hypothyroidism, unspecified: Secondary | ICD-10-CM | POA: Diagnosis not present

## 2017-12-28 DIAGNOSIS — G308 Other Alzheimer's disease: Secondary | ICD-10-CM | POA: Diagnosis not present

## 2017-12-28 DIAGNOSIS — R1319 Other dysphagia: Secondary | ICD-10-CM | POA: Diagnosis not present

## 2017-12-28 DIAGNOSIS — R278 Other lack of coordination: Secondary | ICD-10-CM | POA: Diagnosis not present

## 2017-12-28 DIAGNOSIS — G309 Alzheimer's disease, unspecified: Secondary | ICD-10-CM | POA: Diagnosis not present

## 2017-12-28 DIAGNOSIS — M6281 Muscle weakness (generalized): Secondary | ICD-10-CM | POA: Diagnosis not present

## 2017-12-28 DIAGNOSIS — E119 Type 2 diabetes mellitus without complications: Secondary | ICD-10-CM | POA: Diagnosis not present

## 2017-12-28 DIAGNOSIS — G4700 Insomnia, unspecified: Secondary | ICD-10-CM | POA: Diagnosis not present

## 2017-12-28 DIAGNOSIS — K219 Gastro-esophageal reflux disease without esophagitis: Secondary | ICD-10-CM | POA: Diagnosis not present

## 2017-12-28 DIAGNOSIS — I10 Essential (primary) hypertension: Secondary | ICD-10-CM | POA: Diagnosis not present

## 2017-12-29 DIAGNOSIS — E119 Type 2 diabetes mellitus without complications: Secondary | ICD-10-CM | POA: Diagnosis not present

## 2017-12-29 DIAGNOSIS — I10 Essential (primary) hypertension: Secondary | ICD-10-CM | POA: Diagnosis not present

## 2017-12-29 DIAGNOSIS — G4700 Insomnia, unspecified: Secondary | ICD-10-CM | POA: Diagnosis not present

## 2017-12-29 DIAGNOSIS — E785 Hyperlipidemia, unspecified: Secondary | ICD-10-CM | POA: Diagnosis not present

## 2017-12-29 DIAGNOSIS — E039 Hypothyroidism, unspecified: Secondary | ICD-10-CM | POA: Diagnosis not present

## 2017-12-29 DIAGNOSIS — R1319 Other dysphagia: Secondary | ICD-10-CM | POA: Diagnosis not present

## 2017-12-29 DIAGNOSIS — G308 Other Alzheimer's disease: Secondary | ICD-10-CM | POA: Diagnosis not present

## 2017-12-29 DIAGNOSIS — R278 Other lack of coordination: Secondary | ICD-10-CM | POA: Diagnosis not present

## 2017-12-29 DIAGNOSIS — M6281 Muscle weakness (generalized): Secondary | ICD-10-CM | POA: Diagnosis not present

## 2017-12-29 DIAGNOSIS — G309 Alzheimer's disease, unspecified: Secondary | ICD-10-CM | POA: Diagnosis not present

## 2017-12-29 DIAGNOSIS — K219 Gastro-esophageal reflux disease without esophagitis: Secondary | ICD-10-CM | POA: Diagnosis not present

## 2017-12-30 DIAGNOSIS — G309 Alzheimer's disease, unspecified: Secondary | ICD-10-CM | POA: Diagnosis not present

## 2017-12-30 DIAGNOSIS — G4700 Insomnia, unspecified: Secondary | ICD-10-CM | POA: Diagnosis not present

## 2017-12-30 DIAGNOSIS — E039 Hypothyroidism, unspecified: Secondary | ICD-10-CM | POA: Diagnosis not present

## 2017-12-30 DIAGNOSIS — M6281 Muscle weakness (generalized): Secondary | ICD-10-CM | POA: Diagnosis not present

## 2017-12-30 DIAGNOSIS — R278 Other lack of coordination: Secondary | ICD-10-CM | POA: Diagnosis not present

## 2017-12-30 DIAGNOSIS — K219 Gastro-esophageal reflux disease without esophagitis: Secondary | ICD-10-CM | POA: Diagnosis not present

## 2017-12-30 DIAGNOSIS — E785 Hyperlipidemia, unspecified: Secondary | ICD-10-CM | POA: Diagnosis not present

## 2017-12-30 DIAGNOSIS — E119 Type 2 diabetes mellitus without complications: Secondary | ICD-10-CM | POA: Diagnosis not present

## 2017-12-30 DIAGNOSIS — G308 Other Alzheimer's disease: Secondary | ICD-10-CM | POA: Diagnosis not present

## 2017-12-30 DIAGNOSIS — R1319 Other dysphagia: Secondary | ICD-10-CM | POA: Diagnosis not present

## 2017-12-30 DIAGNOSIS — I10 Essential (primary) hypertension: Secondary | ICD-10-CM | POA: Diagnosis not present

## 2017-12-31 DIAGNOSIS — G308 Other Alzheimer's disease: Secondary | ICD-10-CM | POA: Diagnosis not present

## 2017-12-31 DIAGNOSIS — M6281 Muscle weakness (generalized): Secondary | ICD-10-CM | POA: Diagnosis not present

## 2017-12-31 DIAGNOSIS — R278 Other lack of coordination: Secondary | ICD-10-CM | POA: Diagnosis not present

## 2017-12-31 DIAGNOSIS — E119 Type 2 diabetes mellitus without complications: Secondary | ICD-10-CM | POA: Diagnosis not present

## 2017-12-31 DIAGNOSIS — G4700 Insomnia, unspecified: Secondary | ICD-10-CM | POA: Diagnosis not present

## 2017-12-31 DIAGNOSIS — K219 Gastro-esophageal reflux disease without esophagitis: Secondary | ICD-10-CM | POA: Diagnosis not present

## 2017-12-31 DIAGNOSIS — I10 Essential (primary) hypertension: Secondary | ICD-10-CM | POA: Diagnosis not present

## 2017-12-31 DIAGNOSIS — R1319 Other dysphagia: Secondary | ICD-10-CM | POA: Diagnosis not present

## 2017-12-31 DIAGNOSIS — E785 Hyperlipidemia, unspecified: Secondary | ICD-10-CM | POA: Diagnosis not present

## 2017-12-31 DIAGNOSIS — E039 Hypothyroidism, unspecified: Secondary | ICD-10-CM | POA: Diagnosis not present

## 2018-01-03 DIAGNOSIS — E119 Type 2 diabetes mellitus without complications: Secondary | ICD-10-CM | POA: Diagnosis not present

## 2018-01-03 DIAGNOSIS — M6281 Muscle weakness (generalized): Secondary | ICD-10-CM | POA: Diagnosis not present

## 2018-01-03 DIAGNOSIS — G4700 Insomnia, unspecified: Secondary | ICD-10-CM | POA: Diagnosis not present

## 2018-01-03 DIAGNOSIS — G308 Other Alzheimer's disease: Secondary | ICD-10-CM | POA: Diagnosis not present

## 2018-01-03 DIAGNOSIS — R278 Other lack of coordination: Secondary | ICD-10-CM | POA: Diagnosis not present

## 2018-01-03 DIAGNOSIS — I10 Essential (primary) hypertension: Secondary | ICD-10-CM | POA: Diagnosis not present

## 2018-01-03 DIAGNOSIS — E785 Hyperlipidemia, unspecified: Secondary | ICD-10-CM | POA: Diagnosis not present

## 2018-01-03 DIAGNOSIS — R1319 Other dysphagia: Secondary | ICD-10-CM | POA: Diagnosis not present

## 2018-01-03 DIAGNOSIS — E039 Hypothyroidism, unspecified: Secondary | ICD-10-CM | POA: Diagnosis not present

## 2018-01-03 DIAGNOSIS — K219 Gastro-esophageal reflux disease without esophagitis: Secondary | ICD-10-CM | POA: Diagnosis not present

## 2018-01-04 DIAGNOSIS — K219 Gastro-esophageal reflux disease without esophagitis: Secondary | ICD-10-CM | POA: Diagnosis not present

## 2018-01-04 DIAGNOSIS — M6281 Muscle weakness (generalized): Secondary | ICD-10-CM | POA: Diagnosis not present

## 2018-01-04 DIAGNOSIS — R278 Other lack of coordination: Secondary | ICD-10-CM | POA: Diagnosis not present

## 2018-01-04 DIAGNOSIS — G308 Other Alzheimer's disease: Secondary | ICD-10-CM | POA: Diagnosis not present

## 2018-01-04 DIAGNOSIS — E039 Hypothyroidism, unspecified: Secondary | ICD-10-CM | POA: Diagnosis not present

## 2018-01-04 DIAGNOSIS — R1319 Other dysphagia: Secondary | ICD-10-CM | POA: Diagnosis not present

## 2018-01-04 DIAGNOSIS — G4700 Insomnia, unspecified: Secondary | ICD-10-CM | POA: Diagnosis not present

## 2018-01-04 DIAGNOSIS — E785 Hyperlipidemia, unspecified: Secondary | ICD-10-CM | POA: Diagnosis not present

## 2018-01-04 DIAGNOSIS — E119 Type 2 diabetes mellitus without complications: Secondary | ICD-10-CM | POA: Diagnosis not present

## 2018-01-04 DIAGNOSIS — I10 Essential (primary) hypertension: Secondary | ICD-10-CM | POA: Diagnosis not present

## 2018-01-05 DIAGNOSIS — R278 Other lack of coordination: Secondary | ICD-10-CM | POA: Diagnosis not present

## 2018-01-05 DIAGNOSIS — I10 Essential (primary) hypertension: Secondary | ICD-10-CM | POA: Diagnosis not present

## 2018-01-05 DIAGNOSIS — K219 Gastro-esophageal reflux disease without esophagitis: Secondary | ICD-10-CM | POA: Diagnosis not present

## 2018-01-05 DIAGNOSIS — G4700 Insomnia, unspecified: Secondary | ICD-10-CM | POA: Diagnosis not present

## 2018-01-05 DIAGNOSIS — R1319 Other dysphagia: Secondary | ICD-10-CM | POA: Diagnosis not present

## 2018-01-05 DIAGNOSIS — E039 Hypothyroidism, unspecified: Secondary | ICD-10-CM | POA: Diagnosis not present

## 2018-01-05 DIAGNOSIS — M6281 Muscle weakness (generalized): Secondary | ICD-10-CM | POA: Diagnosis not present

## 2018-01-05 DIAGNOSIS — G308 Other Alzheimer's disease: Secondary | ICD-10-CM | POA: Diagnosis not present

## 2018-01-05 DIAGNOSIS — E119 Type 2 diabetes mellitus without complications: Secondary | ICD-10-CM | POA: Diagnosis not present

## 2018-01-05 DIAGNOSIS — E785 Hyperlipidemia, unspecified: Secondary | ICD-10-CM | POA: Diagnosis not present

## 2018-01-07 DIAGNOSIS — R1319 Other dysphagia: Secondary | ICD-10-CM | POA: Diagnosis not present

## 2018-01-07 DIAGNOSIS — E119 Type 2 diabetes mellitus without complications: Secondary | ICD-10-CM | POA: Diagnosis not present

## 2018-01-07 DIAGNOSIS — G300 Alzheimer's disease with early onset: Secondary | ICD-10-CM | POA: Diagnosis not present

## 2018-01-07 DIAGNOSIS — R278 Other lack of coordination: Secondary | ICD-10-CM | POA: Diagnosis not present

## 2018-01-07 DIAGNOSIS — M6281 Muscle weakness (generalized): Secondary | ICD-10-CM | POA: Diagnosis not present

## 2018-01-07 DIAGNOSIS — E039 Hypothyroidism, unspecified: Secondary | ICD-10-CM | POA: Diagnosis not present

## 2018-01-07 DIAGNOSIS — G4700 Insomnia, unspecified: Secondary | ICD-10-CM | POA: Diagnosis not present

## 2018-01-07 DIAGNOSIS — G308 Other Alzheimer's disease: Secondary | ICD-10-CM | POA: Diagnosis not present

## 2018-01-07 DIAGNOSIS — E785 Hyperlipidemia, unspecified: Secondary | ICD-10-CM | POA: Diagnosis not present

## 2018-01-07 DIAGNOSIS — I10 Essential (primary) hypertension: Secondary | ICD-10-CM | POA: Diagnosis not present

## 2018-01-07 DIAGNOSIS — K219 Gastro-esophageal reflux disease without esophagitis: Secondary | ICD-10-CM | POA: Diagnosis not present

## 2018-01-09 DIAGNOSIS — E785 Hyperlipidemia, unspecified: Secondary | ICD-10-CM | POA: Diagnosis not present

## 2018-01-09 DIAGNOSIS — E039 Hypothyroidism, unspecified: Secondary | ICD-10-CM | POA: Diagnosis not present

## 2018-01-09 DIAGNOSIS — R278 Other lack of coordination: Secondary | ICD-10-CM | POA: Diagnosis not present

## 2018-01-09 DIAGNOSIS — E119 Type 2 diabetes mellitus without complications: Secondary | ICD-10-CM | POA: Diagnosis not present

## 2018-01-09 DIAGNOSIS — G308 Other Alzheimer's disease: Secondary | ICD-10-CM | POA: Diagnosis not present

## 2018-01-09 DIAGNOSIS — I10 Essential (primary) hypertension: Secondary | ICD-10-CM | POA: Diagnosis not present

## 2018-01-09 DIAGNOSIS — R1319 Other dysphagia: Secondary | ICD-10-CM | POA: Diagnosis not present

## 2018-01-09 DIAGNOSIS — M6281 Muscle weakness (generalized): Secondary | ICD-10-CM | POA: Diagnosis not present

## 2018-01-09 DIAGNOSIS — K219 Gastro-esophageal reflux disease without esophagitis: Secondary | ICD-10-CM | POA: Diagnosis not present

## 2018-01-09 DIAGNOSIS — G4700 Insomnia, unspecified: Secondary | ICD-10-CM | POA: Diagnosis not present

## 2018-01-10 DIAGNOSIS — M6281 Muscle weakness (generalized): Secondary | ICD-10-CM | POA: Diagnosis not present

## 2018-01-10 DIAGNOSIS — G4700 Insomnia, unspecified: Secondary | ICD-10-CM | POA: Diagnosis not present

## 2018-01-10 DIAGNOSIS — K219 Gastro-esophageal reflux disease without esophagitis: Secondary | ICD-10-CM | POA: Diagnosis not present

## 2018-01-10 DIAGNOSIS — E119 Type 2 diabetes mellitus without complications: Secondary | ICD-10-CM | POA: Diagnosis not present

## 2018-01-10 DIAGNOSIS — E785 Hyperlipidemia, unspecified: Secondary | ICD-10-CM | POA: Diagnosis not present

## 2018-01-10 DIAGNOSIS — E039 Hypothyroidism, unspecified: Secondary | ICD-10-CM | POA: Diagnosis not present

## 2018-01-10 DIAGNOSIS — R1319 Other dysphagia: Secondary | ICD-10-CM | POA: Diagnosis not present

## 2018-01-10 DIAGNOSIS — R278 Other lack of coordination: Secondary | ICD-10-CM | POA: Diagnosis not present

## 2018-01-10 DIAGNOSIS — G308 Other Alzheimer's disease: Secondary | ICD-10-CM | POA: Diagnosis not present

## 2018-01-10 DIAGNOSIS — I10 Essential (primary) hypertension: Secondary | ICD-10-CM | POA: Diagnosis not present

## 2018-01-11 DIAGNOSIS — K219 Gastro-esophageal reflux disease without esophagitis: Secondary | ICD-10-CM | POA: Diagnosis not present

## 2018-01-11 DIAGNOSIS — G4700 Insomnia, unspecified: Secondary | ICD-10-CM | POA: Diagnosis not present

## 2018-01-11 DIAGNOSIS — M6281 Muscle weakness (generalized): Secondary | ICD-10-CM | POA: Diagnosis not present

## 2018-01-11 DIAGNOSIS — E785 Hyperlipidemia, unspecified: Secondary | ICD-10-CM | POA: Diagnosis not present

## 2018-01-11 DIAGNOSIS — G308 Other Alzheimer's disease: Secondary | ICD-10-CM | POA: Diagnosis not present

## 2018-01-11 DIAGNOSIS — I10 Essential (primary) hypertension: Secondary | ICD-10-CM | POA: Diagnosis not present

## 2018-01-11 DIAGNOSIS — R278 Other lack of coordination: Secondary | ICD-10-CM | POA: Diagnosis not present

## 2018-01-11 DIAGNOSIS — E119 Type 2 diabetes mellitus without complications: Secondary | ICD-10-CM | POA: Diagnosis not present

## 2018-01-11 DIAGNOSIS — R1319 Other dysphagia: Secondary | ICD-10-CM | POA: Diagnosis not present

## 2018-01-11 DIAGNOSIS — E039 Hypothyroidism, unspecified: Secondary | ICD-10-CM | POA: Diagnosis not present

## 2018-01-12 DIAGNOSIS — E039 Hypothyroidism, unspecified: Secondary | ICD-10-CM | POA: Diagnosis not present

## 2018-01-12 DIAGNOSIS — E119 Type 2 diabetes mellitus without complications: Secondary | ICD-10-CM | POA: Diagnosis not present

## 2018-01-12 DIAGNOSIS — M6281 Muscle weakness (generalized): Secondary | ICD-10-CM | POA: Diagnosis not present

## 2018-01-12 DIAGNOSIS — R1319 Other dysphagia: Secondary | ICD-10-CM | POA: Diagnosis not present

## 2018-01-12 DIAGNOSIS — G308 Other Alzheimer's disease: Secondary | ICD-10-CM | POA: Diagnosis not present

## 2018-01-12 DIAGNOSIS — K219 Gastro-esophageal reflux disease without esophagitis: Secondary | ICD-10-CM | POA: Diagnosis not present

## 2018-01-12 DIAGNOSIS — E785 Hyperlipidemia, unspecified: Secondary | ICD-10-CM | POA: Diagnosis not present

## 2018-01-12 DIAGNOSIS — R278 Other lack of coordination: Secondary | ICD-10-CM | POA: Diagnosis not present

## 2018-01-12 DIAGNOSIS — G4700 Insomnia, unspecified: Secondary | ICD-10-CM | POA: Diagnosis not present

## 2018-01-12 DIAGNOSIS — I10 Essential (primary) hypertension: Secondary | ICD-10-CM | POA: Diagnosis not present

## 2018-01-13 DIAGNOSIS — E119 Type 2 diabetes mellitus without complications: Secondary | ICD-10-CM | POA: Diagnosis not present

## 2018-01-13 DIAGNOSIS — R1319 Other dysphagia: Secondary | ICD-10-CM | POA: Diagnosis not present

## 2018-01-13 DIAGNOSIS — K219 Gastro-esophageal reflux disease without esophagitis: Secondary | ICD-10-CM | POA: Diagnosis not present

## 2018-01-13 DIAGNOSIS — R278 Other lack of coordination: Secondary | ICD-10-CM | POA: Diagnosis not present

## 2018-01-13 DIAGNOSIS — E039 Hypothyroidism, unspecified: Secondary | ICD-10-CM | POA: Diagnosis not present

## 2018-01-13 DIAGNOSIS — E785 Hyperlipidemia, unspecified: Secondary | ICD-10-CM | POA: Diagnosis not present

## 2018-01-13 DIAGNOSIS — M6281 Muscle weakness (generalized): Secondary | ICD-10-CM | POA: Diagnosis not present

## 2018-01-13 DIAGNOSIS — G308 Other Alzheimer's disease: Secondary | ICD-10-CM | POA: Diagnosis not present

## 2018-01-13 DIAGNOSIS — G4700 Insomnia, unspecified: Secondary | ICD-10-CM | POA: Diagnosis not present

## 2018-01-13 DIAGNOSIS — I10 Essential (primary) hypertension: Secondary | ICD-10-CM | POA: Diagnosis not present

## 2018-01-14 DIAGNOSIS — E785 Hyperlipidemia, unspecified: Secondary | ICD-10-CM | POA: Diagnosis not present

## 2018-01-14 DIAGNOSIS — G4700 Insomnia, unspecified: Secondary | ICD-10-CM | POA: Diagnosis not present

## 2018-01-14 DIAGNOSIS — G308 Other Alzheimer's disease: Secondary | ICD-10-CM | POA: Diagnosis not present

## 2018-01-14 DIAGNOSIS — M6281 Muscle weakness (generalized): Secondary | ICD-10-CM | POA: Diagnosis not present

## 2018-01-14 DIAGNOSIS — K219 Gastro-esophageal reflux disease without esophagitis: Secondary | ICD-10-CM | POA: Diagnosis not present

## 2018-01-14 DIAGNOSIS — E119 Type 2 diabetes mellitus without complications: Secondary | ICD-10-CM | POA: Diagnosis not present

## 2018-01-14 DIAGNOSIS — I10 Essential (primary) hypertension: Secondary | ICD-10-CM | POA: Diagnosis not present

## 2018-01-14 DIAGNOSIS — R278 Other lack of coordination: Secondary | ICD-10-CM | POA: Diagnosis not present

## 2018-01-14 DIAGNOSIS — E039 Hypothyroidism, unspecified: Secondary | ICD-10-CM | POA: Diagnosis not present

## 2018-01-14 DIAGNOSIS — R1319 Other dysphagia: Secondary | ICD-10-CM | POA: Diagnosis not present

## 2018-02-02 DIAGNOSIS — E785 Hyperlipidemia, unspecified: Secondary | ICD-10-CM | POA: Diagnosis not present

## 2018-02-02 DIAGNOSIS — G309 Alzheimer's disease, unspecified: Secondary | ICD-10-CM | POA: Diagnosis not present

## 2018-02-02 DIAGNOSIS — E119 Type 2 diabetes mellitus without complications: Secondary | ICD-10-CM | POA: Diagnosis not present

## 2018-02-04 DIAGNOSIS — G300 Alzheimer's disease with early onset: Secondary | ICD-10-CM | POA: Diagnosis not present

## 2018-03-02 DIAGNOSIS — G4700 Insomnia, unspecified: Secondary | ICD-10-CM | POA: Diagnosis not present

## 2018-03-02 DIAGNOSIS — I10 Essential (primary) hypertension: Secondary | ICD-10-CM | POA: Diagnosis not present

## 2018-03-02 DIAGNOSIS — M6281 Muscle weakness (generalized): Secondary | ICD-10-CM | POA: Diagnosis not present

## 2018-03-02 DIAGNOSIS — K219 Gastro-esophageal reflux disease without esophagitis: Secondary | ICD-10-CM | POA: Diagnosis not present

## 2018-03-02 DIAGNOSIS — R278 Other lack of coordination: Secondary | ICD-10-CM | POA: Diagnosis not present

## 2018-03-02 DIAGNOSIS — G309 Alzheimer's disease, unspecified: Secondary | ICD-10-CM | POA: Diagnosis not present

## 2018-03-02 DIAGNOSIS — E785 Hyperlipidemia, unspecified: Secondary | ICD-10-CM | POA: Diagnosis not present

## 2018-03-02 DIAGNOSIS — E119 Type 2 diabetes mellitus without complications: Secondary | ICD-10-CM | POA: Diagnosis not present

## 2018-03-02 DIAGNOSIS — E039 Hypothyroidism, unspecified: Secondary | ICD-10-CM | POA: Diagnosis not present

## 2018-03-03 DIAGNOSIS — G4700 Insomnia, unspecified: Secondary | ICD-10-CM | POA: Diagnosis not present

## 2018-03-03 DIAGNOSIS — K219 Gastro-esophageal reflux disease without esophagitis: Secondary | ICD-10-CM | POA: Diagnosis not present

## 2018-03-03 DIAGNOSIS — I10 Essential (primary) hypertension: Secondary | ICD-10-CM | POA: Diagnosis not present

## 2018-03-03 DIAGNOSIS — E039 Hypothyroidism, unspecified: Secondary | ICD-10-CM | POA: Diagnosis not present

## 2018-03-03 DIAGNOSIS — E119 Type 2 diabetes mellitus without complications: Secondary | ICD-10-CM | POA: Diagnosis not present

## 2018-03-03 DIAGNOSIS — M6281 Muscle weakness (generalized): Secondary | ICD-10-CM | POA: Diagnosis not present

## 2018-03-03 DIAGNOSIS — G309 Alzheimer's disease, unspecified: Secondary | ICD-10-CM | POA: Diagnosis not present

## 2018-03-03 DIAGNOSIS — E785 Hyperlipidemia, unspecified: Secondary | ICD-10-CM | POA: Diagnosis not present

## 2018-03-03 DIAGNOSIS — R278 Other lack of coordination: Secondary | ICD-10-CM | POA: Diagnosis not present

## 2018-03-04 DIAGNOSIS — E039 Hypothyroidism, unspecified: Secondary | ICD-10-CM | POA: Diagnosis not present

## 2018-03-04 DIAGNOSIS — R278 Other lack of coordination: Secondary | ICD-10-CM | POA: Diagnosis not present

## 2018-03-04 DIAGNOSIS — K219 Gastro-esophageal reflux disease without esophagitis: Secondary | ICD-10-CM | POA: Diagnosis not present

## 2018-03-04 DIAGNOSIS — G309 Alzheimer's disease, unspecified: Secondary | ICD-10-CM | POA: Diagnosis not present

## 2018-03-04 DIAGNOSIS — E119 Type 2 diabetes mellitus without complications: Secondary | ICD-10-CM | POA: Diagnosis not present

## 2018-03-04 DIAGNOSIS — G4700 Insomnia, unspecified: Secondary | ICD-10-CM | POA: Diagnosis not present

## 2018-03-04 DIAGNOSIS — E785 Hyperlipidemia, unspecified: Secondary | ICD-10-CM | POA: Diagnosis not present

## 2018-03-04 DIAGNOSIS — M6281 Muscle weakness (generalized): Secondary | ICD-10-CM | POA: Diagnosis not present

## 2018-03-04 DIAGNOSIS — I10 Essential (primary) hypertension: Secondary | ICD-10-CM | POA: Diagnosis not present

## 2018-03-07 DIAGNOSIS — E785 Hyperlipidemia, unspecified: Secondary | ICD-10-CM | POA: Diagnosis not present

## 2018-03-07 DIAGNOSIS — E119 Type 2 diabetes mellitus without complications: Secondary | ICD-10-CM | POA: Diagnosis not present

## 2018-03-07 DIAGNOSIS — M6281 Muscle weakness (generalized): Secondary | ICD-10-CM | POA: Diagnosis not present

## 2018-03-07 DIAGNOSIS — R278 Other lack of coordination: Secondary | ICD-10-CM | POA: Diagnosis not present

## 2018-03-07 DIAGNOSIS — G309 Alzheimer's disease, unspecified: Secondary | ICD-10-CM | POA: Diagnosis not present

## 2018-03-07 DIAGNOSIS — G4700 Insomnia, unspecified: Secondary | ICD-10-CM | POA: Diagnosis not present

## 2018-03-07 DIAGNOSIS — I10 Essential (primary) hypertension: Secondary | ICD-10-CM | POA: Diagnosis not present

## 2018-03-07 DIAGNOSIS — K219 Gastro-esophageal reflux disease without esophagitis: Secondary | ICD-10-CM | POA: Diagnosis not present

## 2018-03-07 DIAGNOSIS — E039 Hypothyroidism, unspecified: Secondary | ICD-10-CM | POA: Diagnosis not present

## 2018-03-08 DIAGNOSIS — E039 Hypothyroidism, unspecified: Secondary | ICD-10-CM | POA: Diagnosis not present

## 2018-03-08 DIAGNOSIS — I10 Essential (primary) hypertension: Secondary | ICD-10-CM | POA: Diagnosis not present

## 2018-03-08 DIAGNOSIS — G309 Alzheimer's disease, unspecified: Secondary | ICD-10-CM | POA: Diagnosis not present

## 2018-03-08 DIAGNOSIS — R278 Other lack of coordination: Secondary | ICD-10-CM | POA: Diagnosis not present

## 2018-03-08 DIAGNOSIS — G4700 Insomnia, unspecified: Secondary | ICD-10-CM | POA: Diagnosis not present

## 2018-03-08 DIAGNOSIS — E785 Hyperlipidemia, unspecified: Secondary | ICD-10-CM | POA: Diagnosis not present

## 2018-03-08 DIAGNOSIS — E119 Type 2 diabetes mellitus without complications: Secondary | ICD-10-CM | POA: Diagnosis not present

## 2018-03-08 DIAGNOSIS — K219 Gastro-esophageal reflux disease without esophagitis: Secondary | ICD-10-CM | POA: Diagnosis not present

## 2018-03-08 DIAGNOSIS — M6281 Muscle weakness (generalized): Secondary | ICD-10-CM | POA: Diagnosis not present

## 2018-03-09 DIAGNOSIS — E119 Type 2 diabetes mellitus without complications: Secondary | ICD-10-CM | POA: Diagnosis not present

## 2018-03-09 DIAGNOSIS — K219 Gastro-esophageal reflux disease without esophagitis: Secondary | ICD-10-CM | POA: Diagnosis not present

## 2018-03-09 DIAGNOSIS — E039 Hypothyroidism, unspecified: Secondary | ICD-10-CM | POA: Diagnosis not present

## 2018-03-09 DIAGNOSIS — G309 Alzheimer's disease, unspecified: Secondary | ICD-10-CM | POA: Diagnosis not present

## 2018-03-09 DIAGNOSIS — M6281 Muscle weakness (generalized): Secondary | ICD-10-CM | POA: Diagnosis not present

## 2018-03-09 DIAGNOSIS — E785 Hyperlipidemia, unspecified: Secondary | ICD-10-CM | POA: Diagnosis not present

## 2018-03-09 DIAGNOSIS — I10 Essential (primary) hypertension: Secondary | ICD-10-CM | POA: Diagnosis not present

## 2018-03-09 DIAGNOSIS — G4700 Insomnia, unspecified: Secondary | ICD-10-CM | POA: Diagnosis not present

## 2018-03-09 DIAGNOSIS — R278 Other lack of coordination: Secondary | ICD-10-CM | POA: Diagnosis not present

## 2020-07-31 DEATH — deceased
